# Patient Record
Sex: Female | Born: 1935 | Race: White | Hispanic: No | Smoking: Never smoker
Health system: Southern US, Community
[De-identification: ages and names within clinical notes are randomized; demographics above are authoritative.]

## PROBLEM LIST (undated history)

## (undated) DIAGNOSIS — D126 Benign neoplasm of colon, unspecified: Secondary | ICD-10-CM

## (undated) DIAGNOSIS — E785 Hyperlipidemia, unspecified: Secondary | ICD-10-CM

## (undated) DIAGNOSIS — R55 Syncope and collapse: Secondary | ICD-10-CM

## (undated) DIAGNOSIS — I10 Essential (primary) hypertension: Secondary | ICD-10-CM

## (undated) DIAGNOSIS — I447 Left bundle-branch block, unspecified: Secondary | ICD-10-CM

## (undated) DIAGNOSIS — I779 Disorder of arteries and arterioles, unspecified: Secondary | ICD-10-CM

## (undated) DIAGNOSIS — I2510 Atherosclerotic heart disease of native coronary artery without angina pectoris: Secondary | ICD-10-CM

## (undated) DIAGNOSIS — M810 Age-related osteoporosis without current pathological fracture: Secondary | ICD-10-CM

## (undated) DIAGNOSIS — G459 Transient cerebral ischemic attack, unspecified: Secondary | ICD-10-CM

## (undated) DIAGNOSIS — K5730 Diverticulosis of large intestine without perforation or abscess without bleeding: Secondary | ICD-10-CM

## (undated) DIAGNOSIS — E119 Type 2 diabetes mellitus without complications: Secondary | ICD-10-CM

## (undated) DIAGNOSIS — K219 Gastro-esophageal reflux disease without esophagitis: Secondary | ICD-10-CM

## (undated) DIAGNOSIS — D649 Anemia, unspecified: Secondary | ICD-10-CM

## (undated) DIAGNOSIS — K649 Unspecified hemorrhoids: Secondary | ICD-10-CM

## (undated) DIAGNOSIS — I739 Peripheral vascular disease, unspecified: Secondary | ICD-10-CM

## (undated) DIAGNOSIS — Z9289 Personal history of other medical treatment: Secondary | ICD-10-CM

## (undated) DIAGNOSIS — W19XXXA Unspecified fall, initial encounter: Secondary | ICD-10-CM

## (undated) DIAGNOSIS — S022XXB Fracture of nasal bones, initial encounter for open fracture: Secondary | ICD-10-CM

## (undated) DIAGNOSIS — I480 Paroxysmal atrial fibrillation: Secondary | ICD-10-CM

## (undated) DIAGNOSIS — I639 Cerebral infarction, unspecified: Secondary | ICD-10-CM

## (undated) DIAGNOSIS — M1990 Unspecified osteoarthritis, unspecified site: Secondary | ICD-10-CM

## (undated) HISTORY — PX: CLOSED REDUCTION METATARSAL FRACTURE: SUR228

## (undated) HISTORY — DX: Transient cerebral ischemic attack, unspecified: G45.9

## (undated) HISTORY — DX: Hyperlipidemia, unspecified: E78.5

## (undated) HISTORY — DX: Left bundle-branch block, unspecified: I44.7

## (undated) HISTORY — DX: Paroxysmal atrial fibrillation: I48.0

## (undated) HISTORY — DX: Syncope and collapse: R55

## (undated) HISTORY — DX: Benign neoplasm of colon, unspecified: D12.6

## (undated) HISTORY — DX: Type 2 diabetes mellitus without complications: E11.9

## (undated) HISTORY — DX: Peripheral vascular disease, unspecified: I73.9

## (undated) HISTORY — DX: Cerebral infarction, unspecified: I63.9

## (undated) HISTORY — DX: Atherosclerotic heart disease of native coronary artery without angina pectoris: I25.10

## (undated) HISTORY — DX: Unspecified osteoarthritis, unspecified site: M19.90

## (undated) HISTORY — DX: Gastro-esophageal reflux disease without esophagitis: K21.9

## (undated) HISTORY — DX: Diverticulosis of large intestine without perforation or abscess without bleeding: K57.30

## (undated) HISTORY — DX: Disorder of arteries and arterioles, unspecified: I77.9

## (undated) HISTORY — DX: Age-related osteoporosis without current pathological fracture: M81.0

## (undated) HISTORY — DX: Essential (primary) hypertension: I10

## (undated) HISTORY — PX: EYE SURGERY: SHX253

## (undated) HISTORY — PX: TONSILLECTOMY: SHX5217

## (undated) HISTORY — DX: Anemia, unspecified: D64.9

## (undated) HISTORY — DX: Personal history of other medical treatment: Z92.89

## (undated) HISTORY — DX: Unspecified hemorrhoids: K64.9

---

## 1997-06-15 ENCOUNTER — Other Ambulatory Visit: Admission: RE | Admit: 1997-06-15 | Discharge: 1997-06-15 | Payer: Self-pay | Admitting: Family Medicine

## 1997-08-17 ENCOUNTER — Other Ambulatory Visit: Admission: RE | Admit: 1997-08-17 | Discharge: 1997-08-17 | Payer: Self-pay | Admitting: Family Medicine

## 1998-08-14 ENCOUNTER — Other Ambulatory Visit: Admission: RE | Admit: 1998-08-14 | Discharge: 1998-08-14 | Payer: Self-pay | Admitting: Family Medicine

## 1998-09-21 ENCOUNTER — Encounter: Admission: RE | Admit: 1998-09-21 | Discharge: 1998-12-20 | Payer: Self-pay | Admitting: Family Medicine

## 1998-12-21 ENCOUNTER — Encounter: Admission: RE | Admit: 1998-12-21 | Discharge: 1999-03-21 | Payer: Self-pay | Admitting: Family Medicine

## 1999-02-12 HISTORY — PX: COLONOSCOPY W/ POLYPECTOMY: SHX1380

## 1999-03-26 ENCOUNTER — Encounter: Admission: RE | Admit: 1999-03-26 | Discharge: 1999-06-24 | Payer: Self-pay | Admitting: Family Medicine

## 2000-03-19 ENCOUNTER — Other Ambulatory Visit: Admission: RE | Admit: 2000-03-19 | Discharge: 2000-03-19 | Payer: Self-pay | Admitting: Internal Medicine

## 2000-09-29 ENCOUNTER — Encounter: Payer: Self-pay | Admitting: Ophthalmology

## 2000-09-30 ENCOUNTER — Ambulatory Visit (HOSPITAL_COMMUNITY): Admission: RE | Admit: 2000-09-30 | Discharge: 2000-09-30 | Payer: Self-pay | Admitting: Ophthalmology

## 2001-07-16 ENCOUNTER — Encounter (INDEPENDENT_AMBULATORY_CARE_PROVIDER_SITE_OTHER): Payer: Self-pay | Admitting: *Deleted

## 2001-07-21 ENCOUNTER — Encounter: Payer: Self-pay | Admitting: Internal Medicine

## 2001-07-21 ENCOUNTER — Encounter: Admission: RE | Admit: 2001-07-21 | Discharge: 2001-07-21 | Payer: Self-pay | Admitting: Internal Medicine

## 2003-12-19 ENCOUNTER — Ambulatory Visit: Payer: Self-pay | Admitting: Internal Medicine

## 2004-01-18 ENCOUNTER — Ambulatory Visit: Payer: Self-pay | Admitting: Internal Medicine

## 2004-05-07 ENCOUNTER — Ambulatory Visit: Payer: Self-pay | Admitting: Internal Medicine

## 2004-05-07 ENCOUNTER — Ambulatory Visit: Payer: Self-pay | Admitting: Cardiology

## 2004-05-08 ENCOUNTER — Ambulatory Visit: Payer: Self-pay

## 2004-05-24 ENCOUNTER — Ambulatory Visit: Payer: Self-pay | Admitting: Internal Medicine

## 2004-07-20 ENCOUNTER — Ambulatory Visit (HOSPITAL_COMMUNITY): Admission: RE | Admit: 2004-07-20 | Discharge: 2004-07-20 | Payer: Self-pay | Admitting: Ophthalmology

## 2004-07-24 ENCOUNTER — Ambulatory Visit: Payer: Self-pay | Admitting: Internal Medicine

## 2004-08-07 ENCOUNTER — Encounter (INDEPENDENT_AMBULATORY_CARE_PROVIDER_SITE_OTHER): Payer: Self-pay | Admitting: *Deleted

## 2004-08-07 ENCOUNTER — Encounter: Payer: Self-pay | Admitting: Internal Medicine

## 2004-08-07 ENCOUNTER — Ambulatory Visit: Payer: Self-pay | Admitting: Internal Medicine

## 2004-08-07 ENCOUNTER — Ambulatory Visit (HOSPITAL_COMMUNITY): Admission: RE | Admit: 2004-08-07 | Discharge: 2004-08-07 | Payer: Self-pay | Admitting: Internal Medicine

## 2004-12-26 ENCOUNTER — Ambulatory Visit: Payer: Self-pay | Admitting: Internal Medicine

## 2005-03-06 ENCOUNTER — Ambulatory Visit: Payer: Self-pay | Admitting: Internal Medicine

## 2005-03-13 ENCOUNTER — Encounter: Admission: RE | Admit: 2005-03-13 | Discharge: 2005-06-11 | Payer: Self-pay | Admitting: Internal Medicine

## 2005-06-13 ENCOUNTER — Ambulatory Visit: Payer: Self-pay | Admitting: Internal Medicine

## 2005-08-02 ENCOUNTER — Ambulatory Visit: Payer: Self-pay | Admitting: Internal Medicine

## 2005-09-02 ENCOUNTER — Ambulatory Visit: Payer: Self-pay | Admitting: Internal Medicine

## 2005-11-26 ENCOUNTER — Ambulatory Visit: Payer: Self-pay | Admitting: Internal Medicine

## 2005-12-20 ENCOUNTER — Ambulatory Visit: Payer: Self-pay | Admitting: Internal Medicine

## 2006-01-13 ENCOUNTER — Ambulatory Visit: Payer: Self-pay | Admitting: Internal Medicine

## 2006-01-13 LAB — CONVERTED CEMR LAB
ALT: 21 units/L (ref 0–40)
AST: 27 units/L (ref 0–37)
Albumin: 3.8 g/dL (ref 3.5–5.2)
Alkaline Phosphatase: 32 units/L — ABNORMAL LOW (ref 39–117)
BUN: 18 mg/dL (ref 6–23)
Bilirubin, Direct: 0.1 mg/dL (ref 0.0–0.3)
CO2: 28 meq/L (ref 19–32)
Calcium: 9.2 mg/dL (ref 8.4–10.5)
Chloride: 108 meq/L (ref 96–112)
Chol/HDL Ratio, serum: 2.8
Cholesterol: 139 mg/dL (ref 0–200)
Creatinine, Ser: 1.1 mg/dL (ref 0.4–1.2)
Creatinine,U: 97.5 mg/dL
GFR calc non Af Amer: 50 mL/min
Glomerular Filtration Rate, Af Am: 61 mL/min/{1.73_m2}
Glucose, Bld: 118 mg/dL — ABNORMAL HIGH (ref 70–99)
HDL: 50.1 mg/dL (ref >39.0)
Hgb A1c MFr Bld: 6.5 % — ABNORMAL HIGH (ref 4.6–6.0)
LDL Cholesterol: 73 mg/dL (ref 0–99)
Microalb Creat Ratio: 17.4 mg/g (ref 0.0–30.0)
Microalb, Ur: 1.7 mg/dL (ref 0.0–1.9)
Potassium: 4.1 meq/L (ref 3.5–5.1)
Sodium: 143 meq/L (ref 135–145)
Total Bilirubin: 1 mg/dL (ref 0.3–1.2)
Total Protein: 6.8 g/dL (ref 6.0–8.3)
Triglyceride fasting, serum: 80 mg/dL (ref 0–149)
VLDL: 16 mg/dL (ref 0–40)

## 2006-03-07 ENCOUNTER — Ambulatory Visit: Payer: Self-pay | Admitting: Internal Medicine

## 2006-03-07 LAB — CONVERTED CEMR LAB
Basophils Absolute: 0 10*3/uL (ref 0.0–0.1)
Basophils Relative: 0.3 % (ref 0.0–1.0)
Eosinophils Absolute: 0.2 10*3/uL (ref 0.0–0.6)
Eosinophils Relative: 3 % (ref 0.0–5.0)
HCT: 35.7 % — ABNORMAL LOW (ref 36.0–46.0)
Hemoglobin: 12.3 g/dL (ref 12.0–15.0)
Lymphocytes Relative: 33.1 % (ref 12.0–46.0)
MCHC: 34.4 g/dL (ref 30.0–36.0)
MCV: 97.9 fL (ref 78.0–100.0)
Monocytes Absolute: 0.6 10*3/uL (ref 0.2–0.7)
Monocytes Relative: 9.4 % (ref 3.0–11.0)
Neutro Abs: 3.1 10*3/uL (ref 1.4–7.7)
Neutrophils Relative %: 54.2 % (ref 43.0–77.0)
Platelets: 209 10*3/uL (ref 150–400)
RBC: 3.65 M/uL — ABNORMAL LOW (ref 3.87–5.11)
RDW: 13 % (ref 11.5–14.6)
TSH: 3.68 microintl units/mL (ref 0.35–5.50)
WBC: 5.9 10*3/uL (ref 4.5–10.5)

## 2006-05-26 ENCOUNTER — Ambulatory Visit: Payer: Self-pay | Admitting: Internal Medicine

## 2006-05-26 LAB — CONVERTED CEMR LAB
ALT: 22 units/L (ref 0–40)
AST: 26 units/L (ref 0–37)
Albumin: 4 g/dL (ref 3.5–5.2)
Alkaline Phosphatase: 33 units/L — ABNORMAL LOW (ref 39–117)
BUN: 22 mg/dL (ref 6–23)
Bilirubin, Direct: 0.1 mg/dL (ref 0.0–0.3)
CO2: 29 meq/L (ref 19–32)
Calcium: 9.8 mg/dL (ref 8.4–10.5)
Chloride: 111 meq/L (ref 96–112)
Cholesterol: 167 mg/dL (ref 0–200)
Creatinine, Ser: 1 mg/dL (ref 0.4–1.2)
GFR calc Af Amer: 68 mL/min
GFR calc non Af Amer: 56 mL/min
Glucose, Bld: 147 mg/dL — ABNORMAL HIGH (ref 70–99)
HDL: 53.3 mg/dL (ref >39.0)
Hgb A1c MFr Bld: 6.7 % — ABNORMAL HIGH (ref 4.6–6.0)
LDL Cholesterol: 91 mg/dL (ref 0–99)
Potassium: 4.8 meq/L (ref 3.5–5.1)
Sodium: 147 meq/L — ABNORMAL HIGH (ref 135–145)
Total Bilirubin: 0.9 mg/dL (ref 0.3–1.2)
Total CHOL/HDL Ratio: 3.1
Total Protein: 7.3 g/dL (ref 6.0–8.3)
Triglycerides: 116 mg/dL (ref 0–149)
VLDL: 23 mg/dL (ref 0–40)

## 2006-06-10 ENCOUNTER — Ambulatory Visit: Payer: Self-pay | Admitting: Internal Medicine

## 2006-08-05 ENCOUNTER — Ambulatory Visit: Payer: Self-pay | Admitting: Internal Medicine

## 2006-08-05 ENCOUNTER — Inpatient Hospital Stay (HOSPITAL_COMMUNITY): Admission: AD | Admit: 2006-08-05 | Discharge: 2006-08-07 | Payer: Self-pay | Admitting: Internal Medicine

## 2006-08-06 ENCOUNTER — Ambulatory Visit: Payer: Self-pay | Admitting: Internal Medicine

## 2006-08-06 ENCOUNTER — Encounter: Payer: Self-pay | Admitting: Internal Medicine

## 2006-08-11 DIAGNOSIS — M810 Age-related osteoporosis without current pathological fracture: Secondary | ICD-10-CM

## 2006-08-11 DIAGNOSIS — E119 Type 2 diabetes mellitus without complications: Secondary | ICD-10-CM

## 2006-08-11 DIAGNOSIS — K5730 Diverticulosis of large intestine without perforation or abscess without bleeding: Secondary | ICD-10-CM | POA: Insufficient documentation

## 2006-08-11 DIAGNOSIS — M1990 Unspecified osteoarthritis, unspecified site: Secondary | ICD-10-CM

## 2006-08-11 DIAGNOSIS — I447 Left bundle-branch block, unspecified: Secondary | ICD-10-CM

## 2006-08-11 HISTORY — DX: Age-related osteoporosis without current pathological fracture: M81.0

## 2006-08-11 HISTORY — DX: Diverticulosis of large intestine without perforation or abscess without bleeding: K57.30

## 2006-08-11 HISTORY — DX: Type 2 diabetes mellitus without complications: E11.9

## 2006-08-11 HISTORY — DX: Left bundle-branch block, unspecified: I44.7

## 2006-08-11 HISTORY — DX: Unspecified osteoarthritis, unspecified site: M19.90

## 2006-08-13 ENCOUNTER — Inpatient Hospital Stay (HOSPITAL_COMMUNITY): Admission: EM | Admit: 2006-08-13 | Discharge: 2006-08-14 | Payer: Self-pay | Admitting: Emergency Medicine

## 2006-08-13 ENCOUNTER — Encounter: Payer: Self-pay | Admitting: Internal Medicine

## 2006-08-13 ENCOUNTER — Encounter (INDEPENDENT_AMBULATORY_CARE_PROVIDER_SITE_OTHER): Payer: Self-pay | Admitting: *Deleted

## 2006-08-13 ENCOUNTER — Ambulatory Visit: Payer: Self-pay | Admitting: Vascular Surgery

## 2006-08-18 ENCOUNTER — Ambulatory Visit: Payer: Self-pay | Admitting: Internal Medicine

## 2006-08-19 ENCOUNTER — Telehealth: Payer: Self-pay | Admitting: Internal Medicine

## 2006-08-21 ENCOUNTER — Encounter: Admission: RE | Admit: 2006-08-21 | Discharge: 2006-10-29 | Payer: Self-pay | Admitting: Internal Medicine

## 2006-08-27 ENCOUNTER — Encounter: Admission: RE | Admit: 2006-08-27 | Discharge: 2006-08-28 | Payer: Self-pay | Admitting: Internal Medicine

## 2006-09-02 ENCOUNTER — Encounter: Payer: Self-pay | Admitting: Internal Medicine

## 2006-09-02 ENCOUNTER — Telehealth: Payer: Self-pay | Admitting: Internal Medicine

## 2006-09-02 DIAGNOSIS — G459 Transient cerebral ischemic attack, unspecified: Secondary | ICD-10-CM

## 2006-09-02 HISTORY — DX: Transient cerebral ischemic attack, unspecified: G45.9

## 2006-09-04 ENCOUNTER — Telehealth (INDEPENDENT_AMBULATORY_CARE_PROVIDER_SITE_OTHER): Payer: Self-pay | Admitting: *Deleted

## 2006-09-04 ENCOUNTER — Encounter: Payer: Self-pay | Admitting: Internal Medicine

## 2006-09-09 ENCOUNTER — Ambulatory Visit: Payer: Self-pay | Admitting: Internal Medicine

## 2006-09-09 DIAGNOSIS — E785 Hyperlipidemia, unspecified: Secondary | ICD-10-CM

## 2006-09-09 DIAGNOSIS — I10 Essential (primary) hypertension: Secondary | ICD-10-CM

## 2006-09-09 HISTORY — DX: Hyperlipidemia, unspecified: E78.5

## 2006-09-09 HISTORY — DX: Essential (primary) hypertension: I10

## 2006-09-10 LAB — CONVERTED CEMR LAB
ALT: 25 units/L (ref 0–35)
AST: 26 units/L (ref 0–37)
Albumin: 3.9 g/dL (ref 3.5–5.2)
Alkaline Phosphatase: 31 units/L — ABNORMAL LOW (ref 39–117)
BUN: 16 mg/dL (ref 6–23)
Bilirubin, Direct: 0.1 mg/dL (ref 0.0–0.3)
CO2: 26 meq/L (ref 19–32)
Calcium: 9.4 mg/dL (ref 8.4–10.5)
Chloride: 102 meq/L (ref 96–112)
Creatinine, Ser: 1 mg/dL (ref 0.4–1.2)
GFR calc Af Amer: 68 mL/min
GFR calc non Af Amer: 56 mL/min
Glucose, Bld: 146 mg/dL — ABNORMAL HIGH (ref 70–99)
Hgb A1c MFr Bld: 6.8 % — ABNORMAL HIGH (ref 4.6–6.0)
Potassium: 4.5 meq/L (ref 3.5–5.1)
Sodium: 138 meq/L (ref 135–145)
Total Bilirubin: 1.1 mg/dL (ref 0.3–1.2)
Total Protein: 6.7 g/dL (ref 6.0–8.3)

## 2006-09-22 ENCOUNTER — Telehealth: Payer: Self-pay | Admitting: Internal Medicine

## 2006-09-25 ENCOUNTER — Ambulatory Visit: Payer: Self-pay | Admitting: Internal Medicine

## 2006-09-25 LAB — CONVERTED CEMR LAB
Cholesterol, target level: 200 mg/dL
HDL goal, serum: 40 mg/dL
LDL Goal: 70 mg/dL

## 2006-10-02 ENCOUNTER — Encounter: Payer: Self-pay | Admitting: Internal Medicine

## 2006-10-15 ENCOUNTER — Ambulatory Visit: Payer: Self-pay

## 2006-10-21 ENCOUNTER — Ambulatory Visit: Payer: Self-pay | Admitting: Cardiology

## 2006-10-24 ENCOUNTER — Ambulatory Visit: Payer: Self-pay | Admitting: Internal Medicine

## 2006-11-06 ENCOUNTER — Ambulatory Visit: Payer: Self-pay | Admitting: Cardiology

## 2006-11-20 ENCOUNTER — Telehealth: Payer: Self-pay | Admitting: Internal Medicine

## 2006-11-21 ENCOUNTER — Ambulatory Visit: Payer: Self-pay | Admitting: Internal Medicine

## 2006-12-08 ENCOUNTER — Telehealth: Payer: Self-pay | Admitting: Internal Medicine

## 2006-12-12 ENCOUNTER — Ambulatory Visit: Payer: Self-pay | Admitting: Cardiology

## 2006-12-24 ENCOUNTER — Telehealth (INDEPENDENT_AMBULATORY_CARE_PROVIDER_SITE_OTHER): Payer: Self-pay | Admitting: *Deleted

## 2007-01-15 ENCOUNTER — Ambulatory Visit: Payer: Self-pay | Admitting: Internal Medicine

## 2007-01-15 LAB — CONVERTED CEMR LAB
ALT: 26 units/L (ref 0–35)
AST: 28 units/L (ref 0–37)
Albumin: 3.8 g/dL (ref 3.5–5.2)
Alkaline Phosphatase: 36 units/L — ABNORMAL LOW (ref 39–117)
Basophils Absolute: 0 10*3/uL (ref 0.0–0.1)
Basophils Relative: 0.2 % (ref 0.0–1.0)
Bilirubin, Direct: 0.2 mg/dL (ref 0.0–0.3)
Cholesterol: 140 mg/dL (ref 0–200)
Eosinophils Absolute: 0.2 10*3/uL (ref 0.0–0.6)
Eosinophils Relative: 3.6 % (ref 0.0–5.0)
HCT: 35.8 % — ABNORMAL LOW (ref 36.0–46.0)
HDL: 39.1 mg/dL (ref >39.0)
Hemoglobin: 12.2 g/dL (ref 12.0–15.0)
Hgb A1c MFr Bld: 6.9 % — ABNORMAL HIGH (ref 4.6–6.0)
LDL Cholesterol: 83 mg/dL (ref 0–99)
Lymphocytes Relative: 33.9 % (ref 12.0–46.0)
MCHC: 34.2 g/dL (ref 30.0–36.0)
MCV: 99.1 fL (ref 78.0–100.0)
Monocytes Absolute: 0.7 10*3/uL (ref 0.2–0.7)
Monocytes Relative: 10.3 % (ref 3.0–11.0)
Neutro Abs: 3.7 10*3/uL (ref 1.4–7.7)
Neutrophils Relative %: 52 % (ref 43.0–77.0)
Platelets: 246 10*3/uL (ref 150–400)
RBC: 3.61 M/uL — ABNORMAL LOW (ref 3.87–5.11)
RDW: 12.8 % (ref 11.5–14.6)
Total Bilirubin: 0.8 mg/dL (ref 0.3–1.2)
Total CHOL/HDL Ratio: 3.6
Total Protein: 6.7 g/dL (ref 6.0–8.3)
Triglycerides: 88 mg/dL (ref 0–149)
VLDL: 18 mg/dL (ref 0–40)
WBC: 6.9 10*3/uL (ref 4.5–10.5)

## 2007-02-16 ENCOUNTER — Ambulatory Visit: Payer: Self-pay | Admitting: Internal Medicine

## 2007-02-16 DIAGNOSIS — K219 Gastro-esophageal reflux disease without esophagitis: Secondary | ICD-10-CM

## 2007-02-16 HISTORY — DX: Gastro-esophageal reflux disease without esophagitis: K21.9

## 2007-02-27 ENCOUNTER — Telehealth: Payer: Self-pay | Admitting: Internal Medicine

## 2007-03-03 ENCOUNTER — Ambulatory Visit: Payer: Self-pay | Admitting: Cardiology

## 2007-05-12 ENCOUNTER — Ambulatory Visit: Payer: Self-pay | Admitting: Internal Medicine

## 2007-05-12 LAB — CONVERTED CEMR LAB
CO2: 29 meq/L (ref 19–32)
Chloride: 107 meq/L (ref 96–112)
GFR calc non Af Amer: 50 mL/min
Hgb A1c MFr Bld: 6.9 % — ABNORMAL HIGH (ref 4.6–6.0)
LDL Cholesterol: 73 mg/dL (ref 0–99)
Potassium: 4.7 meq/L (ref 3.5–5.1)
Sodium: 142 meq/L (ref 135–145)
TSH: 4.09 microintl units/mL (ref 0.35–5.50)
Total CHOL/HDL Ratio: 3.6
Vitamin B-12: 413 pg/mL (ref 211–911)

## 2007-05-14 ENCOUNTER — Ambulatory Visit: Payer: Self-pay | Admitting: Internal Medicine

## 2007-06-08 ENCOUNTER — Telehealth: Payer: Self-pay | Admitting: Internal Medicine

## 2007-06-12 ENCOUNTER — Ambulatory Visit: Payer: Self-pay | Admitting: Internal Medicine

## 2007-06-15 ENCOUNTER — Ambulatory Visit: Payer: Self-pay | Admitting: Internal Medicine

## 2007-06-22 ENCOUNTER — Ambulatory Visit: Payer: Self-pay | Admitting: Internal Medicine

## 2007-06-23 ENCOUNTER — Telehealth: Payer: Self-pay | Admitting: Internal Medicine

## 2007-06-23 LAB — CONVERTED CEMR LAB
AST: 36 units/L (ref 0–37)
Albumin: 3.6 g/dL (ref 3.5–5.2)
Alkaline Phosphatase: 34 units/L — ABNORMAL LOW (ref 39–117)
BUN: 20 mg/dL (ref 6–23)
CO2: 29 meq/L (ref 19–32)
Chloride: 107 meq/L (ref 96–112)
Eosinophils Absolute: 0.1 10*3/uL (ref 0.0–0.7)
Eosinophils Relative: 0.6 % (ref 0.0–5.0)
GFR calc non Af Amer: 56 mL/min
Lymphocytes Relative: 15.2 % (ref 12.0–46.0)
MCV: 98.5 fL (ref 78.0–100.0)
Monocytes Relative: 9.6 % (ref 3.0–12.0)
Neutrophils Relative %: 74.5 % (ref 43.0–77.0)
Platelets: 239 10*3/uL (ref 150–400)
Potassium: 4.9 meq/L (ref 3.5–5.1)
WBC: 11.1 10*3/uL — ABNORMAL HIGH (ref 4.5–10.5)

## 2007-07-23 ENCOUNTER — Encounter: Payer: Self-pay | Admitting: Internal Medicine

## 2007-08-18 ENCOUNTER — Ambulatory Visit: Payer: Self-pay

## 2007-08-25 ENCOUNTER — Ambulatory Visit: Payer: Self-pay | Admitting: Cardiology

## 2007-09-07 ENCOUNTER — Ambulatory Visit: Payer: Self-pay | Admitting: Internal Medicine

## 2007-09-07 LAB — CONVERTED CEMR LAB
Bilirubin, Direct: 0.1 mg/dL (ref 0.0–0.3)
CO2: 27 meq/L (ref 19–32)
Calcium: 9.5 mg/dL (ref 8.4–10.5)
GFR calc Af Amer: 68 mL/min
HDL: 42.8 mg/dL (ref >39.0)
Sodium: 143 meq/L (ref 135–145)
Total Bilirubin: 1 mg/dL (ref 0.3–1.2)
Total CHOL/HDL Ratio: 2.9
VLDL: 23 mg/dL (ref 0–40)

## 2007-09-14 ENCOUNTER — Ambulatory Visit: Payer: Self-pay | Admitting: Internal Medicine

## 2007-09-29 ENCOUNTER — Encounter: Payer: Self-pay | Admitting: Internal Medicine

## 2007-10-14 ENCOUNTER — Ambulatory Visit: Payer: Self-pay | Admitting: Family Medicine

## 2007-10-14 ENCOUNTER — Encounter: Payer: Self-pay | Admitting: Internal Medicine

## 2007-10-27 ENCOUNTER — Ambulatory Visit: Payer: Self-pay | Admitting: Cardiology

## 2007-11-17 ENCOUNTER — Ambulatory Visit: Payer: Self-pay | Admitting: Internal Medicine

## 2007-12-11 ENCOUNTER — Ambulatory Visit: Payer: Self-pay | Admitting: Internal Medicine

## 2007-12-11 LAB — CONVERTED CEMR LAB
AST: 27 units/L (ref 0–37)
Albumin: 3.7 g/dL (ref 3.5–5.2)
BUN: 17 mg/dL (ref 6–23)
Cholesterol: 138 mg/dL (ref 0–200)
Creatinine, Ser: 1 mg/dL (ref 0.4–1.2)
GFR calc Af Amer: 68 mL/min
GFR calc non Af Amer: 56 mL/min
Hgb A1c MFr Bld: 6.8 % — ABNORMAL HIGH (ref 4.6–6.0)
LDL Cholesterol: 77 mg/dL (ref 0–99)
Triglycerides: 90 mg/dL (ref 0–149)
VLDL: 18 mg/dL (ref 0–40)

## 2007-12-17 ENCOUNTER — Ambulatory Visit: Payer: Self-pay | Admitting: Internal Medicine

## 2008-01-28 ENCOUNTER — Ambulatory Visit: Payer: Self-pay | Admitting: Cardiology

## 2008-04-07 ENCOUNTER — Encounter: Payer: Self-pay | Admitting: Internal Medicine

## 2008-04-07 ENCOUNTER — Ambulatory Visit: Payer: Self-pay | Admitting: Internal Medicine

## 2008-04-07 LAB — CONVERTED CEMR LAB
ALT: 25 units/L (ref 0–35)
AST: 28 units/L (ref 0–37)
Bilirubin, Direct: 0.1 mg/dL (ref 0.0–0.3)
CO2: 29 meq/L (ref 19–32)
Calcium: 9.2 mg/dL (ref 8.4–10.5)
Chloride: 107 meq/L (ref 96–112)
Glucose, Bld: 151 mg/dL — ABNORMAL HIGH (ref 70–99)
HDL: 43.5 mg/dL (ref >39.0)
Sodium: 143 meq/L (ref 135–145)
Total Protein: 6.7 g/dL (ref 6.0–8.3)
Triglycerides: 101 mg/dL (ref 0–149)

## 2008-04-13 ENCOUNTER — Encounter: Payer: Self-pay | Admitting: Internal Medicine

## 2008-04-13 ENCOUNTER — Ambulatory Visit: Payer: Self-pay

## 2008-04-18 ENCOUNTER — Ambulatory Visit: Payer: Self-pay | Admitting: Internal Medicine

## 2008-04-27 DIAGNOSIS — I2510 Atherosclerotic heart disease of native coronary artery without angina pectoris: Secondary | ICD-10-CM | POA: Insufficient documentation

## 2008-04-27 HISTORY — DX: Atherosclerotic heart disease of native coronary artery without angina pectoris: I25.10

## 2008-05-04 ENCOUNTER — Encounter: Payer: Self-pay | Admitting: Cardiology

## 2008-05-04 ENCOUNTER — Ambulatory Visit: Payer: Self-pay | Admitting: Cardiology

## 2008-06-15 ENCOUNTER — Telehealth: Payer: Self-pay | Admitting: Internal Medicine

## 2008-07-25 ENCOUNTER — Encounter: Payer: Self-pay | Admitting: Internal Medicine

## 2008-08-17 ENCOUNTER — Ambulatory Visit: Payer: Self-pay | Admitting: Internal Medicine

## 2008-08-17 LAB — CONVERTED CEMR LAB
Alkaline Phosphatase: 34 units/L — ABNORMAL LOW (ref 39–117)
BUN: 21 mg/dL (ref 6–23)
Bilirubin, Direct: 0.1 mg/dL (ref 0.0–0.3)
CO2: 28 meq/L (ref 19–32)
Chloride: 107 meq/L (ref 96–112)
Cholesterol: 125 mg/dL (ref 0–200)
Creatinine, Ser: 1 mg/dL (ref 0.4–1.2)
Glucose, Bld: 132 mg/dL — ABNORMAL HIGH (ref 70–99)
LDL Cholesterol: 59 mg/dL (ref 0–99)
Potassium: 5.1 meq/L (ref 3.5–5.1)
Total Bilirubin: 1 mg/dL (ref 0.3–1.2)
Total CHOL/HDL Ratio: 3
Total Protein: 6.7 g/dL (ref 6.0–8.3)

## 2008-08-22 ENCOUNTER — Ambulatory Visit: Payer: Self-pay | Admitting: Internal Medicine

## 2008-08-23 ENCOUNTER — Ambulatory Visit: Payer: Self-pay | Admitting: Internal Medicine

## 2008-08-23 LAB — CONVERTED CEMR LAB
BUN: 20 mg/dL (ref 6–23)
CO2: 28 meq/L (ref 19–32)
Chloride: 103 meq/L (ref 96–112)
Creatinine, Ser: 1 mg/dL (ref 0.4–1.2)
Potassium: 4.9 meq/L (ref 3.5–5.1)

## 2008-08-25 ENCOUNTER — Ambulatory Visit: Payer: Self-pay | Admitting: Internal Medicine

## 2008-08-26 ENCOUNTER — Encounter: Payer: Self-pay | Admitting: Internal Medicine

## 2008-08-29 ENCOUNTER — Ambulatory Visit: Payer: Self-pay | Admitting: Cardiovascular Disease

## 2008-08-31 ENCOUNTER — Ambulatory Visit: Payer: Self-pay | Admitting: Cardiology

## 2008-09-05 ENCOUNTER — Ambulatory Visit: Payer: Self-pay | Admitting: Internal Medicine

## 2008-09-14 ENCOUNTER — Encounter: Payer: Self-pay | Admitting: Cardiology

## 2008-09-28 ENCOUNTER — Encounter: Payer: Self-pay | Admitting: Internal Medicine

## 2008-10-18 ENCOUNTER — Ambulatory Visit: Payer: Self-pay | Admitting: Internal Medicine

## 2008-10-19 ENCOUNTER — Ambulatory Visit: Payer: Self-pay | Admitting: Cardiology

## 2008-10-19 ENCOUNTER — Telehealth: Payer: Self-pay | Admitting: Internal Medicine

## 2008-11-09 ENCOUNTER — Ambulatory Visit: Payer: Self-pay | Admitting: Internal Medicine

## 2008-11-29 ENCOUNTER — Telehealth: Payer: Self-pay | Admitting: Internal Medicine

## 2008-12-20 ENCOUNTER — Ambulatory Visit: Payer: Self-pay | Admitting: Internal Medicine

## 2008-12-20 LAB — CONVERTED CEMR LAB
AST: 24 units/L (ref 0–37)
Albumin: 3.9 g/dL (ref 3.5–5.2)
Alkaline Phosphatase: 33 units/L — ABNORMAL LOW (ref 39–117)
BUN: 19 mg/dL (ref 6–23)
Bilirubin, Direct: 0 mg/dL (ref 0.0–0.3)
CO2: 27 meq/L (ref 19–32)
Calcium: 9.4 mg/dL (ref 8.4–10.5)
Chloride: 109 meq/L (ref 96–112)
Cholesterol: 122 mg/dL (ref 0–200)
Creatinine, Ser: 1 mg/dL (ref 0.4–1.2)
LDL Cholesterol: 59 mg/dL (ref 0–99)
Total CHOL/HDL Ratio: 3
Triglycerides: 83 mg/dL (ref 0.0–149.0)

## 2008-12-28 ENCOUNTER — Ambulatory Visit: Payer: Self-pay | Admitting: Internal Medicine

## 2009-01-16 ENCOUNTER — Telehealth: Payer: Self-pay | Admitting: Cardiology

## 2009-01-19 ENCOUNTER — Telehealth: Payer: Self-pay | Admitting: Internal Medicine

## 2009-01-24 ENCOUNTER — Encounter: Payer: Self-pay | Admitting: Internal Medicine

## 2009-01-24 ENCOUNTER — Telehealth: Payer: Self-pay | Admitting: Internal Medicine

## 2009-02-07 ENCOUNTER — Telehealth: Payer: Self-pay | Admitting: Internal Medicine

## 2009-02-24 ENCOUNTER — Telehealth: Payer: Self-pay | Admitting: Internal Medicine

## 2009-03-01 DIAGNOSIS — Z860100 Personal history of colon polyps, unspecified: Secondary | ICD-10-CM | POA: Insufficient documentation

## 2009-03-06 ENCOUNTER — Encounter (INDEPENDENT_AMBULATORY_CARE_PROVIDER_SITE_OTHER): Payer: Self-pay | Admitting: *Deleted

## 2009-03-06 ENCOUNTER — Ambulatory Visit: Payer: Self-pay | Admitting: Internal Medicine

## 2009-04-12 ENCOUNTER — Ambulatory Visit: Payer: Self-pay | Admitting: Cardiology

## 2009-04-12 DIAGNOSIS — I480 Paroxysmal atrial fibrillation: Secondary | ICD-10-CM

## 2009-04-12 HISTORY — DX: Paroxysmal atrial fibrillation: I48.0

## 2009-04-24 ENCOUNTER — Telehealth: Payer: Self-pay | Admitting: Internal Medicine

## 2009-04-27 ENCOUNTER — Ambulatory Visit: Payer: Self-pay | Admitting: Family Medicine

## 2009-05-05 ENCOUNTER — Ambulatory Visit: Payer: Self-pay | Admitting: Internal Medicine

## 2009-05-08 ENCOUNTER — Encounter: Payer: Self-pay | Admitting: Internal Medicine

## 2009-05-10 ENCOUNTER — Encounter: Payer: Self-pay | Admitting: Internal Medicine

## 2009-05-10 ENCOUNTER — Telehealth: Payer: Self-pay | Admitting: Internal Medicine

## 2009-05-15 ENCOUNTER — Ambulatory Visit: Payer: Self-pay | Admitting: Internal Medicine

## 2009-05-15 LAB — CONVERTED CEMR LAB
Alkaline Phosphatase: 36 units/L — ABNORMAL LOW (ref 39–117)
BUN: 18 mg/dL (ref 6–23)
Bilirubin, Direct: 0.1 mg/dL (ref 0.0–0.3)
CO2: 30 meq/L (ref 19–32)
Chloride: 104 meq/L (ref 96–112)
Cholesterol: 115 mg/dL (ref 0–200)
Creatinine, Ser: 1 mg/dL (ref 0.4–1.2)
Glucose, Bld: 115 mg/dL — ABNORMAL HIGH (ref 70–99)
Hgb A1c MFr Bld: 6.6 % — ABNORMAL HIGH (ref 4.6–6.5)
LDL Cholesterol: 49 mg/dL (ref 0–99)
Potassium: 4.5 meq/L (ref 3.5–5.1)
Total Bilirubin: 0.7 mg/dL (ref 0.3–1.2)
Total CHOL/HDL Ratio: 2
VLDL: 16.2 mg/dL (ref 0.0–40.0)

## 2009-05-22 ENCOUNTER — Ambulatory Visit: Payer: Self-pay | Admitting: Internal Medicine

## 2009-08-03 ENCOUNTER — Telehealth: Payer: Self-pay | Admitting: Internal Medicine

## 2009-08-30 LAB — HM COLONOSCOPY

## 2009-08-31 ENCOUNTER — Ambulatory Visit: Payer: Self-pay | Admitting: Internal Medicine

## 2009-09-18 ENCOUNTER — Ambulatory Visit: Payer: Self-pay | Admitting: Internal Medicine

## 2009-09-18 LAB — CONVERTED CEMR LAB
ALT: 21 units/L (ref 0–35)
Bilirubin, Direct: 0.1 mg/dL (ref 0.0–0.3)
Chloride: 105 meq/L (ref 96–112)
GFR calc non Af Amer: 60.57 mL/min (ref >60)
Glucose, Bld: 126 mg/dL — ABNORMAL HIGH (ref 70–99)
HDL: 42.8 mg/dL (ref >39.00)
Hgb A1c MFr Bld: 6.6 % — ABNORMAL HIGH (ref 4.6–6.5)
LDL Cholesterol: 71 mg/dL (ref 0–99)
Potassium: 4.9 meq/L (ref 3.5–5.1)
Sodium: 142 meq/L (ref 135–145)
Total Bilirubin: 0.9 mg/dL (ref 0.3–1.2)
Total CHOL/HDL Ratio: 3
VLDL: 24.4 mg/dL (ref 0.0–40.0)

## 2009-09-25 ENCOUNTER — Ambulatory Visit: Payer: Self-pay | Admitting: Internal Medicine

## 2009-09-26 ENCOUNTER — Telehealth: Payer: Self-pay | Admitting: Cardiology

## 2009-09-28 ENCOUNTER — Encounter: Payer: Self-pay | Admitting: Internal Medicine

## 2009-11-02 ENCOUNTER — Encounter: Payer: Self-pay | Admitting: Internal Medicine

## 2009-11-17 ENCOUNTER — Ambulatory Visit: Payer: Self-pay | Admitting: Internal Medicine

## 2009-11-27 ENCOUNTER — Ambulatory Visit: Payer: Self-pay | Admitting: Cardiology

## 2009-11-27 ENCOUNTER — Encounter: Payer: Self-pay | Admitting: Cardiology

## 2009-11-27 DIAGNOSIS — R5383 Other fatigue: Secondary | ICD-10-CM

## 2009-11-27 DIAGNOSIS — R5381 Other malaise: Secondary | ICD-10-CM

## 2009-11-28 ENCOUNTER — Telehealth: Payer: Self-pay | Admitting: Cardiology

## 2009-11-29 LAB — CONVERTED CEMR LAB
BUN: 20 mg/dL (ref 6–23)
Basophils Relative: 0.1 % (ref 0.0–3.0)
CO2: 26 meq/L (ref 19–32)
Chloride: 101 meq/L (ref 96–112)
Eosinophils Relative: 1.2 % (ref 0.0–5.0)
HCT: 29.5 % — ABNORMAL LOW (ref 36.0–46.0)
Lymphs Abs: 1.9 10*3/uL (ref 0.7–4.0)
MCHC: 34.6 g/dL (ref 30.0–36.0)
MCV: 99.7 fL (ref 78.0–100.0)
Monocytes Absolute: 1.2 10*3/uL — ABNORMAL HIGH (ref 0.1–1.0)
Potassium: 4.6 meq/L (ref 3.5–5.1)
RBC: 2.96 M/uL — ABNORMAL LOW (ref 3.87–5.11)
WBC: 10.9 10*3/uL — ABNORMAL HIGH (ref 4.5–10.5)

## 2009-11-30 ENCOUNTER — Ambulatory Visit: Payer: Self-pay | Admitting: Family Medicine

## 2009-11-30 ENCOUNTER — Telehealth: Payer: Self-pay | Admitting: Cardiology

## 2009-11-30 DIAGNOSIS — D649 Anemia, unspecified: Secondary | ICD-10-CM | POA: Insufficient documentation

## 2009-11-30 HISTORY — DX: Anemia, unspecified: D64.9

## 2009-12-01 ENCOUNTER — Ambulatory Visit: Payer: Self-pay | Admitting: Internal Medicine

## 2009-12-04 LAB — CONVERTED CEMR LAB
Basophils Absolute: 0 10*3/uL (ref 0.0–0.1)
Eosinophils Absolute: 0.2 10*3/uL (ref 0.0–0.7)
HCT: 29.4 % — ABNORMAL LOW (ref 36.0–46.0)
Lymphs Abs: 1.8 10*3/uL (ref 0.7–4.0)
MCHC: 34.3 g/dL (ref 30.0–36.0)
MCV: 99.1 fL (ref 78.0–100.0)
Monocytes Absolute: 0.7 10*3/uL (ref 0.1–1.0)
Platelets: 260 10*3/uL (ref 150.0–400.0)
RDW: 13 % (ref 11.5–14.6)

## 2009-12-08 ENCOUNTER — Ambulatory Visit: Payer: Self-pay | Admitting: Internal Medicine

## 2009-12-13 ENCOUNTER — Ambulatory Visit: Payer: Self-pay | Admitting: Internal Medicine

## 2009-12-20 ENCOUNTER — Encounter: Payer: Self-pay | Admitting: Family Medicine

## 2009-12-20 ENCOUNTER — Ambulatory Visit: Payer: Self-pay | Admitting: Family Medicine

## 2009-12-20 ENCOUNTER — Telehealth: Payer: Self-pay | Admitting: Family Medicine

## 2009-12-21 LAB — CONVERTED CEMR LAB
Calcium: 9.5 mg/dL (ref 8.4–10.5)
Folate: >20 ng/mL
GFR calc non Af Amer: 60.53 mL/min (ref >60)
Glucose, Bld: 148 mg/dL — ABNORMAL HIGH (ref 70–99)
Saturation Ratios: 26.9 % (ref 20.0–50.0)
Sodium: 138 meq/L (ref 135–145)
Vitamin B-12: 1037 pg/mL — ABNORMAL HIGH (ref 211–911)

## 2009-12-25 ENCOUNTER — Telehealth: Payer: Self-pay | Admitting: Internal Medicine

## 2009-12-26 ENCOUNTER — Ambulatory Visit: Payer: Self-pay | Admitting: Gastroenterology

## 2009-12-26 DIAGNOSIS — D126 Benign neoplasm of colon, unspecified: Secondary | ICD-10-CM

## 2009-12-26 DIAGNOSIS — K5900 Constipation, unspecified: Secondary | ICD-10-CM | POA: Insufficient documentation

## 2009-12-26 DIAGNOSIS — K648 Other hemorrhoids: Secondary | ICD-10-CM

## 2009-12-26 DIAGNOSIS — K644 Residual hemorrhoidal skin tags: Secondary | ICD-10-CM | POA: Insufficient documentation

## 2009-12-26 HISTORY — DX: Benign neoplasm of colon, unspecified: D12.6

## 2009-12-27 ENCOUNTER — Telehealth: Payer: Self-pay | Admitting: Nurse Practitioner

## 2010-01-08 ENCOUNTER — Telehealth: Payer: Self-pay | Admitting: Internal Medicine

## 2010-01-09 ENCOUNTER — Encounter (INDEPENDENT_AMBULATORY_CARE_PROVIDER_SITE_OTHER): Payer: Self-pay | Admitting: *Deleted

## 2010-01-22 ENCOUNTER — Ambulatory Visit: Payer: Self-pay | Admitting: Family Medicine

## 2010-01-24 ENCOUNTER — Telehealth: Payer: Self-pay | Admitting: Family Medicine

## 2010-01-24 DIAGNOSIS — M25569 Pain in unspecified knee: Secondary | ICD-10-CM

## 2010-01-25 LAB — CONVERTED CEMR LAB
Basophils Absolute: 0 10*3/uL (ref 0.0–0.1)
Basophils Relative: 0.3 % (ref 0.0–3.0)
Eosinophils Absolute: 0.1 10*3/uL (ref 0.0–0.7)
HCT: 33.4 % — ABNORMAL LOW (ref 36.0–46.0)
Hemoglobin: 11.4 g/dL — ABNORMAL LOW (ref 12.0–15.0)
Lymphs Abs: 2.1 10*3/uL (ref 0.7–4.0)
MCHC: 34.1 g/dL (ref 30.0–36.0)
MCV: 99.2 fL (ref 78.0–100.0)
Monocytes Absolute: 0.5 10*3/uL (ref 0.1–1.0)
Neutro Abs: 2.8 10*3/uL (ref 1.4–7.7)
RBC: 3.37 M/uL — ABNORMAL LOW (ref 3.87–5.11)
RDW: 14.3 % (ref 11.5–14.6)

## 2010-02-08 ENCOUNTER — Telehealth: Payer: Self-pay | Admitting: Family Medicine

## 2010-02-09 ENCOUNTER — Encounter: Payer: Self-pay | Admitting: Family Medicine

## 2010-02-09 ENCOUNTER — Ambulatory Visit
Admission: RE | Admit: 2010-02-09 | Discharge: 2010-02-09 | Payer: Self-pay | Source: Home / Self Care | Attending: Internal Medicine | Admitting: Internal Medicine

## 2010-02-14 ENCOUNTER — Encounter
Admission: RE | Admit: 2010-02-14 | Discharge: 2010-03-13 | Payer: Self-pay | Source: Home / Self Care | Attending: Family Medicine | Admitting: Family Medicine

## 2010-02-19 ENCOUNTER — Encounter: Payer: Self-pay | Admitting: Family Medicine

## 2010-03-05 ENCOUNTER — Telehealth: Payer: Self-pay | Admitting: Cardiology

## 2010-03-07 ENCOUNTER — Ambulatory Visit
Admission: RE | Admit: 2010-03-07 | Discharge: 2010-03-07 | Payer: Self-pay | Source: Home / Self Care | Attending: Family Medicine | Admitting: Family Medicine

## 2010-03-07 ENCOUNTER — Encounter: Payer: Self-pay | Admitting: Family Medicine

## 2010-03-07 ENCOUNTER — Other Ambulatory Visit: Payer: Self-pay | Admitting: Family Medicine

## 2010-03-07 LAB — CBC WITH DIFFERENTIAL/PLATELET
Eosinophils Relative: 1.6 % (ref 0.0–5.0)
HCT: 34.5 % — ABNORMAL LOW (ref 36.0–46.0)
Lymphocytes Relative: 30.2 % (ref 12.0–46.0)
Monocytes Relative: 8.6 % (ref 3.0–12.0)
Neutrophils Relative %: 59.3 % (ref 43.0–77.0)
Platelets: 203 10*3/uL (ref 150.0–400.0)
WBC: 7.2 10*3/uL (ref 4.5–10.5)

## 2010-03-11 LAB — CONVERTED CEMR LAB
Basophils Relative: 0.2 % (ref 0.0–3.0)
Eosinophils Absolute: 0.1 10*3/uL (ref 0.0–0.7)
HCT: 34.9 % — ABNORMAL LOW (ref 36.0–46.0)
Lymphs Abs: 1.9 10*3/uL (ref 0.7–4.0)
MCHC: 33.8 g/dL (ref 30.0–36.0)
MCV: 98.2 fL (ref 78.0–100.0)
Monocytes Absolute: 0.6 10*3/uL (ref 0.1–1.0)
Neutrophils Relative %: 59.6 % (ref 43.0–77.0)
Platelets: 206 10*3/uL (ref 150.0–400.0)

## 2010-03-15 NOTE — Assessment & Plan Note (Signed)
Summary: f51m   Visit Type:  6 mo f/u Primary Provider:  Birdie Sons MD  CC:  fatigue...denies any edema or cp....  History of Present Illness: Patient is staying busy.  Sometimes she does things, and sometimes she does not.  Overall, she feels well.  She recently raked leaves, dug dirt;   Has tremendous gas and diahreaa, and is scheduled for a colon exam on March 25.  Pt on plavix because of stroke.    Current Medications (verified): 1)  Zolpidem Tartrate 10 Mg Tabs (Zolpidem Tartrate) .... Take 1 Tablet By Mouth At Bedtime 2)  Simvastatin 40 Mg Tabs (Simvastatin) .... Take One Tablet At Bedtime 3)  Metoprolol Tartrate 25 Mg Tabs (Metoprolol Tartrate) .... 1/2  Tablet By Mouth Twice A Day 4)  Plavix 75 Mg Tabs (Clopidogrel Bisulfate) .... Take 1 Tablet By Mouth Once A Day 5)  Pantoprazole Sodium 40 Mg  Tbec (Pantoprazole Sodium) .... Take 1/2 Tablet Daily 6)  Fish Oil 1000 Mg Caps (Omega-3 Fatty Acids) .... Once Daily 7)  Aspirin 81 Mg  Tbec (Aspirin) .... Once Daily 8)  Multivitamins   Tabs (Multiple Vitamin) .... Once Daily 9)  Vitamin C Cr 500 Mg  Cr-Caps (Ascorbic Acid) .... Once Daily 10)  Cinnamon 500 Mg  Caps (Cinnamon) .... Once Daily 11)  Vitamin B-6 100 Mg  Tabs (Pyridoxine Hcl) .... Once Daily 12)  Caltrate 600+d 600-400 Mg-Unit Tabs (Calcium Carbonate-Vitamin D) .... Take 1 Tablet By Mouth Two Times A Day 13)  Vitamin D 400 Unit Tabs (Cholecalciferol) .... Take 1 Tablet By Mouth Once A Day 14)  Vitamin E-400 400 Unit Caps (Vitamin E) .... Take 1 Capsule By Mouth Once A Day 15)  Ocuvite  Tabs (Multiple Vitamins-Minerals) .... Take 1 Tablet By Mouth Once A Day 16)  Glucosamine-Msm 500-400 Mg Caps (Glucosamine Sulfate-Msm) .... Take 1 Capsule By Mouth Two Times A Day 17)  Vitamin B-12 500 Mcg  Tabs (Cyanocobalamin) .... Take 1 Tablet By Mouth Once A Day 18)  Ginkoba 40 Mg Tabs (Ginkgo Biloba) .... Once Daily 19)  Benefiber  Powd (Wheat Dextrin) .... Use 1 Heaping Teaspoon  Daily 20)  Bentyl 10 Mg Caps (Dicyclomine Hcl) .... Take 1 Capsule By Mouth Every Morning and As Needed in The Afternoon.  Allergies: 1)  ! Doxycycline Hyclate (Doxycycline Hyclate) 2)  ! Sulfa 3)  Macrodantin (Nitrofurantoin Macrocrystal) 4)  Septra (Sulfamethoxazole-Trimethoprim)  Vital Signs:  Patient profile:   77 year old female Height:      66 inches Weight:      133 pounds Pulse rate:   55 / minute Pulse rhythm:   irregular BP sitting:   146 / 80  (left arm) Cuff size:   large  Vitals Entered By: Danielle Rankin, CMA (April 12, 2009 11:46 AM)  Physical Exam  General:  Well developed, well nourished, in no acute distress. Lungs:  Clear bilaterally to auscultation and percussion. Heart:  Split S2.  Widely split S2.  No murmurs.   Extremities:  No clubbing or cyanosis. Neurologic:  Alert and oriented x 3.   EKG  Procedure date:  04/12/2009  Findings:      NSR.  LBBB.  Impression & Recommendations:  Problem # 1:  CAD, NATIVE VESSEL (ICD-414.01)  Continues to remains stable. Her updated medication list for this problem includes:    Metoprolol Tartrate 25 Mg Tabs (Metoprolol tartrate) .Marland Kitchen... 1/2  tablet by mouth twice a day    Plavix 75 Mg Tabs (  Clopidogrel bisulfate) .Marland Kitchen... Take 1 tablet by mouth once a day    Aspirin 81 Mg Tbec (Aspirin) ..... Once daily  Orders: EKG w/ Interpretation (93000)  Problem # 2:  HYPERTENSION (ICD-401.9)  controlled Her updated medication list for this problem includes:    Metoprolol Tartrate 25 Mg Tabs (Metoprolol tartrate) .Marland Kitchen... 1/2  tablet by mouth twice a day    Aspirin 81 Mg Tbec (Aspirin) ..... Once daily  Orders: EKG w/ Interpretation (93000)  Problem # 3:  HYPERLIPIDEMIA (ICD-272.4)  followed by Dr. Cato Mulligan. Her updated medication list for this problem includes:    Simvastatin 40 Mg Tabs (Simvastatin) .Marland Kitchen... Take one tablet at bedtime  Orders: EKG w/ Interpretation (93000)  Problem # 4:  ATRIAL FLUTTER  (ICD-427.32)  Had on monitor only.  Some increased risk, but hemmorhoids, so medical treatment as prescribed.  Now more than three years out. Her updated medication list for this problem includes:    Metoprolol Tartrate 25 Mg Tabs (Metoprolol tartrate) .Marland Kitchen... 1/2  tablet by mouth twice a day    Plavix 75 Mg Tabs (Clopidogrel bisulfate) .Marland Kitchen... Take 1 tablet by mouth once a day    Aspirin 81 Mg Tbec (Aspirin) ..... Once daily  Orders: EKG w/ Interpretation (93000)  Patient Instructions: 1)  Your physician recommends that you continue on your current medications as directed. Please refer to the Current Medication list given to you today. 2)  Your physician wants you to follow-up in:   1 YEAR. You will receive a reminder letter in the mail two months in advance. If you don't receive a letter, please call our office to schedule the follow-up appointment.

## 2010-03-15 NOTE — Assessment & Plan Note (Signed)
Summary: congestion//ccm   Vital Signs:  Patient profile:   77 year old female Temp:     98.6 degrees F oral BP sitting:   120 / 58  (left arm) Cuff size:   regular  Vitals Entered By: Sid Falcon LPN (April 27, 2009 9:28 AM) CC: Congestion   History of Present Illness: Acute visit. Patient developed upper respiratory type symptoms last week now has increased cough with yellow sputum. No dyspnea, fever, chills or pleuritic pain. Continues to have some postnasal drip symptoms. Nonsmoker. No history of asthma. Taking over-the-counter Mucinex with minimal relief. Felt feverish couple times but has not taken temperature.  Allergies: 1)  ! Doxycycline Hyclate (Doxycycline Hyclate) 2)  ! Sulfa 3)  Macrodantin (Nitrofurantoin Macrocrystal) 4)  Septra (Sulfamethoxazole-Trimethoprim)  Past History:  Past Medical History: Last updated: 04/07/2009 Current Problems:  CAD, NATIVE VESSEL (ICD-414.01) HYPERTENSION (ICD-401.9) HYPERLIPIDEMIA (ICD-272.4) TIA (ICD-435.9) GERD (ICD-530.81) DIABETES MELLITUS, TYPE II (ICD-250.00) INTERNAL HEMORRHOIDS (ICD-455.0) COLONIC POLYPS, HYPERPLASTIC, HX OF (ICD-V12.72) Family Hx of COLON CANCER (ICD-153.9) FOOT PAIN (ICD-729.5) LEFT BUNDLE BRANCH BLOCK (ICD-426.3) OSTEOPOROSIS (ICD-733.00) OSTEOARTHRITIS (ICD-715.90) DIVERTICULOSIS, COLON (ICD-562.10)  Social History: Last updated: 04/07/2009 lives alone 3 kids-all healthy Never Smoked Regular exercise-no Alcohol Use - yes-occasional wine PMH reviewed for relevance, PSH reviewed for relevance  Review of Systems      See HPI  Physical Exam  General:  Well-developed,well-nourished,in no acute distress; alert,appropriate and cooperative throughout examination Ears:  External ear exam shows no significant lesions or deformities.  Otoscopic examination reveals clear canals, tympanic membranes are intact bilaterally without bulging, retraction, inflammation or discharge. Hearing is grossly  normal bilaterally. Mouth:  Oral mucosa and oropharynx without lesions or exudates.  Teeth in good repair. Neck:  No deformities, masses, or tenderness noted. Lungs:  Normal respiratory effort, chest expands symmetrically. Lungs are clear to auscultation, no crackles or wheezes. Heart:  Normal rate and regular rhythm. S1 and S2 normal without gallop, murmur, click, rub or other extra sounds.   Impression & Recommendations:  Problem # 1:  ACUTE BRONCHITIS (ICD-466.0) Assessment New OTC mucinex and start Z-max given her age and productive cough. Her updated medication list for this problem includes:    Azithromycin 250 Mg Tabs (Azithromycin) .Marland Kitchen... 2 by mouth today then one by mouth once daily for 4 days  Complete Medication List: 1)  Zolpidem Tartrate 10 Mg Tabs (Zolpidem tartrate) .... Take 1 tablet by mouth at bedtime 2)  Simvastatin 40 Mg Tabs (Simvastatin) .... Take one tablet at bedtime 3)  Metoprolol Tartrate 25 Mg Tabs (Metoprolol tartrate) .... 1/2  tablet by mouth twice a day 4)  Plavix 75 Mg Tabs (Clopidogrel bisulfate) .... Take 1 tablet by mouth once a day 5)  Pantoprazole Sodium 40 Mg Tbec (Pantoprazole sodium) .... Take 1/2 tablet daily 6)  Fish Oil 1000 Mg Caps (Omega-3 fatty acids) .... Once daily 7)  Aspirin 81 Mg Tbec (Aspirin) .... Once daily 8)  Multivitamins Tabs (Multiple vitamin) .... Once daily 9)  Vitamin C Cr 500 Mg Cr-caps (Ascorbic acid) .... Once daily 10)  Cinnamon 500 Mg Caps (Cinnamon) .... Once daily 11)  Vitamin B-6 100 Mg Tabs (Pyridoxine hcl) .... Once daily 12)  Caltrate 600+d 600-400 Mg-unit Tabs (Calcium carbonate-vitamin d) .... Take 1 tablet by mouth two times a day 13)  Vitamin D 400 Unit Tabs (Cholecalciferol) .... Take 1 tablet by mouth once a day 14)  Vitamin E-400 400 Unit Caps (Vitamin e) .... Take 1 capsule by mouth once a  day 15)  Ocuvite Tabs (Multiple vitamins-minerals) .... Take 1 tablet by mouth once a day 16)  Glucosamine-msm 500-400  Mg Caps (Glucosamine sulfate-msm) .... Take 1 capsule by mouth two times a day 17)  Vitamin B-12 500 Mcg Tabs (Cyanocobalamin) .... Take 1 tablet by mouth once a day 18)  Ginkoba 40 Mg Tabs (Ginkgo biloba) .... Once daily 19)  Benefiber Powd (Wheat dextrin) .... Use 1 heaping teaspoon daily 20)  Bentyl 10 Mg Caps (Dicyclomine hcl) .... Take 1 capsule by mouth every morning and as needed in the afternoon. 21)  Azithromycin 250 Mg Tabs (Azithromycin) .... 2 by mouth today then one by mouth once daily for 4 days  Patient Instructions: 1)  Followup promptly if you develop any fever or worsening cough. 2)  Acute Bronchitis symptoms for less then 10 days are not  helped by antibiotics. Take over the counter cough medications. Call if no improvement in 5-7 days, sooner if increasing cough, fever, or new symptoms ( shortness of breath, chest pain) .  Prescriptions: AZITHROMYCIN 250 MG TABS (AZITHROMYCIN) 2 by mouth today then one by mouth once daily for 4 days  #6 x 0   Entered and Authorized by:   [name] MD   Signed by:   [name] MD on 04/27/2009   Method used:   Electronically to        [facility]  [phone]* (retail)       [address]       [place], [place]  [address]       Ph: [phone] or [phone]       Fax: [phone]   RxID:   [id number]

## 2010-03-15 NOTE — Letter (Signed)
Summary: Eye Exam/[facility]  Eye Exam/[facility]   Imported By: Maryln Gottron 12/27/2009 12:16:46  _____________________________________________________________________  External Attachment:    Type:   Image     Comment:   External Document

## 2010-03-15 NOTE — Assessment & Plan Note (Signed)
Summary: COUGH, CONGESTION // RS   Vital Signs:  Patient profile:   77 year old female Weight:      132 pounds Temp:     97.6 degrees F oral Pulse rate:   78 / minute Pulse rhythm:   regular Resp:     16 per minute BP sitting:   166 / 60  Vitals Entered By: Lynann Beaver CMA AAMA (December 01, 2009 9:34 AM) CC: Pt does ot want to be interviewed Is Patient Diabetic? Yes Pain Assessment Patient in pain? no        Primary Care Jaykub Mackins:  Birdie Sons MD  CC:  Pt does ot want to be interviewed.  History of Present Illness: Reviewed dr Leanne Chang note She continues to complain of cough. Denies SOB but states that when she takes a deep breath that it triggers a cough she started avelox yesterday she tells me that she was seen at [facility] Saturday---given Zpack  feels some fatigue    Current Medications (verified): 1)  Zolpidem Tartrate 10 Mg Tabs (Zolpidem Tartrate) .... Take 1 Tablet By Mouth At Bedtime 2)  Simvastatin 40 Mg Tabs (Simvastatin) .... Take One Tablet At Bedtime 3)  Metoprolol Tartrate 25 Mg Tabs (Metoprolol Tartrate) .... 1/2  Tablet By Mouth Twice A Day 4)  Plavix 75 Mg Tabs (Clopidogrel Bisulfate) .... Take 1 Tablet By Mouth Once A Day 5)  Pantoprazole Sodium 40 Mg  Tbec (Pantoprazole Sodium) .... Take 1/2 Tablet Daily 6)  Aspirin 81 Mg  Tbec (Aspirin) .... Once Daily 7)  Ocuvite  Tabs (Multiple Vitamins-Minerals) .... Take 1 Tablet By Mouth Once A Day 8)  Lortab 7.5-500 Mg Tabs (Hydrocodone-Acetaminophen) .... As Needed 9)  Delsym 30 Mg/70ml Lqcr (Dextromethorphan Polistirex) .... As Needed  Allergies (verified): 1)  ! Doxycycline Hyclate (Doxycycline Hyclate) 2)  ! Sulfa 3)  Macrodantin (Nitrofurantoin Macrocrystal) 4)  Septra  Physical Exam  General:  Well-developed,well-nourished,in no acute distress; alert,appropriate and cooperative throughout examination  She is coughing some during exam. Neck:  No deformities, masses, or tenderness noted. Lungs:   normal respiratory effort, no intercostal retractions, no accessory muscle use, and normal breath sounds except few rhonchi bilaterally.     Impression & Recommendations:  Problem # 1:  ACUTE POSTHEMORRHAGIC ANEMIA (ICD-285.1) note dectreased hgb after epistaxis sill need to follow  Problem # 2:  ACUTE BRONCHITIS (ICD-466.0) continue avelox The following medications were removed from the medication list:    Zithromax 1 Gm Pack (Azithromycin) .Marland Kitchen... As directed Her updated medication list for this problem includes:    Delsym 30 Mg/53ml Lqcr (Dextromethorphan polistirex) .Marland Kitchen... As needed    Avelox 400 Mg Tabs (Moxifloxacin hcl) .Marland Kitchen... 1 tablet by mouth daily  Complete Medication List: 1)  Zolpidem Tartrate 10 Mg Tabs (Zolpidem tartrate) .... Take 1 tablet by mouth at bedtime 2)  Simvastatin 40 Mg Tabs (Simvastatin) .... Take one tablet at bedtime 3)  Metoprolol Tartrate 25 Mg Tabs (Metoprolol tartrate) .... 1/2  tablet by mouth twice a day 4)  Plavix 75 Mg Tabs (Clopidogrel bisulfate) .... Take 1 tablet by mouth once a day 5)  Pantoprazole Sodium 40 Mg Tbec (Pantoprazole sodium) .... Take 1/2 tablet daily 6)  Aspirin 81 Mg Tbec (Aspirin) .... Once daily 7)  Ocuvite Tabs (Multiple vitamins-minerals) .... Take 1 tablet by mouth once a day 8)  Lortab 7.5-500 Mg Tabs (Hydrocodone-acetaminophen) .... As needed 9)  Delsym 30 Mg/8ml Lqcr (Dextromethorphan polistirex) .... As needed 10)  Avelox 400 Mg Tabs (Moxifloxacin  hcl) .... 1 tablet by mouth daily  Patient Instructions: 1)  see me next week to f/u bronchitis Prescriptions: AVELOX 400 MG  TABS (MOXIFLOXACIN HCL) 1 tablet by mouth daily  #5 x 0   Entered and Authorized by:   Birdie Sons MD   Signed by:   Birdie Sons MD on 12/01/2009   Method used:   Historical   RxID:   [id number]    Orders Added: 1)  Est. Patient Level III [56213]

## 2010-03-15 NOTE — Assessment & Plan Note (Signed)
Summary: PALPITATIONS ON SUNDAY AM/CJR   Vital Signs:  Patient profile:   77 year old female Weight:      129 pounds Temp:     97.5 degrees F oral Pulse rate:   72 / minute Pulse rhythm:   regular Resp:     12  per minute BP sitting:   122 / 58  (left arm) Cuff size:   regular  Vitals Entered By: Sid Falcon LPN (March 07, 2010 9:33 AM)  History of Present Illness: Patient seen with one episode Sunday morning around 3 AM of palpitations  Patient was having difficulty sleeping. She took an Ambien. Never went to sleep. Around 3 AM at about 10 minute episode of rapid heart rate but for the most part regular. She denies any chest pain, dizziness, or dyspnea. Prior history of transient atrial flutter. No recurrent episodes since Sunday morning.  Denied any caffeine or decongestant use. No alcohol that night.  Pt complaining of fatigue.  Normocytic anemia and pt requesting repeat CBC. Iron studies normal.  TSH normal by recent labs.  Chronic insomnia, requesting refills Ambien.  Sleep hygiene discussed.  Allergies: 1)  ! Doxycycline Hyclate (Doxycycline Hyclate) 2)  ! Sulfa 3)  Macrodantin (Nitrofurantoin Macrocrystal) 4)  Septra  Past History:  Past Medical History: Last updated: 12/26/2009 CAD, NATIVE VESSEL (ICD-414.01) HYPERTENSION (ICD-401.9) HYPERLIPIDEMIA (ICD-272.4) TIA (ICD-435.9) GERD (ICD-530.81) DIABETES MELLITUS, TYPE II (ICD-250.00) INTERNAL HEMORRHOIDS (ICD-455.0) COLONIC POLYPS, ADENOMATOUS Family Hx of COLON CANCER (ICD-153.9) FOOT PAIN (ICD-729.5) LEFT BUNDLE BRANCH BLOCK (ICD-426.3) OSTEOPOROSIS (ICD-733.00) OSTEOARTHRITIS (ICD-715.90) DIVERTICULOSIS, COLON (ICD-562.10)  Past Surgical History: Last updated: 03/01/2009 Tonsillectomy colon   2001 Cataract Extraction Right 5th metatarsal repair  Family History: Last updated: 04/07/2009 mother MI 80 father 93-CHF sister with breast CA Family History of Colon Cancer: Brother Family History  of Heart Disease: Brother, Mother Family History of Uterine Cancer: Sister  Social History: Last updated: 12/26/2009 Retired  lives alone 3 kids-all healthy Never Smoked Regular exercise-no Alcohol Use - yes-occasional wine  Risk Factors: Exercise: no (09/09/2006)  Risk Factors: Smoking Status: never (09/25/2009)  Review of Systems  The patient denies anorexia, fever, weight loss, chest pain, syncope, dyspnea on exertion, peripheral edema, prolonged cough, headaches, hemoptysis, abdominal pain, muscle weakness, and depression.    Physical Exam  General:  Well-developed,well-nourished,in no acute distress; alert,appropriate and cooperative throughout examination Neck:  No deformities, masses, or tenderness noted. Lungs:  Normal respiratory effort, chest expands symmetrically. Lungs are clear to auscultation, no crackles or wheezes. Heart:  regular rhythm and rate with no S3 Extremities:  No clubbing, cyanosis, edema, or deformity noted with normal full range of motion of all joints.   Neurologic:  alert & oriented X3 and cranial nerves II-XII intact.   Skin:  no rashes, no suspicious lesions, and no ecchymoses.   Psych:  Oriented X3, normally interactive, good eye contact, not anxious appearing, and not depressed appearing.     Impression & Recommendations:  Problem # 1:  PALPITATIONS (ICD-785.1) Assessment New EKG LBBB which is old.  NO acute changes.  ?transient a fib.  Holter monitor if recurs. Her updated medication list for this problem includes:    Metoprolol Tartrate 25 Mg Tabs (Metoprolol tartrate) .Marland Kitchen... 1/2  tablet by mouth twice a day  Orders: EKG w/ Interpretation (93000)  Problem # 2:  ANEMIA, NORMOCYTIC (ICD-285.9)  Orders: Venipuncture (21308) Specimen Handling (65784) TLB-CBC Platelet - w/Differential (85025-CBCD)  Problem # 3:  FATIGUE / MALAISE (ICD-780.79)  Problem # 4:  INSOMNIA, CHRONIC (ICD-307.42)  Complete Medication List: 1)  Zolpidem  Tartrate 10 Mg Tabs (Zolpidem tartrate) .... Take 1 tablet by mouth at bedtime 2)  Simvastatin 40 Mg Tabs (Simvastatin) .... Take one tablet at bedtime 3)  Metoprolol Tartrate 25 Mg Tabs (Metoprolol tartrate) .... 1/2  tablet by mouth twice a day 4)  Plavix 75 Mg Tabs (Clopidogrel bisulfate) .... Take 1 tablet by mouth once a day 5)  Pantoprazole Sodium 40 Mg Tbec (Pantoprazole sodium) .... Take 1/2 tablet daily 6)  Aspirin 81 Mg Tbec (Aspirin) .... Once daily 7)  Ocuvite Tabs (Multiple vitamins-minerals) .... Take 1 tablet by mouth once a day 8)  Preparation H Hydrocortisone 1 % Crea (Hydrocortisone) .... As needed 9)  Hydrocortisone Acetate 25 Mg Supp (Hydrocortisone acetate) .... Use 1 suppository at bedtime for 7 days. 10)  Analpram-hc 1-2.5 % Crea (Hydrocortisone ace-pramoxine) .... Apply two times a day to external hemorrhoid  Patient Instructions: 1)  Please schedule a follow-up appointment in 3 months .  2)  Follow up sooner for any recurrent palpitations Prescriptions: ZOLPIDEM TARTRATE 10 MG TABS (ZOLPIDEM TARTRATE) Take 1 tablet by mouth at bedtime  #90 x 1   Entered and Authorized by:   [name] MD   Signed by:   [name] MD on 03/07/2010   Method used:   Print then Give to Patient   RxID:   [id number]    Orders Added: 1)  EKG w/ Interpretation [93000] 2)  Venipuncture [14782] 3)  Specimen Handling [99000] 4)  TLB-CBC Platelet - w/Differential [85025-CBCD] 5)  Est. Patient Level IV [95621]

## 2010-03-15 NOTE — Progress Notes (Signed)
Summary: Gait Training PT order request  Phone Note From Other Clinic Call back at Home Phone [phone]   Caller: Receptionist Call For: burchette Summary of Call: Pt called [facility] PT and requested Gait Training again,  requesting order Initial call taken by: Sid Falcon LPN,  January 24, 2010 10:13 AM  Follow-up for Phone Call        OK to set up Follow-up by: [name] MD,  January 24, 2010 5:53 PM  Additional Follow-up for Phone Call Additional follow up Details #1::        Order created Additional Follow-up by: Sid Falcon LPN,  January 25, 2010 8:57 AM  New Problems: KNEE PAIN (ICD-719.46)   New Problems: KNEE PAIN (ICD-719.46)

## 2010-03-15 NOTE — Miscellaneous (Signed)
Summary: BONE DENSITY  Clinical Lists Changes  Orders: Added new Test order of T-Bone Densitometry (77080) - Signed Added new Test order of T-Lumbar Vertebral Assessment (77082) - Signed 

## 2010-03-15 NOTE — Progress Notes (Signed)
Summary: REQ FOR LABWORK & F/U APPT?  Phone Note Call from Patient   Caller: Patient [phone] Reason for Call: Talk to Doctor Summary of Call: Pt called to req labwork / f/u appt with Dr Cato Mulligan prior to her colonoscopy appt on Friday, 05/05/2009.....  Can you advise order for labwork?  Can you work pt into your schedule for a review of labs sometime next week (05/01/2009 - 05/04/2009).... Pt adv that she can come in whenever you are able to work her in except for Thursday morning (she has to [place]htr [place] the airport because she is staying with her post-colonoscopy).  Pt can be reached at [phone] with any questions or concerns.  Initial call taken by: Debbra Riding,  April 24, 2009 9:43 AM  Follow-up for Phone Call        Pt states she gets lab work every three months and would like to know everything is ok before colonoscopy.  She understands MD not available this week and schedule full for next week.  She would just like labs done this week or next week.  What would you like done? Follow-up by: Gladis Riffle, RN,  April 24, 2009 10:18 AM  Additional Follow-up for Phone Call Additional follow up Details #1::        see me q 4 months labs one week prior to visit lipids---272.4 lfts-995.2 bmet-995.2 A1C-250.02    Additional Follow-up by: Birdie Sons MD,  April 25, 2009 1:27 AM    Additional Follow-up for Phone Call Additional follow up Details #2::    Phone Call Completed - Contacted pt and adv her of Dr Cato Mulligan instructions.... Pt adv that she would c/b post colonoscopy to make appt for labs / OV w/ Dr Cato Mulligan.  Follow-up by: Debbra Riding,  April 25, 2009 9:24 AM

## 2010-03-15 NOTE — Miscellaneous (Signed)
  Clinical Lists Changes  Medications: Added new medication of ANALPRAM-HC 1-2.5 % CREA (HYDROCORTISONE ACE-PRAMOXINE) Apply two times a day to external hemorrhoid - Signed Rx of ANALPRAM-HC 1-2.5 % CREA (HYDROCORTISONE ACE-PRAMOXINE) Apply two times a day to external hemorrhoid;  #15 Grams x 0;  Signed;  Entered by: Jesse Fall RN;  Authorized by: Hart Carwin MD;  Method used: Electronically to [facility]  [phone]*, [address], [place], [place], [place]  [address], Ph: [phone] or [phone], Fax: [phone]    Prescriptions: ANALPRAM-HC 1-2.5 % CREA (HYDROCORTISONE ACE-PRAMOXINE) Apply two times a day to external hemorrhoid  #15 Grams x 0   Entered by:   Jesse Fall RN   Authorized by:   Hart Carwin MD   Signed by:   Jesse Fall RN on 01/09/2010   Method used:   Electronically to        [facility]  [phone]* (retail)       [address]       [place], [place]  [address]       Ph: [phone] or [phone]       Fax: [phone]   RxID:   [id number]

## 2010-03-15 NOTE — Assessment & Plan Note (Signed)
Summary: ONE MTH ROV // RS   Vital Signs:  Patient profile:   77 year old female Weight:      132 pounds Temp:     97.5 degrees F oral BP sitting:   120 / 60  (left arm) Cuff size:   regular  Vitals Entered By: Sid Falcon LPN (January 22, 2010 10:01 AM)  History of Present Illness: Patient here for medical followup. She's had some recent problems with internal hemorrhoids which have finally quit bleeding with hydrocortisone suppositories. No recent constipation issues. She has not noted any hematochezia the past couple of weeks.  Recent pneumonia and is improved clinically. No cough or fever at this time. She does have some generalized weakness which she attributes to this. She had been tentatively scheduled for physical therapy but elected not to pursue at this time. No recent falls.  Normocytic anemia. Takes multivitamin supplement. No recent bleeding complications other than above with hemorrhoids.  History of type 2 diabetes which has been managed with diet. A1c low 7 range. No symptoms of hyperglycemia.  Allergies: 1)  ! Doxycycline Hyclate (Doxycycline Hyclate) 2)  ! Sulfa 3)  Macrodantin (Nitrofurantoin Macrocrystal) 4)  Septra  Past History:  Past Medical History: Last updated: 12/26/2009 CAD, NATIVE VESSEL (ICD-414.01) HYPERTENSION (ICD-401.9) HYPERLIPIDEMIA (ICD-272.4) TIA (ICD-435.9) GERD (ICD-530.81) DIABETES MELLITUS, TYPE II (ICD-250.00) INTERNAL HEMORRHOIDS (ICD-455.0) COLONIC POLYPS, ADENOMATOUS Family Hx of COLON CANCER (ICD-153.9) FOOT PAIN (ICD-729.5) LEFT BUNDLE BRANCH BLOCK (ICD-426.3) OSTEOPOROSIS (ICD-733.00) OSTEOARTHRITIS (ICD-715.90) DIVERTICULOSIS, COLON (ICD-562.10)  Past Surgical History: Last updated: 03/01/2009 Tonsillectomy colon   2001 Cataract Extraction Right 5th metatarsal repair  Family History: Last updated: 04/07/2009 mother MI 13 father 93-CHF sister with breast CA Family History of Colon Cancer: Brother Family  History of Heart Disease: Brother, Mother Family History of Uterine Cancer: Sister  Social History: Last updated: 12/26/2009 Retired  lives alone 3 kids-all healthy Never Smoked Regular exercise-no Alcohol Use - yes-occasional wine  Risk Factors: Exercise: no (09/09/2006)  Risk Factors: Smoking Status: never (09/25/2009) PMH-FH-SH reviewed for relevance  Review of Systems  The patient denies anorexia, fever, weight loss, chest pain, syncope, dyspnea on exertion, peripheral edema, prolonged cough, hemoptysis, abdominal pain, melena, hematochezia, and severe indigestion/heartburn.    Physical Exam  General:  Well-developed,well-nourished,in no acute distress; alert,appropriate and cooperative throughout examination Mouth:  Oral mucosa and oropharynx without lesions or exudates.  Teeth in good repair. Neck:  No deformities, masses, or tenderness noted. Lungs:  Normal respiratory effort, chest expands symmetrically. Lungs are clear to auscultation, no crackles or wheezes. Heart:  normal rate and regular rhythm.   Extremities:  No clubbing, cyanosis, edema, or deformity noted with normal full range of motion of all joints.   Neurologic:  alert & oriented X3 and cranial nerves II-XII intact.   Skin:  no rashes and no suspicious lesions.   Psych:  normally interactive, good eye contact, not anxious appearing, and not depressed appearing.     Impression & Recommendations:  Problem # 1:  ANEMIA, NORMOCYTIC (ICD-285.9) repeat CBC.   Orders: Venipuncture (04540) Specimen Handling (98119) TLB-CBC Platelet - w/Differential (85025-CBCD)  Problem # 2:  HEMORRHOIDS-INTERNAL (ICD-455.0) Assessment: Improved per pt improved symptomatically at this time.  no constipation issues.  Problem # 3:  HYPERTENSION (ICD-401.9)  Her updated medication list for this problem includes:    Metoprolol Tartrate 25 Mg Tabs (Metoprolol tartrate) .Marland Kitchen... 1/2  tablet by mouth twice a day  Problem # 4:   DIABETES MELLITUS, TYPE II (ICD-250.00) has  been diet controlled.  Repeat A1C. Her updated medication list for this problem includes:    Aspirin 81 Mg Tbec (Aspirin) ..... Once daily  Orders: Venipuncture (10272) Specimen Handling (53664) TLB-A1C / Hgb A1C (Glycohemoglobin) (83036-A1C)  Complete Medication List: 1)  Zolpidem Tartrate 10 Mg Tabs (Zolpidem tartrate) .... Take 1 tablet by mouth at bedtime 2)  Simvastatin 40 Mg Tabs (Simvastatin) .... Take one tablet at bedtime 3)  Metoprolol Tartrate 25 Mg Tabs (Metoprolol tartrate) .... 1/2  tablet by mouth twice a day 4)  Plavix 75 Mg Tabs (Clopidogrel bisulfate) .... Take 1 tablet by mouth once a day 5)  Pantoprazole Sodium 40 Mg Tbec (Pantoprazole sodium) .... Take 1/2 tablet daily 6)  Aspirin 81 Mg Tbec (Aspirin) .... Once daily 7)  Ocuvite Tabs (Multiple vitamins-minerals) .... Take 1 tablet by mouth once a day 8)  Preparation H Hydrocortisone 1 % Crea (Hydrocortisone) .... As needed 9)  Hydrocortisone Acetate 25 Mg Supp (Hydrocortisone acetate) .... Use 1 suppository at bedtime for 7 days. 10)  Analpram-hc 1-2.5 % Crea (Hydrocortisone ace-pramoxine) .... Apply two times a day to external hemorrhoid  Patient Instructions: 1)  Please schedule a follow-up appointment in 4 months .    Orders Added: 1)  Venipuncture [40347] 2)  Specimen Handling [99000] 3)  TLB-CBC Platelet - w/Differential [85025-CBCD] 4)  TLB-A1C / Hgb A1C (Glycohemoglobin) [83036-A1C] 5)  Est. Patient Level IV [42595]

## 2010-03-15 NOTE — Assessment & Plan Note (Signed)
Summary: knee pain/dm   Vital Signs:  Patient profile:   77 year old female Weight:      134 pounds Temp:     98.2 degrees F oral BP sitting:   140 / 60  (left arm) Cuff size:   regular  Vitals Entered By: Sid Falcon LPN (November 17, 2009 10:08 AM)  Primary Care Provider:  Birdie Sons MD   History of Present Illness: L knee discomfort main compaint is that she feels it is not trustworthy minimal , if any, pain. feels like knee is slipping  no knee pain, trauma no other concerns  Allergies: 1)  ! Doxycycline Hyclate (Doxycycline Hyclate) 2)  ! Sulfa 3)  Macrodantin (Nitrofurantoin Macrocrystal) 4)  Septra  Past History:  Past Medical History: Last updated: 04/07/2009 Current Problems:  CAD, NATIVE VESSEL (ICD-414.01) HYPERTENSION (ICD-401.9) HYPERLIPIDEMIA (ICD-272.4) TIA (ICD-435.9) GERD (ICD-530.81) DIABETES MELLITUS, TYPE II (ICD-250.00) INTERNAL HEMORRHOIDS (ICD-455.0) COLONIC POLYPS, HYPERPLASTIC, HX OF (ICD-V12.72) Family Hx of COLON CANCER (ICD-153.9) FOOT PAIN (ICD-729.5) LEFT BUNDLE BRANCH BLOCK (ICD-426.3) OSTEOPOROSIS (ICD-733.00) OSTEOARTHRITIS (ICD-715.90) DIVERTICULOSIS, COLON (ICD-562.10)  Past Surgical History: Last updated: 03/01/2009 Tonsillectomy colon   2001 Cataract Extraction Right 5th metatarsal repair  Family History: Last updated: 04/07/2009 mother MI 34 father 93-CHF sister with breast CA Family History of Colon Cancer: Brother Family History of Heart Disease: Brother, Mother Family History of Uterine Cancer: Sister  Social History: Last updated: 04/07/2009 lives alone 3 kids-all healthy Never Smoked Regular exercise-no Alcohol Use - yes-occasional wine  Risk Factors: Exercise: no (09/09/2006)  Risk Factors: Smoking Status: never (09/25/2009)  Physical Exam  General:  alert and well-developed.   Msk:  FROM left knee no effusion knee is stable   Impression & Recommendations:  Problem # 1:  KNEE PAIN  (ICD-719.46) needs further evaluation check xray and then will determine eval.  Her updated medication list for this problem includes:    Aspirin 81 Mg Tbec (Aspirin) ..... Once daily  Orders: T-Knee Comp Left 4 Views [phone])  Complete Medication List: 1)  Zolpidem Tartrate 10 Mg Tabs (Zolpidem tartrate) .... Take 1 tablet by mouth at bedtime 2)  Simvastatin 40 Mg Tabs (Simvastatin) .... Take one tablet at bedtime 3)  Metoprolol Tartrate 25 Mg Tabs (Metoprolol tartrate) .... 1/2  tablet by mouth twice a day 4)  Plavix 75 Mg Tabs (Clopidogrel bisulfate) .... Take 1 tablet by mouth once a day 5)  Pantoprazole Sodium 40 Mg Tbec (Pantoprazole sodium) .... Take 1/2 tablet daily 6)  Aspirin 81 Mg Tbec (Aspirin) .... Once daily 7)  Ocuvite Tabs (Multiple vitamins-minerals) .... Take 1 tablet by mouth once a day  Prevention & Chronic Care Immunizations   Influenza vaccine: given  (11/10/2009)   Influenza vaccine due: 11/16/2008    Tetanus booster: 09/25/2009: Td   Tetanus booster due: 02/11/2006    Pneumococcal vaccine: Pneumovax  (02/12/1996)   Pneumococcal vaccine due: None    H. zoster vaccine: 06/10/2006: Zostavax  Colorectal Screening   Hemoccult: Not documented    Colonoscopy: DONE  (05/05/2009)   Colonoscopy due: 08/2009  Other Screening   Pap smear: Not documented    Mammogram: Not documented    DXA bone density scan: Not documented   Smoking status: never  (09/25/2009)  Diabetes Mellitus   HgbA1C: 6.6  (09/18/2009)   Hemoglobin A1C due: 07/05/2008    Eye exam: normal  (02/11/2009)   Eye exam due: 02/2010    Foot exam: Not documented   High risk foot: Not  documented   Foot care education: Not documented    Urine microalbumin/creatinine ratio: 17.4  (01/13/2006)   Urine microalbumin/cr due: 01/14/2007  Lipids   Total Cholesterol: 138  (09/18/2009)   LDL: 71  (09/18/2009)   LDL Direct: Not documented   HDL: 42.80  (09/18/2009)   Triglycerides: 122.0   (09/18/2009)    SGOT (AST): 25  (09/18/2009)   SGPT (ALT): 21  (09/18/2009)   Alkaline phosphatase: 35  (09/18/2009)   Total bilirubin: 0.9  (09/18/2009)  Hypertension   Last Blood Pressure: 140 / 60  (11/17/2009)   Serum creatinine: 0.9  (09/18/2009)   Serum potassium 4.9  (09/18/2009)  Self-Management Support :    Diabetes self-management support: Not documented    Hypertension self-management support: Not documented    Lipid self-management support: Not documented    Preventive Care Screening  Last Flu Shot:    Date:  11/10/2009    Results:  given

## 2010-03-15 NOTE — Progress Notes (Signed)
Summary: Still Symptomatic  Phone Note Call from Patient Call back at Home Phone [phone]   Caller: Patient Call For: Dr [name] Reason for Call: Talk to Nurse Details for Reason: Still Symptomatic Summary of Call: Pt is still having rectal bleeding, Is not sure if she needs to be seen again. Initial call taken by: Dwan Bolt,  January 08, 2010 12:36 PM  Follow-up for Phone Call        Patient is calling to report that after using the Hydrocortisone supp.  for her hemorroid x 7 days as per Willette Cluster, RNP, she still has a small amount of bright red blood on the tissue paper when she wipes. Reports her bowel movements are soft and she is using baby wipes. Please, advise. Follow-up by: Jesse Fall RN,  January 08, 2010 2:15 PM  Additional Follow-up for Phone Call Additional follow up Details #1::        please refer to a surgeon to see if hems could be banded or treated surgically ( external hemorrhoid).  Analpram 2.5%, 15 gm, apply bit to external hemorrhoid.     Appended Document: Still Symptomatic Patient given Dr. Regino Schultze recommendations. Rx sent to her pharmacy. Patient scheduled with [facility]) to see Dr. Donell Beers on 01/19/10 at 10:00/10:20. Patient aware.

## 2010-03-15 NOTE — Progress Notes (Signed)
Summary: body aches  Phone Note Call from Patient Call back at Home Phone [phone]   Caller: Patient Reason for Call: Talk to Nurse Summary of Call: pt is not feeling well body aches. no chest pain or sob. pt will like to talk to Lauren. pt states Dr. Riley Kill told her to call if she was not feeling well today. Initial call taken by: Roe Coombs,  November 30, 2009 10:33 AM  Follow-up for Phone Call        Per Dr Riley Kill the only thing to do is try to arrange earlier follow-up with PCP.  The pt is scheduled to see Dr Cato Mulligan on 12/01/09.  I spoke with the pt and she would like to be seen today by someone at the [facility] office.  I arranged an appt for today at 4:00 with Dr Caryl Never.  Pt aware of appt.  Follow-up by: Julieta Gutting, RN, BSN,  November 30, 2009 12:59 PM

## 2010-03-15 NOTE — Assessment & Plan Note (Signed)
Summary: FU ON LABWORK/[facility]   Vital Signs:  Patient profile:   77 year old female Weight:      130 pounds Temp:     97.9 degrees F oral Pulse rate:   64 / minute Pulse rhythm:   regular Resp:     14 per minute BP sitting:   126 / 64  (left arm) Cuff size:   regular  Vitals Entered By: Gladis Riffle, RN (May 22, 2009 10:45 AM) CC: FU lab, labs done--needs refills simvastatin, metoprolol, plavix, and pantoprazole Is Patient Diabetic? No   Primary Care Provider:  Birdie Sons MD  CC:  FU lab, labs done--needs refills simvastatin, metoprolol, plavix, and and pantoprazole.  History of Present Illness: she complains of flatulence: "explosion". She knows of know dietary causes. She has tried gas-x---no relief. She takes a lot of over the counter supplements. reviewed dr brodies procedure and meds  DM---no sxs, tolerating meds  HTN--tolerating meds without difficulty  TIA---no recurrence  All other systems reviewed and were negative   Preventive Screening-Counseling & Management  Alcohol-Tobacco     Smoking Status: never  Current Problems (verified): 1)  Atrial Flutter  (ICD-427.32) 2)  Cad, Native Vessel  (ICD-414.01) 3)  Hypertension  (ICD-401.9) 4)  Hyperlipidemia  (ICD-272.4) 5)  Tia  (ICD-435.9) 6)  Gerd  (ICD-530.81) 7)  Diabetes Mellitus, Type II  (ICD-250.00) 8)  Internal Hemorrhoids  (ICD-455.0) 9)  Colonic Polyps, Hyperplastic, Hx of  (ICD-V12.72) 10)  Fh of Colon Cancer  (ICD-153.9) 11)  Foot Pain  (ICD-729.5) 12)  Left Bundle Branch Block  (ICD-426.3) 13)  Osteoporosis  (ICD-733.00) 14)  Osteoarthritis  (ICD-715.90) 15)  Diverticulosis, Colon  (ICD-562.10)  Current Medications (verified): 1)  Zolpidem Tartrate 10 Mg Tabs (Zolpidem Tartrate) .... Take 1 Tablet By Mouth At Bedtime 2)  Simvastatin 40 Mg Tabs (Simvastatin) .... Take One Tablet At Bedtime 3)  Metoprolol Tartrate 25 Mg Tabs (Metoprolol Tartrate) .... 1/2  Tablet By Mouth Twice A Day 4)  Plavix  75 Mg Tabs (Clopidogrel Bisulfate) .... Take 1 Tablet By Mouth Once A Day 5)  Pantoprazole Sodium 40 Mg  Tbec (Pantoprazole Sodium) .... Take 1/2 Tablet Daily 6)  Fish Oil 1000 Mg Caps (Omega-3 Fatty Acids) .... Once Daily 7)  Aspirin 81 Mg  Tbec (Aspirin) .... Once Daily 8)  Multivitamins   Tabs (Multiple Vitamin) .... Once Daily 9)  Vitamin C Cr 500 Mg  Cr-Caps (Ascorbic Acid) .... Once Daily 10)  Cinnamon 500 Mg  Caps (Cinnamon) .... Once Daily 11)  Vitamin B-6 100 Mg  Tabs (Pyridoxine Hcl) .... Once Daily 12)  Caltrate 600+d 600-400 Mg-Unit Tabs (Calcium Carbonate-Vitamin D) .... Take 1 Tablet By Mouth Two Times A Day 13)  Vitamin D 400 Unit Tabs (Cholecalciferol) .... Take 1 Tablet By Mouth Once A Day 14)  Vitamin E-400 400 Unit Caps (Vitamin E) .... Take 1 Capsule By Mouth Once A Day 15)  Ocuvite  Tabs (Multiple Vitamins-Minerals) .... Take 1 Tablet By Mouth Once A Day 16)  Glucosamine-Msm 500-400 Mg Caps (Glucosamine Sulfate-Msm) .... Take 1 Capsule By Mouth Two Times A Day 17)  Vitamin B-12 500 Mcg  Tabs (Cyanocobalamin) .... Take 1 Tablet By Mouth Once A Day 18)  Ginkoba 40 Mg Tabs (Ginkgo Biloba) .... Once Daily 19)  Benefiber  Powd (Wheat Dextrin) .... Use 1 Heaping Teaspoon Daily 20)  Bentyl 10 Mg Caps (Dicyclomine Hcl) .... Take 1 Capsule By Mouth Every Morning and As Needed in The Afternoon.  21)  Azithromycin 250 Mg Tabs (Azithromycin) .... 2 By Mouth Today Then One By Mouth Once Daily For 4 Days  Allergies: 1)  ! Doxycycline Hyclate (Doxycycline Hyclate) 2)  ! Sulfa 3)  Macrodantin (Nitrofurantoin Macrocrystal) 4)  Septra (Sulfamethoxazole-Trimethoprim)  Past History:  Past Medical History: Last updated: 04/07/2009 Current Problems:  CAD, NATIVE VESSEL (ICD-414.01) HYPERTENSION (ICD-401.9) HYPERLIPIDEMIA (ICD-272.4) TIA (ICD-435.9) GERD (ICD-530.81) DIABETES MELLITUS, TYPE II (ICD-250.00) INTERNAL HEMORRHOIDS (ICD-455.0) COLONIC POLYPS, HYPERPLASTIC, HX OF  (ICD-V12.72) Family Hx of COLON CANCER (ICD-153.9) FOOT PAIN (ICD-729.5) LEFT BUNDLE BRANCH BLOCK (ICD-426.3) OSTEOPOROSIS (ICD-733.00) OSTEOARTHRITIS (ICD-715.90) DIVERTICULOSIS, COLON (ICD-562.10)  Past Surgical History: Last updated: 03/01/2009 Tonsillectomy colon   2001 Cataract Extraction Right 5th metatarsal repair  Family History: Last updated: 04/07/2009 mother MI 18 father 93-CHF sister with breast CA Family History of Colon Cancer: Brother Family History of Heart Disease: Brother, Mother Family History of Uterine Cancer: Sister  Social History: Last updated: 04/07/2009 lives alone 3 kids-all healthy Never Smoked Regular exercise-no Alcohol Use - yes-occasional wine  Risk Factors: Exercise: no (09/09/2006)  Risk Factors: Smoking Status: never (05/22/2009)  Physical Exam  General:  Well-developed,well-nourished,in no acute distress; alert,appropriate and cooperative throughout examination Head:  normocephalic and atraumatic.   Eyes:  pupils equal and pupils round.   Ears:  R ear normal and L ear normal.   Nose:  no external deformity and no external erythema.   Neck:  No deformities, masses, or tenderness noted. Chest Wall:  no deformities and no tenderness.   Lungs:  normal respiratory effort and no accessory muscle use.   Heart:  normal rate and regular rhythm.   Abdomen:  soft and non-tender.   Msk:  No deformity or scoliosis noted of thoracic or lumbar spine.   Neurologic:  cranial nerves II-XII intact and gait normal.     Impression & Recommendations:  Problem # 1:  HYPERTENSION (ICD-401.9) All other systems reviewed and were negative  Her updated medication list for this problem includes:    Metoprolol Tartrate 25 Mg Tabs (Metoprolol tartrate) .Marland Kitchen... 1/2  tablet by mouth twice a day  BP today: 126/64 Prior BP: 120/58 (04/27/2009)  Prior 10 Yr Risk Heart Disease: N/A (09/25/2006)  Labs Reviewed: K+: 4.5 (05/15/2009) Creat: : 1.0  (05/15/2009)   Chol: 115 (05/15/2009)   HDL: 49.60 (05/15/2009)   LDL: 49 (05/15/2009)   TG: 81.0 (05/15/2009)  Problem # 2:  HYPERLIPIDEMIA (ICD-272.4) well controlled continue current medications  Her updated medication list for this problem includes:    Simvastatin 40 Mg Tabs (Simvastatin) .Marland Kitchen... Take one tablet at bedtime  Labs Reviewed: SGOT: 28 (05/15/2009)   SGPT: 24 (05/15/2009)  Lipid Goals: Chol Goal: 200 (09/25/2006)   HDL Goal: 40 (09/25/2006)   LDL Goal: 70 (09/25/2006)   TG Goal: 150 (09/25/2006)  Prior 10 Yr Risk Heart Disease: N/A (09/25/2006)   HDL:49.60 (05/15/2009), 46.50 (12/20/2008)  LDL:49 (05/15/2009), 59 (12/20/2008)  Chol:115 (05/15/2009), 122 (12/20/2008)  Trig:81.0 (05/15/2009), 83.0 (12/20/2008)  Problem # 3:  TIA (ICD-435.9) no recurrence Her updated medication list for this problem includes:    Plavix 75 Mg Tabs (Clopidogrel bisulfate) .Marland Kitchen... Take 1 tablet by mouth once a day    Aspirin 81 Mg Tbec (Aspirin) ..... Once daily  Problem # 4:  DIABETES MELLITUS, TYPE II (ICD-250.00) controlled no meds---she follows a low CHO diet Her updated medication list for this problem includes:    Aspirin 81 Mg Tbec (Aspirin) ..... Once daily  Labs Reviewed: Creat: 1.0 (05/15/2009)  Last Eye Exam: normal (02/12/2007) Reviewed HgBA1c results: 6.6 (05/15/2009)  6.4 (12/20/2008)  Problem # 5:  ATRIAL FLUTTER (ICD-427.32) no recurrent sxs  Her updated medication list for this problem includes:    Metoprolol Tartrate 25 Mg Tabs (Metoprolol tartrate) .Marland Kitchen... 1/2  tablet by mouth twice a day    Plavix 75 Mg Tabs (Clopidogrel bisulfate) .Marland Kitchen... Take 1 tablet by mouth once a day    Aspirin 81 Mg Tbec (Aspirin) ..... Once daily  Complete Medication List: 1)  Zolpidem Tartrate 10 Mg Tabs (Zolpidem tartrate) .... Take 1 tablet by mouth at bedtime 2)  Simvastatin 40 Mg Tabs (Simvastatin) .... Take one tablet at bedtime 3)  Metoprolol Tartrate 25 Mg Tabs (Metoprolol  tartrate) .... 1/2  tablet by mouth twice a day 4)  Plavix 75 Mg Tabs (Clopidogrel bisulfate) .... Take 1 tablet by mouth once a day 5)  Pantoprazole Sodium 40 Mg Tbec (Pantoprazole sodium) .... Take 1/2 tablet daily 6)  Aspirin 81 Mg Tbec (Aspirin) .... Once daily 7)  Ocuvite Tabs (Multiple vitamins-minerals) .... Take 1 tablet by mouth once a day  Patient Instructions: 1)  stop all supplements---could be contributing to stomach problems. If symptoms persist it might be worth seeing dr. [name] again 2)  Please schedule a follow-up appointment in 4 months. 3)  labs one week prior to visit 4)  lipids---272.4 5)  lfts-995.2 6)  bmet-995.2 7)  A1C-250.02 8)     Prescriptions: PANTOPRAZOLE SODIUM 40 MG  TBEC (PANTOPRAZOLE SODIUM) take 1/2 tablet daily  #90 x 3   Entered and Authorized by:   Birdie Sons MD   Signed by:   Birdie Sons MD on 05/22/2009   Method used:   Electronically to        [facility]* (mail-order)             ,          Ph: [phone]       Fax: [phone]   RxID:   [id number] PLAVIX 75 MG TABS (CLOPIDOGREL BISULFATE) Take 1 tablet by mouth once a day  #90 x 3   Entered and Authorized by:   Birdie Sons MD   Signed by:   Birdie Sons MD on 05/22/2009   Method used:   Electronically to        [facility]* (mail-order)             ,          Ph: [phone]       Fax: [phone]   RxID:   [id number] METOPROLOL TARTRATE 25 MG TABS (METOPROLOL TARTRATE) 1/2  tablet by mouth twice a day  #90 x 3   Entered and Authorized by:   Birdie Sons MD   Signed by:   Birdie Sons MD on 05/22/2009   Method used:   Electronically to        [facility]* (mail-order)             ,          Ph: [phone]       Fax: [phone]   RxID:   [id number] SIMVASTATIN 40 MG TABS (SIMVASTATIN) Take one tablet at bedtime  #90 x 3   Entered and Authorized by:   Birdie Sons MD   Signed by:   Birdie Sons MD on 05/22/2009   Method used:   Electronically  to        [facility]* (mail-order)             ,  Ph: [phone]       Fax: [phone]   RxID:   [id number]

## 2010-03-15 NOTE — Progress Notes (Signed)
Summary: Does pt need another Dexa?  Phone Note Call from Patient Call back at Home Phone [phone]   Caller: Patient Call For: [name] MD Summary of Call: VM from pt questioning if she needs another bone density.  She received a letter from where she had her bone density 2 years ago stating Medicare would pay for another, so it's time.  Does she need to have another one? Initial call taken by: Sid Falcon LPN,  February 08, 2010 11:17 AM  Follow-up for Phone Call        yes, if over 2 years ago. Follow-up by: [name] MD,  February 08, 2010 1:16 PM  Additional Follow-up for Phone Call Additional follow up Details #1::        Pt informed Additional Follow-up by: Sid Falcon LPN,  February 08, 2010 1:30 PM

## 2010-03-15 NOTE — Medication Information (Signed)
Summary: Coverage Approval for Pantoprazole Sodium  Coverage Approval for Pantoprazole Sodium   Imported By: Maryln Gottron 05/15/2009 10:13:29  _____________________________________________________________________  External Attachment:    Type:   Image     Comment:   External Document

## 2010-03-15 NOTE — Letter (Signed)
Summary: [facility] Instructions  [facility]  [address] [place], [place] [address]   Phone: [phone]  Fax: [phone]       [name]    [birth year]    MRN: [id number]       Procedure Day /Date: Friday 05/05/2009     Arrival Time: 7:30am     Procedure Time: 8:00am     Location of Procedure:                    [facility] (4th Floor)  PREPARATION FOR COLONOSCOPY WITH MIRALAX  Starting 5 days prior to your procedure 04/30/2009 do not eat nuts, seeds, popcorn, corn, beans, peas,  salads, or any raw vegetables.  Do not take any fiber supplements (e.g. Metamucil, Citrucel, and Benefiber). ____________________________________________________________________________________________________   THE DAY BEFORE YOUR PROCEDURE         DATE: 05/04/2009 DAY: Thursday  1   Drink clear liquids the entire day-NO SOLID FOOD  2   Do not drink anything colored red or purple.  Avoid juices with pulp.  No orange juice.  3   Drink at least 64 oz. (8 glasses) of fluid/clear liquids during the day to prevent dehydration and help the prep work efficiently.  CLEAR LIQUIDS INCLUDE: Water Jello Ice Popsicles Tea (sugar ok, no milk/cream) Powdered fruit flavored drinks Coffee (sugar ok, no milk/cream) Gatorade Juice: apple, white grape, white cranberry  Lemonade Clear bullion, consomm, broth Carbonated beverages (any kind) Strained chicken noodle soup Hard Candy  4   Mix the entire bottle of Miralax with 64 oz. of Gatorade/Powerade in the morning and put in the refrigerator to chill.  5   At 3:00 pm take 2 Dulcolax/Bisacodyl tablets.  6   At 4:30 pm take one Reglan/Metoclopramide tablet.  7  Starting at 5:00 pm drink one 8 oz glass of the Miralax mixture every 15-20 minutes until you have finished drinking the entire 64 oz.  You should finish drinking prep around 7:30 or 8:00 pm.  8   If you are nauseated, you may take the 2nd Reglan/Metoclopramide tablet at  6:30 pm.        9    At 8:00 pm take 2 more DULCOLAX/Bisacodyl tablets.        THE DAY OF YOUR PROCEDURE      DATE:  05/05/2009 DAY: Friday  You may drink clear liquids until 6:00am  (2 HOURS BEFORE PROCEDURE).   MEDICATION INSTRUCTIONS  Unless otherwise instructed, you should take regular prescription medications with a small sip of water as early as possible the morning of your procedure.  Diabetic patients - see separate instructions.  Continue taking Plavix per Dr. [name]            OTHER INSTRUCTIONS  You will need a responsible adult at least 75 years of age to accompany you and drive you home.   This person must remain in the waiting room during your procedure.  Wear loose fitting clothing that is easily removed.  Leave jewelry and other valuables at home.  However, you may wish to bring a book to read or an iPod/MP3 player to listen to music as you wait for your procedure to start.  Remove all body piercing jewelry and leave at home.  Total time from sign-in until discharge is approximately 2-3 hours.  You should go home directly after your procedure and rest.  You can resume normal activities the day after your procedure.  The day of your procedure you should not:   Drive   Make legal decisions   Operate machinery   Drink alcohol   Return to work  You will receive specific instructions about eating, activities and medications before you leave.   The above instructions have been reviewed and explained to me by   Harlow Mares CMA Duncan Dull)  March 06, 2009 4:06 PM     I fully understand and can verbalize these instructions _____________________________ Date 03/06/2009

## 2010-03-15 NOTE — Assessment & Plan Note (Signed)
Summary: FU BRONCHITIS/NJR   Vital Signs:  Patient profile:   77 year old female Weight:      130 pounds Temp:     98.2 degrees F oral Pulse rhythm:   regular BP sitting:   146 / 76  Vitals Entered By: Lynann Beaver CMA AAMA (December 08, 2009 11:39 AM)   Primary Care Amel Gianino:  Birdie Sons MD   History of Present Illness: States she is no better complains of being diffusely weak cough is significantly better (essentially resolved) She denies any pains,  no fever, chills Appetite--good appetite fluid intake---good sleeping -- well but relies on ambien chronically recent epistaxis---se previous notes, no recurrence  it is worth noting that she has been able to run her errands and has had her hair done this week. After discussion she does state that she feels better on some days but not generally better.   All other systems reviewed and were negative\par Current Problems (verified): 1)  Anemia, Normocytic  (ICD-285.9) 2)  Acute Bronchitis  (ICD-466.0) 3)  Fatigue / Malaise  (ICD-780.79) 4)  Atrial Flutter  (ICD-427.32) 5)  Cad, Native Vessel  (ICD-414.01) 6)  Hypertension  (ICD-401.9) 7)  Hyperlipidemia  (ICD-272.4) 8)  Tia  (ICD-435.9) 9)  Gerd  (ICD-530.81) 10)  Diabetes Mellitus, Type II  (ICD-250.00) 11)  Colonic Polyps, Hyperplastic, Hx of  (ICD-V12.72) 12)  Left Bundle Branch Block  (ICD-426.3) 13)  Osteoporosis  (ICD-733.00) 14)  Osteoarthritis  (ICD-715.90) 15)  Diverticulosis, Colon  (ICD-562.10)  Current Medications (verified): 1)  Zolpidem Tartrate 10 Mg Tabs (Zolpidem Tartrate) .... Take 1 Tablet By Mouth At Bedtime 2)  Simvastatin 40 Mg Tabs (Simvastatin) .... Take One Tablet At Bedtime 3)  Metoprolol Tartrate 25 Mg Tabs (Metoprolol Tartrate) .... 1/2  Tablet By Mouth Twice A Day 4)  Plavix 75 Mg Tabs (Clopidogrel Bisulfate) .... Take 1 Tablet By Mouth Once A Day 5)  Pantoprazole Sodium 40 Mg  Tbec (Pantoprazole Sodium) .... Take 1/2 Tablet Daily 6)   Aspirin 81 Mg  Tbec (Aspirin) .... Once Daily 7)  Ocuvite  Tabs (Multiple Vitamins-Minerals) .... Take 1 Tablet By Mouth Once A Day 8)  Delsym 30 Mg/46ml Lqcr (Dextromethorphan Polistirex) .... As Needed 9)  Avelox 400 Mg  Tabs (Moxifloxacin Hcl) .Marland Kitchen.. 1 Tablet By Mouth Daily  Allergies (verified): 1)  ! Doxycycline Hyclate (Doxycycline Hyclate) 2)  ! Sulfa 3)  Macrodantin (Nitrofurantoin Macrocrystal) 4)  Septra  Past History:  Past Medical History: Last updated: 04/07/2009 Current Problems:  CAD, NATIVE VESSEL (ICD-414.01) HYPERTENSION (ICD-401.9) HYPERLIPIDEMIA (ICD-272.4) TIA (ICD-435.9) GERD (ICD-530.81) DIABETES MELLITUS, TYPE II (ICD-250.00) INTERNAL HEMORRHOIDS (ICD-455.0) COLONIC POLYPS, HYPERPLASTIC, HX OF (ICD-V12.72) Family Hx of COLON CANCER (ICD-153.9) FOOT PAIN (ICD-729.5) LEFT BUNDLE BRANCH BLOCK (ICD-426.3) OSTEOPOROSIS (ICD-733.00) OSTEOARTHRITIS (ICD-715.90) DIVERTICULOSIS, COLON (ICD-562.10)  Past Surgical History: Last updated: 03/01/2009 Tonsillectomy colon   2001 Cataract Extraction Right 5th metatarsal repair  Family History: Last updated: 04/07/2009 mother MI 29 father 93-CHF sister with breast CA Family History of Colon Cancer: Brother Family History of Heart Disease: Brother, Mother Family History of Uterine Cancer: Sister  Social History: Last updated: 04/07/2009 lives alone 3 kids-all healthy Never Smoked Regular exercise-no Alcohol Use - yes-occasional wine  Risk Factors: Exercise: no (09/09/2006)  Risk Factors: Smoking Status: never (09/25/2009)  Physical Exam  General:  alert and well-developed.   Head:  normocephalic and atraumatic.   Eyes:  pupils equal and pupils round.   Ears:  R ear normal and L ear normal.  Neck:  No deformities, masses, or tenderness noted. Chest Wall:  No deformities, masses, or tenderness noted. Lungs:  normal respiratory effort and no intercostal retractions.  CTA Heart:  normal rate and  regular rhythm.   Abdomen:  soft and non-tender.   Skin:  turgor normal and color normal.   Psych:  normally interactive and good eye contact.     Impression & Recommendations:  Problem # 1:  ACUTE BRONCHITIS (ICD-466.0)  ? recovering from pneumonia she has completed Avelox acute sxs have resolved I'm concerned that she is not thriving at home. I think she needs someone to look in on her several times daily. she has family in the [place]. I agree with them that she should probably move there to be with them The following medications were removed from the medication list:    Delsym 30 Mg/35ml Lqcr (Dextromethorphan polistirex) .Marland Kitchen... As needed    Avelox 400 Mg Tabs (Moxifloxacin hcl) .Marland Kitchen... 1 tablet by mouth daily  Orders: T-2 View CXR (71020TC)  Problem # 2:  ANEMIA, NORMOCYTIC (ICD-285.9) likely related to acute blood loss needs to f/u Hgb: 10.1 (11/30/2009)   Hct: 29.4 (11/30/2009)   Platelets: 260.0 (11/30/2009) RBC: 2.97 (11/30/2009)   RDW: 13.0 (11/30/2009)   WBC: 9.8 (11/30/2009) MCV: 99.1 (11/30/2009)   MCHC: 34.3 (11/30/2009) B12: 413 (05/12/2007)   TSH: 2.24 (11/27/2009)  Problem # 3:  HYPERTENSION (ICD-401.9)  Her updated medication list for this problem includes:    Metoprolol Tartrate 25 Mg Tabs (Metoprolol tartrate) .Marland Kitchen... 1/2  tablet by mouth twice a day  BP today: 146/76 Prior BP: 166/60 (12/01/2009)  Prior 10 Yr Risk Heart Disease: N/A (09/25/2006)  Labs Reviewed: K+: 4.6 (11/27/2009) Creat: : 1.0 (11/27/2009)   Chol: 138 (09/18/2009)   HDL: 42.80 (09/18/2009)   LDL: 71 (09/18/2009)   TG: 122.0 (09/18/2009)  Complete Medication List: 1)  Zolpidem Tartrate 10 Mg Tabs (Zolpidem tartrate) .... Take 1 tablet by mouth at bedtime 2)  Simvastatin 40 Mg Tabs (Simvastatin) .... Take one tablet at bedtime 3)  Metoprolol Tartrate 25 Mg Tabs (Metoprolol tartrate) .... 1/2  tablet by mouth twice a day 4)  Plavix 75 Mg Tabs (Clopidogrel bisulfate) .... Take 1 tablet by  mouth once a day 5)  Pantoprazole Sodium 40 Mg Tbec (Pantoprazole sodium) .... Take 1/2 tablet daily 6)  Aspirin 81 Mg Tbec (Aspirin) .... Once daily 7)  Ocuvite Tabs (Multiple vitamins-minerals) .... Take 1 tablet by mouth once a day  Patient Instructions: 1)  Please schedule a follow-up appointment in 2 weeks.   Orders Added: 1)  Est. Patient Level IV [02725] 2)  T-2 View CXR [71020TC]

## 2010-03-15 NOTE — Progress Notes (Signed)
Summary: mail order rx  Phone Note Call from Patient Call back at Home Phone [phone]   Caller: Patient Call For: Birdie Sons MD Summary of Call: PT IS REQUESTING A WRITTEN RX FOR PANTOPRAZOLE 40 MG #90 WITH 3 REFILLS, SHE WILL MAIL RX AT HER CONVENIENCE TO [facility]. Pt will be her for labs on 05-15-2009 she would like to pick up rx than. Initial call taken by: Heron Sabins,  May 10, 2009 4:14 PM    Prescriptions: PANTOPRAZOLE SODIUM 40 MG  TBEC (PANTOPRAZOLE SODIUM) take 1/2 tablet daily  #90 x 3   Entered by:   Willy Eddy, LPN   Authorized by:   Birdie Sons MD   Signed by:   Willy Eddy, LPN on 25/36/6440   Method used:   Print then Give to Patient   RxID:   [id number]

## 2010-03-15 NOTE — Assessment & Plan Note (Signed)
Summary: ROV   Visit Type:  Follow-up Referring Provider:  n/a Primary Provider:  Birdie Sons MD  CC:  fatigue / SOB .  History of Present Illness: [name] to urgent care on Saturday, and had a cold.  She was placed on a Z pack by the urgent care.  She had bronchitis a year ago.  She had an Xray and did not apparently have evidence of pneumonia.  SHe does not feel that well.  She had a fever just one day.  No chest pain, but it does hurt when she coughs. She coughed up alot of thick, yellow sputum which is now better.    Current Medications (verified): 1)  Zolpidem Tartrate 10 Mg Tabs (Zolpidem Tartrate) .... Take 1 Tablet By Mouth At Bedtime 2)  Simvastatin 40 Mg Tabs (Simvastatin) .... Take One Tablet At Bedtime 3)  Metoprolol Tartrate 25 Mg Tabs (Metoprolol Tartrate) .... 1/2  Tablet By Mouth Twice A Day 4)  Plavix 75 Mg Tabs (Clopidogrel Bisulfate) .... Take 1 Tablet By Mouth Once A Day 5)  Pantoprazole Sodium 40 Mg  Tbec (Pantoprazole Sodium) .... Take 1/2 Tablet Daily 6)  Aspirin 81 Mg  Tbec (Aspirin) .... Once Daily 7)  Ocuvite  Tabs (Multiple Vitamins-Minerals) .... Take 1 Tablet By Mouth Once A Day 8)  Zithromax 1 Gm Pack (Azithromycin) .... As Directed 9)  Lortab 7.5-500 Mg Tabs (Hydrocodone-Acetaminophen) .... As Needed 10)  Delsym 30 Mg/56ml Lqcr (Dextromethorphan Polistirex) .... As Needed  Allergies (verified): 1)  ! Doxycycline Hyclate (Doxycycline Hyclate) 2)  ! Sulfa 3)  Macrodantin (Nitrofurantoin Macrocrystal) 4)  Septra  Past History:  Past Medical History: Last updated: 04/07/2009 Current Problems:  CAD, NATIVE VESSEL (ICD-414.01) HYPERTENSION (ICD-401.9) HYPERLIPIDEMIA (ICD-272.4) TIA (ICD-435.9) GERD (ICD-530.81) DIABETES MELLITUS, TYPE II (ICD-250.00) INTERNAL HEMORRHOIDS (ICD-455.0) COLONIC POLYPS, HYPERPLASTIC, HX OF (ICD-V12.72) Family Hx of COLON CANCER (ICD-153.9) FOOT PAIN (ICD-729.5) LEFT BUNDLE BRANCH BLOCK (ICD-426.3) OSTEOPOROSIS  (ICD-733.00) OSTEOARTHRITIS (ICD-715.90) DIVERTICULOSIS, COLON (ICD-562.10)  Past Surgical History: Last updated: 03/01/2009 Tonsillectomy colon   2001 Cataract Extraction Right 5th metatarsal repair  Family History: Last updated: 04/07/2009 mother MI 87 father 93-CHF sister with breast CA Family History of Colon Cancer: Brother Family History of Heart Disease: Brother, Mother Family History of Uterine Cancer: Sister  Social History: Last updated: 04/07/2009 lives alone 3 kids-all healthy Never Smoked Regular exercise-no Alcohol Use - yes-occasional wine  Vital Signs:  Patient profile:   77 year old female Height:      66 inches Weight:      136 pounds BMI:     22.03 Pulse rate:   77 / minute BP sitting:   132 / 50  (left arm) Cuff size:   large  Vitals Entered By: [name], RMA (November 27, 2009 1:59 PM)  Physical Exam  General:  Well developed, well nourished, in no acute distress. Head:  normocephalic and atraumatic Eyes:  PERRLA/EOM intact; conjunctiva and lids normal. Lungs:  Clear bilaterally to auscultation and percussion. Heart:  PMI non displaced.  No SEM.  No DM.   Msk:  Back normal, normal gait. Muscle strength and tone normal. Extremities:  No clubbing or cyanosis. Neurologic:  Alert and oriented x 3.   EKG  Procedure date:  11/27/2009  Findings:      NSR. LBBB.  Impression & Recommendations:  Problem # 1:  ACUTE BRONCHITIS (ICD-466.0) Her fever has resolved.  Had typical findings. CXR reviewed, and no def infiltrate.  Will check CBC.  Currently on zithromax  Orders: TLB-BMP (Basic Metabolic Panel-BMET) (80048-METABOL) TLB-CBC Platelet - w/Differential (85025-CBCD) TLB-TSH (Thyroid Stimulating Hormone) (84443-TSH)  Her updated medication list for this problem includes:    Zithromax 1 Gm Pack (Azithromycin) .Marland Kitchen... As directed  Problem # 2:  FATIGUE / MALAISE (ICD-780.79) Preceeded acute bronchitis--check CBC, and TSH.  Problem #  3:  CAD, NATIVE VESSEL (ICD-414.01) No increase in symptoms Her updated medication list for this problem includes:    Metoprolol Tartrate 25 Mg Tabs (Metoprolol tartrate) .Marland Kitchen... 1/2  tablet by mouth twice a day    Plavix 75 Mg Tabs (Clopidogrel bisulfate) .Marland Kitchen... Take 1 tablet by mouth once a day    Aspirin 81 Mg Tbec (Aspirin) ..... Once daily  Orders: TLB-BMP (Basic Metabolic Panel-BMET) (80048-METABOL) TLB-CBC Platelet - w/Differential (85025-CBCD) TLB-TSH (Thyroid Stimulating Hormone) (84443-TSH)  Problem # 4:  ATRIAL FLUTTER (ICD-427.32) remains on DAPT--not a good coumadin candidate.  No clinical recurrence. Her updated medication list for this problem includes:    Metoprolol Tartrate 25 Mg Tabs (Metoprolol tartrate) .Marland Kitchen... 1/2  tablet by mouth twice a day    Plavix 75 Mg Tabs (Clopidogrel bisulfate) .Marland Kitchen... Take 1 tablet by mouth once a day    Aspirin 81 Mg Tbec (Aspirin) ..... Once daily  Orders: EKG w/ Interpretation (93000) TLB-BMP (Basic Metabolic Panel-BMET) (80048-METABOL) TLB-CBC Platelet - w/Differential (85025-CBCD) TLB-TSH (Thyroid Stimulating Hormone) (84443-TSH)  Problem # 5:  TIA (ICD-435.9) No recurrent symptoms.    Patient Instructions: 1)  Your physician recommends that you have lab work today: CBC, BMP, TSH 2)  Your physician recommends that you continue on your current medications as directed. Please refer to the Current Medication list given to you today. 3)  Your physician wants you to follow-up in:  6 MONTHS.  You will receive a reminder letter in the mail two months in advance. If you don't receive a letter, please call our office to schedule the follow-up appointment.

## 2010-03-15 NOTE — Assessment & Plan Note (Signed)
Summary: rectal bleeding/[name]   History of Present Illness Visit Type: Follow-up Visit Primary GI MD: Desiree Sar MD Primary Lary Eckardt: Desiree Peat, MD  Requesting [name]: na Chief Complaint: Rectal bleeding, hemorrhoids, and patient states last week she started  having trouble with constipation History of Present Illness:   Patient followed by Dr. [name] for history of diarrhea predominant IBS, diverticulosis, adenomatous colon polyps and [facility]. Since her last colonoscopy in March 2011, diarrhea hasn't been a problems. In fact, patient having problems with constipation.   On Monday night (two days ago) patient had a painless, bloody BM . Stool soft at the time but she had been constipated the week prior. No rectal bleeding since. At time of her last visit patient started on Benefiber for history of diverticulosis. She couldn't tolerate Benefiber and isn't  taking anything else. She eats cereals , oatmeal, fruit.  She will be traveling for the holidays and worried about constipation, recurrent bleeding.   GI Review of Systems      Denies abdominal pain, acid reflux, belching, bloating, chest pain, dysphagia with liquids, dysphagia with solids, heartburn, loss of appetite, nausea, vomiting, vomiting blood, weight loss, and  weight gain.      Reports constipation, hemorrhoids, and  rectal bleeding.     Denies anal fissure, black tarry stools, change in bowel habit, diarrhea, diverticulosis, fecal incontinence, heme positive stool, irritable bowel syndrome, jaundice, light color stool, liver problems, and  rectal pain.    Current Medications (verified): 1)  Zolpidem Tartrate 10 Mg Tabs (Zolpidem Tartrate) .... Take 1 Tablet By Mouth At Bedtime 2)  Simvastatin 40 Mg Tabs (Simvastatin) .... Take One Tablet At Bedtime 3)  Metoprolol Tartrate 25 Mg Tabs (Metoprolol Tartrate) .... 1/2  Tablet By Mouth Twice A Day 4)  Plavix 75 Mg Tabs (Clopidogrel Bisulfate) .... Take 1 Tablet By Mouth  Once A Day 5)  Pantoprazole Sodium 40 Mg  Tbec (Pantoprazole Sodium) .... Take 1/2 Tablet Daily 6)  Aspirin 81 Mg  Tbec (Aspirin) .... Once Daily 7)  Ocuvite  Tabs (Multiple Vitamins-Minerals) .... Take 1 Tablet By Mouth Once A Day 8)  Preparation H Hydrocortisone 1 % Crea (Hydrocortisone) .... As Needed  Allergies (verified): 1)  ! Doxycycline Hyclate (Doxycycline Hyclate) 2)  ! Sulfa 3)  Macrodantin (Nitrofurantoin Macrocrystal) 4)  Septra  Past History:  Past Medical History: CAD, NATIVE VESSEL (ICD-414.01) HYPERTENSION (ICD-401.9) HYPERLIPIDEMIA (ICD-272.4) TIA (ICD-435.9) GERD (ICD-530.81) DIABETES MELLITUS, TYPE II (ICD-250.00) INTERNAL HEMORRHOIDS (ICD-455.0) COLONIC POLYPS, ADENOMATOUS Family Hx of COLON CANCER (ICD-153.9) FOOT PAIN (ICD-729.5) LEFT BUNDLE BRANCH BLOCK (ICD-426.3) OSTEOPOROSIS (ICD-733.00) OSTEOARTHRITIS (ICD-715.90) DIVERTICULOSIS, COLON (ICD-562.10)  Past Surgical History: Reviewed history from 03/01/2009 and no changes required. Tonsillectomy colon   2001 Cataract Extraction Right 5th metatarsal repair  Family History: Reviewed history from 04/07/2009 and no changes required. mother MI 15 father 93-CHF sister with breast CA Family History of Colon Cancer: Brother Family History of Heart Disease: Brother, Mother Family History of Uterine Cancer: Sister  Social History: Retired  lives alone 3 kids-all healthy Never Smoked Regular exercise-no Alcohol Use - yes-occasional wine  Review of Systems       The patient complains of arthritis/joint pain and cough.  The patient denies allergy/sinus, anemia, anxiety-new, back pain, blood in urine, breast changes/lumps, change in vision, confusion, coughing up blood, depression-new, fainting, fatigue, fever, headaches-new, hearing problems, heart murmur, heart rhythm changes, itching, menstrual pain, muscle pains/cramps, night sweats, nosebleeds, pregnancy symptoms, shortness of breath, skin rash,  sleeping problems,  sore throat, swelling of feet/legs, swollen lymph glands, thirst - excessive , urination - excessive , urination changes/pain, urine leakage, vision changes, and voice change.    Vital Signs:  Patient profile:   77 year old female Height:      66 inches Weight:      130 pounds BMI:     21.06 BSA:     1.67 Pulse rate:   76 / minute Pulse rhythm:   regular BP sitting:   124 / 58  (left arm) Cuff size:   regular  Vitals Entered By: Ok Anis CMA (December 26, 2009 10:29 AM)  Physical Exam  General:  Well developed, well nourished, no acute distress. Head:  Normocephalic and atraumatic. Eyes:  Conjunctiva pink, no icterus.  Neck:  no obvious masses  Lungs:  Clear throughout to auscultation. Heart:  RRR Abdomen:  Abdomen soft, nontender, nondistended. No obvious masses or hepatomegaly.Normal bowel sounds.  Rectal:  Large protruding hemorrhoid with some dried blood covering it. Hemorrhoid firm but not rock hard. No internal masses felt. Msk:  Symmetrical with no gross deformities. Normal posture. Extremities:  No palmar erythema, no edema.  Neurologic:  Alert and  oriented x4;  grossly normal neurologically. Skin:  Intact without significant lesions or rashes. Psych:  Alert and cooperative. Normal mood and affect.   Impression & Recommendations:  Problem # 1:  HEMORRHOIDS-INTERNAL (ICD-455.0) Painless rectal bleeding after several days of constipation. Large, protruding hemorrhoid on exam today. Start steroid suppositories, avoid constipation. She couldn't tolerate Benefiber. Recommended Miralax but use cautiously given history of diarrhea predominant IBS. When traveling over holidays, Dulcolax suppositories may give her a more predictable response.    Problem # 2:  COLONIC POLYPS, ADENOMATOUS (ICD-211.3) Assessment: Comment Only Last surveillance colonoscopy March 2011  Patient Instructions: 1)  We sent a prescription for suppositories to your pharmacy,  [facility]. 2)  Use Miralax 1 capful daily and you may use twice daily to help avoid constipation. 3)  If thinks have not improved in a few weeks call us.  4)  Avoid constipation.  5)  Copy sent to : Desiree Peat, MD 6)  The medication list was reviewed and reconciled.  All changed / newly prescribed medications were explained.  A complete medication list was provided to the patient / caregiver. Prescriptions: HYDROCORTISONE ACETATE 25 MG SUPP (HYDROCORTISONE ACETATE) Use 1 suppository at bedtime for 7 days.  #7 x 2   Entered by:   Lowry Ram NCMA   Authorized by:   Willette Cluster NP   Signed by:   Lowry Ram NCMA on 12/26/2009   Method used:   Electronically to        [facility]  [phone]* (retail)       [address]       [place], [place]  [address]       Ph: [phone] or [phone]       Fax: [phone]   RxID:   [id number]

## 2010-03-15 NOTE — Progress Notes (Signed)
Summary: left foot pain  Phone Note Call from Patient Call back at [facility] Phone [phone]   Caller: Patient Complaint: Headache Summary of Call: Pt request call Initial call taken by: Judie Grieve,  September 26, 2009 4:12 PM  Follow-up for Phone Call        woke this at 3am with shooting pain in  left foot. Pain is not constant, acts up with rest but doesnt notice with activity.  Pain in ankle, denies injury, bug bites or any trauma, some swelling, no redness.  when pain occurs she cant even touch it.  wants to see Dr Riley Kill but she is awre he is not in the office until Friday.  Suggested the pt call/see Dr Cato Mulligan but she refuses at this time.  Encoaraged pt to use cool compresses and Tylenol as needed.  Pt states understanding.  Will see Dr Riley Kill Friday unless pain resolves. Follow-up by: Charolotte Capuchin, RN,  September 26, 2009 5:11 PM  Additional Follow-up for Phone Call Additional follow up Details #1::        I called patient to check on status.  She was much improved almost immediately, and cancelled appointment, with the idea that she did not need to have further evaluation.  Doing well currently, and wants to see me later in the fall.  TS Additional Follow-up by: Ronaldo Miyamoto, MD, [facility],  October 08, 2009 4:52 PM

## 2010-03-15 NOTE — Assessment & Plan Note (Signed)
Summary: 4 MONTH ROV/NJR   Vital Signs:  Patient profile:   77 year old female Weight:      130 pounds BMI:     21.06 Temp:     97.8 degrees F oral Pulse rate:   72 / minute Pulse rhythm:   regular Resp:     12 per minute BP sitting:   100 / 50  (left arm) Cuff size:   regular  Vitals Entered By: Gladis Riffle, RN (September 25, 2009 9:02 AM) CC: 4 month rov, labs done Is Patient Diabetic? Yes Did you bring your meter with you today? No Comments does not check CBGs at home   Primary Care Provider:  Birdie Sons MD  CC:  4 month rov and labs done.  History of Present Illness:  Follow-Up Visit      This is an 77 year old woman who presents for Follow-up visit.  The patient denies chest pain and palpitations.  Since the last visit the patient notes no new problems or concerns.  The patient reports taking meds as prescribed.  When questioned about possible medication side effects, the patient notes none.   she has chronic trouble with sleep: takes [place] every night All other systems reviewed and were negative   Preventive Screening-Counseling & Management  Alcohol-Tobacco     Smoking Status: never  Current Medications (verified): 1)  Zolpidem Tartrate 10 Mg Tabs (Zolpidem Tartrate) .... Take 1 Tablet By Mouth At Bedtime 2)  Simvastatin 40 Mg Tabs (Simvastatin) .... Take One Tablet At Bedtime 3)  Metoprolol Tartrate 25 Mg Tabs (Metoprolol Tartrate) .... 1/2  Tablet By Mouth Twice A Day 4)  Plavix 75 Mg Tabs (Clopidogrel Bisulfate) .... Take 1 Tablet By Mouth Once A Day 5)  Pantoprazole Sodium 40 Mg  Tbec (Pantoprazole Sodium) .... Take 1/2 Tablet Daily 6)  Aspirin 81 Mg  Tbec (Aspirin) .... Once Daily 7)  Ocuvite  Tabs (Multiple Vitamins-Minerals) .... Take 1 Tablet By Mouth Once A Day  Allergies: 1)  ! Doxycycline Hyclate (Doxycycline Hyclate) 2)  ! Sulfa 3)  Macrodantin (Nitrofurantoin Macrocrystal) 4)  Septra (Sulfamethoxazole-Trimethoprim)  Past History:  Past Medical  History: Last updated: 04/07/2009 Current Problems:  CAD, NATIVE VESSEL (ICD-414.01) HYPERTENSION (ICD-401.9) HYPERLIPIDEMIA (ICD-272.4) TIA (ICD-435.9) GERD (ICD-530.81) DIABETES MELLITUS, TYPE II (ICD-250.00) INTERNAL HEMORRHOIDS (ICD-455.0) COLONIC POLYPS, HYPERPLASTIC, HX OF (ICD-V12.72) Family Hx of COLON CANCER (ICD-153.9) FOOT PAIN (ICD-729.5) LEFT BUNDLE BRANCH BLOCK (ICD-426.3) OSTEOPOROSIS (ICD-733.00) OSTEOARTHRITIS (ICD-715.90) DIVERTICULOSIS, COLON (ICD-562.10)  Past Surgical History: Last updated: 03/01/2009 Tonsillectomy colon   2001 Cataract Extraction Right 5th metatarsal repair  Family History: Last updated: 04/07/2009 mother MI 47 father 93-CHF sister with breast CA Family History of Colon Cancer: Brother Family History of Heart Disease: Brother, Mother Family History of Uterine Cancer: Sister  Social History: Last updated: 04/07/2009 lives alone 3 kids-all healthy Never Smoked Regular exercise-no Alcohol Use - yes-occasional wine  Risk Factors: Exercise: no (09/09/2006)  Risk Factors: Smoking Status: never (09/25/2009)  Physical Exam  General:  alert and well-developed.   Head:  normocephalic and atraumatic.   Eyes:  pupils equal and pupils round.   Ears:  R ear normal and L ear normal.   Neck:  No deformities, masses, or tenderness noted. Chest Wall:  no deformities and no tenderness.   Lungs:  normal respiratory effort and no intercostal retractions.   Heart:  normal rate and regular rhythm.   Abdomen:  soft and non-tender.   Msk:  No deformity or scoliosis noted  of thoracic or lumbar spine.   Neurologic:  cranial nerves II-XII intact and gait normal.   Skin:  turgor normal and color normal.   Psych:  good eye contact and not anxious appearing.    Diabetes Management Exam:       Nails:          Left foot: thickened          Right foot: thickened    Eye Exam:       Eye Exam done elsewhere          Date: 02/11/2009           Results: normal          Done by: ophthal   Impression & Recommendations:  Problem # 1:  HYPERTENSION (ICD-401.9) controlled continue current medications  Her updated medication list for this problem includes:    Metoprolol Tartrate 25 Mg Tabs (Metoprolol tartrate) .Marland Kitchen... 1/2  tablet by mouth twice a day  BP today: 100/50 Prior BP: 124/54 (08/31/2009)  Prior 10 Yr Risk Heart Disease: N/A (09/25/2006)  Labs Reviewed: K+: 4.9 (09/18/2009) Creat: : 0.9 (09/18/2009)   Chol: 138 (09/18/2009)   HDL: 42.80 (09/18/2009)   LDL: 71 (09/18/2009)   TG: 122.0 (09/18/2009)  Problem # 2:  HYPERLIPIDEMIA (ICD-272.4) controlled continue current medications  Her updated medication list for this problem includes:    Simvastatin 40 Mg Tabs (Simvastatin) .Marland Kitchen... Take one tablet at bedtime  Labs Reviewed: SGOT: 25 (09/18/2009)   SGPT: 21 (09/18/2009)  Lipid Goals: Chol Goal: 200 (09/25/2006)   HDL Goal: 40 (09/25/2006)   LDL Goal: 70 (09/25/2006)   TG Goal: 150 (09/25/2006)  Prior 10 Yr Risk Heart Disease: N/A (09/25/2006)   HDL:42.80 (09/18/2009), 49.60 (05/15/2009)  LDL:71 (09/18/2009), 49 (05/15/2009)  Chol:138 (09/18/2009), 115 (05/15/2009)  Trig:122.0 (09/18/2009), 81.0 (05/15/2009)  Problem # 3:  DIABETES MELLITUS, TYPE II (ICD-250.00) well controlled by diet Her updated medication list for this problem includes:    Aspirin 81 Mg Tbec (Aspirin) ..... Once daily  Labs Reviewed: Creat: 0.9 (09/18/2009)     Last Eye Exam: normal (02/11/2009) Reviewed HgBA1c results: 6.6 (09/18/2009)  6.6 (05/15/2009)  Problem # 4:  TIA (ICD-435.9) no recurrence Her updated medication list for this problem includes:    Plavix 75 Mg Tabs (Clopidogrel bisulfate) .Marland Kitchen... Take 1 tablet by mouth once a day    Aspirin 81 Mg Tbec (Aspirin) ..... Once daily  Complete Medication List: 1)  Zolpidem Tartrate 10 Mg Tabs (Zolpidem tartrate) .... Take 1 tablet by mouth at bedtime 2)  Simvastatin 40 Mg Tabs  (Simvastatin) .... Take one tablet at bedtime 3)  Metoprolol Tartrate 25 Mg Tabs (Metoprolol tartrate) .... 1/2  tablet by mouth twice a day 4)  Plavix 75 Mg Tabs (Clopidogrel bisulfate) .... Take 1 tablet by mouth once a day 5)  Pantoprazole Sodium 40 Mg Tbec (Pantoprazole sodium) .... Take 1/2 tablet daily 6)  Aspirin 81 Mg Tbec (Aspirin) .... Once daily 7)  Ocuvite Tabs (Multiple vitamins-minerals) .... Take 1 tablet by mouth once a day  Other Orders: TD Toxoids IM 7 YR + (28413) Admin 1st Vaccine (24401)  Patient Instructions: 1)  Please schedule a follow-up appointment in 4 months. 2)  labs one week prior to visit 3)  lipids---272.4 4)  lfts-995.2 5)  bmet-995.2 6)  A1C-250.02 7)       Immunizations Administered:  Tetanus Vaccine:    Vaccine Type: Td    Site: left deltoid    Mfr:  Sanofi Pasteur    Dose: 0.5 ml    Route: IM    Given by: Gladis Riffle, RN    Exp. Date: 03/15/2011    Lot #: [id number]

## 2010-03-15 NOTE — Progress Notes (Signed)
Summary: Transfer of care to Dr Caryl Never request, approved  Phone Note Call from Patient   Caller: Patient Call For: Birdie Sons MD Complaint: Cough/Sore throat Summary of Call: Pt here for follow-up.  States she would like to request official transfer of care to Dr Caryl Never.  "I love Dr Cato Mulligan, however I need someone more available to me". Initial call taken by: Sid Falcon LPN,  December 20, 2009 1:23 PM  Follow-up for Phone Call        fine with me Follow-up by: Birdie Sons MD,  December 20, 2009 1:26 PM  Additional Follow-up for Phone Call Additional follow up Details #1::        OK with me. Additional Follow-up by: [name] MD,  December 20, 2009 6:06 PM    Additional Follow-up for Phone Call Additional follow up Details #2::    Pt informed Follow-up by: Sid Falcon LPN,  December 21, 2009 9:18 AM

## 2010-03-15 NOTE — Letter (Signed)
Summary: Patient Notice- Polyp Results   Gastroenterology  [address] [place], [place] [address]   Phone: [phone]  Fax: [phone]        May 08, 2009 MRN: [id number]    [facility] [address] [place], [place]  [address]    Dear Ms. Likes,  I am pleased to inform you that the colon polyp(s) removed during your recent colonoscopy was (were) found to be benign (no cancer detected) upon pathologic examination.Your polyps were adenomatous ( precancerous)    Should you develop new or worsening symptoms of abdominal pain, bowel habit changes or bleeding from the rectum or bowels, please schedule an evaluation with either your primary care physician or with me.  Additional information/recommendations:  _x_ No further action with gastroenterology is needed at this time. Please      follow-up with your primary care physician for your other healthcare      needs.  _x_ Please call [phone] to schedule a return visit to review your      situation.  __ Please keep your follow-up visit as already scheduled.  __ Continue treatment plan as outlined the day of your exam.  Please call us if you are having persistent problems or have questions about your condition that have not been fully answered at this time.  Sincerely,  Hart Carwin MD  This letter has been electronically signed by your physician.  Appended Document: Patient Notice- Polyp Results letter mailed 3.30.11

## 2010-03-15 NOTE — Progress Notes (Signed)
Summary: Pt req new script for Plavix 3 mos 90day suppy to [facility]  Phone Note Call from Patient Call back at [facility] Phone [phone]   Caller: Patient Summary of Call: Pt called is is req a new script for Plavix for 3 month 90 day supply.  Pt req for 1 year.  to be sent [employer]. Please call and notify pt when this has been done.  Initial call taken by: Lucy Antigua,  February 24, 2009 11:50 AM  Follow-up for Phone Call        see Rx.Patient notified.  Follow-up by: Gladis Riffle, RN,  February 24, 2009 3:32 PM    Prescriptions: PLAVIX 75 MG TABS (CLOPIDOGREL BISULFATE) Take 1 tablet by mouth once a day  #90 x 3   Entered by:   Gladis Riffle, RN   Authorized by:   Birdie Sons MD   Signed by:   Gladis Riffle, RN on 02/24/2009   Method used:   Electronically to        [facility]* (mail-order)             ,          Ph: [phone]       Fax: [phone]   RxID:   [id number]

## 2010-03-15 NOTE — Assessment & Plan Note (Signed)
Summary: Gastroenterology    MR#:   Page #1  NAME: [name], [name]    OFFICE NO: [id number]  DATE:  07/24/04  DOB: 08-03-2020  The patient is a very nice 77 year old white female who has family history of colon cancer and we have followed her for hyperplastic colon polyps with colonoscopies, last one in May 2005.  She comes today with intermittent rectal bleeding.  This, of course, with bowel movement and almost on a daily basis.  We had diagnosed her with internal hemorrhoids on last colonoscopy.  MEDICATIONS:  Include Fosamax, Prevacid, and Ambien.  PHYSICAL EXAMINATION:  Blood pressure 110/58, pulse 74, and weight 135 pounds.  Her abdominal exam was unremarkable.  Lungs were clear to auscultation.  Cor with normal S1, normal S2.  Anoscopic exam showed large 2nd-degree hemorrhoid with stigmata of bleeding, quite congested.  IMPRESSION: 1.   An 77 year old white female with rectal bleeding most likely related to a symptomatic 2nd-grade hemorrhoid. 2.   Family history of colon cancer. 3.   Personal history of hyperplastic polyps.  PLAN: 1.   Patient is due for repeat colonoscopy. 2.   Will schedule for banding of the hemorrhoid. 3.   Anusol-HC suppository to use q.h.s.                  Hedwig Morton. [name], M.D.  DMB/fr/gd cc:  Dr. Cato Mulligan D:  07/24/04; T:  07/24/04; Job [phone]

## 2010-03-15 NOTE — Assessment & Plan Note (Signed)
Summary: follow up on pneumonia/cjr   Vital Signs:  Patient profile:   77 year old female Weight:      132 pounds Temp:     97.7 degrees F oral BP sitting:   120 / 60  (left arm) Cuff size:   regular  Vitals Entered By: Sid Falcon LPN (December 20, 2009 1:15 PM)  History of Present Illness: Patient is seen today regarding complaints of some dyspnea mostly with activity since recent respiratory infection. No confirmed pneumonia but she had presented here with some bibasilar rales and was treated with Avelox (after worsening of symptoms on Zithromax per Urgent Care).   At this point she denies any significant cough, pleuritic pain, or hemoptysis. She has some dyspnea with activity but no chest pain. Nonsmoker. No orthopnea. No pedal edema.  Recent chest x-ray revealed no acute findings. She feels somewhat fatigued. She has normocytic anemia with hemoglobin 10 range. No history of asthma. No chronic lung disease.  No hx of smoking but second hand exposure for years.  Recent CXR comments of hyperinflation c/w COPD.  Allergies: 1)  ! Doxycycline Hyclate (Doxycycline Hyclate) 2)  ! Sulfa 3)  Macrodantin (Nitrofurantoin Macrocrystal) 4)  Septra  Past History:  Past Medical History: Last updated: 04/07/2009 Current Problems:  CAD, NATIVE VESSEL (ICD-414.01) HYPERTENSION (ICD-401.9) HYPERLIPIDEMIA (ICD-272.4) TIA (ICD-435.9) GERD (ICD-530.81) DIABETES MELLITUS, TYPE II (ICD-250.00) INTERNAL HEMORRHOIDS (ICD-455.0) COLONIC POLYPS, HYPERPLASTIC, HX OF (ICD-V12.72) Family Hx of COLON CANCER (ICD-153.9) FOOT PAIN (ICD-729.5) LEFT BUNDLE BRANCH BLOCK (ICD-426.3) OSTEOPOROSIS (ICD-733.00) OSTEOARTHRITIS (ICD-715.90) DIVERTICULOSIS, COLON (ICD-562.10)  Past Surgical History: Last updated: 03/01/2009 Tonsillectomy colon   2001 Cataract Extraction Right 5th metatarsal repair  Family History: Last updated: 04/07/2009 mother MI 12 father 93-CHF sister with breast CA Family  History of Colon Cancer: Brother Family History of Heart Disease: Brother, Mother Family History of Uterine Cancer: Sister  Social History: Last updated: 04/07/2009 lives alone 3 kids-all healthy Never Smoked Regular exercise-no Alcohol Use - yes-occasional wine  Risk Factors: Exercise: no (09/09/2006)  Risk Factors: Smoking Status: never (09/25/2009)  Review of Systems       The patient complains of dyspnea on exertion.  The patient denies anorexia, fever, weight loss, hoarseness, chest pain, syncope, peripheral edema, prolonged cough, hemoptysis, abdominal pain, melena, hematochezia, severe indigestion/heartburn, depression, and enlarged lymph nodes.    Physical Exam  General:  Well-developed,well-nourished,in no acute distress; alert,appropriate and cooperative throughout examination Mouth:  Oral mucosa and oropharynx without lesions or exudates.  Teeth in good repair. Neck:  No deformities, masses, or tenderness noted. Lungs:  Normal respiratory effort, chest expands symmetrically. Lungs are clear to auscultation, no crackles or wheezes. Heart:  normal rate, regular rhythm, and no gallop.   Extremities:  no edema Neurologic:  alert & oriented X3 and cranial nerves II-XII intact.   Skin:  no rashes.   Cervical Nodes:  No lymphadenopathy noted   Impression & Recommendations:  Problem # 1:  DYSPNEA (ICD-786.05) ?etiology.  No evid for CHF.  no evid for acute finding on recent CXR. No clinical susp of PE.  Comment of hyperinflation on recent CXR. No evid for ongoing infection.  Eval with spirometry.  Orders: Spirometry w/Graph (94010)  Problem # 2:  ANEMIA, NORMOCYTIC (ICD-285.9) check additonal labs below but likely has chronic normocytic anemia. Orders: Venipuncture (60454) Specimen Handling (09811) TLB-B12, Serum-Total ONLY (91478-G95) TLB-Folic Acid (Folate) (82746-FOL) TLB-Ferritin (82728-FER) TLB-IBC Pnl (Iron/FE;Transferrin) (83550-IBC)  Problem # 3:  CAD,  NATIVE VESSEL (ICD-414.01)  Her updated  medication list for this problem includes:    Metoprolol Tartrate 25 Mg Tabs (Metoprolol tartrate) .Marland Kitchen... 1/2  tablet by mouth twice a day    Plavix 75 Mg Tabs (Clopidogrel bisulfate) .Marland Kitchen... Take 1 tablet by mouth once a day    Aspirin 81 Mg Tbec (Aspirin) ..... Once daily  Problem # 4:  HYPERTENSION (ICD-401.9)  Her updated medication list for this problem includes:    Metoprolol Tartrate 25 Mg Tabs (Metoprolol tartrate) .Marland Kitchen... 1/2  tablet by mouth twice a day  Orders: TLB-BMP (Basic Metabolic Panel-BMET) (80048-METABOL)  Problem # 5:  DIABETES MELLITUS, TYPE II (ICD-250.00) repeat A1C. Her updated medication list for this problem includes:    Aspirin 81 Mg Tbec (Aspirin) ..... Once daily  Orders: Venipuncture (16109) Specimen Handling (60454) TLB-A1C / Hgb A1C (Glycohemoglobin) (83036-A1C)  Complete Medication List: 1)  Zolpidem Tartrate 10 Mg Tabs (Zolpidem tartrate) .... Take 1 tablet by mouth at bedtime 2)  Simvastatin 40 Mg Tabs (Simvastatin) .... Take one tablet at bedtime 3)  Metoprolol Tartrate 25 Mg Tabs (Metoprolol tartrate) .... 1/2  tablet by mouth twice a day 4)  Plavix 75 Mg Tabs (Clopidogrel bisulfate) .... Take 1 tablet by mouth once a day 5)  Pantoprazole Sodium 40 Mg Tbec (Pantoprazole sodium) .... Take 1/2 tablet daily 6)  Aspirin 81 Mg Tbec (Aspirin) .... Once daily 7)  Ocuvite Tabs (Multiple vitamins-minerals) .... Take 1 tablet by mouth once a day  Patient Instructions: 1)  Please schedule a follow-up appointment in 1 month.    Orders Added: 1)  Spirometry w/Graph [94010] 2)  Venipuncture [09811] 3)  Specimen Handling [99000] 4)  TLB-BMP (Basic Metabolic Panel-BMET) [80048-METABOL] 5)  TLB-A1C / Hgb A1C (Glycohemoglobin) [83036-A1C] 6)  TLB-B12, Serum-Total ONLY [82607-B12] 7)  TLB-Folic Acid (Folate) [82746-FOL] 8)  TLB-Ferritin [82728-FER] 9)  TLB-IBC Pnl (Iron/FE;Transferrin) [83550-IBC] 10)  Est. Patient  Level IV [91478]

## 2010-03-15 NOTE — Assessment & Plan Note (Signed)
Summary: EPISODES OF DIARRHEA, CONSTIPATION AND GAS      [name]   History of Present Illness Visit Type: Initial Consult Primary GI MD: [name] MD Primary Provider: Birdie Sons MD Requesting Provider: Birdie Sons, MD Chief Complaint: Diarrhea, constipation , gas x 3 months History of Present Illness:   This is an 77 year old white female who has irritable bowel syndrome with predominant diarrhea and occasional incontinence. She comes with the same complaint today. She denies any abdominal pain but has some problems with  hemorrhoids. Patient is on Plavix.  Her last colonoscopy in 2006 for rectal bleeding showed a hyperplastic polyp. A recall colonoscopy will be due this year. She  had 4 loose bowel movements this morning. Additional problems include diabetes, vitamin B 12 deficiency and acid reflux.   GI Review of Systems    Reports bloating and  weight loss.      Denies abdominal pain, acid reflux, belching, chest pain, dysphagia with liquids, dysphagia with solids, heartburn, loss of appetite, nausea, vomiting, vomiting blood, and  weight gain.      Reports change in bowel habits, constipation, diarrhea, hemorrhoids, and  rectal bleeding.     Denies anal fissure, black tarry stools, diverticulosis, fecal incontinence, heme positive stool, irritable bowel syndrome, jaundice, light color stool, liver problems, and  rectal pain.    Current Medications (verified): 1)  Zolpidem Tartrate 10 Mg Tabs (Zolpidem Tartrate) .... Take 1 Tablet By Mouth At Bedtime 2)  Simvastatin 40 Mg Tabs (Simvastatin) .... Take One Tablet At Bedtime 3)  Metoprolol Tartrate 25 Mg Tabs (Metoprolol Tartrate) .... 1/2  Tablet By Mouth Twice A Day 4)  Plavix 75 Mg Tabs (Clopidogrel Bisulfate) .... Take 1 Tablet By Mouth Once A Day 5)  Pantoprazole Sodium 40 Mg  Tbec (Pantoprazole Sodium) .... Take 1/2 Tablet Daily 6)  Fish Oil 1000 Mg Caps (Omega-3 Fatty Acids) .... Once Daily 7)  Aspirin 81 Mg  Tbec (Aspirin)  .... Once Daily 8)  Multivitamins   Tabs (Multiple Vitamin) .... Once Daily 9)  Vitamin C Cr 500 Mg  Cr-Caps (Ascorbic Acid) .... Once Daily 10)  Cinnamon 500 Mg  Caps (Cinnamon) .... Once Daily 11)  Vitamin B-6 100 Mg  Tabs (Pyridoxine Hcl) .... Once Daily 12)  Caltrate 600+d 600-400 Mg-Unit Tabs (Calcium Carbonate-Vitamin D) .... Take 1 Tablet By Mouth Two Times A Day 13)  Vitamin D 400 Unit Tabs (Cholecalciferol) .... Take 1 Tablet By Mouth Once A Day 14)  Vitamin E-400 400 Unit Caps (Vitamin E) .... Take 1 Capsule By Mouth Once A Day 15)  Ocuvite  Tabs (Multiple Vitamins-Minerals) .... Take 1 Tablet By Mouth Once A Day 16)  Glucosamine-Msm 500-400 Mg Caps (Glucosamine Sulfate-Msm) .... Take 1 Capsule By Mouth Two Times A Day 17)  Vitamin B-12 500 Mcg  Tabs (Cyanocobalamin) .... Take 1 Tablet By Mouth Once A Day 18)  Ginkoba 40 Mg Tabs (Ginkgo Biloba) .... Once Daily  Allergies (verified): 1)  ! Doxycycline Hyclate (Doxycycline Hyclate) 2)  ! Sulfa 3)  Macrodantin (Nitrofurantoin Macrocrystal) 4)  Septra (Sulfamethoxazole-Trimethoprim)  Past History:  Past Medical History: Reviewed history from 04/27/2008 and no changes required. Diabetes mellitus, type II Diverticulosis, colon Osteoarthritis Osteoporosis LBBB fam hx cancer of colon,uterus, breast Transient ischemic attack, hx of Hyperlipidemia Hypertension GERD CAD Palpitations Hemorrhoids  Past Surgical History: Reviewed history from 03/01/2009 and no changes required. Tonsillectomy colon   2001 Cataract Extraction Right 5th metatarsal repair  Social History: Reviewed history from  03/01/2009 and no changes required. lives alone 3 kids-all healthy Never Smoked Regular exercise-no Alcohol Use - yes-occasional wine  Review of Systems       The patient complains of hearing problems and sleeping problems.         Pertinent positive and negative review of systems were noted in the above HPI. All other ROS was  otherwise negative.   Vital Signs:  Patient profile:   77 year old female Height:      66 inches Weight:      312.50 pounds Pulse rate:   80 / minute Pulse rhythm:   regular BP sitting:   142 / 68  (left arm) Cuff size:   regular  Vitals Entered By: June McMurray CMA Duncan Dull) (March 06, 2009 2:57 PM)  Physical Exam  General:  Well developed, well nourished, no acute distress. Eyes:  PERRLA, no icterus. Neck:  Supple; no masses or thyromegaly. Lungs:  Clear throughout to auscultation. Heart:  Regular rate and rhythm; no murmurs, rubs,  or bruits. Abdomen:  soft with normoactive bowel sounds. No tenderness. No palpable mass. Liver edge at costal margin. Rectal:  normal rectal tone with a small amount of Hemoccult-negative stool in the ampulla. There was a small skin tag protruding through the anal canal. Extremities:  No clubbing, cyanosis, edema or deformities noted. Skin:  Intact without significant lesions or rashes. Psych:  Alert and cooperative. Normal mood and affect.   Impression & Recommendations:  Problem # 1:  DIVERTICULOSIS, COLON (ICD-562.10) Patient has irritable bowel syndrome with predominant diarrhea and a history of diverticulosis. Her symptoms suggest an exacerbation of IBS. We will start her on Benefiber one heaping teaspoon daily and add Bentyl 10 mg q.a.m. We have discussed a high-fiber, low-fat diet.  Problem # 2:  Family Hx of COLON CANCER (ICD-153.9)  Patient has a history of colon cancer in her brother. She is due for a colonoscopy. He will schedule that at this time.  Orders: Colonoscopy (Colon)  Problem # 3:  COLONIC POLYPS, HYPERPLASTIC, HX OF (ICD-V12.72)  Patient had a hyperplastic polyp in 2006. She is due for a repeat colonoscopy.  Orders: Colonoscopy (Colon)  Patient Instructions: 1)  Benefiber 1 teaspoon daily. 2)  Bentyl 10 mg p.o. q.a.m. and one q.p.m. p.r.n. 3)  High-fiber diet. 4)  Colonoscopy scheduled. 5)  Copy sent to : Dr  B.Swords Prescriptions: BENTYL 10 MG CAPS (DICYCLOMINE HCL) Take 1 capsule by mouth every morning and as needed in the afternoon.  #60 x 2   Entered by:   Hortense Ramal CMA (AAMA)   Authorized by:   Hart Carwin MD   Signed by:   Hortense Ramal CMA (AAMA) on 03/06/2009   Method used:   Electronically to        [facility]  [phone]* (retail)       [address]       [place], [place]  [address]       Ph: [phone] or [phone]       Fax: [phone]   RxID:   [id number] DULCOLAX 5 MG  TBEC (BISACODYL) Day before procedure take 2 at 3pm and 2 at 8pm.  #4 x 0   Entered by:   Hortense Ramal CMA (AAMA)   Authorized by:   Hart Carwin MD   Signed by:   Hortense Ramal CMA (AAMA) on 03/06/2009   Method used:   Electronically to  [facility]  [address]  [phone]* (retail)       [address]       [place], [place]  [address]       Ph: [phone] or [phone]       Fax: [phone]   RxID:   [id number] REGLAN 10 MG  TABS (METOCLOPRAMIDE HCL) As per prep instructions.  #2 x 0   Entered by:   Hortense Ramal CMA (AAMA)   Authorized by:   Hart Carwin MD   Signed by:   Hortense Ramal CMA (AAMA) on 03/06/2009   Method used:   Electronically to        [facility]  [phone]* (retail)       [address]       [place], [place]  [address]       Ph: [phone] or [phone]       Fax: [phone]   RxID:   [id number] MIRALAX   POWD (POLYETHYLENE GLYCOL 3350) As per prep  instructions.  #255 grams x 0   Entered by:   Hortense Ramal CMA (AAMA)   Authorized by:   Hart Carwin MD   Signed by:   Hortense Ramal CMA (AAMA) on 03/06/2009   Method used:   Electronically to        [facility]  [phone]* (retail)       [address]       [place], [place]  [address]       Ph: [phone] or [phone]       Fax: [phone]   RxID:    [id number]

## 2010-03-15 NOTE — Progress Notes (Signed)
Summary: Nose bleed  Phone Note Call from Patient Call back at Home Phone [phone]   Caller: Patient Reason for Call: Talk to Nurse Summary of Call: pt will like to speak with the nurse re her visit with Dr. Riley Kill yesterday. Initial call taken by: Roe Coombs,  November 28, 2009 10:40 AM  Follow-up for Phone Call        I spoke with the pt and she had a horrible nose bleed last night.  The pt has had a cold and has been wiping her nose frequently.  The pt said the bleed has stopped at this time and she did not take her Plavix and ASA this morning.  After reviewing the pt's hospital records her Plavix was started in July 2008 due to TIA.  The pt does not see a neurologist or ENT.  I instructed the pt to make Dr Cato Mulligan aware of her nosebleed and then they can give her further instruction on Plavix and ASA.  I also reviewed the pt's lab results and made her aware of abnormal WBC and H & H.  Pt agreed to contact PCP. Follow-up by: Julieta Gutting, RN, BSN,  November 28, 2009 11:04 AM     Appended Document: Nose bleed I called patient and discussed in detail.  Stopped it using Kleenex.  I told her if it recurred before seeing Dr. Cato Mulligan, then she should proceed to ER with someone driving her, or EMS.  She is on DAPT for history of CVA.  All of this may need to be reconsidered.  She has an interest in seeing ENT.  TS

## 2010-03-15 NOTE — Assessment & Plan Note (Signed)
Summary: procedure f/u...as.   History of Present Illness Visit Type: Follow-up Visit Primary GI MD: [name] MD Primary Provider: Birdie Sons MD Requesting Provider: Birdie Sons, MD Chief Complaint: Follow up from colonoscopy, No GI complaints History of Present Illness:   This is an 77 year old white female who is here to discuss results of her colonoscopy which was done in March 2011. She had to leave town to attend to her brother in [place]. She has a history of irritable bowel syndrome with predominant diarrhea. Her symptoms of incontinence and diarrhea have markedly improved since  the colonoscopy. She had 2 adenomatous polyps and diverticulosis of the left colon. An endo clip was applied to the post-polypectomy site. A prior colonoscopy in 2006 also showed diverticulosis. She has a family history of colon cancer in her brother. Due to her age of 77, there are no plans for followup colonoscopy.   GI Review of Systems      Denies abdominal pain, acid reflux, belching, bloating, chest pain, dysphagia with liquids, dysphagia with solids, heartburn, loss of appetite, nausea, vomiting, vomiting blood, weight loss, and  weight gain.        Denies anal fissure, black tarry stools, change in bowel habit, constipation, diarrhea, diverticulosis, fecal incontinence, heme positive stool, hemorrhoids, irritable bowel syndrome, jaundice, light color stool, liver problems, rectal bleeding, and  rectal pain.    Current Medications (verified): 1)  Zolpidem Tartrate 10 Mg Tabs (Zolpidem Tartrate) .... Take 1 Tablet By Mouth At Bedtime 2)  Simvastatin 40 Mg Tabs (Simvastatin) .... Take One Tablet At Bedtime 3)  Metoprolol Tartrate 25 Mg Tabs (Metoprolol Tartrate) .... 1/2  Tablet By Mouth Twice A Day 4)  Plavix 75 Mg Tabs (Clopidogrel Bisulfate) .... Take 1 Tablet By Mouth Once A Day 5)  Pantoprazole Sodium 40 Mg  Tbec (Pantoprazole Sodium) .... Take 1/2 Tablet Daily 6)  Aspirin 81 Mg  Tbec  (Aspirin) .... Once Daily 7)  Ocuvite  Tabs (Multiple Vitamins-Minerals) .... Take 1 Tablet By Mouth Once A Day  Allergies (verified): 1)  ! Doxycycline Hyclate (Doxycycline Hyclate) 2)  ! Sulfa 3)  Macrodantin (Nitrofurantoin Macrocrystal) 4)  Septra (Sulfamethoxazole-Trimethoprim)  Past History:  Past Medical History: Last updated: 04/07/2009 Current Problems:  CAD, NATIVE VESSEL (ICD-414.01) HYPERTENSION (ICD-401.9) HYPERLIPIDEMIA (ICD-272.4) TIA (ICD-435.9) GERD (ICD-530.81) DIABETES MELLITUS, TYPE II (ICD-250.00) INTERNAL HEMORRHOIDS (ICD-455.0) COLONIC POLYPS, HYPERPLASTIC, HX OF (ICD-V12.72) Family Hx of COLON CANCER (ICD-153.9) FOOT PAIN (ICD-729.5) LEFT BUNDLE BRANCH BLOCK (ICD-426.3) OSTEOPOROSIS (ICD-733.00) OSTEOARTHRITIS (ICD-715.90) DIVERTICULOSIS, COLON (ICD-562.10)  Past Surgical History: Last updated: 03/01/2009 Tonsillectomy colon   2001 Cataract Extraction Right 5th metatarsal repair  Family History: Last updated: 04/07/2009 mother MI 37 father 93-CHF sister with breast CA Family History of Colon Cancer: Brother Family History of Heart Disease: Brother, Mother Family History of Uterine Cancer: Sister  Social History: Last updated: 04/07/2009 lives alone 3 kids-all healthy Never Smoked Regular exercise-no Alcohol Use - yes-occasional wine  Review of Systems  The patient denies allergy/sinus, anemia, anxiety-new, arthritis/joint pain, back pain, blood in urine, breast changes/lumps, change in vision, confusion, cough, coughing up blood, depression-new, fainting, fatigue, fever, headaches-new, hearing problems, heart murmur, heart rhythm changes, itching, menstrual pain, muscle pains/cramps, night sweats, nosebleeds, pregnancy symptoms, shortness of breath, skin rash, sleeping problems, sore throat, swelling of feet/legs, swollen lymph glands, thirst - excessive , urination - excessive , urination changes/pain, urine leakage, vision changes, and  voice change.         Pertinent positive and negative review  of systems were noted in the above HPI. All other ROS was otherwise negative.   Vital Signs:  Patient profile:   77 year old female Height:      66 inches Weight:      130 pounds BMI:     21.06 BSA:     1.67 Pulse rate:   60 / minute Pulse rhythm:   regular BP sitting:   124 / 54  (left arm)  Vitals Entered By: Merri Ray CMA Duncan Dull) (August 31, 2009 11:24 AM)   Impression & Recommendations:  Problem # 1:  COLONIC POLYPS, HYPERPLASTIC, HX OF (ICD-V12.72) Patient had 2 adenomatous polyps on her last colonoscopy in March 2011. There are no plans for a recall due to her age.  Problem # 2:  Family Hx of COLON CANCER (ICD-153.9) Patient is up to date on her colonoscopy.  Problem # 3:  DIVERTICULOSIS, COLON (ICD-562.10) Patient has irritable bowel syndrome with predominant diarrhea, currently under good control. She  was unable to tolerate Benefiber due to excessive bloating.  I asked her to try it again and  slowly increase the dose from 1/2 teaspoon to 2 teaspoons.  Patient Instructions: 1)  high-fiber diet. 2)  Benefiber, gradually increase the dose from 1/2 teaspoon daily to 2 teaspoons day. 3)  No recall colonoscopy due to age. 4)  Copy sent to : Dr B.Swords 5)  The medication list was reviewed and reconciled.  All changed / newly prescribed medications were explained.  A complete medication list was provided to the patient / caregiver.

## 2010-03-15 NOTE — Progress Notes (Signed)
Summary: Heart pounding  Phone Note Call from Patient Call back at Home Phone [phone]   Caller: Patient Reason for Call: Talk to Nurse Summary of Call: pt states she was in afib yesterday around 3:00. pt wants to know if she needs to be seen today.  Initial call taken by: Edman Circle,  March 05, 2010 8:18 AM  Follow-up for Phone Call        I spoke with the pt and she had a restless night on Saturday and had difficulty sleeping. The pt got up to use the restroom around 3 AM Sunday and then got back in bed.  The pt c/o a pounding heart beat in her chest and temple area.  The pt layed in bed and symptoms resolved. The pt is unsure how long episode lasted and she did not check her vitals during event.  She did check her vitals later and they were 131/61, 60.  The pt denies any further episodes at this time.  She does c/o feeling tired. I will review this information with Dr Riley Kill.  Follow-up by: Julieta Gutting, RN, BSN,  March 05, 2010 11:01 AM  Additional Follow-up for Phone Call Additional follow up Details #1::        Dr Riley Kill aware of pt's symptoms and he said to offer her an appt with the PA if she would like to be seen.  Pt aware and at this time she would like to monitor symptoms and call back if she needs follow-up.  Additional Follow-up by: Julieta Gutting, RN, BSN,  March 05, 2010 7:25 PM

## 2010-03-15 NOTE — Progress Notes (Signed)
Summary: zolpidem  Phone Note Call from Patient Call back at Home Phone [phone]   Caller: Patient Call For: Birdie Sons MD Summary of Call: pt needs new rx fax to [facility] [phone] generic ambien 10 mg. Initial call taken by: Heron Sabins,  August 03, 2009 9:35 AM  Follow-up for Phone Call        Done.  Patient notified.  Follow-up by: Gladis Riffle, RN,  August 03, 2009 10:31 AM    Prescriptions: ZOLPIDEM TARTRATE 10 MG TABS (ZOLPIDEM TARTRATE) Take 1 tablet by mouth at bedtime  #90 x 1   Entered by:   Gladis Riffle, RN   Authorized by:   Birdie Sons MD   Signed by:   Gladis Riffle, RN on 08/03/2009   Method used:   Printed then faxed to ...       [facility] (mail-order)             , [place]         Ph: [phone]       Fax: [phone]   RxID:   [id number] ZOLPIDEM TARTRATE 10 MG TABS (ZOLPIDEM TARTRATE) Take 1 tablet by mouth at bedtime  #90 x 1   Entered by:   Gladis Riffle, RN   Authorized by:   Debbra Riding   Signed by:   Gladis Riffle, RN on 08/03/2009   Method used:   Printed then faxed to ...       [facility] (mail-order)             , [place]         Ph: [phone]       Fax: [phone]   RxID:   [id number]  Rx redone as wrong provider on first printing.

## 2010-03-15 NOTE — Progress Notes (Signed)
Summary: Medication  Phone Note Call from Patient Call back at Home Phone [phone]   Caller: Patient Call For: Willette Cluster Reason for Call: Refill Medication Details for Reason: Medication Summary of Call: Pt. saw Gunnar Fusi yesterday who sent in a prescription to [facility]. They had trouble with their phones and did not ever receive it. Pt. requesting that it be resent ASAP as she needs to pick it up today as she is going out of town. Thank you. Initial call taken by: Schuyler Amor,  December 27, 2009 2:06 PM  Follow-up for Phone Call        Gunnar Fusi called the pharmacy and called the prescription for the Hydrocortisone supp.  [facility] had a problem with their phones yesterday.  I called the pt to inform her and she thanked [place] for taking care of this. Follow-up by: Joselyn Glassman,  December 27, 2009 3:28 PM

## 2010-03-15 NOTE — Progress Notes (Signed)
Summary: Bleeding hemorroid  Phone Note Call from Patient Call back at Home Phone [phone]   Call For: Dr [name] Summary of Call: Has a bleeding hemorroid she thinks- after a normal bm. Can she be seen today? Has plans to go out of town on friday and wants to be well. Initial call taken by: Leanor Kail [facility],  December 25, 2009 9:33 AM  Follow-up for Phone Call        Patient reports bright, red bleeding from rectum after a BM last night. Bright, red blood in water also. States that she had a BM this AM with small amount of bright, red blood. Hx of hemorrhoids but has not had any problems recently. Denies abdominal pain. Scheduled to see Willette Cluster, RNP 12/26/09 at 10:30 AM. Follow-up by: Jesse Fall RN,  December 25, 2009 9:59 AM  Additional Follow-up for Phone Call Additional follow up Details #1::        OK Additional Follow-up by: Hart Carwin MD,  December 25, 2009 11:14 PM

## 2010-03-15 NOTE — Assessment & Plan Note (Signed)
Summary: ACUTE/PT OF DR SWORDS REQ TO BE SEEN TODAY FROM DR STUCKEY'S ...   Vital Signs:  Patient profile:   77 year old female Weight:      134 pounds Temp:     98.4 degrees F oral BP sitting:   130 / 68  (left arm) Cuff size:   regular  Vitals Entered By: Sid Falcon LPN (November 30, 2009 4:21 PM)  History of Present Illness: Patient seen as a work in with nonspecific complaints of not feeling well. She's had some bodyaches particularly the past few days involving upper and lower extremities as well as trunk.  No specific arthralgias.  Patient relates a history of increased cough past several days.  On Saturday went to urgent care and was told she had bronchitis and started on Zithromax. She is not aware of any definite fever. She has some persistent cough possibly worse past couple days. Saw cardiologist a few days ago labs significant for mildly elevated white blood cell count with normocytic anemia. TSH normal.  Patient denies any urinary symptoms. No significant dyspnea. Had nosebleed couple days ago but none since then.  Allergies: 1)  ! Doxycycline Hyclate (Doxycycline Hyclate) 2)  ! Sulfa 3)  Macrodantin (Nitrofurantoin Macrocrystal) 4)  Septra  Past History:  Past Medical History: Last updated: 04/07/2009 Current Problems:  CAD, NATIVE VESSEL (ICD-414.01) HYPERTENSION (ICD-401.9) HYPERLIPIDEMIA (ICD-272.4) TIA (ICD-435.9) GERD (ICD-530.81) DIABETES MELLITUS, TYPE II (ICD-250.00) INTERNAL HEMORRHOIDS (ICD-455.0) COLONIC POLYPS, HYPERPLASTIC, HX OF (ICD-V12.72) Family Hx of COLON CANCER (ICD-153.9) FOOT PAIN (ICD-729.5) LEFT BUNDLE BRANCH BLOCK (ICD-426.3) OSTEOPOROSIS (ICD-733.00) OSTEOARTHRITIS (ICD-715.90) DIVERTICULOSIS, COLON (ICD-562.10)  Past Surgical History: Last updated: 03/01/2009 Tonsillectomy colon   2001 Cataract Extraction Right 5th metatarsal repair  Family History: Last updated: 04/07/2009 mother MI 11 father 93-CHF sister with  breast CA Family History of Colon Cancer: Brother Family History of Heart Disease: Brother, Mother Family History of Uterine Cancer: Sister  Social History: Last updated: 04/07/2009 lives alone 3 kids-all healthy Never Smoked Regular exercise-no Alcohol Use - yes-occasional wine  Risk Factors: Exercise: no (09/09/2006)  Risk Factors: Smoking Status: never (09/25/2009) PMH-FH-SH reviewed for relevance  Review of Systems       The patient complains of anorexia and prolonged cough.  The patient denies fever, weight loss, hoarseness, chest pain, syncope, dyspnea on exertion, peripheral edema, headaches, hemoptysis, abdominal pain, incontinence, and suspicious skin lesions.    Physical Exam  General:  Well-developed,well-nourished,in no acute distress; alert,appropriate and cooperative throughout examination  She is coughing some during exam. Eyes:  pupils equal, pupils round, and pupils reactive to light.   Ears:  External ear exam shows no significant lesions or deformities.  Otoscopic examination reveals clear canals, tympanic membranes are intact bilaterally without bulging, retraction, inflammation or discharge. Hearing is grossly normal bilaterally. Mouth:  Oral mucosa and oropharynx without lesions or exudates.  Teeth in good repair. Neck:  No deformities, masses, or tenderness noted. Lungs:  she has some bibasilar crackles and faint wheezes.  No retractions. Heart:  normal rate and regular rhythm.   Extremities:  no edema.   Impression & Recommendations:  Problem # 1:  COUGH (ICD-786.2) abnormal lung exam with bibasilar crackles which may be new.  ?recent bilateral PNA.  Will repeat CBC and add Avelox 400 mg once daily for 7 days with samples given and she is to f/u with primary tomorrow.  consider repeat CXR if no improving. Orders: TLB-CBC Platelet - w/Differential (85025-CBCD) Venipuncture (16109) Specimen Handling (60454)  Problem #  2:  FATIGUE / MALAISE  (ICD-780.79) ? sec to above.  Problem # 3:  ANEMIA, NORMOCYTIC (ICD-285.9) Assessment: New to follow up with primary tomorrow.  Hgb 11.8 7/10.  Complete Medication List: 1)  Zolpidem Tartrate 10 Mg Tabs (Zolpidem tartrate) .... Take 1 tablet by mouth at bedtime 2)  Simvastatin 40 Mg Tabs (Simvastatin) .... Take one tablet at bedtime 3)  Metoprolol Tartrate 25 Mg Tabs (Metoprolol tartrate) .... 1/2  tablet by mouth twice a day 4)  Plavix 75 Mg Tabs (Clopidogrel bisulfate) .... Take 1 tablet by mouth once a day 5)  Pantoprazole Sodium 40 Mg Tbec (Pantoprazole sodium) .... Take 1/2 tablet daily 6)  Aspirin 81 Mg Tbec (Aspirin) .... Once daily 7)  Ocuvite Tabs (Multiple vitamins-minerals) .... Take 1 tablet by mouth once a day 8)  Zithromax 1 Gm Pack (Azithromycin) .... As directed 9)  Lortab 7.5-500 Mg Tabs (Hydrocodone-acetaminophen) .... As needed 10)  Delsym 30 Mg/51ml Lqcr (Dextromethorphan polistirex) .... As needed  Patient Instructions: 1)  Keep follow up with Dr Cato Mulligan tomorrow. 2)  Start Avelox 400 mg one daily.   Orders Added: 1)  TLB-CBC Platelet - w/Differential [85025-CBCD] 2)  Venipuncture [04540] 3)  Specimen Handling [99000] 4)  Est. Patient Level IV [98119]

## 2010-03-15 NOTE — Procedures (Signed)
Summary: Colonoscopy  Patient: [name] Note: All result statuses are Final unless otherwise noted.  Tests: (1) Colonoscopy (COL)   COL Colonoscopy           DONE     [facility]     [address]     [place], [place]  [address]           COLONOSCOPY PROCEDURE REPORT           PATIENT:  [name], [name]  MR#:  [id number]     BIRTHDATE:  1920-02-24, 88 yrs. old  GENDER:  female     ENDOSCOPIST:  Hedwig Morton. [name], MD     REF. BY:     PROCEDURE DATE:  05/05/2009     PROCEDURE:  Colonoscopy 82956     ASA CLASS:  Class II     INDICATIONS:  colon cancer in brother     hyperplastic polyp in,     prior colon 1996,2001 2006     MEDICATIONS:   Versed 5 mg, Fentanyl 50 mcg           DESCRIPTION OF PROCEDURE:   After the risks benefits and     alternatives of the procedure were thoroughly explained, informed     consent was obtained.  Digital rectal exam was performed and     revealed no rectal masses.   The LB [id number]-H180AL [id number] endoscope     was introduced through the anus and advanced to the cecum, which     was identified by both the appendix and ileocecal valve, without     limitations.  The quality of the prep was good, using MiraLax.     The instrument was then slowly withdrawn as the colon was fully     examined.     <<PROCEDUREIMAGES>>           FINDINGS:  Two polyps were found. 3mm polyp right colon, sessile     polyp cecum, removed with cold snare, bleeding stopped with a clip     Polyp was snared without cautery. Retrieval was successful. snare     polyp The polyp was removed using cold biopsy forceps (see image8,     image7, image5, image4, image3, and image2).  Mild diverticulosis     was found in the sigmoid colon (see image1 and image9).  This was     otherwise a normal examination of the colon (see image10, image4,     and image6).   Retroflexed views in the rectum revealed no     abnormalities.    The scope was then withdrawn from the patient     and the  procedure completed.           COMPLICATIONS:  None     ENDOSCOPIC IMPRESSION:     1) Two polyps     2) Mild diverticulosis in the sigmoid colon     3) Otherwise normal examination     4) Normal colonoscopy     s/p endo clip applicatio to a cecal postpolypectomy site     RECOMMENDATIONS:     1) Await pathology results     hold Plavix x 5 days, then restart     REPEAT EXAM:  In 0 year(s) for.           ______________________________     Hedwig Morton. [name], MD           CC:  n.     eSIGNED:   Hedwig Morton. Madasyn Heath at 05/05/2009 08:46 AM           Page 2 of 3   Avani, [place], [id number]  Note: An exclamation mark (!) indicates a result that was not dispersed into the flowsheet. Document Creation Date: 05/05/2009 8:47 AM _______________________________________________________________________  (1) Order result status: Final Collection or observation date-time: 05/05/2009 08:33 Requested date-time:  Receipt date-time:  Reported date-time:  Referring Physician:   Ordering Physician: [name] [phone]) Specimen Source:  Source: Launa Grill Order Number: [id number] Lab site:

## 2010-03-15 NOTE — Miscellaneous (Signed)
Summary: Initial Summary for PT Services/[facility]  Initial Summary for PT Services/[facility]   Imported By: Maryln Gottron 02/21/2010 15:29:25  _____________________________________________________________________  External Attachment:    Type:   Image     Comment:   External Document

## 2010-05-08 ENCOUNTER — Telehealth: Payer: Self-pay | Admitting: *Deleted

## 2010-05-08 NOTE — Telephone Encounter (Signed)
Please advise on labs, and I cannot find the bone density

## 2010-05-08 NOTE — Telephone Encounter (Signed)
I would recommend labs at that visit so we an make sure to get everything we need based on assessment. DEXA is in chart-look under Chart Review>Imaging.  Showed osteopenia, no osteoporosis.

## 2010-05-08 NOTE — Telephone Encounter (Signed)
Appt. In April and needs to know if she can have labs, and needs bone density results.

## 2010-05-08 NOTE — Telephone Encounter (Signed)
Pt informed

## 2010-06-01 ENCOUNTER — Encounter: Payer: Self-pay | Admitting: Family Medicine

## 2010-06-05 ENCOUNTER — Ambulatory Visit: Payer: Self-pay | Admitting: Family Medicine

## 2010-06-06 ENCOUNTER — Ambulatory Visit (INDEPENDENT_AMBULATORY_CARE_PROVIDER_SITE_OTHER): Payer: 59 | Admitting: Family Medicine

## 2010-06-06 ENCOUNTER — Other Ambulatory Visit: Payer: Self-pay | Admitting: Nurse Practitioner

## 2010-06-06 ENCOUNTER — Encounter: Payer: Self-pay | Admitting: Family Medicine

## 2010-06-06 DIAGNOSIS — I10 Essential (primary) hypertension: Secondary | ICD-10-CM

## 2010-06-06 DIAGNOSIS — D649 Anemia, unspecified: Secondary | ICD-10-CM

## 2010-06-06 DIAGNOSIS — E119 Type 2 diabetes mellitus without complications: Secondary | ICD-10-CM

## 2010-06-06 DIAGNOSIS — I2510 Atherosclerotic heart disease of native coronary artery without angina pectoris: Secondary | ICD-10-CM

## 2010-06-06 LAB — BASIC METABOLIC PANEL
CO2: 28 mEq/L (ref 19–32)
Chloride: 105 mEq/L (ref 96–112)
Creatinine, Ser: 1 mg/dL (ref 0.4–1.2)
Glucose, Bld: 124 mg/dL — ABNORMAL HIGH (ref 70–99)

## 2010-06-06 LAB — CBC WITH DIFFERENTIAL/PLATELET
Basophils Relative: 0.4 % (ref 0.0–3.0)
Eosinophils Relative: 4.4 % (ref 0.0–5.0)
HCT: 35.7 % — ABNORMAL LOW (ref 36.0–46.0)
Lymphs Abs: 2.2 10*3/uL (ref 0.7–4.0)
MCHC: 34 g/dL (ref 30.0–36.0)
MCV: 99.8 fl (ref 78.0–100.0)
Monocytes Absolute: 0.5 10*3/uL (ref 0.1–1.0)
Platelets: 217 10*3/uL (ref 150.0–400.0)
WBC: 5.6 10*3/uL (ref 4.5–10.5)

## 2010-06-06 LAB — LIPID PANEL
LDL Cholesterol: 66 mg/dL (ref 0–99)
Total CHOL/HDL Ratio: 3
Triglycerides: 122 mg/dL (ref 0.0–149.0)

## 2010-06-06 LAB — HEPATIC FUNCTION PANEL
Albumin: 4 g/dL (ref 3.5–5.2)
Alkaline Phosphatase: 31 U/L — ABNORMAL LOW (ref 39–117)
Bilirubin, Direct: 0.1 mg/dL (ref 0.0–0.3)
Total Bilirubin: 0.8 mg/dL (ref 0.3–1.2)

## 2010-06-06 MED ORDER — CLOPIDOGREL BISULFATE 75 MG PO TABS: 75.0000 mg | ORAL_TABLET | Freq: Every day | ORAL | Status: DC

## 2010-06-06 MED ORDER — SIMVASTATIN 40 MG PO TABS: 40.0000 mg | ORAL_TABLET | Freq: Every day | ORAL | Status: DC

## 2010-06-06 MED ORDER — HYDROCORTISONE ACE-PRAMOXINE 2.5-1 % RE CREA: TOPICAL_CREAM | Freq: Two times a day (BID) | RECTAL | Status: DC

## 2010-06-06 NOTE — Progress Notes (Signed)
  Subjective:    Patient ID: [name], female    DOB: [birth year], 77 y.o.   MRN: [id number]  HPI Patient seen for medical followup. Palpitations last visit have fully resolved. Medications reviewed. She needs refills of Plavix and simvastatin. No myalgias. She has history of CAD. No recent chest pain. History of normocytic anemia with hemoglobin stable 11.8 last visit. She denies any dizziness or orthostatic symptoms.  Compliant with all meds. Type 2 diabetes which has been diet controlled.  No symptoms of hyperglycemia.   Review of Systems  Constitutional: Negative for fever, appetite change, fatigue and unexpected weight change.  Respiratory: Negative for cough and shortness of breath.   Cardiovascular: Negative for chest pain, palpitations and leg swelling.  Gastrointestinal: Negative for abdominal pain and blood in stool.  Genitourinary: Negative for dysuria and frequency.  Neurological: Negative for dizziness, seizures and syncope.  Hematological: Negative for adenopathy. Does not bruise/bleed easily.  Psychiatric/Behavioral: Negative for dysphoric mood.       Objective:   Physical Exam  Constitutional: She is oriented to person, place, and time. She appears well-developed and well-nourished. No distress.  HENT:  Mouth/Throat: Oropharynx is clear and moist. No oropharyngeal exudate.  Cardiovascular: Normal rate, regular rhythm and normal heart sounds.  Exam reveals no gallop.   Pulmonary/Chest: Effort normal and breath sounds normal. No respiratory distress. She has no wheezes. She has no rales.  Musculoskeletal: She exhibits no edema.  Neurological: She is alert and oriented to person, place, and time.          Assessment & Plan:  #1 hypertension stable continue Lopressor #2 history of CAD. No recent chest pains. #3 hyperlipidemia. Recheck lipids and hepatic panel. Refill Zocor for one year #4 history of normocytic anemia recheck CBC

## 2010-06-06 NOTE — Telephone Encounter (Signed)
[facility]  [facility].  I redid the Anucort HC MG suppository.

## 2010-06-06 NOTE — Progress Notes (Signed)
Quick Note:  No answer, no machine ______

## 2010-06-07 ENCOUNTER — Telehealth: Payer: Self-pay | Admitting: Family Medicine

## 2010-06-07 DIAGNOSIS — I2510 Atherosclerotic heart disease of native coronary artery without angina pectoris: Secondary | ICD-10-CM

## 2010-06-07 MED ORDER — SIMVASTATIN 40 MG PO TABS: 40.0000 mg | ORAL_TABLET | Freq: Every day | ORAL | Status: DC

## 2010-06-07 NOTE — Telephone Encounter (Signed)
Pt is req a 7 day supply of Simvastatin 40 mg to [facility] on [address], until order arrives from [facility].

## 2010-06-07 NOTE — Telephone Encounter (Signed)
OK to fill for 7 days

## 2010-06-14 ENCOUNTER — Encounter: Payer: Self-pay | Admitting: Cardiology

## 2010-06-14 ENCOUNTER — Ambulatory Visit (INDEPENDENT_AMBULATORY_CARE_PROVIDER_SITE_OTHER): Payer: Medicare Other | Admitting: Cardiology

## 2010-06-14 VITALS — BP 150/62 | HR 56 | Resp 18 | Ht 66.0 in | Wt 129.0 lb

## 2010-06-14 DIAGNOSIS — R002 Palpitations: Secondary | ICD-10-CM

## 2010-06-14 DIAGNOSIS — K648 Other hemorrhoids: Secondary | ICD-10-CM

## 2010-06-14 DIAGNOSIS — I2510 Atherosclerotic heart disease of native coronary artery without angina pectoris: Secondary | ICD-10-CM

## 2010-06-14 NOTE — Progress Notes (Signed)
HPI:  [name] had a bad episode of pulsations in the head last night, similar to January.  Did not feel the same as before.  Now feels better.  This kept her up all night.  Denies chest pain.  Does not want to go to [facility] for granddaughters event.    Current Outpatient Prescriptions  Medication Sig Dispense Refill  . ANUCORT-HC 25 MG suppository INSERT ONE SUPPOSITORY RECTALLY AT BEDTIME FOR 7 DAYS  10 each  1  . aspirin 81 MG tablet Take 81 mg by mouth daily.        . clopidogrel (PLAVIX) 75 MG tablet Take 1 tablet (75 mg total) by mouth daily.  90 tablet  3  . hydrocortisone (PREPARATION H HYDROCORTISONE) 1 % cream Apply topically 2 (two) times daily. As needed       . hydrocortisone-pramoxine (ANALPRAM-HC) 2.5-1 % rectal cream Place rectally 2 (two) times daily. As needed  30 g  1  . metoprolol tartrate (LOPRESSOR) 25 MG tablet Take 25 mg by mouth. 1/2 tab by mouth twice daily       . Multiple Vitamins-Minerals (OCUVITE ADULT 50+ PO) Take by mouth daily.        . pantoprazole (PROTONIX) 40 MG tablet Take 40 mg by mouth. 1/2 tab daily       . simvastatin (ZOCOR) 40 MG tablet Take 1 tablet (40 mg total) by mouth at bedtime.  7 tablet  0  . zolpidem (AMBIEN) 10 MG tablet Take 10 mg by mouth at bedtime as needed.          Allergies  Allergen Reactions  . Doxycycline Hyclate     REACTION: upset stomach  . Nitrofurantoin     REACTION: unspecified  . Sulfamethoxazole W/Trimethoprim     REACTION: unspecified  . Sulfonamide Derivatives     REACTION: rash    Past Medical History  Diagnosis Date  . COLONIC POLYPS, ADENOMATOUS 12/26/2009  . DIABETES MELLITUS, TYPE II 08/11/2006  . HYPERLIPIDEMIA 09/09/2006  . ANEMIA, NORMOCYTIC 11/30/2009  . HYPERTENSION 09/09/2006  . CAD, NATIVE VESSEL 04/27/2008  . LEFT BUNDLE BRANCH BLOCK 08/11/2006  . Atrial flutter 04/12/2009  . TIA 09/02/2006  . HEMORRHOIDS-INTERNAL 12/26/2009  . GERD 02/16/2007  . DIVERTICULOSIS, COLON 08/11/2006  .  OSTEOARTHRITIS 08/11/2006  . OSTEOPOROSIS 08/11/2006    Past Surgical History  Procedure Date  . Tonsillectomy   . Colon surgery 2001  . Eye surgery     catarac  . Closed reduction metatarsal fracture     right 5th    Family History  Problem Relation Age of Onset  . Heart disease Mother 62    MI  . Heart disease Father 71    CHF  . Cancer Sister     breast, uterine  . Cancer Brother     colon  . Heart disease Brother     History   Social History  . Marital Status: Widowed    Spouse Name: N/A    Number of Children: N/A  . Years of Education: N/A   Occupational History  . Not on file.   Social History Main Topics  . Smoking status: Never Smoker   . Smokeless tobacco: Not on file  . Alcohol Use: Not on file  . Drug Use: Not on file  . Sexually Active: Not on file   Other Topics Concern  . Not on file   Social History Narrative  . No narrative on file    ROS: Please  see the HPI.  All other systems reviewed and negative.  PHYSICAL EXAM:  BP 150/62  Pulse 56  Resp 18  Ht 5\' 6"  (1.676 m)  Wt 129 lb (58.514 kg)  BMI 20.82 kg/m2  General: Well developed, well nourished, in no acute distress. Head:  Normocephalic and atraumatic. Neck: no JVD Lungs: Clear to auscultation and percussion. Heart: Normal S1 and S2.  No murmur, rubs or gallops.  Pulses: Pulses normal in all 4 extremities. Extremities: No clubbing or cyanosis. No edema. Neurologic: Alert and oriented x 3.  EKG:  NSR.  LBBB.    ASSESSMENT AND PLAN:

## 2010-06-14 NOTE — Assessment & Plan Note (Signed)
Has limited use of anticoagulation.

## 2010-06-14 NOTE — Assessment & Plan Note (Signed)
No recurrent chest pain. 

## 2010-06-14 NOTE — Assessment & Plan Note (Signed)
coulod be related to atrial arrythmias, but need strip.  Anticoagulation issues reviewed again with patient.

## 2010-06-14 NOTE — Patient Instructions (Signed)
Your physician recommends that you schedule a follow-up appointment in: 2 MONTHS  Your physician recommends that you continue on your current medications as directed. Please refer to the Current Medication list given to you today.   

## 2010-06-26 NOTE — Assessment & Plan Note (Signed)
[name]                            CARDIOLOGY OFFICE NOTE   NAME:[name], [name]                       MRN:          [id number]  DATE:03/03/2007                            DOB:          [birth year]    [name] is in for follow-up.  She has said she is feeling the best  that she has felt in quite some time.  She denies any chest pain or any  significant shortness of breath.  She has not had any symptoms to  suggest transient ischemic attack, and her cardiac rhythm has been  regular.  She overall feels well.   MEDICATIONS:  1. Ambien nightly.  2. Fosamax 70 mg weekly.  3. Lopressor 25 mg p.o. b.i.d.  4. Plavix 75 mg daily.  5. Multivitamin daily.  6. Glucosamine daily.  7. Vitamin C daily.  8. Vitamin E daily.  9. Vitamin D daily.  10.Eye caps daily.  11.Prilosec or omeprazole 20 mg one p.o. daily.  12.Fish oil 500 mg daily.  13.Aspirin 81 mg daily.  14.Simvastatin 40 mg p.o. nightly.   PHYSICAL EXAMINATION:  She is alert and oriented.  Weight is 136 pounds,  blood pressure 110/50 and the pulse is 66.  The precordium is quiet with  a soft 1/6 systolic ejection murmur noted at the left sternal edge.  There are not substantial carotid bruits despite her known  cerebrovascular disease.  The lung fields are clear to auscultation and  percussion.   Electrocardiogram demonstrates normal sinus rhythm with left bundle  branch block.   IMPRESSION:  1. Known coronary artery disease as described in catheterization August 06, 2006.  2. History of cerebrovascular disease.  3. History of probable transient ischemic attack.  4. Hypercholesterolemia on lipid lowering therapy.  5. History of hemorrhoids with hemorrhoidal bleeding.   RECOMMENDATIONS:  1. Continue current medical regimen.  2. With regard to the patient's hemorrhoids, she says that she has not      had much in the way of bleeding.  This came into consideration when  Coumadin was being considered.  3. In the absence of recurrent symptoms, she will continue on her      current medical regimen.  4. She has had her lab work done at Dr. Birdie [name] office and he      will be managing this.     [name]. Desiree Kill, MD, [facility]  Electronically Signed    TDS/MedQ  DD: 03/03/2007  DT: 03/03/2007  Job #: [id number]   cc:   [name]. Swords, MD

## 2010-06-26 NOTE — Assessment & Plan Note (Signed)
[facility]                            CARDIOLOGY OFFICE NOTE   NAME:[name], [name]                       MRN:          [id number]  DATE:11/06/2006                            DOB:          1920/09/09    [name] is in for followup.  She says she is tired but otherwise  has had no episodes of recurrent symptoms.  As mentioned on my previous  note, the patient did have a Holter monitor.  The monitor did  demonstrate some short, brief runs of what looked like possibly atrial  flutter, but nothing was sustained in more than 10 or 12 beats.  Her  echocardiogram prior to that was relatively  unremarkable.  The patient  has had an MRA, an MRI.  The MR study showed no significant stenosis in  the medium to large size intracranial vessels.  The MRI showed a mild  degree of age-appropriate generalized cerebral atrophy and small vessel  change.  She is scheduled to see Dr. Pearlean Brownie next week.  She has not had  any runaway heart or prolonged episode of what sounds like an  arrhythmia, although one cannot exclude some underlying atrial fib.  She  did see Dr. Cato Mulligan in the interim, and they discussed the possibility of  Coumadin anticoagulation, but she has had some problems in the past with  hemorrhoidal bleeding.   CURRENT MEDICATIONS:  1. Fosamax 70 mg weekly.  2. Ambien nightly.  3. Prevacid 30 mg daily.  4. Lopressor 25 mg b.i.d.  5. Plavix 75 mg daily.  6. Lipitor 10 mg daily.  7. Multivitamin.  8. Glucosamine.  9. Vitamin C.  10.Vitamin E.  11.Vitamin D.  12.ICaps.   PHYSICAL EXAMINATION:  She is alert and really looks great.  Blood pressure is 130/57.  Pulse is 54.  Weight is 130 pounds.  There are no carotid bruits noted.  Lung fields are clear to auscultation and percussion.  Cardiac rhythm is regular with a minimal systolic ejection murmur at  paradoxically split S2.  Extremities reveal no edema.   EKG reveals a normal sinus rhythm with  left bundle branch block.   IMPRESSION:  1. Underlying coronary artery disease.  2. Possible brief runs of atrial flutter or supraventricular      tachycardia.  3. Prior several transient ischemic attacks.   PLAN:  Patient is scheduled to see Dr. Pearlean Brownie next week.  She has spoken  with Dr. Cato Mulligan about the possibility of Coumadin anticoagulation.  We  will wait to see what Dr. Pearlean Brownie recommends under these circumstances.     Arturo Morton. Riley Kill, MD, [facility]  Electronically Signed    TDS/MedQ  DD: 11/06/2006  DT: 11/07/2006  Job #: [id number]

## 2010-06-26 NOTE — Discharge Summary (Signed)
[name], [name] NO.:  [id number]   MEDICAL RECORD NO.:  [id number]          PATIENT TYPE:  INP   LOCATION:  3705                         FACILITY:  [facility]   PHYSICIAN:  Valerie A. Felicity Coyer, MDDATE OF BIRTH:  02-13-1920   DATE OF ADMISSION:  08/05/2006  DATE OF DISCHARGE:  08/06/2006                               DISCHARGE SUMMARY   DISCHARGE DIAGNOSES:  1. Dyspnea on exertion, increasing discomfort concerning for unstable      angina, status post cardiac catheterization, see below.  2. Diffuse coronary disease by coronary catheterization June 25.      Continue medical management. Preserved left ventricular function      with normal ejection fraction.  3. Type 2 diabetes diet controlled.  4. Dyslipidemia.  Normal fasting lipid profile April 2008.  5. Chronic left bundle branch block.  6. Hypertension.  Continue low-dose beta blocker.  7. Osteoarthritis.  8. Gastroesophageal reflux disease, continue Proton pump inhibitor.   DISCHARGE MEDICATIONS:  1. Aspirin 325 mg daily (in place of previous 81 mg daily).  2. Lopressor 25 mg p.o. b.i.d. new prescription provided, dispense 60      with one refill.  3. IMDUR 30 mg one p.o. daily dispense 30 with one refill.  Other medications are as prior to admission include:  1. Prevacid 30 mg once daily.  2. Lipitor 10 mg p.o. q.h.s.  3. Fosamax 70 mg once weekly.   DISPOSITION:  The patient is discharged home in medically stable  improved condition.  She feels well and will contact a primary care  physician, Dr. Birdie Sons for follow-up per routine in next 2 to 3  weeks. Also follow up to be arranged prior to discharge with  cardiologist Dr. Antoine Poche or his team regarding post cath follow-up and  continued medical management.   HOSPITAL COURSE:  Problem 1.  Dyspnea on exertion rule out unstable  angina.  The patient is a pleasant 77 year old woman with multiple risk  factors for coronary disease in to see  primary care physician for  increasing dyspnea on exertion associated with chest heaviness. She has  not experienced these symptoms at rest or while sleeping. Because of  concerning history as well as multiple risk factors for coronary disease  and her age she was admitted for further cardiac evaluation to rule out  unstable angina.  Cardiac enzymes were negative.  The patient did have a  chronic left bundle branch block and was deemed appropriate for further  risk stratification with coronary catheterization. This was performed by  Dr. Riley Kill on June 25 which showed diffuse coronary disease with heavy  calcification of coronary arteries but no critical lesion and  recommended only medical management.  This will be done with aspirin  therapy, beta blocker, low-dose nitrates and statin therapy as well as  other risk modifications of her other chronic health issues as listed  above.  The patient has remained asymptomatic during this  hospitalization is anxious for discharge home pending clearance by  cardiology. Post catheterization she will be discharged home with follow-  up with cardiology and primary  care as described above.   Problem 2.  Other chronic medical issues.  The patient will continue her  other medications for GERD, dyslipidemia, and continue diet management  of her diabetes. A hemoglobin A1c was relatively normal at 6.7 in April  of this year and continued outpatient follow-up on her diabetes and  other risk factors will be per primary M.D.      Valerie A. Felicity Coyer, MD  Electronically Signed     VAL/MEDQ  D:  08/06/2006  T:  08/07/2006  Job:  [id number]

## 2010-06-26 NOTE — Letter (Signed)
December 12, 2006    Bruce H. Swords, MD  [address] [place] [address]   RE:  [place], Zarzycki [facility]  MRN:  [id number]  /  DOB:  1920-07-02   Dear Smitty Cords:   I had the pleasure of seeing this patient in the office today on a  follow-up visit.  To briefly summarize, in the last several months she  has had no further episodes of what previously sounded like TIA's.  As  you know, there were no funduscopic findings suggestive of  thromboembolic disease.  Moreover, her Holter monitor did show a couple  of brief runs of supraventricular tachycardia, but she has had  absolutely no documented atrial fibrillation in the past.  She has had  no recurrent symptoms since the last episode that we knew about in July.  She is now on Plavix.  She has also been seen by Pramod P. Pearlean Brownie, M.D.  and he has recommended continued Plavix.   CURRENT MEDICATIONS:  1. Lopressor 25 mg b.i.d.  2. Plavix 75 mg daily.  3. Lipitor 10 mg daily.  4. Multivitamin daily.  5. Glucosamine daily.  6. Prilosec 20 mg daily.  7. Fish Oil daily.  8. ICaps one daily.  9. Vitamin D 400 International Units daily.  10.Vitamin E 400 International Units daily.  11.Vitamin C 500 mg daily.   PHYSICAL EXAMINATION:  VITAL SIGNS:  Blood pressure 120/60, pulse 62,  carotid upstrokes are brisk.  LUNGS:  Fields are clear.  There is no definite significant murmur  noted.   To summarize, this nice lady has had what sounded like TIA's.  She has  been evaluated by neurology.  She does not have definite evidence of  atrial fibrillation, and no symptomatic history to suggest such.  She  also has the potential for vascular events, and I agree with the  recommendation for continued Plavix.  I have asked her to resume or  start taking a baby aspirin daily with her regimen.  I have also  suggested that we do a lipid profile and try to aim for an LDL goal of  less than 70.  Should she have any problems in the interim, please do  not  hesitate to let me know.  Finally, she has had problems with  hemorrhoidal bleeding in the past and although not severe, I suspect  this would be problematic with Coumadin.  Based on her current symptoms,  I am not sure adding Coumadin makes much sense.  If she has further  events, she is to contact us promptly.  Thanks for allowing [place] to share  in her care.    Sincerely,      Arturo Morton. Riley Kill, MD, [facility]  Electronically Signed    TDS/MedQ  DD: 12/12/2006  DT: 12/13/2006  Job #: [id number]

## 2010-06-26 NOTE — Assessment & Plan Note (Signed)
[facility]                            CARDIOLOGY OFFICE NOTE   NAME:Albert, Ahnna G                       MRN:          [id number]  DATE:10/21/2006                            DOB:          09-16-1920    Ms. Hampe is in for a followup visit. To briefly summarize, this  patient was admitted in June of this year. She has mild diabetes and  hyperlipidemia. She had some chest discomfort. She was seen by Dr.  Gala Romney and subsequently referred for a cardiac catheterization. At  the time of her catheterization she had an uncomplicated course. The  catheterization revealed overall preserved left ventricular function.  There was diffuse scatter 3 vessel disease, although it was not felt to  be critical and medical therapy was recommended. She was discharged from  the hospital. She subsequently represented, and had an episode of speech  disturbance. She had difficultly finding words and felt thickness in the  tongue, had trouble speaking and this lasted about 10 to 15 minutes. In  retrospect, she had had a previous episode in April where she had  confusion and disorientation while she was in the [employer]. She has  been otherwise independent of activities. She had an MRI of the brain  which showed no acute infarct and she was started on Plavix. She saw Dr.  Cato Mulligan who discontinued her Imdur but started her on metformin. She felt  lousy and stopped this. In late July, the patient had an episode of  peripheral visual disturbance in the right eye in which she was unable  to read sentences and words at the end. She could read the left portion  of the word but not the last several letters. This improved and she was  seen by an ophthalmologist who found a retinal detachment but not enough  to explain her symptoms. She then had an episode in August trying to do  a puzzle, she noticed that the left-sided letters were missing and the  vision was no completely  clear. Since that time she has had no recurrent  episodes. She has seen Dr. Pearlean Brownie and a Holter monitor was ordered. The  Holter monitor is reviewed. There are several bursts of supraventricular  tachycardia, none of which appear to be sustained. It is difficult to  tell on the blown up strips whether this is a standard SVT or could  represent short runs of atrial flutter. Most of the time she is in sinus  rhythm. As noted she has denied further symptoms. She denies chest pain  at the present time.   Her medications include:  1. Fosamax 75 mg daily.  2. Prevacid 30 mg daily.  3. Ambien at bedtime.  4. Lopressor 25 mg b.i.d.  5. Plavix 75 mg daily.  6. Lipitor 10 mg at bedtime.  7. Multivitamin daily.   One notable finding is that of a left carotid stenosis of 40% to 60%.   PHYSICAL EXAMINATION:  She is alert, oriented, and engaged from a  discussion standpoint. Her blood pressure is 138/64, the pulse is 65,  the weight  is 130 pounds.  I do not appreciate a definite bruits.  LUNG FIELDS: Clear to auscultation and percussion.  CARDIAC: Rhythm is regular without a definite irregularity.   Previous EKGs have revealed sinus rhythm with underlying  intraventricular conduction delay compatible with left bundle branch  block.   IMPRESSION:  1. Moderate coronary artery disease.  2. Advanced age.  3. Recurrent episodes of intermittent neurologic dysfunction of      uncertain etiology. The patient has known left carotid stenosis,      has had bilateral symptomatology, negative MRIs, and Holter monitor      which demonstrates brief runs of supraventricular arrhythmia.   I plan to discuss her case with Dr. Birdie Sons who she is planning to  see on Friday. There are important considerations here. The patient has  had several episodes of neurologic dysfunction, and it almost sounds by  history that these have been going back to April. Whether these could  represent transient episodes of  atrial arrhythmia with concomitant  stroke are unclear. Alternatively they could represent an atheroembolic  event. She has had problems with hemorrhoids in the past, even to the  point of where they have been considered to be tied off. She has had  some recurrent bleeding from this and she notes that this has been  clearly the case, whether or not she should be switched from Plavix to  warfarin is unclear to me. I will discuss the case with Dr. Birdie Sons  when he sees her on Friday so that a decision can be made. If so she  would need to be monitored closely for further atheroembolic events. I  will plan to see her back personally in 2 weeks and during that interval  she will remain on  Plavix full time.     Arturo Morton. Riley Kill, MD, [facility]  Electronically Signed    TDS/MedQ  DD: 10/21/2006  DT: 10/22/2006  Job #: [id number]

## 2010-06-26 NOTE — Letter (Signed)
August 25, 2007    Bruce H. Swords, M.D.  [address] [place], [place] [address]   RE:  Desiree, [name] [facility]  MRN:  [id number]  /  DOB:  04/22/1920   Dear [name],   I had the pleasure of seeing [name] in the office today in  followup to briefly summarize, as you know, she has gotten along  reasonably well.  For the past 2 months, she has had some very mild  chest tightness associated predominantly with exertion.  In addition,  back in January she had a brief episode where she got a thick tongue.  She did have carotid Dopplers today, which showed 0-39% bilaterally, and  a followup in 2 years was recommended.  Otherwise, her functional status  is continued to be good.  She currently has some palpitations.   MEDICATIONS:  1. Lopressor 25 mg b.i.d.  2. Fosamax 70 mg weekly.  3. Plavix 75 mg daily.  4. Multivitamin daily.  5. Glucosamine 2 daily.  6. Vitamin C daily.  7. Vitamin E daily.  8. Vitamin D daily.  9. Eye caps daily.  10.Prilosec 20 mg daily.  11.Fish oil 500 mg daily.  12.Aspirin 81 mg daily.  13.Simvastatin 40 mg q.h.s.   PHYSICAL EXAMINATION:  GENERAL:  She is alert and oriented, in no  distress.  VITAL SIGNS:  The blood pressure is 140/60, the pulse is 58, and the  lung fields are clear.  CARDIAC:  The cardiac rhythm is regular.   Her electrocardiogram today demonstrates left bundle branch block and  otherwise is unremarkable.   [name] is stable.  As you know, she has really opposed taking  anything other than aspirin and Plavix, in light of her hemorrhoidal  bleeding and other issues in the past that certainly is worth  considering.  She does have occasional palpitations.  Over the last 2  months she has had some exertional angina.  We talked about the  possibility of adding low-dose nitrates to her regimen, but she wanted  to try to hold off on this and try to continue to exercise.  One final  issue would be that consideration of discontinuation  of her Prilosec.  She takes this for reflux, but as you know, it potentially could  interfere with clopidogrel.   I appreciate the opportunity of sharing in her care and we plan to see  her back in followup in about 2 months' time.  She is going to have labs  in your office next week.  We had a long ranging discussion about her  age, and current treatment plans.    Sincerely,      Arturo Morton. Riley Kill, MD, [facility]  Electronically Signed    TDS/MedQ  DD: 08/25/2007  DT: 08/26/2007  Job #: [id number]

## 2010-06-26 NOTE — Discharge Summary (Signed)
NAMEJoci, [name]                ACCOUNT NO.:  [id number]   MEDICAL RECORD NO.:  [id number]          PATIENT TYPE:  INP   LOCATION:  3733                         FACILITY:  [facility]   PHYSICIAN:  Valerie A. Felicity Coyer, MDDATE OF BIRTH:  02/28/1920   DATE OF ADMISSION:  08/12/2006  DATE OF DISCHARGE:  08/14/2006                               DISCHARGE SUMMARY   DISCHARGE DIAGNOSES:  1. Transient ischemic attack.  2. Hypertension.  Labile blood pressure.  3. Left internal carotid artery stenosis 40 to 60%.   HISTORY OF PRESENT ILLNESS:  Miss [name] is an 77 year old white  female with a past medical history of diabetes type 2, coronary artery  disease, and recent admission for chest pain after cardiac cath, who  presented with a chief complaint of slurred speech.  She noted her  speech lapsed a few minutes on the evening of admission, became  concerned and she called her neighbor.  She was admitted for further  evaluation and treatment.   PAST MEDICAL HISTORY:  1. Coronary artery disease.  Status post recent catheterization with      preserved ejection fraction.  2. Diabetes type 2.  3. Dyslipidemia.  4. Hypertension.  5. Chronic left bundle branch block.  6. Osteoarthritis.  7. GERD.   COURSE OF HOSPITALIZATION:  TIA.  The patient was admitted and underwent  a CT of the head which was negative for any acute abnormalities.  She  was seen in consultation by Dr. Deneen Harts of neurology.  He  recommended changing aspirin to Plavix and continue her other home  medications.  He also recommended checking a MRI, MRA, as well as  carotid duplex.  Carotid duplex was performed on August 13, 2006 which  noted no right ICA stenosis.  However, did note some left ICA stenosis  of 40 to 60%.  MRA was performed which showed a remote lacunar infarct  in the left cerebellum.  It did not note any acute abnormalities.  There  was some mild irregularity in the right internal carotid artery  without  significant stenosis.  Circle of Willis was unremarkable.  2D echo was  not performed during this admission as it has been performed recently on  her previous admission.  Her symptoms resolved.  She is currently at her  neurological baseline.  Continuous risk factor modification including  blood sugar management, her A1c indicates relatively good control with a  value of 6.5.  In addition, her lipids are well-controlled with a total  cholesterol of 143, HDL of 46, and a LDL of 80.  Plan to continue  Lipitor at current dosing.  The patient will also be placed on Plavix  and will need continued outpatient monitoring to evaluate stability of  left ICA stenosis.   MEDICATIONS AT TIME OF DISCHARGE:  1. Lopressor 25mg  p.o. b.i.d.  2. Imdur 30 mg p.o. daily.  3. Prevacid 30 mg p.o. daily.  4. Lipitor 10 mg p.o. daily.  5. Plavix 75 mg p.o. daily.  6. Fosamax 70 mg p.o. once weekly.   LABORATORIES AT TIME OF DISCHARGE:  Hemoglobin  12.4, hematocrit 37.  BUN  26, creatinine 1.1.   DISPOSITION:  The patient will be discharged to home.  Upon family  request, we will ask home health PT to evaluate the patient in the home  for any further skilled needs and home safety assessment.  The patient  is currently ambulating without difficulty in the room.   FOLLOWUP:  The patient is scheduled to follow up with Dr. Birdie Sons  on Monday, August 18, 2006 at 11 O'clock a.m.  At this appointment, she  will need follow up of her blood pressure as she was noted to have  elevated blood pressure on admission.  Blood pressure at time of  discharge is currently 116/56 on her home meds regimen.      Sandford Craze, NP      Raenette Rover. Felicity Coyer, MD  Electronically Signed    MO/MEDQ  D:  08/14/2006  T:  08/15/2006  Job:  [id number]   cc:   Valetta Mole. Swords, MD

## 2010-06-26 NOTE — Assessment & Plan Note (Signed)
[facility]                                 ON-CALL NOTE   NAME:[name], [name]                         MRN:          [id number]  DATE:08/13/2006                            DOB:          [birth year]    CARDIOLOGIST:  Dr. Riley Kill.   PHONE NUMBER:  [phone].   HISTORY:  [name] is an 77 year old female patient, recently seen  in the hospital for unstable angina pectoris.  She underwent cardiac  catheterization that revealed multivessel CAD with moderate diffuse  disease.  She was treated medically.  Tonight she developed elevated  blood pressures with unusual sensation of her tongue.  Her friend,  [name], called the answering service.  She said that the patient was  complaining of her tongue feeling thick, and she had difficulty with  speech, although she was not aphasic.  [name] thinks the right side of  her face seems to be drooping somewhat but it is very subtle.  Her blood  pressure has been as high as 172/128.  The patient denies chest pain,  shortness of breath, or syncope.  She took Lopressor earlier today as  well as aspirin.  Her symptoms all started about 8:30.   PLAN:  I explained to [facility] that Ms. Storbeck should go to the  emergency room as soon as possible.  If her symptoms are indicative of a  stroke, she needs to be evaluated quickly.  I advised Margie after  getting off the phone with me to call 911 to activate EMS.  She should  be seen at [name] for further evaluation by the emergency  room physician, and possibly neurology.   DISPOSITION:  As noted above.  Margie agreed that she would call 911 as  soon as she hung up the phone with me.      Tereso Newcomer, PA-C  Electronically Signed      Noralyn Pick. Eden Emms, MD, [facility]  Electronically Signed   SW/MedQ  DD: 08/12/2006  DT: 08/13/2006  Job #: [id number]

## 2010-06-26 NOTE — Consult Note (Signed)
NAMEYANCI, BACHTELL                ACCOUNT NO.:  [id number]   MEDICAL RECORD NO.:  [id number]          PATIENT TYPE:  INP   LOCATION:  3733                         FACILITY:  [facility]   PHYSICIAN:  Bevelyn Buckles. Champey, M.D.DATE OF BIRTH:  07-11-1920   DATE OF CONSULTATION:  DATE OF DISCHARGE:                                 CONSULTATION   NEUROLOGY CONSULTATION:   REQUESTING PHYSICIAN:  Dr. Rito Ehrlich and Dr. Ignacia Palma.   REASON FOR CONSULT:  Transient ischemic attack.   HPI:  [name] is an 77 year old Caucasian female with a past  medical history of hypertension and high cholesterol.  Presents early  this morning after 2 episodes of speech difficulty.  The patient states  that she felt like her tongue was thick and she had problems getting  words out as she knew what she wanted to say; however, could not express  them.  She had no problems with comprehension.  The symptoms lasted a  few minutes each and then quickly resolved.  She is now back to  baseline.  She also states she had a left-sided earache during symptoms.  She denies any headache, focal weakness, numbness, position changes,  swallowing problems, chewing problems, vertigo, dizziness, falls, or  loss of consciousness.   PAST MEDICAL HISTORY:  Is positive for hypertension,  hypercholesterolemia.   CURRENT MEDICATIONS:  Include Lipitor, Lopressor, aspirin, Imdur,  Prevacid, and Fosamax.   ALLERGIES:  SULFA.   FAMILY HISTORY:  Is positive for heart disease.   SOCIAL HISTORY:  The patient lives alone, occasionally drinks a glass of  wine, denies any smoking.   REVIEW OF SYSTEMS:  Is positive as per HPI.  Review of systems negative  as per HPI in greater than 8 other organ systems.   EXAMINATION:  VITALS:  Temperature is 97.4, blood pressure is 184/72,  respiration is 20, pulse is 74, O2 sat is 100%.  HEENT:  Normocephalic, atraumatic.  Extraocular muscles are intact.  The  face is symmetric.  NECK:  Supple.  No  carotid bruits are heard.  HEART:  Regular.  LUNGS:  Clear.  ABDOMEN:  Soft, nontender.  EXTREMITIES:  Show good pulses.  NEUROLOGICAL EXAMINATION:  The patient is awake, alert, language is  fluent.  Cranial nerves II through XII are grossly intact.  Motor  examination shows good strength in all 4 extremities.  Sensory  examination is within normal limits.  Light touch reflexes are 1+  throughout.  Cerebellar function is within normal limits, finger-to-  nose.  Gait is not assessed secondary to safety.   CT of the head showed diffuse atrophy with no acute findings.   LABS:  WBC 7.6, hemoglobin 12.4, hematocrit is 37.0, platelets 248.  PT  is 13.1, INR is 1.0.  Sodium is 138, potassium is 4.8, chloride is 105,  CO2 is 28, BUN 26, creatinine is 1.1, glucose 134.   IMPRESSION:  This is an 77 year old Caucasian female with transient  speech difficulty and transient ischemic attack.  She is not a candidate  for intravenous tissue plasminogen activator as her symptoms have  completely resolved.  I agree with getting an MRI/MRA of the brain and  carotid Dopplers.  I will also order 2-D echocardiogram to complete the  workup.  I also recommend getting fasting lipids and homocysteine level.  I will change her aspirin to Plavix, and I will continue her other home  medications.   We will follow the patient on the stroke consult service.      Bevelyn Buckles. Nash Shearer, M.D.  Electronically Signed     DRC/MEDQ  D:  08/13/2006  T:  08/13/2006  Job:  [id number]

## 2010-06-26 NOTE — Assessment & Plan Note (Signed)
[facility]                            CARDIOLOGY OFFICE NOTE   NAME:Beggs, Lateefah G                       MRN:          [id number]  DATE:01/28/2008                            DOB:          1921-02-05    Ms. Krogh is in for a followup visit.  In general, she is really been  doing well.  She is getting ready to go to [place].  She does wear a  pad, because she spots on a fairly frequent basis, but not a lot.  She  denies any ongoing chest pain or progressive shortness of breath.  She  did have one episode at night where she felt like her heart was  fluttering.  She went to bed and that went away.  She has not had any  recurrence whatsoever.   Otherwise, her functional status is excellent.  She continues to live  independently without difficulty.   PHYSICAL EXAMINATION:  The blood pressure is 152/70, the pulse is 63.  The lung fields are actually quite clear.  The cardiac rhythm is regular  with paradoxical splitting of the second heart sound.  There is no lower  extremity edema.   The electrocardiogram demonstrates normal sinus rhythm with left bundle  branch block.   IMPRESSION:  1. Coronary artery disease as previously described of catheterization,      August 06, 2006.  2. Palpitations.  3. Hemorrhoids with low-grade bleeding.  4. History of transient ischemic attack of uncertain etiology.  5. Hypercholesterolemia.   PLAN:  1. Continue current medical regimen.  2. We discussed antithrombin therapy, and its implications although      with a low-grade bleeding, she is not thought to be a good Coumadin      candidate.  We will continue to follow her closely.     Arturo Morton. Riley Kill, MD, [facility]  Electronically Signed    TDS/MedQ  DD: 01/28/2008  DT: 01/28/2008  Job #: [id number]   cc:   Valetta Mole. Swords, MD

## 2010-06-26 NOTE — Assessment & Plan Note (Signed)
[facility]                            CARDIOLOGY OFFICE NOTE   NAME:[name], [name]                       MRN:          [id number]  DATE:11/06/2006                            DOB:          05/24/1920    [name] is in for a follow up visit. Since I last saw her, the  patient has had no further episodes of what sounds like TIAs.  Importantly from a historical standpoint prior to admission to the  hospital, she had a episode of what sounds like almost transient global  amnesia while she was going through the [employer]. This eventually  resolved and she was able to drive home. She has then had two episodes  of visual disturbances,    CANCELLED DICTATION     Arturo Morton. Riley Kill, MD, [facility]  Electronically Signed    TDS/MedQ  DD: 11/06/2006  DT: 11/07/2006  Job #: [id number]

## 2010-06-26 NOTE — Assessment & Plan Note (Signed)
[facility]                            CARDIOLOGY OFFICE NOTE   NAME:[name], [name]                       MRN:          [id number]  DATE:10/27/2007                            DOB:          Sep 17, 1920    [name] is in for followup.  She raked leaves yesterday, but she  developed some terrible pain in her right arm this morning, but this has  all gone.  She also has gotten hot and sweaty earlier today, but she  took her sugar and it was substantially higher.  She gets relatively  easily fatigued.  She has sporadic hemorrhoidal bleeding from times and  some seepage, but it has not been frequent and no more than on the past  with the Plavix.   CURRENT MEDICATIONS:  1. Ambien nightly p.r.n.  2. Lopressor 25 mg b.i.d.  3. Plavix 75 mg daily.  4. Multivitamin daily.  5. Glucosamine complex 2 daily.  6. Vitamin C 500 mg daily.  7. Vitamin E 400 International Units daily.  8. Vitamin D 400 daily.  9. ICaps daily.  10.Fish oil 500 mg daily.  11.Aspirin 81 mg daily.  12.Simvastatin 40 mg nightly.  13.Protonix 40 mg daily.  14.Vitamin B12, B6 daily.  15.Cinnamon 500 mg daily.   PHYSICAL EXAMINATION:  GENERAL:  She is alert and oriented in no  distress.  VITAL SIGNS:  Her blood pressure is 128/62 and the pulse is 60.  NECK:  No carotid bruits noted.  LUNGS:  Lung fields are clear.  CARDIAC:  Rhythm is regular.   Overall, [name] has had absolutely no recurrences of her episodes.  She has known internal hemorrhoids, and she has had some bleeding in the  past.  She has not been thought to be a good Coumadin candidate.  I plan  to continue to follow her, and we will see her back in followup in 3  months.  She will continue to follow with Dr. Cato Mulligan as well.     Arturo Morton. Riley Kill, MD, [facility]  Electronically Signed    TDS/MedQ  DD: 10/27/2007  DT: 10/28/2007  Job #: [id number]

## 2010-06-26 NOTE — Cardiovascular Report (Signed)
[name], [name] NO.:  [id number]   MEDICAL RECORD NO.:  [id number]          PATIENT TYPE:  INP   LOCATION:  3705                         FACILITY:  [facility]   PHYSICIAN:  Arturo Morton. Riley Kill, MD, FACCDATE OF BIRTH:  18-Feb-1920   DATE OF PROCEDURE:  08/06/2006  DATE OF DISCHARGE:                            CARDIAC CATHETERIZATION   INDICATIONS:  Ms. Kalama is an 77 year old who has presented with some  discomfort in the chest.  It is worrisome for exertional angina.  A 2-D  echo revealed preserved overall LV function.  The current study was done  to assess coronary anatomy.   PROCEDURE:  1. Left heart catheterization.  2. Selective coronary arteriography.  3. Selective left ventriculography.   DESCRIPTION OF PROCEDURE:  The patient was brought to the  catheterization laboratory.  She had been seen by Dr. Gala Romney and set  up for diagnostic cardiac cath.  Following informed consent, the right  femoral artery was entered using a 5-French sheath.  Views of the left  and right coronary arteries were then obtained in multiple angiographic  projections.  Central aortic and left ventricular pressures were  measured with a pigtail. Ventriculography was performed in the RAO  projection.  We then analyzed the images carefully.  Following this, she  was taken to the holding area in satisfactory clinical condition.  Because of elevated blood pressure, she did receive 20 mg of intravenous  labetalol.  There were no complications.   HEMODYNAMIC DATA:  1. Central aortic pressure of 186/69, mean 113 prior to labetalol.  2. Left ventricular pressure 155/11.  3. No gradient pullback across the aortic valve.   ANGIOGRAPHIC DATA:  1. Ventriculography was done in the RAO projection.  Overall systolic      function was preserved.  Ejection fraction was felt to be in excess      of 55%.  There was 1+ mitral regurgitation.  2. There was heavy calcification of the coronary  arteries.  3. The left main is free of critical disease.  4. The LAD courses to the apex.  There is calcification in the mid      LAD.  At the takeoff of the first diagonal branch, there is      calcification and there is evidence of some mild narrowing of the      diagonal proper.  This would measure about 50% luminal reduction at      the ostium but does not appear to be critical.  Likewise, the LAD      has about 50% narrowing, as well, but again it does not appear      critical.  More distally, there is about 40% narrowing and some      diffuse luminal irregularity at the LAD throughout.  The diagonal      is at least of moderate size.  5. The circumflex is a moderate size vessel.  It is also calcified.      There is a first marginal branch that has segmental plaquing of      about 50-60%, possibly up to 70% which  is quite segmental in the      proximal portion of the vessel.  The lesion is not focal and it is      seen in multiple views.  It certainly does not appear to be      critical in the RAO caudal view.  The remainder of the circumflex      provides a moderate-sized marginal branch and an AV circumflex that      provides a posterolateral branch.  The moderate size marginal      branch probably has about 30% mild plaquing in its proximal      portion.  6. The right coronary artery is also moderately calcified.  There is      segmental plaque noted throughout the whole proximal vessel.  This      measures about 40% luminal reduction.  There is a fairly focal      location of an eccentric plaque in the mid vessel but it does not      appear to exceed 50 to at most 60% luminal reduction and is seen in      multiple views including a left lateral view.  The proximal portion      of the posterior descending branch has an ostial 50-60% stenosis,      as well.  This could be up to 70% and is short and fairly discrete      and does involve the ostium.   CONCLUSIONS:  1.  Well-preserved left ventricular function with perhaps mild mitral      regurgitation.  2. Multivessel coronary artery disease with moderate diffuse three-      vessel disease.   DISPOSITION:  Initially, we would recommend medical therapy.  Control of  blood pressure would be important.  I will discuss her case with Dr.  Gala Romney.  We might be inclined to add low-dose beta blockade.  Nitrates would also be acceptable.      Arturo Morton. Riley Kill, MD, [facility]  Electronically Signed     TDS/MEDQ  D:  08/06/2006  T:  08/06/2006  Job:  [id number]   cc:   Valetta Mole. Swords, MD  Bevelyn Buckles. Bensimhon, MD  [facility] laboratory

## 2010-06-26 NOTE — Consult Note (Signed)
[name], COELLO NO.:  [id number]   MEDICAL RECORD NO.:  [id number]          PATIENT TYPE:  INP   LOCATION:  3705                         FACILITY:  [facility]   PHYSICIAN:  Bevelyn Buckles. Bensimhon, MDDATE OF BIRTH:  26-Dec-1920   DATE OF CONSULTATION:  08/05/2006  DATE OF DISCHARGE:                                 CONSULTATION   REFERRING PHYSICIAN:  Valetta Mole. Swords, M.D.   CARDIOLOGIST:  Rollene Rotunda, MD, [facility].   REASON FOR CONSULTATION:  Unstable angina.   Ms. Yearwood is a delightful 77 year old woman with a history of  diabetes and hyperlipidemia.  She has no known history of coronary  artery disease.  She had a stress test back in 2003 secondary to an  asymptomatic left bundle branch block.  She said this turned out to be  okay.  She is particularly very active, working in her garden with other  chores.  Over the past 2-3 months, she has noticed a history of  exertional chest pain, which she says always is relieved with rest.  Over the past weekend, she was taking the trash out and had an episode  of severe chest pressure.  This lasted a few minutes and then resolved.  There were no associated with symptoms.  She went to see Dr. Cato Mulligan  today and was admitted for unstable angina and possible heart  catheterization.   REVIEW OF SYSTEMS:  She denies any bleeding.  There has been no bright  red blood per rectum.  No melena.  She does have some chest pain and  dyspnea on exertion, as per HPI, as well as a cough.  No heart failure.  No fevers, chills, nausea, vomiting, or abdominal pain.  The remainder  of the review of systems is negative.   PAST MEDICAL HISTORY:  1. Diabetes x35 years, diet-controlled.  2. Hyperlipidemia.  3. Left bundle branch block with a negative stress test in 2003.  4. Osteoarthritis.  5. History of cataract surgery.  6. History of questionable TIA in April, 2008, manifested as sudden      onset of confusion.   CURRENT  MEDICATIONS:  1. Lovenox 40 a day.  2. Lipitor 10 a day.  3. Prevacid 30 a day.  4. Tylenol.  5. Restoril.  6. Phenergan.  7. Nitroglycerin p.r.n.   ALLERGIES:  SULFA.   SOCIAL HISTORY:  She lives in [place].  She is retired and widowed.  She has three children.  Denies any tobacco.  Occasional alcohol use.   FAMILY HISTORY:  Father died in 98.  He had a history of a pacemaker  placement.  He also had a brother who is status post valve replacement  and a sister who died at age 79.  She also had a pacemaker placement.  There is no family history of premature coronary artery disease .   PHYSICAL EXAMINATION:  GENERAL:  She looks great.  She is very vibrant  and looks much younger than stated age.  She is in no apparent distress.  VITAL SIGNS:  Blood pressure is 170/70, heart rate 80.  Temperature  is  97.2.  HEENT:  Normal.  NECK:  Supple.  There is no JVD.  Perhaps very mild bilateral carotid  bruits.  These are hard to hear over the noise in the room.  There is no  lymphadenopathy or thyromegaly.  CARDIAC:  She has a regular rate and rhythm with an S4.  A very soft  systolic ejection murmur at the right sternal border.  S2 is well  preserved.  LUNGS:  Clear.  ABDOMEN:  Soft, nontender, nondistended.  There is no  hepatosplenomegaly.  No bruits.  No masses.  Good bowel sounds.  EXTREMITIES:  Warm with no clubbing, cyanosis or edema.  Distal pulses  are 2+ bilaterally.  NEURO:  She is alert and oriented x3.  Cranial nerves II-XII are intact.  Moves all four extremities without difficulty.  Affect is very pleasant.   EKG shows normal sinus rhythm at a rate of 64 with a left bundle branch  block.   White count is 8.2, hemoglobin 12.5, platelets 212.  Sodium 138,  potassium 4.3, chloride 107, bicarb 27, BUN 18, creatinine 0.9, glucose  125.  CK is 94.  MB is 2.5.  Troponin is less than 0.02.  INR is 1.   ASSESSMENT/PLAN:  Ms. Appleman symptoms are very concerning for   progressive/unstable angina.  She has not had nocturnal or rest  symptoms.  We will plan on cardiac catheterization in the morning to  further evaluate.  We will also get an echocardiogram to evaluate her  aortic valve, although I do not think she has significant stenosis  there.      Bevelyn Buckles. Bensimhon, MD  Electronically Signed     DRB/MEDQ  D:  08/05/2006  T:  08/05/2006  Job:  [id number]

## 2010-06-26 NOTE — H&P (Signed)
[name], [name]                ACCOUNT NO.:  [id number]   MEDICAL RECORD NO.:  [id number]          PATIENT TYPE:  INP   LOCATION:  1823                         FACILITY:  [facility]   PHYSICIAN:  Hollice Espy, M.D.DATE OF BIRTH:  May 25, 1920   DATE OF ADMISSION:  08/12/2006  DATE OF DISCHARGE:                              HISTORY & PHYSICAL   ATTENDING PHYSICIAN:  Valerie A. Felicity Coyer, M.D.   PRIMARY CARE PHYSICIAN:  Valetta Mole. Swords, M.D.   CHIEF COMPLAINT:  Slurred speech.   HISTORY OF PRESENT ILLNESS:  Patient is an 77 year old white female with  a past medical history of diet-controlled diabetes mellitus, CAD, status  post a recent admission to the hospital less than a week ago for chest  pain, status post cardiac catheterization, found to have diffuse disease  and hypertension, who has been doing relatively well since her discharge  on August 06, 2006.  This evening, however, she noted an episode of  slurred speech, which initially she thought lasted a little more than a  few minutes.  She became concerned and called her neighbor.  Her  neighbor came by and helped her check her blood pressure.  It was found  to be elevated in the 160s systolic.  The patient keeps a very close  monitoring of her systolic blood pressures and keeps a very close eye on  her systolic blood pressures.  Usually, they range anywhere from the  120s to the highest ever, 140.  This patient's symptoms lasted probably  less than 10 minutes; however, she then had a second episode while  talking to her neighbor, again of slurred speech.  It is a concern that  this was perhaps a ministroke.  Patient had a previous possible  ministroke-like symptoms back in April of this year when she had an  episode of right arm numbness and some dizziness.  At that time, it was  monitored but no intervention or testing was done.  Patient was brought  into the emergency room.  Her symptoms at that point have resolved.  Since  she has been in the emergency room, she has had no further  episodes.  CT scan of the head was done which was unremarkable.   Her vitals while she was in the emergency room were noted to be stable  with the exception of a blood pressure that was noted to be systolic at  170s to 180s.  Labs were ordered on the patient, which were essentially  unremarkable, including a blood sugar which was only 134.  The patient's  BUN and creatinine were very minimally elevated with a BUN of 26 and a  creatinine of 1.1.  Currently, the patient is doing well.  She denies  any headaches, visual changes, dysphagia, chest pain, palpitations,  shortness of breath, wheezing, coughing, abdominal pain.  No hematuria,  dysuria, constipation, diarrhea, focal extremity numbness, weakness, or  pain.  Her speech, again, has resolved.  Her review of systems is  otherwise negative.   PAST MEDICAL HISTORY:  1. Diffuse CAD, status post recent cath.  Preserved ejection fraction.  2. Type 2 diabetes, diet controlled.  3. Dyslipidemia.  4. Hypertension.  5. Chronic left bundle branch block.  6. Osteoarthritis.  7. GERD.   MEDICATIONS:  1. Aspirin 325 daily.  2. Lopressor 25 p.o. b.i.d.  3. Imdur 30 p.o. daily.  4. Prevacid 30 p.o. daily.  5. Fosamax 70 mg p.o. weekly.  6. Lipitor, which according to the discharge summary, is 10 mg p.o.      nightly, although according to the patient, she takes a half pill,      which will be 5 mg p.o. nightly.   ALLERGIES:  SULFA and MACRODANTIN.   SOCIAL HISTORY:  She denies any tobacco, alcohol, or hard drug use.  She  lives alone.   FAMILY HISTORY:  Noncontributory.   PHYSICAL EXAMINATION:  VITALS ON ADMISSION:  Temp 97.4, heart rate 74,  blood pressure 184/72.  It dips slightly down to 172/64.  Respirations  20.  O2 sat 100% on room air.  GENERAL:  Patient is alert and oriented x3.  No apparent distress.  HEENT:  Normocephalic and atraumatic.  Her mucous membranes are  moist.  NECK:  She has no carotid bruits.  HEART:  Regular rate and rhythm.  S1 and S2.  NEURO:  Cranial nerves II-XII are intact.  LUNGS:  Clear to auscultation bilaterally.  ABDOMEN:  Soft, nontender, nondistended.  Positive bowel sounds.  NEUROMUSCULAR:  No clubbing, cyanosis or edema.  She has no evidence of  any cerebellar dysfunction.  No evidence of any musculoskeletal  deficits.  Her strength, flexion and extension, and grip are symmetric  and equal at 5/5.  She has negative Babinski's signs.  No pronator  drift.   White count 7.6, H&H 12.4 and 37 with a MCV of 98, platelet count 248.  No shift.  Coags are unremarkable.  UA notes a moderate leukocyte  esterase but only 3-6 white cells and rare bacteria.  Sodium 130,  potassium 4.8, chloride 105, bicarb 28, BUN 26, creatinine 1.1, glucose  134.   A CT scan of the head is as per HPI, which was negative for any acute  CVA.   ASSESSMENT/PLAN:  1. Recurrent transient ischemic attacks:  Patient is already on      aspirin.  Will in the short immediate term, put her on coverage for      Lovenox.  Likely will need to change her from aspirin over to      Aggrenox.  Dr. Nash Shearer from neurology will be seeing the patient,      but in the meantime, we will order fasting lipids and homocysteine      in the morning.  Check bilateral carotid Dopplers and an MRI/MRA.  2. Hypertension:  Currently elevated.  Will continue Lopressor, Imdur,      and likely may need to increase to a higher dose.  3. Gastroesophageal reflux disease:  Continue Prevacid.  4. Hyperlipidemia:  Check a fasting lipid profile.  Continue Lipitor.  5. Diabetes mellitus:  Will put her on a sliding scale, check a      hemoglobin A1C to see if we need to start her on any type of oral      medication.      Hollice Espy, M.D.  Electronically Signed     SKK/MEDQ  D:  08/13/2006  T:  08/13/2006  Job:  [id number]   cc:   Valetta Mole. Swords, MD  Estanislado Pandy, MD

## 2010-06-29 NOTE — H&P (Signed)
[name], [name] NO.:  [id number]   MEDICAL RECORD NO.:  [id number]          PATIENT TYPE:  OIB   LOCATION:  2899                         FACILITY:  [facility]   PHYSICIAN:  Guadelupe Sabin, M.D.DATE OF BIRTH:  Aug 10, 1920   DATE OF ADMISSION:  07/20/2004  DATE OF DISCHARGE:                                HISTORY & PHYSICAL   OUTPATIENT NOTE:  This was a planned outpatient readmission of this 77-year-  old white female diabetic, non-insulin type, admitted for cataract implant  surgery of the right eye.   PRESENT ILLNESS:  This patient has noted deterioration in vision in both  eyes due to progressive cataract formation.  She was previously admitted on  September 30, 2000 for uncomplicated cataract implant surgery of the left eye.  The patient did well following this procedure with return of vision  subsequently 20/20.  Subsequently she had a YAG laser posterior capsulotomy  performed on December 28, 2002. Meanwhile the unoperated right eye has  deteriorated to 20/50 -2 and the patient has elected to proceed with similar  cataract implant surgery. She was given oral discussion and printed  information concerning the procedure and its possible complications.  She  signed an informed consent and arrangements were made for her outpatient  admission at this time.   PAST MEDICAL HISTORY:  The patient is under the care of Dr. Cato Mulligan.  The  patient is felt to be in satisfactory condition for the proposed surgery.   REVIEW OF SYSTEMS:  No cardiorespiratory complaints.   CURRENT MEDICATIONS:  The patient takes several medications under Dr.  Marliss Coots direction including Fosamax, Ambien, Lipitor, glucosamine,  multivitamins C, B and D.   PHYSICAL EXAMINATION:  VITAL SIGNS:  As recorded on admission blood pressure  130/68, pulse 63, respirations 20, temperature 97.1. GENERAL APPEARANCE:  The patient is a pleasant well-nourished, well-developed 77 year old white  female in  no acute distress.  HEENT:  <Eyes:  The eyes are white and clear with a clear cornea, deep and  clear anterior chamber.  A nuclear cataract is present in the right eye and  a clear posterior chamber well-centered lens implant in the right left eye.  Dilated detailed fundus examination shows a clear vitreous, attached retina  with normal optic nerve, blood vessels and macula.  No diabetic retinopathy  is seen in either eye.  CHEST/LUNGS:  Clear to percussion and auscultation.  HEART:  Normal sinus rhythm, no cardiomegaly.  No murmurs.  ABDOMEN:  Negative.  EXTREMITIES:  Negative.   ADMISSION DIAGNOSIS:  Senile nuclear cataract right eye, pseudophakia left  eye.   SURGICAL PLAN:  Cataract implant surgery right eye.       HNJ/MEDQ  D:  07/20/2004  T:  07/20/2004  Job:  [id number]   cc:   [name], M.D. [facility]

## 2010-06-29 NOTE — Op Note (Signed)
[place]  Patient:    [name], [name] Visit Number: [id number] MRN: [id number]          Service Type: DSU Location: RCRM 2550 01 Attending Physician:  Ivor Messier Proc. Date: 09/30/00 Adm. Date:  09/30/2000   CC:         Dr. Neta Mends. Panosh, [facility]   Operative Report  PREOPERATIVE DIAGNOSIS:  Senile cataract, left eye.  POSTOPERATIVE DIAGNOSIS:  Senile cataract, left eye.  NAME OF OPERATION:  Planned extracapsular cataract extraction - phacoemulsification, primary insertion of posterior chamber intraocular lens implant.  SURGEON:  Guadelupe Sabin, M.D.  ASSISTANT:  Nurse.  ANESTHESIA:  Local 4% Xylocaine, 0.75% Marcaine retrobulbar block, topical tetracaine, intraocular Xylocaine.   Anesthesia standby required in this diabetic patient.  OPERATIVE PROCEDURE:  After the patient was prepped and draped a speculum was inserted in the left eye.  The eye was turned downward and a superior rectus traction suture placed.  Schiotz tonometry was recorded at 10-12 scale units with a 5.5 g weight.  A peritomy was performed adjacent to the limbus from the 11 to 1 oclock position.  The corneoscleral junction was cleaned and a corneoscleral groove made with a 45 degree Superblade.  The anterior chamber was then entered with the 2.5 mm Diamond keratome at the 12 oclock position and the 15 degree blade at the 2:30 position.  Using a bent 26 gauge needle on a Healon syringe, a circular capsulorrhexis was begun and then completed with the Grabow forceps.  Hydrodissection and hydrodelineation were performed using 1% Xylocaine.  The lens nucleus was flipped vertically and the 30 degree phacoemulsification tip used to emulsify and aspirate it.  Total ultrasonic time - one minute 19 seconds; average power level - 18%; total amount of fluid used - 75 cc.  Following removal of the nucleus the residual cortex was aspirated with the irrigation  aspiration tip.  The posterior capsule appeared intact with brilliant red fundus reflex.  It was therefore elected to insert an [employer] [id number] silicon three-piece posterior chamber intraocular lens implant with UV absorber, diopter strength +20.00.  This was inserted with the McDonald forceps into the anterior chamber and then centered into the capsular bag using the [facility] lens rotator.  The lens appeared to be well centered.  The operative incisions appeared to be self-sealing and no sutures were required.  The conjunctiva was brushed forward over the operative incisions.  The Healon had been removed prior to closure and balanced salt solution and Miochol solution injected.  Maxitrol ointment was instilled in the conjunctival cul-de-sac and a light patch and protector shield applied. Duration of procedure and anesthesia administration - 45 minutes.  Patient tolerated the procedure well in general, left the operating room for the recovery room in good condition. Attending Physician:  Ivor Messier DD:  09/30/00 TD:  09/30/00 Job: [id number]

## 2010-06-29 NOTE — H&P (Signed)
[place]  Patient:    [name], [name] Visit Number: [id number] MRN: [id number]          Service Type: DSU Location: RCRM 2550 01 Attending Physician:  Ivor Messier Adm. Date:  09/30/2000   CC:         Dr. Neta Mends. Panosh, plus other lab data, EKG, or chest x-ray   History and Physical  REASON FOR ADMISSION:  This was a planned outpatient surgical admission of this 77 year old white female admitted for cataract implant surgery of the left eye.  PRESENT ILLNESS:  This patient has been followed in my office for routine eye care since February 17, 1983.  Initial examination at that time revealed a corrected acuity of 20/20 in each eye and a normal ocular exam.  Over the ensuing years the patient has noted gradual deterioration of vision due to cataract formation, nuclear and slight posterior subcapsular change in both eyes - greater in the right than left eye.  By March 2002 vision had deteriorated to 20/40 right eye, 20/60 left eye.  Patient was given new glasses but these did not seem to be of help, and she still was troubled with approximately the same acuity.  She therefore elected to proceed with cataract implant surgery of the left eye now, right eye later.  She signed an informed consent and arrangements were made for her outpatient admission at this time.  PAST MEDICAL HISTORY:  Patient is in stable general health under the care of Dr. Fabian Sharp.  Patient is currently said to be in good general health, taking limited medications, ALLERGIES to [facility] and MACRODANTIN.  She is felt by Dr. Fabian Sharp to be a low risk for the proposed surgery.  REVIEW OF SYSTEMS:  No cardiorespiratory complaints.  PHYSICAL EXAMINATION:  VITAL SIGNS:  As recorded on admission, blood pressure 152/62, respirations 18, heart rate 69, temperature 97.4.  GENERAL APPEARANCE:  Patient is a pleasant, well-nourished, well-developed white female in no acute  distress.  HEENT:  Eyes:  Visual acuity as noted above.  Applanation tonometry:  17 mm right eye, 14 mm left eye.  Slit lamp examination:  Bilateral nuclear cataract formation, slight posterior subcapsular change left eye.  Detailed fundus examination reveals a clear vitreous with normal optic nerve, blood vessels, and retina.  CHEST:  Lungs clear to percussion and auscultation.  HEART:  Normal sinus rhythm, no cardiomegaly, no murmurs.  ABDOMEN:  Negative.  EXTREMITIES:  Negative.  ADMISSION DIAGNOSIS:  Senile cataract, both eyes.  SURGICAL PLAN:  Cataract implant surgery - left eye, right eye later. Attending Physician:  Ivor Messier DD:  09/30/00 TD:  09/30/00 Job: [id number]

## 2010-06-29 NOTE — Op Note (Signed)
NAMECOSETTE, [name] NO.:  [id number]   MEDICAL RECORD NO.:  [id number]          PATIENT TYPE:  OIB   LOCATION:  2899                         FACILITY:  [facility]   PHYSICIAN:  Guadelupe Sabin, M.D.DATE OF BIRTH:  27-Aug-1920   DATE OF PROCEDURE:  07/20/2004  DATE OF DISCHARGE:                                 OPERATIVE REPORT   PREOPERATIVE DIAGNOSIS:  Senile nuclear cataract, right eye.   POSTOPERATIVE DIAGNOSIS:  Senile nuclear cataract, right eye.   OPERATION:  Planned extracapsular cataract extraction --  phacoemulsification, primary insertion of posterior chamber intraocular lens  implant.   SURGEON:  Guadelupe Sabin, M.D.   ASSISTANT:  Nurse.   ANESTHESIA:  Local 4% Xylocaine, 0.75% Marcaine retrobulbar block was Wydase  added, topical tetracaine, intraocular Xylocaine, anesthesia standby  required.   OPERATIVE PROCEDURE:  After the patient was prepped and draped, a lid  speculum was inserted in the right eye.  The eye was turned downward and a  superior rectus traction suture placed.  Schiotz tonometry was recorded at 6-  8 scale units with a 5.5-gram weight.  A peritomy was performed adjacent to  the limbus from the 11 to 1 o'clock position.  The corneoscleral junction  was cleaned and a corneoscleral groove made with a 45-degree Superblade.  The anterior chamber was then entered with a 2.5-mm diamond keratome at the  12 o'clock position and a 15-degree blade at the 2:30 position.  Using a  bent 26-gauge needle on OcuCoat syringe, a circular capsulorrhexis was begun  and then completed with the Utrata forceps.  Hydrodissection and  hydrodelineation were performed using 1% Xylocaine.  The 30-degree  phacoemulsification tip was then inserted with slow controlled  emulsification of the lens nucleus with back-cracking with the Bechert pick.  Total ultrasonic time -- 57 seconds, average power level -- 21%, total  amount of fluid used -- 50 mL.   Following removal of the nucleus, the  residual cortex was aspirated with the silicone ear irrigation-aspiration  probe.  The posterior capsule appeared intact with a brilliant red fundus  reflex; it was therefore elected to insert an [employer] [id number]  silicone three-piece posterior chamber intraocular lens implant, diopter  strength +21.50; this was inserted with the McDonald forceps into the  anterior chamber and then centered into the capsular bag using the [facility]  lens rotator.  The lens appeared to be well-centered.  The Provisc and  OcuCoat which had been used intermittently during the procedure were  aspirated replaced with balanced salt solution and Miochol ophthalmic  solution.  The operative incisions appeared to be self-sealing; it was,  however, elected to place a single 10-0 interrupted nylon suture across the  12 o'clock incision to  ensure closure and to prevent endophthalmitis.  Maxitrol ointment was  instilled in the conjunctival cul-de-sac and a light patch and protective  shield applied.  Duration of procedure and anesthesia administration -- 45  minutes.  The patient tolerated the procedure well in general and left the  operating room for the recovery room in good condition.  HNJ/MEDQ  D:  07/20/2004  T:  07/20/2004  Job:  [id number]

## 2010-07-23 ENCOUNTER — Telehealth: Payer: Self-pay | Admitting: Cardiology

## 2010-07-23 ENCOUNTER — Encounter: Payer: Self-pay | Admitting: Cardiology

## 2010-07-23 NOTE — Telephone Encounter (Signed)
Faxed Vascular Lab to [facility] ([phone]).

## 2010-07-30 ENCOUNTER — Encounter: Payer: Self-pay | Admitting: Family Medicine

## 2010-07-30 ENCOUNTER — Ambulatory Visit (INDEPENDENT_AMBULATORY_CARE_PROVIDER_SITE_OTHER): Payer: Medicare Other | Admitting: Family Medicine

## 2010-07-30 VITALS — BP 100/58 | Temp 97.9°F | Ht 66.5 in | Wt 128.0 lb

## 2010-07-30 DIAGNOSIS — E119 Type 2 diabetes mellitus without complications: Secondary | ICD-10-CM

## 2010-07-30 DIAGNOSIS — I10 Essential (primary) hypertension: Secondary | ICD-10-CM

## 2010-07-30 DIAGNOSIS — G459 Transient cerebral ischemic attack, unspecified: Secondary | ICD-10-CM

## 2010-07-30 NOTE — Progress Notes (Signed)
Quick Note:  Pt informed and she has OV scheduled in one month, she will come fasting ______

## 2010-07-30 NOTE — Progress Notes (Signed)
Subjective:    Patient ID: [name], female    DOB: 07/15/1920, 77 y.o.   MRN: [id number]  HPI Patient seen for hospital followup. Visiting family in [place] and 8 days ago while getting ready to return here she apparently had staggering gait. She did have some amnesia following that that was taken by EMS to hospital and reportedly question of TIA. She recalls several studies including carotid Dopplers and what sounds like he needs he along with either CT or MRI scanning. Apparently no clear evidence for stroke. Symptoms fully clear at this time. She does not recall any focal weakness or speech changes.  Patient has past history of transient atrial flutter, CAD, hypertension, hyperlipidemia and type 2 diabetes. Does not monitor blood sugars at home. Elevated blood sugar reportedly a hospital though she may have been receiving D5 IV fluids initially. No symptoms of hyperglycemia. Compliant with all medications. Absolutely no recurrent symptoms since discharge. No chest pains. No focal weakness. No cognitive changes. We do not have any old records at this time  Past Medical History  Diagnosis Date  . COLONIC POLYPS, ADENOMATOUS 12/26/2009  . DIABETES MELLITUS, TYPE II 08/11/2006  . HYPERLIPIDEMIA 09/09/2006  . ANEMIA, NORMOCYTIC 11/30/2009  . HYPERTENSION 09/09/2006  . CAD, NATIVE VESSEL 04/27/2008  . LEFT BUNDLE BRANCH BLOCK 08/11/2006  . Atrial flutter 04/12/2009  . TIA 09/02/2006  . HEMORRHOIDS-INTERNAL 12/26/2009  . GERD 02/16/2007  . DIVERTICULOSIS, COLON 08/11/2006  . OSTEOARTHRITIS 08/11/2006  . OSTEOPOROSIS 08/11/2006   Past Surgical History  Procedure Date  . Tonsillectomy   . Colon surgery 2001  . Eye surgery     catarac  . Closed reduction metatarsal fracture     right 5th    reports that she has never smoked. She does not have any smokeless tobacco history on file. Her alcohol and drug histories not on file. family history includes Cancer in her brother and sister; Heart  disease in her brother; Heart disease (age of onset:72) in her mother; and Heart disease (age of onset:93) in her father. Allergies  Allergen Reactions  . Doxycycline Hyclate     REACTION: upset stomach  . Nitrofurantoin     REACTION: unspecified  . Sulfamethoxazole W/Trimethoprim     REACTION: unspecified  . Sulfonamide Derivatives     REACTION: rash      Review of Systems  Constitutional: Negative for fever, chills, appetite change and unexpected weight change.  HENT: Negative for trouble swallowing.   Respiratory: Negative for cough and shortness of breath.   Cardiovascular: Negative for chest pain, palpitations and leg swelling.  Genitourinary: Negative for dysuria.  Neurological: Negative for dizziness, seizures, syncope, weakness and headaches.  Hematological: Negative for adenopathy. Does not bruise/bleed easily.       Objective:   Physical Exam  Constitutional: She is oriented to person, place, and time. She appears well-developed and well-nourished. No distress.  HENT:  Right Ear: External ear normal.  Left Ear: External ear normal.  Mouth/Throat: Oropharynx is clear and moist.  Eyes: Pupils are equal, round, and reactive to light.  Neck: Neck supple. No thyromegaly present.  Cardiovascular: Normal rate, regular rhythm and normal heart sounds.   Pulmonary/Chest: Effort normal and breath sounds normal. No respiratory distress. She has no wheezes. She has no rales.  Musculoskeletal: She exhibits no edema.  Lymphadenopathy:    She has no cervical adenopathy.  Neurological: She is alert and oriented to person, place, and time. No cranial nerve deficit.  Cerebellar normal finger to nose testing. Gait normal  Psychiatric: She has a normal mood and affect. Her behavior is normal.          Assessment & Plan:  #1 probable recent TIA. Symptoms fully clear. Send for old records. It sounds like she had complete workup. Discussed secondary prevention issues. Lipids  have been well controlled. No evidence for afebrile exam today #2 history of type 2 diabetes. Check blood sugar today although not fasting.  If up, return for fasting CBG next 2-3 weeks. #3 hyperlipidemia which has been well controlled with recent LDL 66 #4 mild hypertension which is stable

## 2010-07-30 NOTE — Patient Instructions (Signed)
Follow up promptly for any recurrent weakness, speech changes, or any new symptoms.

## 2010-07-31 ENCOUNTER — Telehealth: Payer: Self-pay | Admitting: Cardiology

## 2010-07-31 NOTE — Telephone Encounter (Signed)
Pt called and had TIA in [place].  She just wanted Dr. Riley Kill to know this and they will be sending her records to [place].  She has an appt July 10.  Does she need to be seen any sooner?

## 2010-07-31 NOTE — Telephone Encounter (Signed)
PT AWARE WILL FORWARD TO DR Riley Kill  TO REVIEW . MAY  NOT NEED EARLIER APPT PER PT FEELS OKAY HEART WISE AT THIS TIME./CY

## 2010-08-03 ENCOUNTER — Emergency Department (HOSPITAL_COMMUNITY): Payer: Medicare Other

## 2010-08-03 ENCOUNTER — Emergency Department (HOSPITAL_COMMUNITY)
Admission: EM | Admit: 2010-08-03 | Discharge: 2010-08-03 | Disposition: A | Discharge status: Home / Self Care | Payer: Medicare Other | Attending: Emergency Medicine | Admitting: Emergency Medicine

## 2010-08-03 ENCOUNTER — Ambulatory Visit: Payer: Medicare Other | Admitting: Family Medicine

## 2010-08-03 ENCOUNTER — Telehealth: Payer: Self-pay | Admitting: *Deleted

## 2010-08-03 DIAGNOSIS — E789 Disorder of lipoprotein metabolism, unspecified: Secondary | ICD-10-CM | POA: Insufficient documentation

## 2010-08-03 DIAGNOSIS — R220 Localized swelling, mass and lump, head: Secondary | ICD-10-CM | POA: Insufficient documentation

## 2010-08-03 DIAGNOSIS — M25539 Pain in unspecified wrist: Secondary | ICD-10-CM | POA: Insufficient documentation

## 2010-08-03 DIAGNOSIS — Y9229 Other specified public building as the place of occurrence of the external cause: Secondary | ICD-10-CM | POA: Insufficient documentation

## 2010-08-03 DIAGNOSIS — S1093XA Contusion of unspecified part of neck, initial encounter: Secondary | ICD-10-CM | POA: Insufficient documentation

## 2010-08-03 DIAGNOSIS — Z79899 Other long term (current) drug therapy: Secondary | ICD-10-CM | POA: Insufficient documentation

## 2010-08-03 DIAGNOSIS — S0990XA Unspecified injury of head, initial encounter: Secondary | ICD-10-CM | POA: Insufficient documentation

## 2010-08-03 DIAGNOSIS — K219 Gastro-esophageal reflux disease without esophagitis: Secondary | ICD-10-CM | POA: Insufficient documentation

## 2010-08-03 DIAGNOSIS — S0180XA Unspecified open wound of other part of head, initial encounter: Secondary | ICD-10-CM | POA: Insufficient documentation

## 2010-08-03 DIAGNOSIS — S01501A Unspecified open wound of lip, initial encounter: Secondary | ICD-10-CM | POA: Insufficient documentation

## 2010-08-03 DIAGNOSIS — I10 Essential (primary) hypertension: Secondary | ICD-10-CM | POA: Insufficient documentation

## 2010-08-03 DIAGNOSIS — S0003XA Contusion of scalp, initial encounter: Secondary | ICD-10-CM | POA: Insufficient documentation

## 2010-08-03 DIAGNOSIS — E119 Type 2 diabetes mellitus without complications: Secondary | ICD-10-CM | POA: Insufficient documentation

## 2010-08-03 DIAGNOSIS — M1990 Unspecified osteoarthritis, unspecified site: Secondary | ICD-10-CM | POA: Insufficient documentation

## 2010-08-03 DIAGNOSIS — R221 Localized swelling, mass and lump, neck: Secondary | ICD-10-CM | POA: Insufficient documentation

## 2010-08-03 DIAGNOSIS — R51 Headache: Secondary | ICD-10-CM | POA: Insufficient documentation

## 2010-08-03 NOTE — Telephone Encounter (Signed)
Pt fell and has laceration on head with big contusion which has caused a large hematoma on front of head.  Per. Dr. Clent Ridges, Pt needs to go to the ER for eval.  Pt is hesitant, but will go.

## 2010-08-08 ENCOUNTER — Ambulatory Visit (INDEPENDENT_AMBULATORY_CARE_PROVIDER_SITE_OTHER): Payer: Medicare Other | Admitting: Family Medicine

## 2010-08-08 ENCOUNTER — Encounter: Payer: Self-pay | Admitting: Family Medicine

## 2010-08-08 VITALS — BP 140/70 | Temp 98.5°F

## 2010-08-08 DIAGNOSIS — S0180XA Unspecified open wound of other part of head, initial encounter: Secondary | ICD-10-CM

## 2010-08-08 DIAGNOSIS — G459 Transient cerebral ischemic attack, unspecified: Secondary | ICD-10-CM

## 2010-08-08 DIAGNOSIS — S0003XA Contusion of scalp, initial encounter: Secondary | ICD-10-CM

## 2010-08-08 DIAGNOSIS — S0181XA Laceration without foreign body of other part of head, initial encounter: Secondary | ICD-10-CM

## 2010-08-08 DIAGNOSIS — S0083XA Contusion of other part of head, initial encounter: Secondary | ICD-10-CM

## 2010-08-08 NOTE — Progress Notes (Signed)
  Subjective:    Patient ID: [name], female    DOB: 07-14-1920, 77 y.o.   MRN: [id number]  HPI Patient seen for followup recent laceration. She had recent TIA out of town and those records have come for review.  No recurrent TIA symptoms.  She was walking and tripped in the parking lot 5 days ago. There was no loss of consciousness. She denies any focal weakness or any recurrent TIA type symptoms. She had no loss of consciousness or syncope. She had laceration of lip along with hematomas of the face. Evaluation including maxillofacial CT scan and CT head unremarkable with the exception of a couple of hematomas. She's had extensive bruising no headaches. No dizziness.  Sutures just below right lip . Absorbable sutures in the right lip   Review of Systems  Constitutional: Negative for fever, chills and activity change.  Respiratory: Negative for shortness of breath.   Cardiovascular: Negative for chest pain.  Musculoskeletal: Negative for back pain and gait problem.  Neurological: Negative for dizziness, seizures, syncope, weakness and headaches.  Hematological: Negative for adenopathy.       Objective:   Physical Exam  Constitutional: She appears well-developed and well-nourished.  HENT:  Right Ear: External ear normal.  Left Ear: External ear normal.  Mouth/Throat: Oropharynx is clear and moist.       Patient has extensive bruising involving the face. She has small hematoma right forehead and another small hematoma right side of nose. No fluctuance or evidence for abscess. No cellulitis changes. Laceration right lower lip which is healing well. She has approx. 1 cm laceration just below the right lower lip and 3  sutures are removed without difficulty. No signs of secondary infection  Eyes: Pupils are equal, round, and reactive to light.  Cardiovascular: Normal rate and regular rhythm.   Pulmonary/Chest: Breath sounds normal. No respiratory distress. She has no wheezes. She has no  rales.          Assessment & Plan:  #1Right face and right lip lacerations healing well. Sutures removed. No signs of secondary infection #2 hematomas face. Reassurance given. No fractures on x-rays #3 recent TIA. No recurrent symptoms. Data reviewed from her recent hospitalization. Echocardiogram revealed no significant abnormalities as she had bubble study with no conduction defect between atria. Carotid Dopplers revealed no critical obstruction.

## 2010-08-21 ENCOUNTER — Encounter: Payer: Self-pay | Admitting: Cardiology

## 2010-08-21 ENCOUNTER — Ambulatory Visit (INDEPENDENT_AMBULATORY_CARE_PROVIDER_SITE_OTHER): Payer: Medicare Other | Admitting: Cardiology

## 2010-08-21 DIAGNOSIS — Z9181 History of falling: Secondary | ICD-10-CM

## 2010-08-21 DIAGNOSIS — E785 Hyperlipidemia, unspecified: Secondary | ICD-10-CM

## 2010-08-21 DIAGNOSIS — R51 Headache: Secondary | ICD-10-CM

## 2010-08-21 DIAGNOSIS — I2510 Atherosclerotic heart disease of native coronary artery without angina pectoris: Secondary | ICD-10-CM

## 2010-08-21 DIAGNOSIS — G459 Transient cerebral ischemic attack, unspecified: Secondary | ICD-10-CM

## 2010-08-21 NOTE — Patient Instructions (Signed)
Your physician recommends that you schedule a follow-up appointment in: 1 month with Dr. Fredrik Cove have been referred to see Dr. Pearlean Brownie with [facility].

## 2010-08-28 ENCOUNTER — Encounter: Payer: Self-pay | Admitting: Family Medicine

## 2010-08-28 ENCOUNTER — Ambulatory Visit (INDEPENDENT_AMBULATORY_CARE_PROVIDER_SITE_OTHER): Payer: Medicare Other | Admitting: Family Medicine

## 2010-08-28 DIAGNOSIS — G459 Transient cerebral ischemic attack, unspecified: Secondary | ICD-10-CM

## 2010-08-28 DIAGNOSIS — E785 Hyperlipidemia, unspecified: Secondary | ICD-10-CM

## 2010-08-28 DIAGNOSIS — I10 Essential (primary) hypertension: Secondary | ICD-10-CM

## 2010-08-28 DIAGNOSIS — S0003XA Contusion of scalp, initial encounter: Secondary | ICD-10-CM

## 2010-08-28 DIAGNOSIS — S0083XA Contusion of other part of head, initial encounter: Secondary | ICD-10-CM

## 2010-08-28 NOTE — Progress Notes (Signed)
Subjective:    Patient ID: [name], female    DOB: 1920-11-18, 77 y.o.   MRN: [id number]  HPI Patient seen for followup. Recent TIA with no recurrence since then. She subsequently several days later had a fall which was not related to syncope but she actually tripped. Facial trauma. Refer to last note. No facial fractures. She had extensive ecchymosis involving face which has resolved. No headaches or dizziness. She is still driving but has been cautioned about risk with recurrent TIA. She is seeing neurologist. She is nonsmoker. Blood pressure and lipids well controlled. Denies any recent speech changes, focal weakness, or confusion. Had extensive studies during hospitalization with no significant ICA stenosis. Has had previous atrial flutter but currently regular rhythm and no recent palpitations. Medications reviewed and compliant with all. She remains on Plavix and baby aspirin  Past Medical History  Diagnosis Date  . COLONIC POLYPS, ADENOMATOUS 12/26/2009  . DIABETES MELLITUS, TYPE II 08/11/2006  . HYPERLIPIDEMIA 09/09/2006  . ANEMIA, NORMOCYTIC 11/30/2009  . HYPERTENSION 09/09/2006  . CAD, NATIVE VESSEL 04/27/2008  . LEFT BUNDLE BRANCH BLOCK 08/11/2006  . Atrial flutter 04/12/2009  . TIA 09/02/2006  . HEMORRHOIDS-INTERNAL 12/26/2009  . GERD 02/16/2007  . DIVERTICULOSIS, COLON 08/11/2006  . OSTEOARTHRITIS 08/11/2006  . OSTEOPOROSIS 08/11/2006   Past Surgical History  Procedure Date  . Tonsillectomy   . Colon surgery 2001  . Eye surgery     catarac  . Closed reduction metatarsal fracture     right 5th    reports that she has never smoked. She does not have any smokeless tobacco history on file. Her alcohol and drug histories not on file. family history includes Cancer in her brother and sister; Heart disease in her brother; Heart disease (age of onset:72) in her mother; and Heart disease (age of onset:93) in her father. Allergies  Allergen Reactions  . Doxycycline Hyclate    REACTION: upset stomach  . Nitrofurantoin     REACTION: unspecified  . Sulfamethoxazole W/Trimethoprim     REACTION: unspecified  . Sulfonamide Derivatives     REACTION: rash      Review of Systems  Constitutional: Negative for fever, chills, activity change, appetite change and unexpected weight change.  Respiratory: Negative for cough and shortness of breath.   Cardiovascular: Negative for chest pain, palpitations and leg swelling.  Neurological: Negative for dizziness and headaches.  Hematological: Does not bruise/bleed easily.  Psychiatric/Behavioral: Negative for confusion.       Objective:   Physical Exam  Constitutional: She is oriented to person, place, and time. She appears well-developed and well-nourished. No distress.  HENT:       Ecchymosis from last visit has essentially resolved. She has small resolving hematoma right forehead and minimal swelling right bridge of nose. Previous x-rays did not reveal nasal fracture. No evidence for intranasal hematoma  Cardiovascular: Normal rate and regular rhythm.   Pulmonary/Chest: Effort normal and breath sounds normal. No respiratory distress. She has no wheezes. She has no rales.  Neurological: She is alert and oriented to person, place, and time.  Psychiatric: She has a normal mood and affect. Her behavior is normal.          Assessment & Plan:  #1 recent fall facial trauma which is greatly improved. We explained hematomas may take several weeks to fully resolve #2 recent TIA with no recurrent symptoms. She has seen neurologist. No change of medication. Cautioned about driving #3 hypertension well controlled #4 hyperlipidemia with good control  of recent LDL 66

## 2010-09-06 ENCOUNTER — Telehealth: Payer: Self-pay | Admitting: Family Medicine

## 2010-09-06 MED ORDER — ZOLPIDEM TARTRATE 10 MG PO TABS: 10.0000 mg | ORAL_TABLET | Freq: Every evening | ORAL | Status: DC | PRN

## 2010-09-06 MED ORDER — METOPROLOL TARTRATE 25 MG PO TABS: ORAL_TABLET | ORAL | Status: DC

## 2010-09-06 NOTE — Telephone Encounter (Signed)
Ambien last filled 03-07-10, #90 with 1 refill [facility] fax [phone]

## 2010-09-06 NOTE — Telephone Encounter (Signed)
Please fax refills for Ambien and metoprolol to [facility]---[phone].

## 2010-09-06 NOTE — Telephone Encounter (Signed)
Ok to refill for 6 months 

## 2010-09-09 NOTE — Assessment & Plan Note (Signed)
Not quite clear what happened.  She has had problems with bleeding in past, and has been on DAPT, but not warfarin.  This is of concern, especially in light of recent trip as noted, and also with hemorrhoidal bleeding in the past.  It might be necessary to consider an event monitor, although she has had no symptoms of palpitations or rapid heart beat.   She is currently in SR.  Will get her back to see Dr. Pearlean Brownie, but it is not clear that a change in therapy is imminent.  She has had the appropriate tests, recent CT scan, and with limitations of therapy not clear.  While DAPT does not carry the same event reduction as anticoagulation, her original event was also not clear in terms of etiology.  She could as likely have aortic atheroemboli as any residua from upper chamber arrhythmias, and treatment is not so easy given the limitations.  Moreover, she does not like the idea of anticoagulation.  Will monitor in one month for recurrence, consider event monitor.

## 2010-09-09 NOTE — Assessment & Plan Note (Signed)
Stable at present.  No chest pain.  Continue medical therapy with DAPT.

## 2010-09-09 NOTE — Assessment & Plan Note (Signed)
At target, and followed by Dr. Candace Cruise.

## 2010-09-09 NOTE — Progress Notes (Signed)
HPI:  Since last seen she had what was thought to be a TIA in [place].  She then fell after she got home, and had some scans done at the hospital.  She apparently tripped in a store parking lot trying to get back into her car, had facial and wrist trauma, and was evaluated in the ER.  With regard to the TIA, she apparently was a bit confused.   She is back to her usual self at this point in time, but we have concerns about her in terms now of her ability to function independently.  I told her she should not drive.  Her hemorrhoids are much better.  She has not had recent bleeding.  She has not been felt to be a good warfarin candidate, in part with her age, and bleeding.  She has remained on DAPT.  She has been noted to have atrial arrhythmias.  Evaluation in [place] apparently demonstrated no carotid progression, and neg bubble study without source for stroke.    Her August 03, 2010 CT scan is as noted:  Comparison: 08/12/2006    Findings: There is no acute intracranial hemorrhage, infarction, or   mass lesion.  There is diffuse cerebral cortical and cerebellar   atrophy, stable.    No acute osseous abnormality.  There is a soft tissue hematoma   around the right orbit and around the right side of the nose.    IMPRESSION:    1.  No acute intracranial abnormality.  Atrophy.   2.  Soft tissue hematomas around the right side of the nose and   around the right orbit.    Original Report Authenticated By: Gwynn Burly, M.D.  She has had slow healing of this since discharge.   Current Outpatient Prescriptions  Medication Sig Dispense Refill  . ANUCORT-HC 25 MG suppository INSERT ONE SUPPOSITORY RECTALLY AT BEDTIME FOR 7 DAYS  10 each  1  . aspirin 81 MG tablet Take 81 mg by mouth daily.        . clopidogrel (PLAVIX) 75 MG tablet Take 1 tablet (75 mg total) by mouth daily.  90 tablet  3  . hydrocortisone (PREPARATION H HYDROCORTISONE) 1 % cream Apply topically 2 (two) times daily. As needed         . hydrocortisone-pramoxine (ANALPRAM-HC) 2.5-1 % rectal cream Place rectally 2 (two) times daily. As needed  30 g  1  . Multiple Vitamins-Minerals (OCUVITE ADULT 50+ PO) Take by mouth daily.        . pantoprazole (PROTONIX) 40 MG tablet Take 40 mg by mouth. 1/2 tab daily       . simvastatin (ZOCOR) 40 MG tablet Take 1 tablet (40 mg total) by mouth at bedtime.  7 tablet  0  . metoprolol tartrate (LOPRESSOR) 25 MG tablet 1/2 tab by mouth twice daily  90 tablet  2  . zolpidem (AMBIEN) 10 MG tablet Take 1 tablet (10 mg total) by mouth at bedtime as needed.  90 tablet  1    Allergies  Allergen Reactions  . Doxycycline Hyclate     REACTION: upset stomach  . Nitrofurantoin     REACTION: unspecified  . Sulfamethoxazole W/Trimethoprim     REACTION: unspecified  . Sulfonamide Derivatives     REACTION: rash    Past Medical History  Diagnosis Date  . COLONIC POLYPS, ADENOMATOUS 12/26/2009  . DIABETES MELLITUS, TYPE II 08/11/2006  . HYPERLIPIDEMIA 09/09/2006  . ANEMIA, NORMOCYTIC 11/30/2009  . HYPERTENSION 09/09/2006  .  CAD, NATIVE VESSEL 04/27/2008  . LEFT BUNDLE BRANCH BLOCK 08/11/2006  . Atrial flutter 04/12/2009  . TIA 09/02/2006  . HEMORRHOIDS-INTERNAL 12/26/2009  . GERD 02/16/2007  . DIVERTICULOSIS, COLON 08/11/2006  . OSTEOARTHRITIS 08/11/2006  . OSTEOPOROSIS 08/11/2006    Past Surgical History  Procedure Date  . Tonsillectomy   . Colon surgery 2001  . Eye surgery     catarac  . Closed reduction metatarsal fracture     right 5th    Family History  Problem Relation Age of Onset  . Heart disease Mother 14    MI  . Heart disease Father 52    CHF  . Cancer Sister     breast, uterine  . Cancer Brother     colon  . Heart disease Brother     History   Social History  . Marital Status: Widowed    Spouse Name: N/A    Number of Children: N/A  . Years of Education: N/A   Occupational History  . Not on file.   Social History Main Topics  . Smoking status: Never Smoker   .  Smokeless tobacco: Not on file  . Alcohol Use: Not on file  . Drug Use: Not on file  . Sexually Active: Not on file   Other Topics Concern  . Not on file   Social History Narrative  . No narrative on file    ROS: Please see the HPI.  All other systems reviewed and negative.  PHYSICAL EXAM:  BP 114/68  Pulse 67  Resp 16  Ht 5\' 7"  (1.702 m)  Wt 127 lb (57.607 kg)  BMI 19.89 kg/m2  General: Well developed, well nourished, in no acute distress. Head:  Normocephalic and atraumatic.  Resolving orbit hematoma on right face.   Neck: no JVD Lungs: Clear to auscultation and percussion. Heart: Normal S1 and S2.  No murmur, rubs or gallops.  Abdomen:  Normal bowel sounds; soft; non tender; no organomegaly Pulses: Pulses normal in all 4 extremities. Extremities: No clubbing or cyanosis. No edema. Neurologic: Alert and oriented x 3.  No definite focal abnormality  EKG:  NSR.  LBBB. ASSESSMENT AND PLAN:

## 2010-09-11 ENCOUNTER — Encounter: Payer: Self-pay | Admitting: Cardiology

## 2010-09-17 ENCOUNTER — Telehealth: Payer: Self-pay | Admitting: Cardiology

## 2010-09-17 NOTE — Telephone Encounter (Signed)
This appointment with Dr. Pearlean Brownie is currently being scheduled. We will call Ms. Stjulien as soon as this appointment is scheduled.

## 2010-09-17 NOTE — Telephone Encounter (Signed)
Per [name] call, [name] said when she was here one month ago she was told nurse would schedule appt for [name] to see Dr. Pearlean Brownie (neurologists). [name] never received phone call informing her if and when appt was scheduled. Please return [name] call to advise/discuss.

## 2010-09-19 ENCOUNTER — Ambulatory Visit (INDEPENDENT_AMBULATORY_CARE_PROVIDER_SITE_OTHER): Payer: Medicare Other | Admitting: Cardiology

## 2010-09-19 ENCOUNTER — Encounter: Payer: Self-pay | Admitting: Cardiology

## 2010-09-19 ENCOUNTER — Telehealth: Payer: Self-pay | Admitting: Cardiology

## 2010-09-19 DIAGNOSIS — E785 Hyperlipidemia, unspecified: Secondary | ICD-10-CM

## 2010-09-19 DIAGNOSIS — G459 Transient cerebral ischemic attack, unspecified: Secondary | ICD-10-CM

## 2010-09-19 DIAGNOSIS — I2510 Atherosclerotic heart disease of native coronary artery without angina pectoris: Secondary | ICD-10-CM

## 2010-09-19 NOTE — Telephone Encounter (Signed)
[name] came in to sign a ROI authorizing [facility] to release records to Dr. Modesto Charon at [facility]. After faxing a copy of the ROI to [facility], I scanned the original.

## 2010-09-19 NOTE — Progress Notes (Signed)
Addended by: Ellender Hose on: 09/19/2010 11:23 AM   Modules accepted: Orders

## 2010-09-19 NOTE — Assessment & Plan Note (Signed)
Last LDL was at target.   

## 2010-09-19 NOTE — Assessment & Plan Note (Signed)
No new symptoms.  Continuing on DAPT.

## 2010-09-19 NOTE — Progress Notes (Signed)
HPI:  Patient is seen in follow up.  She has done well since the last visit.  No new problems.  She has avoided falling now, fortunately.  I have encouraged her to be careful.  She is considering moving to [facility] with her son early next year.    She is stable at this point.  Denies progressive symptoms at this point.  No new neuro symptoms.   Current Outpatient Prescriptions  Medication Sig Dispense Refill  . ANUCORT-HC 25 MG suppository INSERT ONE SUPPOSITORY RECTALLY AT BEDTIME FOR 7 DAYS  10 each  1  . aspirin 81 MG tablet Take 81 mg by mouth daily.        . calcium carbonate 200 MG capsule Take 250 mg by mouth 2 (two) times daily with a meal.        . clopidogrel (PLAVIX) 75 MG tablet Take 1 tablet (75 mg total) by mouth daily.  90 tablet  3  . fish oil-omega-3 fatty acids 1000 MG capsule Take 1 g by mouth daily.        . hydrocortisone (PREPARATION H HYDROCORTISONE) 1 % cream Apply topically 2 (two) times daily. As needed       . metoprolol tartrate (LOPRESSOR) 25 MG tablet 1/2 tab by mouth twice daily  90 tablet  2  . Multiple Vitamin (MULTIVITAMIN) tablet Take 1 tablet by mouth daily.        . Multiple Vitamins-Minerals (OCUVITE ADULT 50+ PO) Take by mouth daily.        . pantoprazole (PROTONIX) 40 MG tablet Take 40 mg by mouth. 1/2 tab daily       . simvastatin (ZOCOR) 40 MG tablet Take 1 tablet (40 mg total) by mouth at bedtime.  7 tablet  0  . zolpidem (AMBIEN) 10 MG tablet Take 1 tablet (10 mg total) by mouth at bedtime as needed.  90 tablet  1    Allergies  Allergen Reactions  . Doxycycline Hyclate     REACTION: upset stomach  . Nitrofurantoin     REACTION: unspecified  . Sulfamethoxazole W/Trimethoprim     REACTION: unspecified  . Sulfonamide Derivatives     REACTION: rash    Past Medical History  Diagnosis Date  . COLONIC POLYPS, ADENOMATOUS 12/26/2009  . DIABETES MELLITUS, TYPE II 08/11/2006  . HYPERLIPIDEMIA 09/09/2006  . ANEMIA, NORMOCYTIC 11/30/2009  .  HYPERTENSION 09/09/2006  . CAD, NATIVE VESSEL 04/27/2008  . LEFT BUNDLE BRANCH BLOCK 08/11/2006  . Atrial flutter 04/12/2009  . TIA 09/02/2006  . HEMORRHOIDS-INTERNAL 12/26/2009  . GERD 02/16/2007  . DIVERTICULOSIS, COLON 08/11/2006  . OSTEOARTHRITIS 08/11/2006  . OSTEOPOROSIS 08/11/2006    Past Surgical History  Procedure Date  . Tonsillectomy   . Colon surgery 2001  . Eye surgery     catarac  . Closed reduction metatarsal fracture     right 5th    Family History  Problem Relation Age of Onset  . Heart disease Mother 50    MI  . Heart disease Father 41    CHF  . Cancer Sister     breast, uterine  . Cancer Brother     colon  . Heart disease Brother     History   Social History  . Marital Status: Widowed    Spouse Name: N/A    Number of Children: N/A  . Years of Education: N/A   Occupational History  . Not on file.   Social History Main Topics  . Smoking  status: Never Smoker   . Smokeless tobacco: Not on file  . Alcohol Use: Not on file  . Drug Use: Not on file  . Sexually Active: Not on file   Other Topics Concern  . Not on file   Social History Narrative  . No narrative on file    ROS: Please see the HPI.  All other systems reviewed and negative.  PHYSICAL EXAM:  BP 124/72  Pulse 67  Ht 5\' 6"  (1.676 m)  Wt 127 lb (57.607 kg)  BMI 20.50 kg/m2  General: Well developed, well nourished, in no acute distress. Head:  Normocephalic and atraumatic. Neck: no JVD Lungs: Clear to auscultation and percussion. Heart: Normal S1 and widely split S2.  Minimal SEM.  No DM.    Abdomen:  Normal bowel sounds; soft; non tender; no organomegaly Pulses: Pulses normal in all 4 extremities. Extremities: No clubbing or cyanosis. No edema. Neurologic: Alert and oriented x 3.  EKG:  ASSESSMENT AND PLAN:

## 2010-09-19 NOTE — Patient Instructions (Signed)
Your physician recommends that you schedule a follow-up appointment in: 6 weeks with Dr. Riley Kill

## 2010-09-19 NOTE — Assessment & Plan Note (Signed)
No further symptoms.  Has had some AF documented in the past.  However, no palpitations noted.  Also, has hemorrhoids and never thought to be a good coumadin candidate at 89 despite moderately high [place] score.  Have kept her on DAPT, and this may be the best choice at this point in time.  I went through this in some detail.  Had recent fall with head trauma, so this is also an omen to potential.

## 2010-09-20 NOTE — Telephone Encounter (Signed)
Records received from [facility], faxed to [facility] @  [phone]   09/20/10/km

## 2010-10-05 ENCOUNTER — Ambulatory Visit (INDEPENDENT_AMBULATORY_CARE_PROVIDER_SITE_OTHER): Payer: Medicare Other | Admitting: Family Medicine

## 2010-10-05 ENCOUNTER — Encounter: Payer: Self-pay | Admitting: Family Medicine

## 2010-10-05 VITALS — BP 120/60 | Temp 98.1°F | Wt 127.0 lb

## 2010-10-05 DIAGNOSIS — I2510 Atherosclerotic heart disease of native coronary artery without angina pectoris: Secondary | ICD-10-CM

## 2010-10-05 DIAGNOSIS — R0600 Dyspnea, unspecified: Secondary | ICD-10-CM

## 2010-10-05 DIAGNOSIS — D649 Anemia, unspecified: Secondary | ICD-10-CM

## 2010-10-05 DIAGNOSIS — R0609 Other forms of dyspnea: Secondary | ICD-10-CM

## 2010-10-05 LAB — CBC WITH DIFFERENTIAL/PLATELET
Basophils Absolute: 0 10*3/uL (ref 0.0–0.1)
Eosinophils Relative: 4 % (ref 0–5)
HCT: 34.6 % — ABNORMAL LOW (ref 36.0–46.0)
Hemoglobin: 11.2 g/dL — ABNORMAL LOW (ref 12.0–15.0)
Lymphocytes Relative: 36 % (ref 12–46)
Lymphs Abs: 2.7 10*3/uL (ref 0.7–4.0)
MCV: 97.7 fL (ref 78.0–100.0)
Monocytes Absolute: 0.6 10*3/uL (ref 0.1–1.0)
Monocytes Relative: 8 % (ref 3–12)
Neutro Abs: 3.8 10*3/uL (ref 1.7–7.7)
RBC: 3.54 MIL/uL — ABNORMAL LOW (ref 3.87–5.11)
RDW: 13.4 % (ref 11.5–15.5)
WBC: 7.5 10*3/uL (ref 4.0–10.5)

## 2010-10-05 MED ORDER — CEFUROXIME AXETIL 250 MG PO TABS: 250.0000 mg | ORAL_TABLET | Freq: Two times a day (BID) | ORAL | Status: AC

## 2010-10-05 NOTE — Progress Notes (Signed)
Subjective:    Patient ID: [name], female    DOB: September 10, 1920, 77 y.o.   MRN: [id number]  HPI 77 year old female with history of type 2 diabetes, hyperlipidemia, hypertension, CAD, left bundle branch block, atrial flutter seen with generally not feeling well over the past week. She complains of some cough which has been productive past few days. No fever or chills. Does have some dyspnea with exertion but denies any chest pain. No orthopnea or peripheral edema. Patient is nonsmoker. No history of chronic lung disease.  She reportedly has history of CAD but I am unavailable pullout last catheterization report on her current system.  She denies any significant nasal congestion. No nausea, vomiting, diaphoresis, or any pleuritic pain. She has history of GERD which has been well controlled with Protonix. Nonsmoker  Past Medical History  Diagnosis Date  . COLONIC POLYPS, ADENOMATOUS 12/26/2009  . DIABETES MELLITUS, TYPE II 08/11/2006  . HYPERLIPIDEMIA 09/09/2006  . ANEMIA, NORMOCYTIC 11/30/2009  . HYPERTENSION 09/09/2006  . CAD, NATIVE VESSEL 04/27/2008  . LEFT BUNDLE BRANCH BLOCK 08/11/2006  . Atrial flutter 04/12/2009  . TIA 09/02/2006  . HEMORRHOIDS-INTERNAL 12/26/2009  . GERD 02/16/2007  . DIVERTICULOSIS, COLON 08/11/2006  . OSTEOARTHRITIS 08/11/2006  . OSTEOPOROSIS 08/11/2006   Past Surgical History  Procedure Date  . Tonsillectomy   . Colon surgery 2001  . Eye surgery     catarac  . Closed reduction metatarsal fracture     right 5th    reports that she has never smoked. She does not have any smokeless tobacco history on file. Her alcohol and drug histories not on file. family history includes Cancer in her brother and sister; Heart disease in her brother; Heart disease (age of onset:72) in her mother; and Heart disease (age of onset:93) in her father. Allergies  Allergen Reactions  . Doxycycline Hyclate     REACTION: upset stomach  . Nitrofurantoin     REACTION: unspecified  .  Sulfamethoxazole W/Trimethoprim     REACTION: unspecified  . Sulfonamide Derivatives     REACTION: rash      Review of Systems  Constitutional: Negative for fever, chills and unexpected weight change.  HENT: Negative for trouble swallowing.   Respiratory: Positive for cough and shortness of breath. Negative for wheezing.   Cardiovascular: Negative for chest pain, palpitations and leg swelling.  Gastrointestinal: Negative for abdominal pain.  Genitourinary: Negative for dysuria.  Neurological: Negative for headaches.  Hematological: Negative for adenopathy.  Psychiatric/Behavioral: Negative for confusion.       Objective:   Physical Exam  Constitutional: She is oriented to person, place, and time. She appears well-developed and well-nourished. No distress.  HENT:  Right Ear: External ear normal.  Left Ear: External ear normal.  Mouth/Throat: Oropharynx is clear and moist.  Neck: Neck supple.  Cardiovascular: Normal rate and regular rhythm.   Pulmonary/Chest: Effort normal.       Faint crackles R base.  Few scattered faint wheezes.  No retractions.  Normal resp rate.  Able to ambulate 90 feet without difficulty.  Musculoskeletal: She exhibits no edema.  Lymphadenopathy:    She has no cervical adenopathy.  Neurological: She is alert and oriented to person, place, and time.  Psychiatric: She has a normal mood and affect. Her behavior is normal.          Assessment & Plan:  Dyspnea. Somewhat difficult to sort out. She has some productive cough and suspect she has respiratory infection though not clearly pneumonia. She  has no evidence for fever. Does have a few faint crackles right base and will start Ceftin 250 mg twice daily. Labs with BNP and CBC (hx of anemia). EKG reveals left bundle branch block. Does have some inverted T waves leads 3 and aVF but difficult to determine significance with left bundle branch block.  Patient is aware to go to the emergency room promptly if  she has any chest pain or worsening symptoms over the weekend.  She does not have any hypoxia with O2 sats 98% even after ambulating 90 feet.

## 2010-10-05 NOTE — Patient Instructions (Signed)
Avoid heavy exertional activities over the weekend Followup immediately or go to emergency room Department if you develop any chest discomfort or any progressive shortness of breath

## 2010-10-08 ENCOUNTER — Ambulatory Visit: Payer: Medicare Other | Admitting: Neurology

## 2010-10-08 ENCOUNTER — Encounter: Payer: Self-pay | Admitting: Family Medicine

## 2010-10-08 ENCOUNTER — Ambulatory Visit (INDEPENDENT_AMBULATORY_CARE_PROVIDER_SITE_OTHER): Payer: Medicare Other | Admitting: Family Medicine

## 2010-10-08 ENCOUNTER — Ambulatory Visit (INDEPENDENT_AMBULATORY_CARE_PROVIDER_SITE_OTHER)
Admission: RE | Admit: 2010-10-08 | Discharge: 2010-10-08 | Disposition: A | Discharge status: Home / Self Care | Payer: Medicare Other | Source: Ambulatory Visit | Attending: Family Medicine | Admitting: Family Medicine

## 2010-10-08 VITALS — BP 140/60 | Temp 98.1°F | Wt 129.0 lb

## 2010-10-08 DIAGNOSIS — R05 Cough: Secondary | ICD-10-CM

## 2010-10-08 DIAGNOSIS — R5381 Other malaise: Secondary | ICD-10-CM

## 2010-10-08 DIAGNOSIS — R5383 Other fatigue: Secondary | ICD-10-CM

## 2010-10-08 LAB — TSH: TSH: 2.72 u[IU]/mL (ref 0.35–5.50)

## 2010-10-08 NOTE — Progress Notes (Signed)
  Subjective:    Patient ID: [name], female    DOB: 08/25/1920, 77 y.o.   MRN: [id number]  HPI Followup dyspnea. Refer to recent note. Patient did have some cough but no fever. BNP level normal. CBC indicated a stable hemoglobin. Normal white blood cell count. Patient has left bundle-branch block on EKG which is not new. She is stable at this time. Feels less dyspneic and less cough compared to Friday. We placed her on Ceftin 250 mg twice daily because of some faint crackles in her bases.  CXR not done Friday as she was here at end of clinic and pt was stable and did not wish to go to hospital.  She denies any chest discomfort whatsoever. Does have history of CAD.  Patient also relates some progressive fatigue over recent months. Diffuse alopecia. No recent thyroid on record. Occasional constipation. Weight stable. No cold intolerance  Past Medical History  Diagnosis Date  . COLONIC POLYPS, ADENOMATOUS 12/26/2009  . DIABETES MELLITUS, TYPE II 08/11/2006  . HYPERLIPIDEMIA 09/09/2006  . ANEMIA, NORMOCYTIC 11/30/2009  . HYPERTENSION 09/09/2006  . CAD, NATIVE VESSEL 04/27/2008  . LEFT BUNDLE BRANCH BLOCK 08/11/2006  . Atrial flutter 04/12/2009  . TIA 09/02/2006  . HEMORRHOIDS-INTERNAL 12/26/2009  . GERD 02/16/2007  . DIVERTICULOSIS, COLON 08/11/2006  . OSTEOARTHRITIS 08/11/2006  . OSTEOPOROSIS 08/11/2006   Past Surgical History  Procedure Date  . Tonsillectomy   . Colon surgery 2001  . Eye surgery     catarac  . Closed reduction metatarsal fracture     right 5th    reports that she has never smoked. She does not have any smokeless tobacco history on file. Her alcohol and drug histories not on file. family history includes Cancer in her brother and sister; Heart disease in her brother; Heart disease (age of onset:72) in her mother; and Heart disease (age of onset:93) in her father. Allergies  Allergen Reactions  . Doxycycline Hyclate     REACTION: upset stomach  . Nitrofurantoin    REACTION: unspecified  . Sulfamethoxazole W/Trimethoprim     REACTION: unspecified  . Sulfonamide Derivatives     REACTION: rash      Review of Systems  Constitutional: Negative for fever, chills, appetite change and unexpected weight change.  HENT: Negative for trouble swallowing.   Respiratory: Positive for cough. Negative for wheezing.   Cardiovascular: Negative for chest pain, palpitations and leg swelling.  Gastrointestinal: Negative for abdominal pain.  Genitourinary: Negative for dysuria.       Objective:   Physical Exam  Constitutional: She is oriented to person, place, and time. She appears well-developed and well-nourished. No distress.  HENT:  Right Ear: External ear normal.  Left Ear: External ear normal.  Mouth/Throat: Oropharynx is clear and moist.  Neck: Neck supple.  Cardiovascular: Normal rate and regular rhythm.   Pulmonary/Chest: Effort normal. She has no wheezes.       Patient has faint rales right base  Musculoskeletal: She exhibits no edema.  Lymphadenopathy:    She has no cervical adenopathy.  Neurological: She is alert and oriented to person, place, and time.  Skin: No rash noted.          Assessment & Plan:  #1 recent dyspnea. Probably related respiratory illness.  chest x-ray and finish out the Ceftin. #2 fatigue and reported generalized alopecia. Check TSH

## 2010-10-09 ENCOUNTER — Ambulatory Visit: Payer: Medicare Other | Admitting: Family Medicine

## 2010-10-10 NOTE — Progress Notes (Signed)
Quick Note:  Pt informed ______ 

## 2010-10-10 NOTE — Progress Notes (Signed)
Quick Note:  Pt informed. Pt wanted to know if Dr Caryl Never could explain the difference in her EKG's again to her as she did not understand late last Friday [phone] ______

## 2010-10-12 NOTE — Progress Notes (Signed)
Quick Note:  Pt informed ______ 

## 2010-10-17 ENCOUNTER — Telehealth: Payer: Self-pay | Admitting: *Deleted

## 2010-10-17 NOTE — Telephone Encounter (Signed)
Pt wants Dr. Caryl Never to know she saw the Opthamalogist and they believe she had a small TIA.  Is scheduled with Dr. Riley Kill tomorrow.

## 2010-10-18 ENCOUNTER — Ambulatory Visit (INDEPENDENT_AMBULATORY_CARE_PROVIDER_SITE_OTHER): Payer: Medicare Other | Admitting: Physician Assistant

## 2010-10-18 ENCOUNTER — Encounter: Payer: Self-pay | Admitting: Physician Assistant

## 2010-10-18 VITALS — BP 132/54 | HR 80 | Ht 67.0 in | Wt 126.0 lb

## 2010-10-18 DIAGNOSIS — I6529 Occlusion and stenosis of unspecified carotid artery: Secondary | ICD-10-CM

## 2010-10-18 DIAGNOSIS — G453 Amaurosis fugax: Secondary | ICD-10-CM | POA: Insufficient documentation

## 2010-10-18 DIAGNOSIS — I10 Essential (primary) hypertension: Secondary | ICD-10-CM

## 2010-10-18 DIAGNOSIS — I4892 Unspecified atrial flutter: Secondary | ICD-10-CM

## 2010-10-18 DIAGNOSIS — H3400 Transient retinal artery occlusion, unspecified eye: Secondary | ICD-10-CM

## 2010-10-18 DIAGNOSIS — I2510 Atherosclerotic heart disease of native coronary artery without angina pectoris: Secondary | ICD-10-CM

## 2010-10-18 NOTE — Assessment & Plan Note (Addendum)
She has moderate disease on the left.  Her eye symptoms were also on the left.  I discussed her case today with Dr. Riley Kill.  We will refer her to vascular surgery for further evaluation and management.  Continue dual antiplatelet therapy.

## 2010-10-18 NOTE — Progress Notes (Signed)
History of Present Illness: Primary Cardiologist:  Dr.  Shawnie Pons   [name] is a 77 y.o. female presents for evaluation after possible TIA.  She has a history of CAD.  Cardiac catheterization 6/08: EF 55%, 1+ MR, Dx 50%, LAD 50%, dLAD 40%, OM1 50-60% (up to 70%), pOM 30%, pRCA 40%, mRCA 50-60%, pPDA 50-60% (up to 70%).  Medical therapy was recommended.  She has a history of TIAs.  She also has a history of carotid stenosis.  Her records indicate she has a history of atrial flutter documented on an event monitor in the past.  She was admitted to a hospital in [place] and 6/12 with recurrent symptoms of a TIA.  Head CT demonstrated no acute intracranial hemorrhage, nonspecific hypodensity in the midbrain.  Echocardiogram 6/12: Mild to moderate MR, trace AI, trace TR, PASP 34, bubble study negative for intracardiac shunting, normal EF (greater than 55%).  She also has a history of hypertension, diabetes, hyperlipidemia and left bundle branch block.  She has been felt to be a poor candidate for Coumadin.  She has been followed closely by Dr. Riley Kill over the last several weeks.  He has decided to maintain her on dual antiplatelet therapy.  Two days ago, she had a visual field deficit in her left eye.  She states that the bottom half of her vision was absent.  This lasted for 2-3 minutes.  She saw Dr. Elmer Picker, ophthalmologist.  It was felt that she was describing amaurosis fugax.  She denies any facial droop or difficulty with speech.  She denies unilateral weakness.  She feels back to normal.  She denies chest pain, shortness of breath, syncope.  She denies palpitations.  Past Medical History  Diagnosis Date  . COLONIC POLYPS, ADENOMATOUS 12/26/2009  . DIABETES MELLITUS, TYPE II 08/11/2006  . HYPERLIPIDEMIA 09/09/2006  . ANEMIA, NORMOCYTIC 11/30/2009  . HYPERTENSION 09/09/2006  . CAD, NATIVE VESSEL 04/27/2008    Cardiac catheterization 6/08: EF 55%, 1+ MR, Dx 50%, LAD 50%, dLAD 40%, OM1 50-60%  (up to 70%), pOM 30%, pRCA 40%, mRCA 50-60%, pPDA 50-60% (up to 70%).  Medical therapy was recommended  . LEFT BUNDLE BRANCH BLOCK 08/11/2006  . Atrial flutter 04/12/2009  . TIA 09/02/2006    Echocardiogram 6/12: Mild to moderate MR, trace AI, trace TR, PASP 34, bubble study negative for intracardiac shunting, normal EF (greater than 55%)  . HEMORRHOIDS-INTERNAL 12/26/2009  . GERD 02/16/2007  . DIVERTICULOSIS, COLON 08/11/2006  . OSTEOARTHRITIS 08/11/2006  . OSTEOPOROSIS 08/11/2006  . Carotid stenosis     dopplers 6/12: LICA 50-79%    Current Outpatient Prescriptions  Medication Sig Dispense Refill  . aspirin 81 MG tablet Take 81 mg by mouth daily.        . calcium carbonate 200 MG capsule Take 250 mg by mouth 2 (two) times daily with a meal.        . Cholecalciferol (VITAMIN D PO) Take by mouth.        . clopidogrel (PLAVIX) 75 MG tablet Take 1 tablet (75 mg total) by mouth daily.  90 tablet  3  . Cyanocobalamin (VITAMIN B 12 PO) Take by mouth.        . fish oil-omega-3 fatty acids 1000 MG capsule Take 1 g by mouth daily.        . metoprolol tartrate (LOPRESSOR) 25 MG tablet 1/2 tab by mouth twice daily  90 tablet  2  . Multiple Vitamin (MULTIVITAMIN) tablet Take 1 tablet  by mouth daily.        . Multiple Vitamins-Minerals (OCUVITE ADULT 50+ PO) Take by mouth daily.        . pantoprazole (PROTONIX) 40 MG tablet Take 40 mg by mouth. 1/2 tab daily       . simvastatin (ZOCOR) 40 MG tablet Take 1 tablet (40 mg total) by mouth at bedtime.  7 tablet  0  . zolpidem (AMBIEN) 10 MG tablet Take 1 tablet (10 mg total) by mouth at bedtime as needed.  90 tablet  1    Allergies: Allergies  Allergen Reactions  . Doxycycline Hyclate     REACTION: upset stomach  . Nitrofurantoin     REACTION: unspecified  . Sulfamethoxazole W/Trimethoprim     REACTION: unspecified  . Sulfonamide Derivatives     REACTION: rash    Vital Signs: BP 132/54  Pulse 80  Ht 5\' 7"  (1.702 m)  Wt 126 lb (57.153 kg)  BMI  19.73 kg/m2  PHYSICAL EXAM: Well nourished, well developed, in no acute distress HEENT: normal Neck: no JVD Vascular: I cannot appreciate any carotid bruits Cardiac:  normal S1, S2; RRR;  2/6 systolic murmur Along the left sternal border Lungs:  clear to auscultation bilaterally, no wheezing, rhonchi or rales Abd: soft, nontender, no hepatomegaly Ext: no edema Skin: warm and dry Neuro:  CNs 2-12 intact, Bilateral upper and lower extremity strength is normal and equal, rapid alternating movements normal, Romberg negative  EKG:  NSR, HR 61, LBBB  ASSESSMENT AND PLAN:

## 2010-10-18 NOTE — Assessment & Plan Note (Signed)
She seems to be in normal sinus rhythm today.  Check an ECG today.  Continue dual antiplatelet therapy.

## 2010-10-18 NOTE — Assessment & Plan Note (Signed)
Controlled.  

## 2010-10-18 NOTE — Patient Instructions (Signed)
Your physician recommends that you schedule a follow-up appointment in: 1 week with Dr Fredrik Cove have been referred to [facility] for Carotid Stenosis

## 2010-10-18 NOTE — Assessment & Plan Note (Signed)
No anginal symptoms

## 2010-10-18 NOTE — Assessment & Plan Note (Signed)
Refer to vascular surgery as noted above.  Continue dual antiplatelet therapy.

## 2010-10-19 NOTE — Telephone Encounter (Signed)
Dr. Caryl Never notified.

## 2010-10-22 ENCOUNTER — Telehealth: Payer: Self-pay | Admitting: Cardiology

## 2010-10-22 NOTE — Telephone Encounter (Signed)
Pt calling regarding call back regarding a vascular procedure that pt needs to have. Please return pt call to discuss/clarify.

## 2010-10-22 NOTE — Telephone Encounter (Signed)
Patient was referred to vascular surgery for carotid procedure. Patient was made aware that [facility] office will call her with appointment. Patient verbalized understanding.

## 2010-10-26 ENCOUNTER — Telehealth: Payer: Self-pay | Admitting: Cardiology

## 2010-10-26 NOTE — Telephone Encounter (Signed)
9/14--MS Lopes calling about referral to cardiac surgeon--no one has called her and she wants to cancel an appoint with dr Riley Kill on 9/17 as she has not been to surgeon yet--advised i would get referral completed and cancel her appoint for Monday--pt agrees--nt

## 2010-10-26 NOTE — Telephone Encounter (Signed)
Pt has an appt o n Monday, was to see another dr about an additional problem and hasn't been able to get in yet, should she cxl with stuckey until then?

## 2010-10-26 NOTE — Telephone Encounter (Signed)
Please make sure she is set up with [facility] for carotid stenosis and recent amaurosis fugax.  This should have already taken place.  She needs to see them next week. Tereso Newcomer, PA-C

## 2010-10-29 ENCOUNTER — Ambulatory Visit: Payer: Medicare Other | Admitting: Cardiology

## 2010-10-29 ENCOUNTER — Other Ambulatory Visit: Payer: Self-pay

## 2010-10-29 DIAGNOSIS — I6529 Occlusion and stenosis of unspecified carotid artery: Secondary | ICD-10-CM

## 2010-10-30 NOTE — Telephone Encounter (Signed)
This pt is scheduled for carotid and appointment with Dr Arbie Cookey on 11/13/10.

## 2010-10-30 NOTE — Telephone Encounter (Signed)
Dr Riley Kill aware that this pt has appointment with Dr Arbie Cookey on 11/13/10.  Dr Riley Kill reviewed the note from Dr Elmer Picker.

## 2010-11-12 ENCOUNTER — Encounter: Payer: Self-pay | Admitting: Vascular Surgery

## 2010-11-13 ENCOUNTER — Ambulatory Visit (INDEPENDENT_AMBULATORY_CARE_PROVIDER_SITE_OTHER): Payer: Medicare Other | Admitting: Vascular Surgery

## 2010-11-13 ENCOUNTER — Ambulatory Visit (INDEPENDENT_AMBULATORY_CARE_PROVIDER_SITE_OTHER): Payer: Medicare Other | Admitting: *Deleted

## 2010-11-13 ENCOUNTER — Encounter: Payer: Self-pay | Admitting: Vascular Surgery

## 2010-11-13 VITALS — BP 125/58 | HR 71 | Resp 16 | Ht 66.0 in | Wt 127.0 lb

## 2010-11-13 DIAGNOSIS — G459 Transient cerebral ischemic attack, unspecified: Secondary | ICD-10-CM

## 2010-11-13 DIAGNOSIS — I6529 Occlusion and stenosis of unspecified carotid artery: Secondary | ICD-10-CM

## 2010-11-13 NOTE — Progress Notes (Signed)
The patient presents today for evaluation of multiple TIAs. Reports this first occurred 4 years ago with an episode of speech disturbance and right facial weakness. Workup at that time was negative. In June of 2012 she had another event while visiting family in [place]. She had confusion in the medical records that I have reviewed reports some question of left sided weakness. She had an extensive workup which I have for review including carotid duplex suggesting 50-79% left internal carotid artery stenosis. MRI reportedly normal. CT scan normal of her head. She also underwent a transthoracic echocardiogram which was normal. He returned to her baseline neurologic status which is normal and returned home. She did have an episode in September where she was reading the newspaper and had difficulty sitting in her left eye only. This was transient and resolved spontaneously. She is on aspirin and Plavix.  Past Medical History  Diagnosis Date  . COLONIC POLYPS, ADENOMATOUS 12/26/2009  . DIABETES MELLITUS, TYPE II 08/11/2006  . HYPERLIPIDEMIA 09/09/2006  . ANEMIA, NORMOCYTIC 11/30/2009  . HYPERTENSION 09/09/2006  . CAD, NATIVE VESSEL 04/27/2008    Cardiac catheterization 6/08: EF 55%, 1+ MR, Dx 50%, LAD 50%, dLAD 40%, OM1 50-60% (up to 70%), pOM 30%, pRCA 40%, mRCA 50-60%, pPDA 50-60% (up to 70%).  Medical therapy was recommended  . LEFT BUNDLE BRANCH BLOCK 08/11/2006  . Atrial flutter 04/12/2009  . TIA 09/02/2006    Echocardiogram 6/12: Mild to moderate MR, trace AI, trace TR, PASP 34, bubble study negative for intracardiac shunting, normal EF (greater than 55%)  . HEMORRHOIDS-INTERNAL 12/26/2009  . GERD 02/16/2007  . DIVERTICULOSIS, COLON 08/11/2006  . OSTEOARTHRITIS 08/11/2006  . OSTEOPOROSIS 08/11/2006  . Carotid stenosis     dopplers 6/12: LICA 50-79%    History  Substance Use Topics  . Smoking status: Never Smoker   . Smokeless tobacco: Not on file  . Alcohol Use: Yes     1 glass of wine with dinner     Family History  Problem Relation Age of Onset  . Heart disease Mother 56    MI  . Heart disease Father 57    CHF  . Cancer Sister     breast, uterine  . Cancer Brother     colon  . Heart disease Brother     Allergies  Allergen Reactions  . Doxycycline Hyclate     REACTION: upset stomach  . Nitrofurantoin     REACTION: unspecified  . Sulfamethoxazole W/Trimethoprim     REACTION: unspecified  . Sulfonamide Derivatives     REACTION: rash    Current outpatient prescriptions:aspirin 81 MG tablet, Take 81 mg by mouth daily.  , Disp: , Rfl: ;  calcium carbonate 200 MG capsule, Take 250 mg by mouth 2 (two) times daily with a meal.  , Disp: , Rfl: ;  Cholecalciferol (VITAMIN D PO), Take by mouth.  , Disp: , Rfl: ;  clopidogrel (PLAVIX) 75 MG tablet, Take 1 tablet (75 mg total) by mouth daily., Disp: 90 tablet, Rfl: 3;  Cyanocobalamin (VITAMIN B 12 PO), Take by mouth.  , Disp: , Rfl:  fish oil-omega-3 fatty acids 1000 MG capsule, Take 1 g by mouth daily.  , Disp: , Rfl: ;  metoprolol tartrate (LOPRESSOR) 25 MG tablet, 1/2 tab by mouth twice daily, Disp: 90 tablet, Rfl: 2;  Multiple Vitamin (MULTIVITAMIN) tablet, Take 1 tablet by mouth daily.  , Disp: , Rfl: ;  Multiple Vitamins-Minerals (OCUVITE ADULT 50+ PO), Take by mouth daily.  ,  Disp: , Rfl:  pantoprazole (PROTONIX) 40 MG tablet, Take 40 mg by mouth. 1/2 tab daily , Disp: , Rfl: ;  simvastatin (ZOCOR) 40 MG tablet, Take 1 tablet (40 mg total) by mouth at bedtime., Disp: 7 tablet, Rfl: 0;  zolpidem (AMBIEN) 10 MG tablet, Take 1 tablet (10 mg total) by mouth at bedtime as needed., Disp: 90 tablet, Rfl: 1  BP 125/58  Pulse 71  Resp 16  Ht 5\' 6"  (1.676 m)  Wt 127 lb (57.607 kg)  BMI 20.50 kg/m2  Body mass index is 20.50 kg/(m^2).        Review of systems no weight loss or weight gain vascular positive for TIA. Cardiac palpitations. Review of systems otherwise normal.  Physical exam. Well-developed well-nourished white  female appearing younger than his stated age of 11. HEENT normal. Carotid arteries without bruits bilaterally. 2+ radial femoral and dorsalis pedis pulses bilaterally. Heart regular rate and rhythm. Chest clear bilaterally. Abdomen soft nontender no masses noted. Neurologically grossly intact.  Carotid duplex: We repeated her duplex. This showed no hemodynamically significant right internal carotid artery stenosis and mild less than 40% stenosis in her left internal carotid artery.  Impression and plan: Recurrent neurologic deficits with no clear etiology. I do not feel there is any evidence of extracranial cerebrovascular occlusive disease that would explain this. He has had an extensive workup but has not had any evaluation of her arch. I have recommended CT angiogram of her aortic arch and carotids to rule out any potential source. If this is negative I feel that her only option would be aspirin and Plavix as she is doing. We will schedule her outpatient CT scan and see her back in her office for further discussion

## 2010-11-13 NOTE — Progress Notes (Signed)
Addended by: Sharee Pimple on: 11/13/2010 02:56 PM   Modules accepted: Orders

## 2010-11-13 NOTE — Progress Notes (Signed)
Addended by: Sharee Pimple on: 11/13/2010 03:59 PM   Modules accepted: Orders

## 2010-11-15 ENCOUNTER — Other Ambulatory Visit: Payer: Self-pay | Admitting: Vascular Surgery

## 2010-11-15 LAB — BUN: BUN: 19 mg/dL (ref 6–23)

## 2010-11-16 ENCOUNTER — Inpatient Hospital Stay: Admission: RE | Admit: 2010-11-16 | Payer: Medicare Other | Source: Ambulatory Visit

## 2010-11-16 ENCOUNTER — Ambulatory Visit (INDEPENDENT_AMBULATORY_CARE_PROVIDER_SITE_OTHER): Payer: Medicare Other

## 2010-11-16 DIAGNOSIS — Z23 Encounter for immunization: Secondary | ICD-10-CM

## 2010-11-20 ENCOUNTER — Ambulatory Visit
Admission: RE | Admit: 2010-11-20 | Discharge: 2010-11-20 | Disposition: A | Discharge status: Home / Self Care | Payer: Medicare Other | Source: Ambulatory Visit | Attending: Vascular Surgery | Admitting: Vascular Surgery

## 2010-11-20 DIAGNOSIS — G459 Transient cerebral ischemic attack, unspecified: Secondary | ICD-10-CM

## 2010-11-20 DIAGNOSIS — I6529 Occlusion and stenosis of unspecified carotid artery: Secondary | ICD-10-CM

## 2010-11-20 MED ORDER — IOHEXOL 350 MG/ML SOLN
100.0000 mL | Freq: Once | INTRAVENOUS | Status: AC | PRN
  Administered 2010-11-20: 100 mL via INTRAVENOUS

## 2010-11-22 ENCOUNTER — Ambulatory Visit (INDEPENDENT_AMBULATORY_CARE_PROVIDER_SITE_OTHER): Payer: Medicare Other | Admitting: Family Medicine

## 2010-11-22 ENCOUNTER — Encounter: Payer: Self-pay | Admitting: Family Medicine

## 2010-11-22 VITALS — BP 130/58 | Temp 98.1°F | Wt 126.0 lb

## 2010-11-22 DIAGNOSIS — I10 Essential (primary) hypertension: Secondary | ICD-10-CM

## 2010-11-22 DIAGNOSIS — H6990 Unspecified Eustachian tube disorder, unspecified ear: Secondary | ICD-10-CM

## 2010-11-22 DIAGNOSIS — H6980 Other specified disorders of Eustachian tube, unspecified ear: Secondary | ICD-10-CM

## 2010-11-22 DIAGNOSIS — G459 Transient cerebral ischemic attack, unspecified: Secondary | ICD-10-CM

## 2010-11-22 DIAGNOSIS — E119 Type 2 diabetes mellitus without complications: Secondary | ICD-10-CM

## 2010-11-22 DIAGNOSIS — E785 Hyperlipidemia, unspecified: Secondary | ICD-10-CM

## 2010-11-22 LAB — HEMOGLOBIN A1C: Hgb A1c MFr Bld: 6.3 % (ref 4.6–6.5)

## 2010-11-22 NOTE — Progress Notes (Signed)
Subjective:    Patient ID: [name], female    DOB: Jun 16, 1920, 77 y.o.   MRN: [id number]  HPI Medical followup. History of recurrent TIAs. Recent CT angiogram right internal carotid artery lesion 56%. She states she had subsequent study 2 days ago and we cannot locate that in current system. She has not had any recent TIA type symptoms. She has type 2 diabetes which has been managed with diet alone. Last A1c 6.6% last April. Not monitoring blood sugars. No symptoms of hyperglycemia.  She has history of previous atrial flutter and hypertension. Medications reviewed. Compliant with all. Takes simvastatin for hyperlipidemia. Lipids at goal last April. No myalgias. Patient had flu vaccine earlier.  New symptom of left ear fullness past few days. No hearing changes. No vertigo. Denies ear pain or drainage. Minimal nasal congestion. No fever or chills.  Past Medical History  Diagnosis Date  . COLONIC POLYPS, ADENOMATOUS 12/26/2009  . DIABETES MELLITUS, TYPE II 08/11/2006  . HYPERLIPIDEMIA 09/09/2006  . ANEMIA, NORMOCYTIC 11/30/2009  . HYPERTENSION 09/09/2006  . CAD, NATIVE VESSEL 04/27/2008    Cardiac catheterization 6/08: EF 55%, 1+ MR, Dx 50%, LAD 50%, dLAD 40%, OM1 50-60% (up to 70%), pOM 30%, pRCA 40%, mRCA 50-60%, pPDA 50-60% (up to 70%).  Medical therapy was recommended  . LEFT BUNDLE BRANCH BLOCK 08/11/2006  . Atrial flutter 04/12/2009  . TIA 09/02/2006    Echocardiogram 6/12: Mild to moderate MR, trace AI, trace TR, PASP 34, bubble study negative for intracardiac shunting, normal EF (greater than 55%)  . HEMORRHOIDS-INTERNAL 12/26/2009  . GERD 02/16/2007  . DIVERTICULOSIS, COLON 08/11/2006  . OSTEOARTHRITIS 08/11/2006  . OSTEOPOROSIS 08/11/2006  . Carotid stenosis     dopplers 6/12: LICA 50-79%   Past Surgical History  Procedure Date  . Tonsillectomy   . Colon surgery 2001  . Eye surgery     catarac  . Closed reduction metatarsal fracture     right 5th    reports that she has  never smoked. She does not have any smokeless tobacco history on file. She reports that she drinks alcohol. Her drug history not on file. family history includes Cancer in her brother and sister; Heart disease in her brother; Heart disease (age of onset:72) in her mother; and Heart disease (age of onset:93) in her father. Allergies  Allergen Reactions  . Doxycycline Hyclate     REACTION: upset stomach  . Nitrofurantoin     REACTION: unspecified  . Sulfamethoxazole W/Trimethoprim     REACTION: unspecified  . Sulfonamide Derivatives     REACTION: rash      Review of Systems  Constitutional: Negative for fever, chills, appetite change, fatigue and unexpected weight change.  HENT: Negative for hearing loss, ear pain, sore throat, trouble swallowing, tinnitus and ear discharge.   Eyes: Negative for visual disturbance.  Respiratory: Negative for cough and shortness of breath.   Cardiovascular: Negative for chest pain, palpitations and leg swelling.  Gastrointestinal: Negative for abdominal pain.  Neurological: Negative for dizziness, syncope and headaches.  Psychiatric/Behavioral: Negative for confusion and dysphoric mood.       Objective:   Physical Exam  Constitutional: She is oriented to person, place, and time. She appears well-developed and well-nourished. No distress.  HENT:  Right Ear: External ear normal.  Left Ear: External ear normal.  Mouth/Throat: Oropharynx is clear and moist.  Neck: Neck supple.  Cardiovascular: Normal rate and regular rhythm.   Pulmonary/Chest: Effort normal and breath sounds normal. No respiratory  distress. She has no wheezes. She has no rales.  Musculoskeletal: She exhibits no edema.  Lymphadenopathy:    She has no cervical adenopathy.  Neurological: She is alert and oriented to person, place, and time. No cranial nerve deficit.  Psychiatric: She has a normal mood and affect. Her behavior is normal.          Assessment & Plan:  #1 type 2  diabetes. Recheck A1c. #2 history of dyslipidemia. Lipids at goal last April. Continue simvastatin. Recheck at follow up  #3 left ear fullness. Unremarkable exam. Probable eustachian tube dysfunction. Observe for now. #4 history of recurrent TIA. Recently evaluated by [facility]. Has followup there to reassess a couple of weeks. No recent recurrent TIA symptoms.  Remains on Plavix.

## 2010-11-23 NOTE — Progress Notes (Signed)
Quick Note:  Pt informed on VM ______ 

## 2010-11-26 ENCOUNTER — Encounter: Payer: Self-pay | Admitting: Vascular Surgery

## 2010-11-27 ENCOUNTER — Encounter: Payer: Self-pay | Admitting: Vascular Surgery

## 2010-11-27 ENCOUNTER — Ambulatory Visit: Payer: Medicare Other | Admitting: Vascular Surgery

## 2010-11-27 ENCOUNTER — Ambulatory Visit (INDEPENDENT_AMBULATORY_CARE_PROVIDER_SITE_OTHER): Payer: Medicare Other | Admitting: Vascular Surgery

## 2010-11-27 VITALS — BP 124/68 | HR 61 | Resp 16 | Ht 66.0 in | Wt 124.0 lb

## 2010-11-27 DIAGNOSIS — I6529 Occlusion and stenosis of unspecified carotid artery: Secondary | ICD-10-CM

## 2010-11-27 LAB — I-STAT 8, (EC8 V) (CONVERTED LAB)
Acid-Base Excess: 1
BUN: 26 — ABNORMAL HIGH
Bicarbonate: 26.7 — ABNORMAL HIGH
Chloride: 105
Glucose, Bld: 134 — ABNORMAL HIGH
HCT: 40
Hemoglobin: 13.6
Operator id: 282201
Potassium: 4.8
Sodium: 138
TCO2: 28
pCO2, Ven: 47.7
pH, Ven: 7.356 — ABNORMAL HIGH

## 2010-11-27 LAB — HEMOGLOBIN A1C: Hgb A1c MFr Bld: 6.5 — ABNORMAL HIGH

## 2010-11-27 LAB — DIFFERENTIAL
Basophils Absolute: 0
Eosinophils Relative: 1
Lymphocytes Relative: 28
Lymphs Abs: 2.1
Neutro Abs: 5

## 2010-11-27 LAB — APTT: aPTT: 26

## 2010-11-27 LAB — CBC
HCT: 37
Platelets: 248
RDW: 13.4
WBC: 7.6

## 2010-11-27 LAB — LIPID PANEL
HDL: 46
LDL Cholesterol: 80
Triglycerides: 84
VLDL: 17

## 2010-11-27 LAB — PROTIME-INR
INR: 1
Prothrombin Time: 13.1

## 2010-11-27 LAB — URINALYSIS, ROUTINE W REFLEX MICROSCOPIC
Glucose, UA: NEGATIVE
Ketones, ur: NEGATIVE
Protein, ur: NEGATIVE
Urobilinogen, UA: 0.2

## 2010-11-27 LAB — URINE MICROSCOPIC-ADD ON

## 2010-11-27 LAB — POCT I-STAT CREATININE
Creatinine, Ser: 1.1
Operator id: 282201

## 2010-11-27 LAB — HOMOCYSTEINE: Homocysteine: 10.3

## 2010-11-27 NOTE — Progress Notes (Signed)
The patient presents today for continued discussion of her extracranial cerebrovascular occlusive disease. She has had 2 episodes of left eye visual changes 1 in June and 1 in September of this year. She had a partial visual field loss and was felt to have amaurosis fugax when seeing her ophthalmologist. She had undergone a carotid duplex suggesting moderate stenosis. He has subsequently undergone a CT angiogram of her neck and is here today for further discussion of this.  Past Medical History  Diagnosis Date  . COLONIC POLYPS, ADENOMATOUS 12/26/2009  . DIABETES MELLITUS, TYPE II 08/11/2006  . HYPERLIPIDEMIA 09/09/2006  . ANEMIA, NORMOCYTIC 11/30/2009  . HYPERTENSION 09/09/2006  . CAD, NATIVE VESSEL 04/27/2008    Cardiac catheterization 6/08: EF 55%, 1+ MR, Dx 50%, LAD 50%, dLAD 40%, OM1 50-60% (up to 70%), pOM 30%, pRCA 40%, mRCA 50-60%, pPDA 50-60% (up to 70%).  Medical therapy was recommended  . LEFT BUNDLE BRANCH BLOCK 08/11/2006  . Atrial flutter 04/12/2009  . TIA 09/02/2006    Echocardiogram 6/12: Mild to moderate MR, trace AI, trace TR, PASP 34, bubble study negative for intracardiac shunting, normal EF (greater than 55%)  . HEMORRHOIDS-INTERNAL 12/26/2009  . GERD 02/16/2007  . DIVERTICULOSIS, COLON 08/11/2006  . OSTEOARTHRITIS 08/11/2006  . OSTEOPOROSIS 08/11/2006  . Carotid stenosis     dopplers 6/12: LICA 50-79%    History  Substance Use Topics  . Smoking status: Never Smoker   . Smokeless tobacco: Not on file  . Alcohol Use: Yes     1 glass of wine with dinner    Family History  Problem Relation Age of Onset  . Heart disease Mother 73    MI  . Heart disease Father 13    CHF  . Cancer Sister     breast, uterine  . Cancer Brother     colon  . Heart disease Brother     Allergies  Allergen Reactions  . Doxycycline Hyclate     REACTION: upset stomach  . Nitrofurantoin     REACTION: unspecified  . Sulfamethoxazole W/Trimethoprim     REACTION: unspecified  . Sulfonamide  Derivatives     REACTION: rash    Current outpatient prescriptions:aspirin 81 MG tablet, Take 81 mg by mouth daily.  , Disp: , Rfl: ;  calcium carbonate 200 MG capsule, Take 250 mg by mouth 2 (two) times daily with a meal.  , Disp: , Rfl: ;  Cholecalciferol (VITAMIN D PO), Take by mouth.  , Disp: , Rfl: ;  clopidogrel (PLAVIX) 75 MG tablet, Take 1 tablet (75 mg total) by mouth daily., Disp: 90 tablet, Rfl: 3;  Cyanocobalamin (VITAMIN B 12 PO), Take by mouth.  , Disp: , Rfl:  fish oil-omega-3 fatty acids 1000 MG capsule, Take 1 g by mouth daily.  , Disp: , Rfl: ;  metoprolol tartrate (LOPRESSOR) 25 MG tablet, 1/2 tab by mouth twice daily, Disp: 90 tablet, Rfl: 2;  Multiple Vitamin (MULTIVITAMIN) tablet, Take 1 tablet by mouth daily.  , Disp: , Rfl: ;  Multiple Vitamins-Minerals (OCUVITE ADULT 50+ PO), Take by mouth daily.  , Disp: , Rfl:  pantoprazole (PROTONIX) 40 MG tablet, Take 40 mg by mouth. 1/2 tab daily , Disp: , Rfl: ;  simvastatin (ZOCOR) 40 MG tablet, Take 1 tablet (40 mg total) by mouth at bedtime., Disp: 7 tablet, Rfl: 0;  zolpidem (AMBIEN) 10 MG tablet, Take 1 tablet (10 mg total) by mouth at bedtime as needed., Disp: 90 tablet, Rfl: 1  BP  124/68  Pulse 61  Resp 16  Ht 5\' 6"  (1.676 m)  Wt 124 lb (56.246 kg)  BMI 20.01 kg/m2  SpO2 98%  Body mass index is 20.01 kg/(m^2).       Physical exam a well-developed thin white female appearing younger than the stated age of 33. HEENT normal. Chest clear bilateral. Heart regular rate and rhythm without murmur. Grossly intact neurologically. 2+ radial pulses bilaterally. Extremities no major deformities.  CT angiogram: 60% stenosis in her left internal carotid artery. Since extremely irregular and ulcerated with a severe calcific plaque.  Impression and plan: Left eye amaurosis fugax. Negative cardiac workup. CT angiogram showing no aortic arch or proximal disease. irregular ulcerative left bifurcation carotid plaque with 60% stenosis. I  discussed this at length with the patient. I explained that her carotid stenosis was the most likely etiology of her visual symptoms. I have recommended carotid endarterectomy for reduction of risk of left eye blindness or stroke. He understands expected one-day hospitalization and 1-2% risk of stroke with the procedure. Also discussed potential risk of cranial nerve injury. She understands and wished to proceed with this after discussing further with her family.

## 2010-11-28 LAB — DIFFERENTIAL
Basophils Absolute: 0
Basophils Relative: 0
Eosinophils Absolute: 0
Eosinophils Relative: 0
Lymphocytes Relative: 25
Lymphs Abs: 2
Monocytes Absolute: 0.5
Monocytes Relative: 6
Neutro Abs: 5.6
Neutrophils Relative %: 69

## 2010-11-28 LAB — PROTIME-INR
INR: 1
Prothrombin Time: 13.4

## 2010-11-28 LAB — CBC
HCT: 33.4 — ABNORMAL LOW
HCT: 37.1
Hemoglobin: 11.2 — ABNORMAL LOW
Hemoglobin: 12.5
MCHC: 33.5
MCHC: 33.6
MCV: 96.7
MCV: 98.2
Platelets: 187
Platelets: 212
RBC: 3.4 — ABNORMAL LOW
RBC: 3.84 — ABNORMAL LOW
RDW: 13.4
RDW: 13.4
WBC: 8.2
WBC: 8.3

## 2010-11-28 LAB — HEPATIC FUNCTION PANEL
ALT: 24
AST: 28
Albumin: 3.9
Alkaline Phosphatase: 41
Bilirubin, Direct: <0.1
Total Bilirubin: 0.7
Total Protein: 7.4

## 2010-11-28 LAB — BASIC METABOLIC PANEL
BUN: 14
BUN: 18
CO2: 26
CO2: 27
Calcium: 8.6
Calcium: 9.5
Chloride: 104
Chloride: 108
Creatinine, Ser: 0.84
Creatinine, Ser: 0.93
GFR calc Af Amer: >60
GFR calc Af Amer: >60
GFR calc non Af Amer: 57 — ABNORMAL LOW
GFR calc non Af Amer: >60
Glucose, Bld: 125 — ABNORMAL HIGH
Glucose, Bld: 132 — ABNORMAL HIGH
Potassium: 4.3
Potassium: 4.4
Sodium: 138
Sodium: 141

## 2010-11-28 LAB — D-DIMER, QUANTITATIVE (NOT AT ARMC): D-Dimer, Quant: 0.3

## 2010-11-28 LAB — APTT
aPTT: 26
aPTT: 28

## 2010-11-28 LAB — CARDIAC PANEL(CRET KIN+CKTOT+MB+TROPI)
CK, MB: 2.1
CK, MB: 2.5
CK, MB: 2.5
Relative Index: 2.4
Relative Index: INVALID
Relative Index: INVALID
Total CK: 104
Total CK: 89
Total CK: 94
Troponin I: 0.02
Troponin I: 0.03
Troponin I: 0.04

## 2010-11-28 NOTE — Procedures (Unsigned)
CAROTID DUPLEX EXAM  INDICATION:  Carotid stenosis.  HISTORY: Diabetes:  No. Cardiac:  No. Hypertension:  No. Smoking:  No. Previous Surgery:  No. CV History:  Currently asymptomatic. Amaurosis Fugax No, Paresthesias No, Hemiparesis No.                                      RIGHT             LEFT Brachial systolic pressure:         118               120 Brachial Doppler waveforms:         Normal            Normal Vertebral direction of flow:        Antegrade         Antegrade DUPLEX VELOCITIES (cm/sec) CCA peak systolic                   74                76 ECA peak systolic                   67                92 ICA peak systolic                   75                114 ICA end diastolic                   21                29 PLAQUE MORPHOLOGY:                  Heterogenous      Heterogenous PLAQUE AMOUNT:                      Mild              Mild PLAQUE LOCATION:                    ECA               ICA, ECA  IMPRESSION: 1. No hemodynamically significant stenosis of the right internal     carotid artery. 2. Left internal carotid artery velocities suggest 1% to 39% stenosis. 3. Antegrade vertebral arteries bilaterally.  ___________________________________________ Larina Earthly, M.D.  EM/MEDQ  D:  11/13/2010  T:  11/13/2010  Job:  [id number]

## 2010-11-29 ENCOUNTER — Other Ambulatory Visit (HOSPITAL_COMMUNITY): Payer: Medicare Other

## 2010-11-30 ENCOUNTER — Encounter (HOSPITAL_COMMUNITY)
Admission: RE | Admit: 2010-11-30 | Discharge: 2010-11-30 | Disposition: A | Discharge status: Home / Self Care | Payer: Medicare Other | Source: Ambulatory Visit | Attending: Vascular Surgery | Admitting: Vascular Surgery

## 2010-11-30 ENCOUNTER — Ambulatory Visit (HOSPITAL_COMMUNITY)
Admission: RE | Admit: 2010-11-30 | Discharge: 2010-11-30 | Disposition: A | Discharge status: Home / Self Care | Payer: Medicare Other | Source: Ambulatory Visit | Attending: Vascular Surgery | Admitting: Vascular Surgery

## 2010-11-30 ENCOUNTER — Other Ambulatory Visit: Payer: Self-pay | Admitting: Vascular Surgery

## 2010-11-30 DIAGNOSIS — Z01812 Encounter for preprocedural laboratory examination: Secondary | ICD-10-CM | POA: Insufficient documentation

## 2010-11-30 DIAGNOSIS — Z01810 Encounter for preprocedural cardiovascular examination: Secondary | ICD-10-CM | POA: Insufficient documentation

## 2010-11-30 DIAGNOSIS — I6522 Occlusion and stenosis of left carotid artery: Secondary | ICD-10-CM

## 2010-11-30 DIAGNOSIS — I447 Left bundle-branch block, unspecified: Secondary | ICD-10-CM | POA: Insufficient documentation

## 2010-11-30 DIAGNOSIS — Z01818 Encounter for other preprocedural examination: Secondary | ICD-10-CM | POA: Insufficient documentation

## 2010-11-30 LAB — URINALYSIS, ROUTINE W REFLEX MICROSCOPIC
Hgb urine dipstick: NEGATIVE
Nitrite: NEGATIVE
Specific Gravity, Urine: 1.017 (ref 1.005–1.030)
Urobilinogen, UA: 0.2 mg/dL (ref 0.0–1.0)
pH: 5.5 (ref 5.0–8.0)

## 2010-11-30 LAB — PROTIME-INR: Prothrombin Time: 13.3 seconds (ref 11.6–15.2)

## 2010-11-30 LAB — COMPREHENSIVE METABOLIC PANEL
CO2: 27 mEq/L (ref 19–32)
Calcium: 9.8 mg/dL (ref 8.4–10.5)
Creatinine, Ser: 1.01 mg/dL (ref 0.50–1.10)
GFR calc Af Amer: 56 mL/min — ABNORMAL LOW (ref >90)
GFR calc non Af Amer: 48 mL/min — ABNORMAL LOW (ref >90)
Glucose, Bld: 120 mg/dL — ABNORMAL HIGH (ref 70–99)
Total Protein: 7.1 g/dL (ref 6.0–8.3)

## 2010-11-30 LAB — DIFFERENTIAL
Basophils Absolute: 0 10*3/uL (ref 0.0–0.1)
Basophils Relative: 0 % (ref 0–1)
Monocytes Absolute: 0.7 10*3/uL (ref 0.1–1.0)
Neutro Abs: 3.8 10*3/uL (ref 1.7–7.7)

## 2010-11-30 LAB — URINE MICROSCOPIC-ADD ON

## 2010-11-30 LAB — CBC
HCT: 36.7 % (ref 36.0–46.0)
Hemoglobin: 12.3 g/dL (ref 12.0–15.0)
MCHC: 33.5 g/dL (ref 30.0–36.0)
MCV: 97.3 fL (ref 78.0–100.0)
RDW: 13.4 % (ref 11.5–15.5)

## 2010-11-30 LAB — TYPE AND SCREEN
ABO/RH(D): A POS
Antibody Screen: NEGATIVE

## 2010-12-05 ENCOUNTER — Other Ambulatory Visit: Payer: Self-pay | Admitting: Vascular Surgery

## 2010-12-05 ENCOUNTER — Inpatient Hospital Stay (HOSPITAL_COMMUNITY)
Admission: RE | Admit: 2010-12-05 | Discharge: 2010-12-06 | DRG: 039 | Disposition: A | Discharge status: Home / Self Care | Payer: Medicare Other | Source: Ambulatory Visit | Attending: Vascular Surgery | Admitting: Vascular Surgery

## 2010-12-05 DIAGNOSIS — Z7982 Long term (current) use of aspirin: Secondary | ICD-10-CM

## 2010-12-05 DIAGNOSIS — I10 Essential (primary) hypertension: Secondary | ICD-10-CM | POA: Diagnosis present

## 2010-12-05 DIAGNOSIS — M810 Age-related osteoporosis without current pathological fracture: Secondary | ICD-10-CM | POA: Diagnosis present

## 2010-12-05 DIAGNOSIS — Z8673 Personal history of transient ischemic attack (TIA), and cerebral infarction without residual deficits: Secondary | ICD-10-CM

## 2010-12-05 DIAGNOSIS — E119 Type 2 diabetes mellitus without complications: Secondary | ICD-10-CM | POA: Diagnosis present

## 2010-12-05 DIAGNOSIS — I6529 Occlusion and stenosis of unspecified carotid artery: Secondary | ICD-10-CM

## 2010-12-05 DIAGNOSIS — Z860100 Personal history of colon polyps, unspecified: Secondary | ICD-10-CM

## 2010-12-05 DIAGNOSIS — Z79899 Other long term (current) drug therapy: Secondary | ICD-10-CM

## 2010-12-05 DIAGNOSIS — K5730 Diverticulosis of large intestine without perforation or abscess without bleeding: Secondary | ICD-10-CM | POA: Diagnosis present

## 2010-12-05 DIAGNOSIS — E785 Hyperlipidemia, unspecified: Secondary | ICD-10-CM | POA: Diagnosis present

## 2010-12-05 DIAGNOSIS — I059 Rheumatic mitral valve disease, unspecified: Secondary | ICD-10-CM | POA: Diagnosis present

## 2010-12-05 DIAGNOSIS — K219 Gastro-esophageal reflux disease without esophagitis: Secondary | ICD-10-CM | POA: Diagnosis present

## 2010-12-05 DIAGNOSIS — I2510 Atherosclerotic heart disease of native coronary artery without angina pectoris: Secondary | ICD-10-CM | POA: Diagnosis present

## 2010-12-05 DIAGNOSIS — I447 Left bundle-branch block, unspecified: Secondary | ICD-10-CM | POA: Diagnosis present

## 2010-12-05 DIAGNOSIS — Z7902 Long term (current) use of antithrombotics/antiplatelets: Secondary | ICD-10-CM

## 2010-12-05 HISTORY — PX: CAROTID ENDARTERECTOMY: SUR193

## 2010-12-05 LAB — GLUCOSE, CAPILLARY
Glucose-Capillary: 131 mg/dL — ABNORMAL HIGH (ref 70–99)
Glucose-Capillary: 182 mg/dL — ABNORMAL HIGH (ref 70–99)

## 2010-12-06 LAB — GLUCOSE, CAPILLARY: Glucose-Capillary: 190 mg/dL — ABNORMAL HIGH (ref 70–99)

## 2010-12-06 LAB — CBC
Hemoglobin: 9.2 g/dL — ABNORMAL LOW (ref 12.0–15.0)
MCH: 32.2 pg (ref 26.0–34.0)
MCHC: 33.1 g/dL (ref 30.0–36.0)
Platelets: 149 10*3/uL — ABNORMAL LOW (ref 150–400)
RDW: 13.6 % (ref 11.5–15.5)

## 2010-12-06 LAB — BASIC METABOLIC PANEL
Calcium: 8.4 mg/dL (ref 8.4–10.5)
GFR calc non Af Amer: 57 mL/min — ABNORMAL LOW (ref >90)
Glucose, Bld: 178 mg/dL — ABNORMAL HIGH (ref 70–99)
Sodium: 138 mEq/L (ref 135–145)

## 2010-12-07 ENCOUNTER — Encounter: Payer: Self-pay | Admitting: Internal Medicine

## 2010-12-12 NOTE — Op Note (Signed)
[name], [name]                ACCOUNT NO.:  [id number]  MEDICAL RECORD NO.:  [id number]  LOCATION:  3310                         FACILITY:  [facility]  PHYSICIAN:  Larina Earthly, M.D.    DATE OF BIRTH:  March 14, 1920  DATE OF PROCEDURE:  12/05/2010 DATE OF DISCHARGE:                              OPERATIVE REPORT   PREOPERATIVE DIAGNOSIS:  Symptomatic left internal carotid artery stenosis.  POSTOPERATIVE DIAGNOSIS:  Symptomatic left internal carotid artery stenosis.  PROCEDURE:  Left carotid endarterectomy and Dacron patch angioplasty.  SURGEON:  Larina Earthly, MD  ASSISTANT:  Della Goo, PA-C  ANESTHESIA:  General endotracheal.  COMPLICATIONS:  None.  DISPOSITION:  To recovery room, neurologically intact.  PROCEDURE IN DETAIL:  The patient was taken to the operating room and placed in supine where the area of the left neck was prepped and draped in usual sterile fashion.  Incision was made at anterior sternocleidomastoid and carried down through the platysma with electrocautery.  The sternocleidomastoid was reflected posteriorly and the carotid sheath was opened.  Facial vein was ligated with 2-0 silk ties and divided.  Dissection was extended onto the bifurcation.  The common carotid artery was encircled with an umbilical tape and Rumel tourniquet.  The external carotid was encircled with a blue vessel loop and the internal carotid was encircled with an umbilical tape and Rumel tourniquet.  The superior thyroid artery was encircled with 2-0 silk Potts tie.  The patient was given 6000 units of intravenous heparin. After the adequate circulation time, the internal, external and common carotid arteries were occluded.  The common carotid artery was opened with an 11-blade and extended longitudinally with Potts scissors through the plaque onto the internal carotid artery.  A 10 shunt was passed up the internal carotid, allow to backbled and down the common carotid artery  were secured with Rumel tourniquets.  The endarterectomy was begun on the common carotid artery and the plaque was divided proximally with Potts scissors.  The endarterectomy was carried onto the bifurcation.  The external carotid was endarterectomized with eversion technique and the internal carotid was endarterectomized in an open fashion.  Remaining atheromatous debris was removed from the endarterectomy plane.  A Finesse Hemashield Dacron patch was brought onto the field and sewn as a patch angioplasty with a running 6-0 Prolene suture.  Prior to completion of the anastomosis, the shunt was removed and the usual flushing maneuvers were undertaken.  The anastomosis was completed and flow was restored first to the external and then the internal carotid arteries.  Excellent flow characteristics were noted with handheld Doppler in the internal and external carotid arteries.  The patient was given 50 mg of protamine to reverse the heparin.  Wounds were irrigated with saline.  Hemostasis with electrocautery.  Wounds were closed with 3-0 Vicryl sutures, reapproximated the sternocleidomastoid over the carotid sheath.  Next, the platysma was closed with running 3-0 Vicryl suture and finally the skin was closed with a 4-0 subcuticular Vicryl stitch.  Sterile dressing was applied, and the patient was taken to the recovery room in stable condition.     Larina Earthly, M.D.     TFE/MEDQ  D:  12/05/2010  T:  12/05/2010  Job:  [id number]  Electronically Signed by Sy Saintjean M.D. on 12/12/2010 01:17:39 PM

## 2010-12-13 ENCOUNTER — Telehealth: Payer: Self-pay | Admitting: *Deleted

## 2010-12-13 NOTE — Telephone Encounter (Signed)
I would not worry how low her diastolic goes as long as systolic >100 and she is not dizzy. Agree with good hydration and follow.

## 2010-12-13 NOTE — Telephone Encounter (Signed)
Notified pt. 

## 2010-12-13 NOTE — Telephone Encounter (Signed)
BPs have been fluctuating from as low as 77 diastolic/???.  Had carotid surgery this week.  Advised to push the fluids, and we will get suggestions from Dr. Caryl Never.  Last BP was 126/60.

## 2010-12-13 NOTE — Telephone Encounter (Signed)
BP today :  126/60, 98/50, 118/70

## 2010-12-17 ENCOUNTER — Telehealth: Payer: Self-pay | Admitting: Family Medicine

## 2010-12-17 ENCOUNTER — Encounter: Payer: Self-pay | Admitting: Vascular Surgery

## 2010-12-17 MED ORDER — PANTOPRAZOLE SODIUM 40 MG PO TBEC: DELAYED_RELEASE_TABLET | ORAL | Status: DC

## 2010-12-17 NOTE — Telephone Encounter (Signed)
Pt requesting refill on pantoprazole (PROTONIX) 40 MG tablet   Please send to [facility]

## 2010-12-18 ENCOUNTER — Encounter: Payer: Self-pay | Admitting: Vascular Surgery

## 2010-12-18 ENCOUNTER — Ambulatory Visit (INDEPENDENT_AMBULATORY_CARE_PROVIDER_SITE_OTHER): Payer: Medicare Other | Admitting: Vascular Surgery

## 2010-12-18 VITALS — BP 147/50 | HR 76 | Resp 18 | Ht 66.5 in | Wt 125.0 lb

## 2010-12-18 DIAGNOSIS — I6529 Occlusion and stenosis of unspecified carotid artery: Secondary | ICD-10-CM

## 2010-12-18 NOTE — Progress Notes (Signed)
The patient has today for all of her left carotid endarterectomy on 12/05/2010. He did well in the hospital discharged home on postoperative day 1. He has continued to do well since her discharge. She does have the usual amount of peri-incisional numbness. She has had no neurologic deficits. She has no focal vision changes. She does report some blurring of vision bilaterally.  Physical exam well-healed left carotid incision with no bruits bilaterally. Normal radial pulses bilaterally. Grossly intact neurologically.  Impression and plan: Stable status post left carotid endarterectomy. She will continue usual activities we will see her again in 6 months with repeat carotid duplex. She'll notify us if she develops any neurologic deficits.

## 2010-12-19 ENCOUNTER — Other Ambulatory Visit: Payer: Self-pay | Admitting: *Deleted

## 2010-12-19 MED ORDER — PANTOPRAZOLE SODIUM 40 MG PO TBEC: DELAYED_RELEASE_TABLET | ORAL | Status: DC

## 2010-12-25 ENCOUNTER — Encounter: Payer: Self-pay | Admitting: Internal Medicine

## 2010-12-25 ENCOUNTER — Ambulatory Visit (INDEPENDENT_AMBULATORY_CARE_PROVIDER_SITE_OTHER): Payer: Medicare Other | Admitting: Internal Medicine

## 2010-12-25 DIAGNOSIS — I6529 Occlusion and stenosis of unspecified carotid artery: Secondary | ICD-10-CM

## 2010-12-25 DIAGNOSIS — E119 Type 2 diabetes mellitus without complications: Secondary | ICD-10-CM

## 2010-12-25 DIAGNOSIS — R82998 Other abnormal findings in urine: Secondary | ICD-10-CM

## 2010-12-25 LAB — POCT URINALYSIS DIPSTICK
Ketones, UA: NEGATIVE
Protein, UA: NEGATIVE
Spec Grav, UA: 1.015

## 2010-12-25 MED ORDER — CIPROFLOXACIN HCL 500 MG PO TABS: 500.0000 mg | ORAL_TABLET | Freq: Two times a day (BID) | ORAL | Status: AC

## 2010-12-25 NOTE — Patient Instructions (Signed)
Drink as much fluid as you  can tolerate over the next few days  Call or return to clinic prn if these symptoms worsen or fail to improve as anticipated.  

## 2010-12-25 NOTE — Progress Notes (Signed)
  Subjective:    Patient ID: [name], female    DOB: May 16, 1920, 77 y.o.   MRN: [id number]  HPI  77 year old patient who is approximately 3 weeks status post left carotid endarterectomy. For the past several days she has noted intermittent dark urine with the foul smell. The quality of the urine as far as color and smell is intermittent. She denies any urinary dysuria urgency or frequency. She will be leaving for a trip to [place] and was concerned about a UTI. No focal neurological symptoms    Review of Systems  Genitourinary: Negative for dysuria, urgency, frequency, hematuria, flank pain, decreased urine volume and difficulty urinating.       Objective:   Physical Exam  Constitutional: She appears well-developed and well-nourished. No distress.  Neck:       Nicely healing left carotid endarterectomy scar  Abdominal: Soft. Bowel sounds are normal.          Assessment & Plan:   Urinalysis reviewed and was unremarkable. No evidence of a UTI. Patient has not changed her diet nor is she on any new medications. She was reassured Status post left CEA

## 2011-02-01 ENCOUNTER — Emergency Department (HOSPITAL_COMMUNITY)
Admission: EM | Admit: 2011-02-01 | Discharge: 2011-02-01 | Disposition: A | Discharge status: Home / Self Care | Payer: Medicare Other | Attending: Emergency Medicine | Admitting: Emergency Medicine

## 2011-02-01 ENCOUNTER — Encounter (HOSPITAL_COMMUNITY): Payer: Self-pay | Admitting: *Deleted

## 2011-02-01 DIAGNOSIS — Z7982 Long term (current) use of aspirin: Secondary | ICD-10-CM | POA: Insufficient documentation

## 2011-02-01 DIAGNOSIS — Z79899 Other long term (current) drug therapy: Secondary | ICD-10-CM | POA: Insufficient documentation

## 2011-02-01 DIAGNOSIS — I10 Essential (primary) hypertension: Secondary | ICD-10-CM | POA: Insufficient documentation

## 2011-02-01 DIAGNOSIS — S0180XA Unspecified open wound of other part of head, initial encounter: Secondary | ICD-10-CM | POA: Insufficient documentation

## 2011-02-01 DIAGNOSIS — S0181XA Laceration without foreign body of other part of head, initial encounter: Secondary | ICD-10-CM

## 2011-02-01 DIAGNOSIS — I2510 Atherosclerotic heart disease of native coronary artery without angina pectoris: Secondary | ICD-10-CM | POA: Insufficient documentation

## 2011-02-01 DIAGNOSIS — S1093XA Contusion of unspecified part of neck, initial encounter: Secondary | ICD-10-CM | POA: Insufficient documentation

## 2011-02-01 DIAGNOSIS — S0083XA Contusion of other part of head, initial encounter: Secondary | ICD-10-CM

## 2011-02-01 DIAGNOSIS — M1990 Unspecified osteoarthritis, unspecified site: Secondary | ICD-10-CM | POA: Insufficient documentation

## 2011-02-01 DIAGNOSIS — W1809XA Striking against other object with subsequent fall, initial encounter: Secondary | ICD-10-CM | POA: Insufficient documentation

## 2011-02-01 DIAGNOSIS — K219 Gastro-esophageal reflux disease without esophagitis: Secondary | ICD-10-CM | POA: Insufficient documentation

## 2011-02-01 DIAGNOSIS — S0003XA Contusion of scalp, initial encounter: Secondary | ICD-10-CM | POA: Insufficient documentation

## 2011-02-01 DIAGNOSIS — S025XXA Fracture of tooth (traumatic), initial encounter for closed fracture: Secondary | ICD-10-CM | POA: Insufficient documentation

## 2011-02-01 DIAGNOSIS — Z8673 Personal history of transient ischemic attack (TIA), and cerebral infarction without residual deficits: Secondary | ICD-10-CM | POA: Insufficient documentation

## 2011-02-01 DIAGNOSIS — M810 Age-related osteoporosis without current pathological fracture: Secondary | ICD-10-CM | POA: Insufficient documentation

## 2011-02-01 DIAGNOSIS — E785 Hyperlipidemia, unspecified: Secondary | ICD-10-CM | POA: Insufficient documentation

## 2011-02-01 DIAGNOSIS — E119 Type 2 diabetes mellitus without complications: Secondary | ICD-10-CM | POA: Insufficient documentation

## 2011-02-01 DIAGNOSIS — R6884 Jaw pain: Secondary | ICD-10-CM | POA: Insufficient documentation

## 2011-02-01 DIAGNOSIS — IMO0002 Reserved for concepts with insufficient information to code with codable children: Secondary | ICD-10-CM | POA: Insufficient documentation

## 2011-02-01 MED ORDER — HYDROCODONE-ACETAMINOPHEN 5-325 MG PO TABS
2.0000 | ORAL_TABLET | Freq: Once | ORAL | Status: AC
  Administered 2011-02-01: 2 via ORAL
  Filled 2011-02-01: qty 2

## 2011-02-01 MED ORDER — TETANUS-DIPHTHERIA TOXOIDS TD 5-2 LFU IM INJ
0.5000 mL | INJECTION | Freq: Once | INTRAMUSCULAR | Status: AC
  Administered 2011-02-01: 0.5 mL via INTRAMUSCULAR
  Filled 2011-02-01: qty 0.5

## 2011-02-01 MED ORDER — HYDROCODONE-ACETAMINOPHEN 5-325 MG PO TABS: 2.0000 | ORAL_TABLET | ORAL | Status: AC | PRN

## 2011-02-01 NOTE — ED Notes (Signed)
To ed for eval after tripping and falling. No dizziness, no loc. Pt states she has a loose tooth past falling.

## 2011-02-01 NOTE — ED Provider Notes (Signed)
History     CSN: [id number]  Arrival date & time 02/01/11  1613   First MD Initiated Contact with Patient 02/01/11 1939      Chief Complaint  Patient presents with  . Fall    (Consider location/radiation/quality/duration/timing/severity/associated sxs/prior treatment) HPI Comments: The patient is an 77 year old female presents to the emergency department after she had a trip and fall while walking. She fell forward and hit her chin on the concrete. She has a small U-shaped laceration, 1 cm, at the underside of the anterior chin. She has bruising surrounding this area. She also reports that she chipped a tooth, her right lower first molar, which has apparent previous dental caries and fillings. The medial fragment appears chipped off. She denies any headache, loss of consciousness, neck pain, change in hearing, change in vision, or malocclusion of the jaw. She just reports pain with opening and closing the jaw but has normal alignment of the upper and lower jaw and dentition other than the right lower first molar which occludes normally but has an apparent chipped fragment. She denies any other injuries.  Patient is a 77 y.o. female presenting with fall. The history is provided by the patient.  Fall The accident occurred 3 to 5 hours ago. The fall occurred while walking. She fell from a height of 1 to 2 ft. She landed on concrete. The volume of blood lost was minimal. Point of impact: Chin. Pain location: Chin and bilateral temporomandibular joints. The pain is mild. She was ambulatory at the scene. There was no entrapment after the fall. There was no drug use involved in the accident. There was no alcohol use involved in the accident. Pertinent negatives include no visual change, no numbness, no abdominal pain, no nausea, no vomiting, no headaches, no loss of consciousness and no tingling. Exacerbated by: Palpation of the chin, opening and closing the mouth. Prehospitalization: Nothing. She has  tried nothing for the symptoms.    Past Medical History  Diagnosis Date  . COLONIC POLYPS, ADENOMATOUS 12/26/2009  . DIABETES MELLITUS, TYPE II 08/11/2006  . HYPERLIPIDEMIA 09/09/2006  . ANEMIA, NORMOCYTIC 11/30/2009  . HYPERTENSION 09/09/2006  . CAD, NATIVE VESSEL 04/27/2008    Cardiac catheterization 6/08: EF 55%, 1+ MR, Dx 50%, LAD 50%, dLAD 40%, OM1 50-60% (up to 70%), pOM 30%, pRCA 40%, mRCA 50-60%, pPDA 50-60% (up to 70%).  Medical therapy was recommended  . LEFT BUNDLE BRANCH BLOCK 08/11/2006  . Atrial flutter 04/12/2009  . TIA 09/02/2006    Echocardiogram 6/12: Mild to moderate MR, trace AI, trace TR, PASP 34, bubble study negative for intracardiac shunting, normal EF (greater than 55%)  . HEMORRHOIDS-INTERNAL 12/26/2009  . GERD 02/16/2007  . DIVERTICULOSIS, COLON 08/11/2006  . OSTEOARTHRITIS 08/11/2006  . OSTEOPOROSIS 08/11/2006  . Carotid stenosis     dopplers 6/12: LICA 50-79%    Past Surgical History  Procedure Date  . Tonsillectomy   . Colon surgery 2001  . Eye surgery     catarac  . Closed reduction metatarsal fracture     right 5th    Family History  Problem Relation Age of Onset  . Heart disease Mother 51    MI  . Heart disease Father 76    CHF  . Cancer Sister     breast, uterine  . Cancer Brother     colon  . Heart disease Brother     History  Substance Use Topics  . Smoking status: Never Smoker   . Smokeless tobacco:  Never Used  . Alcohol Use: Yes     1 glass of wine with dinner    OB History    Grav Para Term Preterm Abortions TAB SAB Ect Mult Living                  Review of Systems  Constitutional: Negative for fatigue.  HENT: Negative for nosebleeds, rhinorrhea, neck pain, neck stiffness, postnasal drip and tinnitus.   Eyes: Negative.   Respiratory: Negative.   Cardiovascular: Negative.   Gastrointestinal: Negative.  Negative for nausea, vomiting and abdominal pain.  Musculoskeletal: Negative for back pain, joint swelling and  arthralgias.  Skin: Positive for wound. Negative for color change, pallor and rash.  Neurological: Negative for tingling, loss of consciousness, syncope, numbness and headaches.  Psychiatric/Behavioral: Negative for confusion.    Allergies  Doxycycline hyclate; Nitrofurantoin; Sulfamethoxazole w/trimethoprim; and Sulfonamide derivatives  Home Medications   Current Outpatient Rx  Name Route Sig Dispense Refill  . ASPIRIN 81 MG PO TABS Oral Take 81 mg by mouth daily.      Marland Kitchen CALCIUM CARBONATE 200 MG PO CAPS Oral Take 250 mg by mouth 2 (two) times daily with a meal.      . VITAMIN D PO Oral Take 1 tablet by mouth daily.     Marland Kitchen CLOPIDOGREL BISULFATE 75 MG PO TABS Oral Take 75 mg by mouth daily.      Marland Kitchen VITAMIN B 12 PO Oral Take 1 tablet by mouth daily.     . OMEGA-3 FATTY ACIDS 1000 MG PO CAPS Oral Take 1 g by mouth daily.     Marland Kitchen METOPROLOL TARTRATE 25 MG PO TABS Oral Take 12.5 mg by mouth 2 (two) times daily.      Marland Kitchen ONE-DAILY MULTI VITAMINS PO TABS Oral Take 1 tablet by mouth daily.      Idolina Primer ADULT 50+ PO Oral Take by mouth daily.      Marland Kitchen PANTOPRAZOLE SODIUM 40 MG PO TBEC Oral Take 20 mg by mouth every other day.      Marland Kitchen SIMVASTATIN 40 MG PO TABS Oral Take 40 mg by mouth at bedtime.      Marland Kitchen ZOLPIDEM TARTRATE 10 MG PO TABS Oral Take 10 mg by mouth at bedtime as needed. For sleep       BP 160/75  Pulse 85  Temp(Src) 98.7 F (37.1 C) (Oral)  Resp 18  SpO2 96%  Physical Exam  Nursing note and vitals reviewed. Constitutional: She appears well-nourished. No distress.       The patient appears in no significant discomfort, awake, alert, and oriented to person only. She denies any pain or injury. There are no apparent injuries on examination.  HENT:  Head: Normocephalic. Head is without raccoon's eyes, without Battle's sign, without abrasion, without contusion, without laceration, without right periorbital erythema and without left periorbital erythema. No trismus in the jaw.  Right Ear:  Tympanic membrane, external ear and ear canal normal. No drainage or tenderness. No mastoid tenderness. Tympanic membrane is not perforated. No hemotympanum.  Left Ear: Tympanic membrane, external ear and ear canal normal. No drainage or tenderness. No mastoid tenderness. Tympanic membrane is not perforated. No hemotympanum.  Nose: Nose normal. No mucosal edema, rhinorrhea, nose lacerations, sinus tenderness, nasal deformity, septal deviation or nasal septal hematoma. No epistaxis. Right sinus exhibits no maxillary sinus tenderness and no frontal sinus tenderness. Left sinus exhibits no maxillary sinus tenderness and no frontal sinus tenderness.  Mouth/Throat: Uvula is midline, oropharynx  is clear and moist and mucous membranes are normal. No lacerations.         The patient has a small, 1 cm U shaped laceration at the underside of the anterior chin with surrounding contusion. It is not actively bleeding. Otherwise, her upper and lower dentition occlude normally. She has some pain at the bilateral temporomandibular joint with opening and closing of the jaw but normal movement without any popping, clicking, or other abnormal movement.  Eyes: Conjunctivae, EOM and lids are normal. Pupils are equal, round, and reactive to light. Right eye exhibits no chemosis and no discharge. Left eye exhibits no chemosis and no discharge. Right conjunctiva is not injected. Right conjunctiva has no hemorrhage. Left conjunctiva is not injected. Left conjunctiva has no hemorrhage. Right eye exhibits normal extraocular motion. Left eye exhibits normal extraocular motion.  Neck: Trachea normal, normal range of motion and phonation normal. Neck supple. No JVD present. No tracheal tenderness, no spinous process tenderness and no muscular tenderness present. No tracheal deviation present.       Immobilized in C collar which was removed by me, followed by more detailed examination of the cervical spine which showed no deformity or  tenderness to the posterior cervical vertebrae, no impairment of the active range of motion on behalf of the patient, and no pain with rotation to the left, the right, flexion of the neck, or extension of the head and neck. The cervical collar was therefore cleared by me.  Cardiovascular: Normal rate, regular rhythm, S1 normal, S2 normal, normal heart sounds and intact distal pulses.  Exam reveals no gallop, no distant heart sounds and no friction rub.   No murmur heard. Pulmonary/Chest: Effort normal and breath sounds normal. No accessory muscle usage or stridor. Not tachypneic. No respiratory distress. She has no decreased breath sounds. She has no wheezes. She has no rales. She exhibits no tenderness, no bony tenderness, no crepitus, no deformity and no retraction.  Abdominal: Soft. Normal appearance and bowel sounds are normal. She exhibits no distension, no pulsatile midline mass and no mass. There is no splenomegaly or hepatomegaly. There is no tenderness. There is no rigidity, no rebound, no guarding and no CVA tenderness.  Musculoskeletal: Normal range of motion. She exhibits no edema and no tenderness.       Right shoulder: Normal.       Left shoulder: Normal.       Right elbow: Normal.      Left elbow: Normal.       Right wrist: Normal.       Left wrist: Normal.       Right hip: She exhibits normal range of motion, normal strength, no tenderness, no bony tenderness, no crepitus and no deformity.       Left hip: She exhibits normal range of motion, normal strength, no tenderness, no bony tenderness, no crepitus and no deformity.       Right knee: Normal.       Left knee: Normal.       Right ankle: Normal.       Left ankle: Normal.       Cervical back: She exhibits no tenderness, no bony tenderness, no swelling, no deformity and no pain.       Thoracic back: She exhibits no tenderness, no bony tenderness, no deformity and no pain.       Lumbar back: She exhibits no tenderness, no bony  tenderness, no deformity and no pain.  Neurological: She is alert. She  is disoriented. No cranial nerve deficit or sensory deficit. She exhibits normal muscle tone. GCS eye subscore is 4. GCS verbal subscore is 3. GCS motor subscore is 5.  Skin: Skin is warm and dry. Abrasion and bruising noted. No rash noted. She is not diaphoretic. No erythema. No pallor.  Psychiatric: She has a normal mood and affect. Her speech is normal and behavior is normal.    ED Course  LACERATION REPAIR Date/Time: 02/01/2011 8:23 PM Performed by: Kennon Rounds D Authorized by: Kennon Rounds D Consent: Verbal consent obtained. Written consent not obtained. Risks and benefits: risks, benefits and alternatives were discussed Consent given by: patient Patient understanding: patient states understanding of the procedure being performed Required items: required blood products, implants, devices, and special equipment available Patient identity confirmed: verbally with patient and arm band Time out: Immediately prior to procedure a "time out" was called to verify the correct patient, procedure, equipment, support staff and site/side marked as required. Body area: head/neck Location details: chin Laceration length: 1 cm Foreign bodies: no foreign bodies Nerve involvement: none Vascular damage: no Anesthesia method: None. Patient sedated: no Preparation: Patient was prepped and draped in the usual sterile fashion. Irrigation solution: saline Irrigation method: syringe Amount of cleaning: extensive Debridement: none Degree of undermining: none Skin closure: glue Approximation: close Approximation difficulty: simple Patient tolerance: Patient tolerated the procedure well with no immediate complications.   (including critical care time)  Labs Reviewed - No data to display No results found.   No diagnosis found.    MDM  76 year old female with apparent trip and fall with laceration to her chin and an  Rennis Harding 2 fracture of the right lower first molar. No apparent serious head injury, neck injury, extremity injury, or injury otherwise. The laceration on her chin was repaired with Dermabond successfully.        Desiree Bonier, MD 02/01/11 2025

## 2011-03-25 ENCOUNTER — Ambulatory Visit (INDEPENDENT_AMBULATORY_CARE_PROVIDER_SITE_OTHER): Payer: Medicare Other | Admitting: Family Medicine

## 2011-03-25 ENCOUNTER — Encounter: Payer: Self-pay | Admitting: Family Medicine

## 2011-03-25 DIAGNOSIS — E119 Type 2 diabetes mellitus without complications: Secondary | ICD-10-CM

## 2011-03-25 DIAGNOSIS — G4700 Insomnia, unspecified: Secondary | ICD-10-CM

## 2011-03-25 DIAGNOSIS — F5104 Psychophysiologic insomnia: Secondary | ICD-10-CM

## 2011-03-25 DIAGNOSIS — I10 Essential (primary) hypertension: Secondary | ICD-10-CM

## 2011-03-25 DIAGNOSIS — E785 Hyperlipidemia, unspecified: Secondary | ICD-10-CM

## 2011-03-25 MED ORDER — ZOLPIDEM TARTRATE 10 MG PO TABS: 10.0000 mg | ORAL_TABLET | Freq: Every evening | ORAL | Status: DC | PRN

## 2011-03-25 MED ORDER — GLUCOSE BLOOD VI STRP: ORAL_STRIP | Status: DC

## 2011-03-25 MED ORDER — ONETOUCH LANCETS MISC: Status: AC

## 2011-03-25 NOTE — Progress Notes (Signed)
Subjective:    Patient ID: [name], female    DOB: 19-Nov-1920, 78 y.o.   MRN: [id number]  HPI  Medical followup. Patient has history of peripheral vascular disease had recent left carotid endarterectomy. She has recovered well since then. No further TIA type symptoms. She has history of CAD, type 2 diabetes, hypertension, type 2 diabetes which has been diet controlled. History of chronic insomnia. Takes Ambien 10 mg. She has tried scaling back to 5 mg without much success. Requesting refills.  Hyperlipidemia treated with simvastatin. Due for repeat lipids soon.  Acute right great toe swelling couple weeks ago. No injury. She described warm tender toe. No definitive history of gout.  Past Medical History  Diagnosis Date  . COLONIC POLYPS, ADENOMATOUS 12/26/2009  . DIABETES MELLITUS, TYPE II 08/11/2006  . HYPERLIPIDEMIA 09/09/2006  . ANEMIA, NORMOCYTIC 11/30/2009  . HYPERTENSION 09/09/2006  . CAD, NATIVE VESSEL 04/27/2008    Cardiac catheterization 6/08: EF 55%, 1+ MR, Dx 50%, LAD 50%, dLAD 40%, OM1 50-60% (up to 70%), pOM 30%, pRCA 40%, mRCA 50-60%, pPDA 50-60% (up to 70%).  Medical therapy was recommended  . LEFT BUNDLE BRANCH BLOCK 08/11/2006  . Atrial flutter 04/12/2009  . TIA 09/02/2006    Echocardiogram 6/12: Mild to moderate MR, trace AI, trace TR, PASP 34, bubble study negative for intracardiac shunting, normal EF (greater than 55%)  . HEMORRHOIDS-INTERNAL 12/26/2009  . GERD 02/16/2007  . DIVERTICULOSIS, COLON 08/11/2006  . OSTEOARTHRITIS 08/11/2006  . OSTEOPOROSIS 08/11/2006  . Carotid stenosis     dopplers 6/12: LICA 50-79%   Past Surgical History  Procedure Date  . Tonsillectomy   . Colon surgery 2001  . Eye surgery     catarac  . Closed reduction metatarsal fracture     right 5th    reports that she has never smoked. She has never used smokeless tobacco. She reports that she drinks alcohol. She reports that she does not use illicit drugs. family history includes Cancer  in her brother and sister; Heart disease in her brother; Heart disease (age of onset:72) in her mother; and Heart disease (age of onset:93) in her father. Allergies  Allergen Reactions  . Doxycycline Hyclate     REACTION: upset stomach  . Nitrofurantoin     REACTION: unspecified  . Sulfamethoxazole W/Trimethoprim     REACTION: unspecified  . Sulfonamide Derivatives     REACTION: rash      Review of Systems  Constitutional: Negative for fatigue.  Eyes: Negative for visual disturbance.  Respiratory: Negative for cough, chest tightness, shortness of breath and wheezing.   Cardiovascular: Negative for chest pain, palpitations and leg swelling.  Genitourinary: Negative for dysuria.  Neurological: Negative for dizziness, seizures, syncope, weakness, light-headedness and headaches.  Psychiatric/Behavioral: Negative for confusion.       Objective:   Physical Exam  Constitutional: She is oriented to person, place, and time. She appears well-developed and well-nourished.  HENT:  Mouth/Throat: Oropharynx is clear and moist.  Neck: Neck supple.  Cardiovascular: Normal rate and regular rhythm.   Pulmonary/Chest: Effort normal and breath sounds normal. No respiratory distress. She has no wheezes. She has no rales.  Musculoskeletal: She exhibits no edema.  Lymphadenopathy:    She has no cervical adenopathy.  Neurological: She is alert and oriented to person, place, and time.          Assessment & Plan:  #1 history of hyperlipidemia. Schedule labs with lipid and hepatic panel #2 type 2 diabetes. History of  good control. Provide new home monitor. Recheck A1c at followup  #3 hypertension stable at goal #4 peripheral vascular disease with recent left carotid endarterectomy  #5 history of chronic insomnia. We discussed trying to scale back her Ambien but she has been unsuccessful in the past

## 2011-04-03 ENCOUNTER — Telehealth: Payer: Self-pay | Admitting: Family Medicine

## 2011-04-03 MED ORDER — ZOLPIDEM TARTRATE 10 MG PO TABS: 10.0000 mg | ORAL_TABLET | Freq: Every evening | ORAL | Status: DC | PRN

## 2011-04-03 NOTE — Telephone Encounter (Signed)
Patient called stating she was supposed to have had an rx for ambien called into [facility] and it is not done per pt. Please assist and inform patient when done.

## 2011-04-04 NOTE — Telephone Encounter (Signed)
Pt informed Rx faxed, confirmation received

## 2011-04-26 ENCOUNTER — Telehealth: Payer: Self-pay | Admitting: Internal Medicine

## 2011-04-26 DIAGNOSIS — K649 Unspecified hemorrhoids: Secondary | ICD-10-CM

## 2011-04-26 NOTE — Telephone Encounter (Signed)
Patient calling about her hemorrhoid that has caused her problems for some time now. States she has mucous and bleeding from the hemorrhoid. She is using Anusol HC and cream but is still having trouble. Per her Centricity chart, she was referred to a surgeon in 12/2009 she was referred to a surgeon but patient does not recall ever seeing a Careers adviser. Dr. [name], do you want me to refer her again or schedule OV here. Please, advise.

## 2011-04-26 NOTE — Telephone Encounter (Signed)
Line busy. Will try again later.

## 2011-04-26 NOTE — Telephone Encounter (Signed)
Please refer back to Dr Donell Beers, she had an appointment with her on 01/19/2010 but did not go. She needs to see her again.

## 2011-04-29 NOTE — Telephone Encounter (Signed)
Spoke with patient and she would like to see a surgeon about her hemorrhoids. Left a message for [place] at [facility] to call me.

## 2011-04-29 NOTE — Telephone Encounter (Signed)
Spoke with [place] and scheduled patient on 05/24/11 arrive at 8:30 for 9:00 AM appointment with Dr. Donell Beers.

## 2011-04-30 ENCOUNTER — Other Ambulatory Visit (INDEPENDENT_AMBULATORY_CARE_PROVIDER_SITE_OTHER): Payer: Medicare Other

## 2011-04-30 ENCOUNTER — Encounter: Payer: Self-pay | Admitting: Family Medicine

## 2011-04-30 DIAGNOSIS — E119 Type 2 diabetes mellitus without complications: Secondary | ICD-10-CM

## 2011-04-30 DIAGNOSIS — E785 Hyperlipidemia, unspecified: Secondary | ICD-10-CM

## 2011-04-30 DIAGNOSIS — I10 Essential (primary) hypertension: Secondary | ICD-10-CM

## 2011-04-30 LAB — HEPATIC FUNCTION PANEL
ALT: 14 U/L (ref 0–35)
AST: 24 U/L (ref 0–37)
Bilirubin, Direct: 0 mg/dL (ref 0.0–0.3)
Total Bilirubin: 0.6 mg/dL (ref 0.3–1.2)
Total Protein: 7.4 g/dL (ref 6.0–8.3)

## 2011-04-30 LAB — LIPID PANEL
Cholesterol: 144 mg/dL (ref 0–200)
VLDL: 25.2 mg/dL (ref 0.0–40.0)

## 2011-04-30 LAB — BASIC METABOLIC PANEL
BUN: 26 mg/dL — ABNORMAL HIGH (ref 6–23)
Calcium: 9.6 mg/dL (ref 8.4–10.5)
GFR: 56.15 mL/min — ABNORMAL LOW (ref >60.00)
Glucose, Bld: 110 mg/dL — ABNORMAL HIGH (ref 70–99)
Potassium: 5.1 mEq/L (ref 3.5–5.1)
Sodium: 140 mEq/L (ref 135–145)

## 2011-05-03 ENCOUNTER — Ambulatory Visit (INDEPENDENT_AMBULATORY_CARE_PROVIDER_SITE_OTHER): Payer: Medicare Other | Admitting: Cardiology

## 2011-05-03 ENCOUNTER — Encounter: Payer: Self-pay | Admitting: Cardiology

## 2011-05-03 VITALS — BP 142/60 | HR 65 | Ht 67.0 in | Wt 126.4 lb

## 2011-05-03 DIAGNOSIS — E785 Hyperlipidemia, unspecified: Secondary | ICD-10-CM

## 2011-05-03 DIAGNOSIS — H3400 Transient retinal artery occlusion, unspecified eye: Secondary | ICD-10-CM

## 2011-05-03 DIAGNOSIS — G453 Amaurosis fugax: Secondary | ICD-10-CM

## 2011-05-03 DIAGNOSIS — I2510 Atherosclerotic heart disease of native coronary artery without angina pectoris: Secondary | ICD-10-CM

## 2011-05-03 DIAGNOSIS — I447 Left bundle-branch block, unspecified: Secondary | ICD-10-CM

## 2011-05-03 NOTE — Patient Instructions (Signed)
Your physician wants you to follow-up in: 6 MONTHS.  You will receive a reminder letter in the mail two months in advance. If you don't receive a letter, please call our office to schedule the follow-up appointment.  Your physician recommends that you continue on your current medications as directed. Please refer to the Current Medication list given to you today.  

## 2011-05-18 NOTE — Progress Notes (Signed)
HPI:  She has been tired since Monday, but has been working out in the yard for many hours.  Just before Christmas, she tripped at the [employer], and hit her chin.  Since I last saw her, she also underwent L carotid endarterectomy by Dr.Early.  She has recovered uneventfully for this.  She had had two episodes of what sounds like amaurosis and had an ulcerative plaque noted.    Per Dr. Bosie Helper note: CT angiogram: 60% stenosis in her left internal carotid artery. Since extremely irregular and ulcerated with a severe calcific plaque.  Impression and plan: Left eye amaurosis fugax. Negative cardiac workup. CT angiogram showing no aortic arch or proximal disease. irregular ulcerative left bifurcation carotid plaque with 60% stenosis. I discussed this at length with the patient. I explained that her carotid stenosis was the most likely etiology of her visual symptoms. I have recommended carotid endarterectomy for reduction of risk of left eye blindness or stroke. He understands expected one-day hospitalization and 1-2% risk of stroke with the procedure. Also discussed potential risk of cranial nerve injury. She understands and wished to proceed with this after discussing further with her family.         Current Outpatient Prescriptions  Medication Sig Dispense Refill  . aspirin 81 MG tablet Take 81 mg by mouth daily.        . calcium carbonate 200 MG capsule Take 250 mg by mouth 2 (two) times daily with a meal.        . Cholecalciferol (VITAMIN D PO) Take 1 tablet by mouth daily.       . clopidogrel (PLAVIX) 75 MG tablet Take 75 mg by mouth daily.        . Cyanocobalamin (VITAMIN B 12 PO) Take 1 tablet by mouth daily.       . fish oil-omega-3 fatty acids 1000 MG capsule Take 1 g by mouth daily.       Marland Kitchen glucose blood test strip Use as instructed daily  100 each  3  . metoprolol tartrate (LOPRESSOR) 25 MG tablet Take 12.5 mg by mouth 2 (two) times daily.        . Multiple Vitamin (MULTIVITAMIN)  tablet Take 1 tablet by mouth daily.        . Multiple Vitamins-Minerals (OCUVITE ADULT 50+ PO) Take by mouth daily.        . ONE TOUCH LANCETS MISC Use daily as directed  200 each  3  . pantoprazole (PROTONIX) 40 MG tablet Take 20 mg by mouth every other day.        . simvastatin (ZOCOR) 40 MG tablet Take 40 mg by mouth at bedtime.        Marland Kitchen zolpidem (AMBIEN) 10 MG tablet Take 1 tablet (10 mg total) by mouth at bedtime as needed. For sleep  90 tablet  1    Allergies  Allergen Reactions  . Doxycycline Hyclate     REACTION: upset stomach  . Nitrofurantoin     REACTION: unspecified  . Sulfamethoxazole W/Trimethoprim     REACTION: unspecified  . Sulfonamide Derivatives     REACTION: rash    Past Medical History  Diagnosis Date  . COLONIC POLYPS, ADENOMATOUS 12/26/2009  . DIABETES MELLITUS, TYPE II 08/11/2006  . HYPERLIPIDEMIA 09/09/2006  . ANEMIA, NORMOCYTIC 11/30/2009  . HYPERTENSION 09/09/2006  . CAD, NATIVE VESSEL 04/27/2008    Cardiac catheterization 6/08: EF 55%, 1+ MR, Dx 50%, LAD 50%, dLAD 40%, OM1 50-60% (up to 70%),  pOM 30%, pRCA 40%, mRCA 50-60%, pPDA 50-60% (up to 70%).  Medical therapy was recommended  . LEFT BUNDLE BRANCH BLOCK 08/11/2006  . Atrial flutter 04/12/2009  . TIA 09/02/2006    Echocardiogram 6/12: Mild to moderate MR, trace AI, trace TR, PASP 34, bubble study negative for intracardiac shunting, normal EF (greater than 55%)  . HEMORRHOIDS-INTERNAL 12/26/2009  . GERD 02/16/2007  . DIVERTICULOSIS, COLON 08/11/2006  . OSTEOARTHRITIS 08/11/2006  . OSTEOPOROSIS 08/11/2006  . Carotid stenosis     dopplers 6/12: LICA 50-79%    Past Surgical History  Procedure Date  . Tonsillectomy   . Colon surgery 2001  . Eye surgery     catarac  . Closed reduction metatarsal fracture     right 5th    Family History  Problem Relation Age of Onset  . Heart disease Mother 47    MI  . Heart disease Father 41    CHF  . Cancer Sister     breast, uterine  . Cancer Brother      colon  . Heart disease Brother     History   Social History  . Marital Status: Widowed    Spouse Name: N/A    Number of Children: N/A  . Years of Education: N/A   Occupational History  . Not on file.   Social History Main Topics  . Smoking status: Never Smoker   . Smokeless tobacco: Never Used  . Alcohol Use: Yes     1 glass of wine with dinner  . Drug Use: No  . Sexually Active: Not on file   Other Topics Concern  . Not on file   Social History Narrative  . No narrative on file    ROS: Please see the HPI.  All other systems reviewed and negative.  PHYSICAL EXAM:  BP 142/60  Pulse 65  Ht 5\' 7"  (1.702 m)  Wt 126 lb 6.4 oz (57.335 kg)  BMI 19.80 kg/m2  General: Thin, well kept older woman in no acute distress Head:  Normocephalic and atraumatic. Neck: no JVD.  L carotid scar.  Lungs: Clear to auscultation and percussion. Heart: Normal S1 and S2.  No murmur, rubs or gallops.  Abdomen:  Normal bowel sounds; soft; non tender; no organomegaly Pulses: Pulses normal in all 4 extremities. Extremities: No clubbing or cyanosis. No edema. Neurologic: Alert and oriented x 3.  EKG:  NSR.  LBBB.  No acute changes.  ECHO 2008  (not in EPIC)    SUMMARY   -  Overall left ventricular systolic function was normal. Left         ventricular ejection fraction was estimated , range being 55         % to 60 %. There was hypokinesis of the septal wall. Left         ventricular wall thickness was mildly increased.   -  Aortic valve thickness was mildly to moderately increased. There         was trivial aortic valvular regurgitation.   -  There was mild to moderate thickening of the mitral valve. There         was moderate mitral annular calcification. The findings were         consistent with very mild mitral stenosis. There was mild         mitral valvular regurgitation. Mitral valve area by pressure         half-time was 2.37 cm^2.   -  The  left atrium was mildly  dilated.   ASSESSMENT AND PLAN:

## 2011-05-20 NOTE — Assessment & Plan Note (Addendum)
No current angina.  Also remains on dual antiplatelet therapy, for a variety of reasons, but if she starts to fall frequently, this will need to be reexamined in her overall scope of care because of bleeding risk.  However, given all of the various issues, benefits may outweigh risks.

## 2011-05-20 NOTE — Assessment & Plan Note (Signed)
Previously noted.

## 2011-05-20 NOTE — Assessment & Plan Note (Signed)
No recurrent episodes.  SP carotid.

## 2011-05-20 NOTE — Assessment & Plan Note (Signed)
Tolerating simva and likely should remain on therapy.

## 2011-05-24 ENCOUNTER — Ambulatory Visit (INDEPENDENT_AMBULATORY_CARE_PROVIDER_SITE_OTHER): Payer: Medicare Other | Admitting: General Surgery

## 2011-05-24 ENCOUNTER — Encounter (INDEPENDENT_AMBULATORY_CARE_PROVIDER_SITE_OTHER): Payer: Self-pay | Admitting: General Surgery

## 2011-05-24 VITALS — BP 138/60 | HR 68 | Temp 98.6°F | Resp 18 | Ht 66.5 in | Wt 125.2 lb

## 2011-05-24 DIAGNOSIS — K648 Other hemorrhoids: Secondary | ICD-10-CM

## 2011-05-24 NOTE — Assessment & Plan Note (Signed)
Pt with large prolapsing internal hemorrhoids x2. These were reduced and injected. Reviewed bowel regimen/sitz baths with patient.  Will follow up in 6-8 weeks.  Advised pt that these may need to be reinjected several times.

## 2011-05-24 NOTE — Patient Instructions (Signed)
Hemorrhoids  Hemorrhoids are enlarged (dilated) veins around the rectum. There are 2 types of hemorrhoids, and the type of hemorrhoid is determined by its location. Internal hemorrhoids occur in the veins just inside the rectum.They are usually not painful, but they may bleed.However, they may poke through to the outside and become irritated and painful. External hemorrhoids involve the veins outside the anus and can be felt as a painful swelling or hard lump near the anus.They are often itchy and may crack and bleed. Sometimes clots will form in the veins. This makes them swollen and painful. These are called thrombosed hemorrhoids. CAUSES Causes of hemorrhoids include:  Pregnancy. This increases the pressure in the hemorrhoidal veins.   Constipation.   Straining to have a bowel movement.   Obesity.   Heavy lifting or other activity that caused you to strain.   TREATMENT Most of the time hemorrhoids improve in 1 to 2 weeks. However, if symptoms do not seem to be getting better or if you have a lot of rectal bleeding, your caregiver may perform a procedure to help make the hemorrhoids get smaller or remove them completely.Possible treatments include:  Rubber band ligation. A rubber band is placed at the base of the hemorrhoid to cut off the circulation.   Sclerotherapy. A chemical is injected to shrink the hemorrhoid.   Infrared light therapy. Tools are used to burn the hemorrhoid.   Hemorrhoidectomy. This is surgical removal of the hemorrhoid.   HOME CARE INSTRUCTIONS   Increase fiber in your diet. Ask your caregiver about using fiber supplements.   Drink enough water and fluids to keep your urine clear or pale yellow.   Exercise regularly.   Go to the bathroom when you have the urge to have a bowel movement. Do not wait.   Avoid straining to have bowel movements.   Keep the anal area dry and clean.   Only take over-the-counter or prescription medicines for pain,  discomfort, or fever as directed by your caregiver.   Take warm sitz baths for 20 to 30 minutes, 3 to 4 times per day or use squirt bottle to clean.    If the hemorrhoids are very tender and swollen, place ice packs on the area as tolerated. Using ice packs between sitz baths may be helpful. Fill a plastic bag with ice. Place a towel between the bag of ice and your skin.   Medicated creams and suppositories may be used or applied as directed.   Do not use a donut-shaped pillow or sit on the toilet for long periods. This increases blood pooling and pain.   Take a stool softener such as colace or drug store equivalent twice daily.    SEEK MEDICAL CARE IF:   You have increasing pain and swelling that is not controlled with your medicine.   You have uncontrolled bleeding.   You have difficulty or you are unable to have a bowel movement.   You have pain or inflammation outside the area of the hemorrhoids.   You have chills or an oral temperature above 102 F (38.9 C).

## 2011-05-24 NOTE — Progress Notes (Addendum)
Chief Complaint  Patient presents with  . Hemorrhoids    new pt    HISTORY: The patient is an 78 year old female who has been having problems with hemorrhoids for several years. This severity waxes and wanes. She has mucus and blood in her underwear and has to wear pads. She has tried prescription suppositories as well as cream. these seemed to improve it temporarily but they always come back. She has to push them back in. She has occasionally had blood dripping into the toilet, but this is not her primary problem. She has much difficulty keeping her anal area clean. It itches and burns frequently.  She describes her stools as hard at first but then soft toward the end. She does have to sit on the toilet for quite a while to start having a bowel movement.   Past Medical History  Diagnosis Date  . COLONIC POLYPS, ADENOMATOUS 12/26/2009  . DIABETES MELLITUS, TYPE II 08/11/2006  . HYPERLIPIDEMIA 09/09/2006  . ANEMIA, NORMOCYTIC 11/30/2009  . HYPERTENSION 09/09/2006  . CAD, NATIVE VESSEL 04/27/2008    Cardiac catheterization 6/08: EF 55%, 1+ MR, Dx 50%, LAD 50%, dLAD 40%, OM1 50-60% (up to 70%), pOM 30%, pRCA 40%, mRCA 50-60%, pPDA 50-60% (up to 70%).  Medical therapy was recommended  . LEFT BUNDLE BRANCH BLOCK 08/11/2006  . Atrial flutter 04/12/2009  . TIA 09/02/2006    Echocardiogram 6/12: Mild to moderate MR, trace AI, trace TR, PASP 34, bubble study negative for intracardiac shunting, normal EF (greater than 55%)  . HEMORRHOIDS-INTERNAL 12/26/2009  . GERD 02/16/2007  . DIVERTICULOSIS, COLON 08/11/2006  . OSTEOARTHRITIS 08/11/2006  . OSTEOPOROSIS 08/11/2006  . Carotid stenosis     dopplers 6/12: LICA 50-79%    Past Surgical History  Procedure Date  . Tonsillectomy   . Eye surgery     catarac  . Closed reduction metatarsal fracture     right 5th  . Colonoscopy w/ polypectomy 2001    Current Outpatient Prescriptions  Medication Sig Dispense Refill  . aspirin 81 MG tablet Take 81 mg by  mouth daily.        . calcium carbonate 200 MG capsule Take 250 mg by mouth 2 (two) times daily with a meal.        . Cholecalciferol (VITAMIN D PO) Take 1 tablet by mouth daily.       . clopidogrel (PLAVIX) 75 MG tablet Take 75 mg by mouth daily.        . Cyanocobalamin (VITAMIN B 12 PO) Take 1 tablet by mouth daily.       . fish oil-omega-3 fatty acids 1000 MG capsule Take 1 g by mouth daily.       Marland Kitchen glucose blood test strip Use as instructed daily  100 each  3  . metoprolol tartrate (LOPRESSOR) 25 MG tablet Take 12.5 mg by mouth 2 (two) times daily.        . Multiple Vitamin (MULTIVITAMIN) tablet Take 1 tablet by mouth daily.        . Multiple Vitamins-Minerals (OCUVITE ADULT 50+ PO) Take by mouth daily.        . ONE TOUCH LANCETS MISC Use daily as directed  200 each  3  . pantoprazole (PROTONIX) 40 MG tablet Take 20 mg by mouth every other day.        . simvastatin (ZOCOR) 40 MG tablet Take 40 mg by mouth at bedtime.        Marland Kitchen zolpidem (AMBIEN) 10 MG tablet  Take 1 tablet (10 mg total) by mouth at bedtime as needed. For sleep  90 tablet  1     Allergies  Allergen Reactions  . Doxycycline Hyclate     REACTION: upset stomach  . Nitrofurantoin     REACTION: unspecified  . Sulfamethoxazole W/Trimethoprim     REACTION: unspecified  . Sulfonamide Derivatives     REACTION: rash     Family History  Problem Relation Age of Onset  . Heart disease Mother 50    MI  . Kidney disease Mother   . Heart disease Father 29    CHF  . Cancer Sister     breast, uterine  . Cancer Brother     colon  . Heart disease Brother      History   Social History  . Marital Status: Widowed    Spouse Name: N/A    Number of Children: N/A  . Years of Education: N/A   Social History Main Topics  . Smoking status: Never Smoker   . Smokeless tobacco: Never Used  . Alcohol Use: Yes     1 glass of wine with dinner  . Drug Use: No  . Sexually Active: None   Other Topics Concern  . None   Social  History Narrative  . None     REVIEW OF SYSTEMS - PERTINENT POSITIVES ONLY: 12 point review of systems negative other than HPI and PMH except for hearing loss and unsteady gait.    EXAM: Filed Vitals:   05/24/11 0907  BP: 138/60  Pulse: 68  Temp: 98.6 F (37 C)  Resp: 18    Gen:  No acute distress.  Well nourished and well groomed.   Neurological: Alert and oriented to person, place, and time. Coordination normal.  Head: Normocephalic and atraumatic.  Eyes: Conjunctivae are normal. Pupils are equal, round, and reactive to light. No scleral icterus.  Neck: Normal range of motion. Cardiovascular: Normal rate, regular rhythm,  and intact distal pulses.   Respiratory: Effort normal.  No respiratory distress.  Rectal:  Large prolapsing hemorrhoids bilaterally.  Minimal external hemorrhoids.    Musculoskeletal: Normal range of motion. Skin: Skin is warm and dry. No rash noted. No diaphoresis. No erythema. No pallor. No clubbing, cyanosis, or edema.   Psychiatric: Normal mood and affect. Behavior is normal. Judgment and thought content normal.    ASSESSMENT AND PLAN: HEMORRHOIDS-INTERNAL Pt with large prolapsing internal hemorrhoids x2. These were reduced and injected. Reviewed bowel regimen/sitz baths with patient.  Will follow up in 6-8 weeks.  Advised pt that these may need to be reinjected several times.        Maudry Diego MD Surgical Oncology, General and Endocrine Surgery [facility]      Visit Diagnoses: 1. HEMORRHOIDS-INTERNAL     Primary Care Physician: Kristian Covey, MD, MD

## 2011-06-04 ENCOUNTER — Other Ambulatory Visit: Payer: Self-pay | Admitting: *Deleted

## 2011-06-04 DIAGNOSIS — I6529 Occlusion and stenosis of unspecified carotid artery: Secondary | ICD-10-CM

## 2011-06-07 ENCOUNTER — Other Ambulatory Visit: Payer: Self-pay | Admitting: *Deleted

## 2011-06-07 MED ORDER — METOPROLOL TARTRATE 25 MG PO TABS: 12.5000 mg | ORAL_TABLET | Freq: Two times a day (BID) | ORAL | Status: DC

## 2011-06-07 MED ORDER — CLOPIDOGREL BISULFATE 75 MG PO TABS: 75.0000 mg | ORAL_TABLET | Freq: Every day | ORAL | Status: DC

## 2011-06-07 MED ORDER — SIMVASTATIN 40 MG PO TABS: 40.0000 mg | ORAL_TABLET | Freq: Every day | ORAL | Status: DC

## 2011-06-10 ENCOUNTER — Encounter: Payer: Self-pay | Admitting: Neurosurgery

## 2011-06-11 ENCOUNTER — Encounter: Payer: Self-pay | Admitting: Neurosurgery

## 2011-06-11 ENCOUNTER — Ambulatory Visit (INDEPENDENT_AMBULATORY_CARE_PROVIDER_SITE_OTHER): Payer: Medicare Other | Admitting: Neurosurgery

## 2011-06-11 ENCOUNTER — Other Ambulatory Visit (INDEPENDENT_AMBULATORY_CARE_PROVIDER_SITE_OTHER): Payer: Medicare Other | Admitting: *Deleted

## 2011-06-11 ENCOUNTER — Other Ambulatory Visit: Payer: Medicare Other

## 2011-06-11 VITALS — BP 155/60 | HR 70 | Resp 16 | Ht 66.5 in | Wt 125.9 lb

## 2011-06-11 DIAGNOSIS — Z48812 Encounter for surgical aftercare following surgery on the circulatory system: Secondary | ICD-10-CM

## 2011-06-11 DIAGNOSIS — I6529 Occlusion and stenosis of unspecified carotid artery: Secondary | ICD-10-CM | POA: Insufficient documentation

## 2011-06-11 NOTE — Progress Notes (Signed)
[facility] [place] HISTORY AND PHYSICAL   CC: Six-month carotid duplex exam Referring Physician: Early  History of Present Illness: 78 year old female patient of Dr. Arbie Cookey who is 6 months status post left CEA October 2012. Patient reports no signs or symptoms of CVA, TIA, diplopia, dysphasia, amaurosis fugax or any word finding difficulty. She reports no new medical problems or no recent surgeries.  Past Medical History  Diagnosis Date  . COLONIC POLYPS, ADENOMATOUS 12/26/2009  . DIABETES MELLITUS, TYPE II 08/11/2006  . HYPERLIPIDEMIA 09/09/2006  . ANEMIA, NORMOCYTIC 11/30/2009  . HYPERTENSION 09/09/2006  . CAD, NATIVE VESSEL 04/27/2008    Cardiac catheterization 6/08: EF 55%, 1+ MR, Dx 50%, LAD 50%, dLAD 40%, OM1 50-60% (up to 70%), pOM 30%, pRCA 40%, mRCA 50-60%, pPDA 50-60% (up to 70%).  Medical therapy was recommended  . LEFT BUNDLE BRANCH BLOCK 08/11/2006  . Atrial flutter 04/12/2009  . TIA 09/02/2006    Echocardiogram 6/12: Mild to moderate MR, trace AI, trace TR, PASP 34, bubble study negative for intracardiac shunting, normal EF (greater than 55%)  . HEMORRHOIDS-INTERNAL 12/26/2009  . GERD 02/16/2007  . DIVERTICULOSIS, COLON 08/11/2006  . OSTEOARTHRITIS 08/11/2006  . OSTEOPOROSIS 08/11/2006  . Carotid stenosis     dopplers 6/12: LICA 50-79%    ROS: [x]  Positive   [ ]  Denies    General: [ ]  Weight loss, [ ]  Fever, [ ]  chills Neurologic: [ ]  Dizziness, [ ]  Blackouts, [ ]  Seizure [ ]  Stroke, [ ]  "Mini stroke", [ ]  Slurred speech, [ ]  Temporary blindness; [ ]  weakness in arms or legs, [ ]  Hoarseness Cardiac: [ ]  Chest pain/pressure, [ ]  Shortness of breath at rest [ ]  Shortness of breath with exertion, [ ]  Atrial fibrillation or irregular heartbeat Vascular: [ ]  Pain in legs with walking, [ ]  Pain in legs at rest, [ ]  Pain in legs at night,  [ ]  Non-healing ulcer, [ ]  Blood clot in vein/DVT,   Pulmonary: [ ]  Home oxygen, [ ]  Productive cough, [ ]  Coughing up blood,  [ ]  Asthma,  [ ]  Wheezing Musculoskeletal:  [ ]  Arthritis, [ ]  Low back pain, [ ]  Joint pain Hematologic: [ ]  Easy Bruising, [ ]  Anemia; [ ]  Hepatitis Gastrointestinal: [ ]  Blood in stool, [ ]  Gastroesophageal Reflux/heartburn, [ ]  Trouble swallowing Urinary: [ ]  chronic Kidney disease, [ ]  on HD - [ ]  MWF or [ ]  TTHS, [ ]  Burning with urination, [ ]  Difficulty urinating Skin: [ ]  Rashes, [ ]  Wounds Psychological: [ ]  Anxiety, [ ]  Depression   Social History History  Substance Use Topics  . Smoking status: Never Smoker   . Smokeless tobacco: Never Used  . Alcohol Use: Yes     1 glass of wine with dinner    Family History Family History  Problem Relation Age of Onset  . Heart disease Mother 27    MI  . Kidney disease Mother   . Heart disease Father 31    CHF  . Cancer Sister     breast, uterine  . Cancer Brother     colon  . Heart disease Brother     Allergies  Allergen Reactions  . Doxycycline Hyclate     REACTION: upset stomach  . Nitrofurantoin     REACTION: unspecified  . Sulfamethoxazole W-Trimethoprim     REACTION: unspecified  . Sulfonamide Derivatives     REACTION: rash    Current Outpatient Prescriptions  Medication Sig Dispense  Refill  . aspirin 81 MG tablet Take 81 mg by mouth daily.        . calcium carbonate 200 MG capsule Take 250 mg by mouth 2 (two) times daily with a meal.        . Cholecalciferol (VITAMIN D PO) Take 1 tablet by mouth daily.       . clopidogrel (PLAVIX) 75 MG tablet Take 1 tablet (75 mg total) by mouth daily.  90 tablet  3  . Cyanocobalamin (VITAMIN B 12 PO) Take 1 tablet by mouth daily.       . fish oil-omega-3 fatty acids 1000 MG capsule Take 1 g by mouth daily.       Marland Kitchen glucose blood test strip Use as instructed daily  100 each  3  . metoprolol tartrate (LOPRESSOR) 25 MG tablet Take 0.5 tablets (12.5 mg total) by mouth 2 (two) times daily.  90 tablet  3  . Multiple Vitamin (MULTIVITAMIN) tablet Take 1 tablet by mouth daily.         . Multiple Vitamins-Minerals (OCUVITE ADULT 50+ PO) Take by mouth daily.        . ONE TOUCH LANCETS MISC Use daily as directed  200 each  3  . pantoprazole (PROTONIX) 40 MG tablet Take 20 mg by mouth every other day.        . simvastatin (ZOCOR) 40 MG tablet Take 1 tablet (40 mg total) by mouth at bedtime.  90 tablet  3  . zolpidem (AMBIEN) 10 MG tablet Take 1 tablet (10 mg total) by mouth at bedtime as needed. For sleep  90 tablet  1    Physical Examination  Filed Vitals:   06/11/11 1413  BP: 155/60  Pulse: 70  Resp: 16    Body mass index is 20.02 kg/(m^2).  General:  WDWN in NAD Gait: Normal HEENT: WNL Eyes: Pupils equal Pulmonary: normal non-labored breathing , without Rales, rhonchi,  wheezing Cardiac: RRR, without  Murmurs, rubs or gallops; Abdomen: soft, NT, no masses Skin: no rashes, ulcers noted  Vascular Exam Pulses: Patient has 2-3+ radial pulses bilaterally Carotid bruits: No carotid bruits, 3+ bounding carotid pulses bilaterally Extremities without ischemic changes, no Gangrene , no cellulitis; no open wounds;  Musculoskeletal: no muscle wasting or atrophy   Neurologic: A&O X 3; Appropriate Affect ; SENSATION: normal; MOTOR FUNCTION:  moving all extremities equally. Speech is fluent/normal  Non-Invasive Vascular Imaging CAROTID DUPLEX 06/11/2011  Right ICA 0 - 19% stenosis Left ICA 0 - 19% stenosis   ASSESSMENT/PLAN: Assessment as above, the patient return in one year for repeat carotid duplex exam and followup in my clinic. Her questions were encouraged and answered.  Lauree Chandler ANP   Clinic MD: Early

## 2011-06-12 NOTE — Progress Notes (Signed)
Addended by: Sharee Pimple on: 06/12/2011 08:37 AM   Modules accepted: Orders

## 2011-06-13 ENCOUNTER — Ambulatory Visit: Payer: Medicare Other | Admitting: Family Medicine

## 2011-06-14 ENCOUNTER — Ambulatory Visit: Payer: Medicare Other | Admitting: Family Medicine

## 2011-06-17 ENCOUNTER — Encounter: Payer: Medicare Other | Admitting: Family Medicine

## 2011-06-18 NOTE — Procedures (Unsigned)
CAROTID DUPLEX EXAM  INDICATION:  Carotid disease  HISTORY: Diabetes:  No Cardiac:  No Hypertension:  No Smoking:  No Previous Surgery:  Left carotid endarterectomy on 12/05/2010 CV History:  Currently asymptomatic Amaurosis Fugax No, Paresthesias No, Hemiparesis No                                      RIGHT             LEFT Brachial systolic pressure:         134               130 Brachial Doppler waveforms:         Normal            Normal Vertebral direction of flow:        Antegrade         Antegrade DUPLEX VELOCITIES (cm/sec) CCA peak systolic                   73                78 ECA peak systolic                   75                72 ICA peak systolic                   49                64 ICA end diastolic                   13                16 PLAQUE MORPHOLOGY:                  Heterogenous PLAQUE AMOUNT:                      Minimal           None PLAQUE LOCATION:                    ECA/bifurcation  IMPRESSION:  Patent left carotid endarterectomy site with no left ICA stenosis. No hemodynamically significant stenosis noted in the right internal carotid artery. No significant change in Doppler velocities when compared to the previous exam on 11/13/2010.  _  __________________________________________ Larina Earthly, M.D.  CH/MEDQ  D:  06/14/2011  T:  06/14/2011  Job:  [id number]

## 2011-06-25 ENCOUNTER — Other Ambulatory Visit: Payer: Medicare Other

## 2011-06-25 ENCOUNTER — Ambulatory Visit: Payer: Medicare Other | Admitting: Vascular Surgery

## 2011-07-12 ENCOUNTER — Ambulatory Visit (INDEPENDENT_AMBULATORY_CARE_PROVIDER_SITE_OTHER): Payer: Medicare Other | Admitting: Family Medicine

## 2011-07-12 ENCOUNTER — Encounter: Payer: Self-pay | Admitting: Family Medicine

## 2011-07-12 VITALS — BP 120/68 | HR 56 | Temp 97.6°F | Ht 66.5 in | Wt 123.0 lb

## 2011-07-12 DIAGNOSIS — M810 Age-related osteoporosis without current pathological fracture: Secondary | ICD-10-CM

## 2011-07-12 DIAGNOSIS — I10 Essential (primary) hypertension: Secondary | ICD-10-CM

## 2011-07-12 DIAGNOSIS — R634 Abnormal weight loss: Secondary | ICD-10-CM

## 2011-07-12 DIAGNOSIS — E785 Hyperlipidemia, unspecified: Secondary | ICD-10-CM

## 2011-07-12 DIAGNOSIS — IMO0001 Reserved for inherently not codable concepts without codable children: Secondary | ICD-10-CM

## 2011-07-12 DIAGNOSIS — D649 Anemia, unspecified: Secondary | ICD-10-CM

## 2011-07-12 DIAGNOSIS — I2510 Atherosclerotic heart disease of native coronary artery without angina pectoris: Secondary | ICD-10-CM

## 2011-07-12 DIAGNOSIS — Z9181 History of falling: Secondary | ICD-10-CM

## 2011-07-12 DIAGNOSIS — Z0000 Encounter for general adult medical examination without abnormal findings: Secondary | ICD-10-CM

## 2011-07-12 DIAGNOSIS — E119 Type 2 diabetes mellitus without complications: Secondary | ICD-10-CM

## 2011-07-12 LAB — MICROALBUMIN / CREATININE URINE RATIO
Creatinine,U: 86 mg/dL
Microalb, Ur: 2.2 mg/dL — ABNORMAL HIGH (ref 0.0–1.9)

## 2011-07-12 LAB — BASIC METABOLIC PANEL
BUN: 25 mg/dL — ABNORMAL HIGH (ref 6–23)
CO2: 27 mEq/L (ref 19–32)
Calcium: 9.3 mg/dL (ref 8.4–10.5)
Chloride: 102 mEq/L (ref 96–112)
Creatinine, Ser: 0.9 mg/dL (ref 0.4–1.2)
Glucose, Bld: 105 mg/dL — ABNORMAL HIGH (ref 70–99)

## 2011-07-12 LAB — HEPATIC FUNCTION PANEL
ALT: 33 U/L (ref 0–35)
Total Bilirubin: 0.9 mg/dL (ref 0.3–1.2)

## 2011-07-12 LAB — CBC WITH DIFFERENTIAL/PLATELET
Basophils Absolute: 0 10*3/uL (ref 0.0–0.1)
Basophils Relative: 0.3 % (ref 0.0–3.0)
Eosinophils Absolute: 0.2 10*3/uL (ref 0.0–0.7)
Lymphocytes Relative: 29.3 % (ref 12.0–46.0)
MCHC: 33.3 g/dL (ref 30.0–36.0)
MCV: 99.2 fl (ref 78.0–100.0)
Monocytes Absolute: 0.6 10*3/uL (ref 0.1–1.0)
Neutrophils Relative %: 57.8 % (ref 43.0–77.0)
Platelets: 204 10*3/uL (ref 150.0–400.0)
RDW: 13.8 % (ref 11.5–14.6)

## 2011-07-12 LAB — HEMOGLOBIN A1C: Hgb A1c MFr Bld: 6.4 % (ref 4.6–6.5)

## 2011-07-12 NOTE — Patient Instructions (Signed)
We will call you with physical therapy and nutrition consults. Consider bone density scan with next mammogram this October Continue regular supplementation with calcium and vitamin D. Consider discontinuing protonix as tolerated

## 2011-07-12 NOTE — Progress Notes (Signed)
Subjective:    Patient ID: [name], female    DOB: 1920-09-21, 78 y.o.   MRN: [id number]  HPI  Patient seen for medical followup and Medicare wellness exam. She has multiple chronic problems including type 2 diabetes, CAD, peripheral vascular disease with previous left carotid endarterectomy, history of TIA, hypertension, hyperlipidemia, osteoporosis currently not treated, history of anemia with hemoglobin 9.2 following carotid surgery last October, GERD, and osteoarthritis.  Past medical history, family history, and social history reviewed as indicated below. She's had some recent mild weight loss. Appetite is fair. Weight 127 pounds in February down to 123 today. Denies any depressive symptoms. She had bad fall several months ago it has been somewhat reluctant to engage in activity since then. She has fear of tripping. No orthostasis. No syncope. No recent chest pains.  Recent labs revealed A1C 6.7%. Lipids at goal. Hemoglobin 9.2 last October with no recheck since then.  Past Medical History  Diagnosis Date  . COLONIC POLYPS, ADENOMATOUS 12/26/2009  . DIABETES MELLITUS, TYPE II 08/11/2006  . HYPERLIPIDEMIA 09/09/2006  . ANEMIA, NORMOCYTIC 11/30/2009  . HYPERTENSION 09/09/2006  . CAD, NATIVE VESSEL 04/27/2008    Cardiac catheterization 6/08: EF 55%, 1+ MR, Dx 50%, LAD 50%, dLAD 40%, OM1 50-60% (up to 70%), pOM 30%, pRCA 40%, mRCA 50-60%, pPDA 50-60% (up to 70%).  Medical therapy was recommended  . LEFT BUNDLE BRANCH BLOCK 08/11/2006  . Atrial flutter 04/12/2009  . TIA 09/02/2006    Echocardiogram 6/12: Mild to moderate MR, trace AI, trace TR, PASP 34, bubble study negative for intracardiac shunting, normal EF (greater than 55%)  . HEMORRHOIDS-INTERNAL 12/26/2009  . GERD 02/16/2007  . DIVERTICULOSIS, COLON 08/11/2006  . OSTEOARTHRITIS 08/11/2006  . OSTEOPOROSIS 08/11/2006  . Carotid stenosis     dopplers 6/12: LICA 50-79%   Past Surgical History  Procedure Date  . Tonsillectomy   . Eye  surgery     catarac  . Closed reduction metatarsal fracture     right 5th  . Colonoscopy w/ polypectomy 2001    reports that she has never smoked. She has never used smokeless tobacco. She reports that she drinks alcohol. She reports that she does not use illicit drugs. family history includes Cancer in her brother and sister; Heart disease in her brother; Heart disease (age of onset:72) in her mother; Heart disease (age of onset:93) in her father; and Kidney disease in her mother. Allergies  Allergen Reactions  . Doxycycline Hyclate     REACTION: upset stomach  . Nitrofurantoin     REACTION: unspecified  . Sulfamethoxazole W-Trimethoprim     REACTION: unspecified  . Sulfonamide Derivatives     REACTION: rash      Review of Systems  Constitutional: Positive for fatigue and unexpected weight change. Negative for fever, chills and appetite change.  HENT: Positive for hearing loss (chronic) and tinnitus (bilateral and intermittent and chronic). Negative for congestion, sore throat, trouble swallowing and voice change.   Eyes: Negative for visual disturbance.  Respiratory: Negative for cough, shortness of breath and wheezing.   Cardiovascular: Negative for chest pain, palpitations and leg swelling.  Gastrointestinal: Negative for vomiting, abdominal pain, diarrhea, constipation and blood in stool.  Genitourinary: Negative for dysuria, hematuria and vaginal bleeding.  Musculoskeletal: Negative for back pain and joint swelling.  Skin: Negative for rash.  Neurological: Negative for dizziness, seizures, syncope and weakness.  Hematological: Negative for adenopathy.  Psychiatric/Behavioral: Negative for suicidal ideas, confusion and dysphoric mood.  Objective:   Physical Exam  Constitutional: She is oriented to person, place, and time. She appears well-developed and well-nourished.  HENT:  Right Ear: External ear normal.  Left Ear: External ear normal.  Mouth/Throat:  Oropharynx is clear and moist.  Neck: Neck supple. No thyromegaly present.  Cardiovascular: Normal rate and regular rhythm.   Pulmonary/Chest: Effort normal and breath sounds normal. No respiratory distress. She has no wheezes. She has no rales.  Abdominal: Soft. Bowel sounds are normal. She exhibits no distension and no mass. There is no tenderness. There is no rebound and no guarding.  Musculoskeletal: She exhibits no edema.  Lymphadenopathy:    She has no cervical adenopathy.  Neurological: She is alert and oriented to person, place, and time. No cranial nerve deficit.  Skin: No rash noted.  Psychiatric: She has a normal mood and affect. Her behavior is normal.          Assessment & Plan:  #1 health maintenance exam. Immunizations up-to-date. Colonoscopy up-to-date. Due for repeat bone density scan. Mammogram next fall. Patient does not require further Pap smears given her age. She refuses breast exam today. #2 history type 2 diabetes. History of good control. Not treated with medication. Repeat A1c  #3 history of hyperlipidemia. Recently controlled at goal by recent labs so not repeated today #4 mild weight loss. No other concerning symptoms. Set up see nutritionist to discuss strategies for healthy weight gain.  Check labs including TSH #5 at risk for falls. Patient is hesitant to walk because of prior severe fall. Set up physical therapy for fall risk reduction #6 history of osteoporosis. Repeat DEXA scan with next mammogram-this is ordered today. Continue calcium and vitamin D supplementation #7 history of anemia following carotid endarterectomy surgery last fall. Not checked since then. She's had some recent malaise. Repeat CBC

## 2011-07-17 ENCOUNTER — Ambulatory Visit (INDEPENDENT_AMBULATORY_CARE_PROVIDER_SITE_OTHER): Payer: Medicare Other | Admitting: General Surgery

## 2011-07-17 ENCOUNTER — Telehealth: Payer: Self-pay | Admitting: *Deleted

## 2011-07-17 ENCOUNTER — Encounter (INDEPENDENT_AMBULATORY_CARE_PROVIDER_SITE_OTHER): Payer: Self-pay | Admitting: General Surgery

## 2011-07-17 VITALS — BP 138/60 | HR 74 | Temp 97.8°F | Resp 14 | Ht 66.5 in | Wt 125.6 lb

## 2011-07-17 DIAGNOSIS — K648 Other hemorrhoids: Secondary | ICD-10-CM

## 2011-07-17 NOTE — Progress Notes (Signed)
Quick Note:  Pt informed. Pt requested list of foods to increase Hgb. ______

## 2011-07-17 NOTE — Telephone Encounter (Signed)
High protein meal plan sent to pt with labs

## 2011-07-17 NOTE — Assessment & Plan Note (Signed)
Prolapse resolved.  If symptoms recur, I advised her to restart suppositories.  Recommend increasing stool softener to twice daily.    Follow up as needed.

## 2011-07-17 NOTE — Progress Notes (Signed)
Chief Complaint  Patient presents with  . Follow-up    reck hems    HISTORY: Patient is doing dramatically better since I reduced and injected her prolapsed internal hemorrhoids. She is no longer having any bleeding or mucus on her underwear. She denies any itching or burning or anus. She has had a few episodes of constipation but today has had diarrhea. She does take a stool softener once daily. She is trying to spend less time of the toilet.   EXAM: Filed Vitals:   07/17/11 1537  BP: 138/60  Pulse: 74  Temp: 97.8 F (36.6 C)  Resp: 14    Gen:  No acute distress.  Well nourished and well groomed.   Neurological: Alert and oriented to person, place, and time. Coordination normal.  Head: Normocephalic and atraumatic.  Eyes: Conjunctivae are normal. Pupils are equal, round, and reactive to light. No scleral icterus.  Neck: Normal range of motion. Cardiovascular: Normal rate, regular rhythm,  and intact distal pulses.   Respiratory: Effort normal.  No respiratory distress.  Rectal:  Small circumferential external hemorrhoids.  No thrombosis seen.  Internal exam not repeated.     Musculoskeletal: Normal range of motion. Skin: Skin is warm and dry. No rash noted. No diaphoresis. No erythema. No pallor. No clubbing, cyanosis, or edema.   Psychiatric: Normal mood and affect. Behavior is normal. Judgment and thought content normal.    ASSESSMENT AND PLAN: HEMORRHOIDS-INTERNAL Prolapse resolved.  If symptoms recur, I advised her to restart suppositories.  Recommend increasing stool softener to twice daily.    Follow up as needed.      Maudry Diego MD Surgical Oncology, General and Endocrine Surgery [facility]      Visit Diagnoses: 1. HEMORRHOIDS-INTERNAL     Primary Care Physician: Kristian Covey, MD, MD

## 2011-07-17 NOTE — Patient Instructions (Signed)
Continue warm water showers/baths.  Take stool softener twice daily.  (100 mg)  Hemorrhoids  Hemorrhoids are enlarged (dilated) veins around the rectum. There are 2 types of hemorrhoids, and the type of hemorrhoid is determined by its location. Internal hemorrhoids occur in the veins just inside the rectum.They are usually not painful, but they may bleed.However, they may poke through to the outside and become irritated and painful. External hemorrhoids involve the veins outside the anus and can be felt as a painful swelling or hard lump near the anus.They are often itchy and may crack and bleed. Sometimes clots will form in the veins. This makes them swollen and painful. These are called thrombosed hemorrhoids. CAUSES Causes of hemorrhoids include:  Pregnancy. This increases the pressure in the hemorrhoidal veins.   Constipation.   Straining to have a bowel movement.   Obesity.   Heavy lifting or other activity that caused you to strain.   TREATMENT Most of the time hemorrhoids improve in 1 to 2 weeks. However, if symptoms do not seem to be getting better or if you have a lot of rectal bleeding, your caregiver may perform a procedure to help make the hemorrhoids get smaller or remove them completely.Possible treatments include:  Rubber band ligation. A rubber band is placed at the base of the hemorrhoid to cut off the circulation.   Sclerotherapy. A chemical is injected to shrink the hemorrhoid.   Infrared light therapy. Tools are used to burn the hemorrhoid.   Hemorrhoidectomy. This is surgical removal of the hemorrhoid.   HOME CARE INSTRUCTIONS   Increase fiber in your diet. Ask your caregiver about using fiber supplements.   Drink enough water and fluids to keep your urine clear or pale yellow.   Exercise regularly.   Go to the bathroom when you have the urge to have a bowel movement. Do not wait.   Avoid straining to have bowel movements.   Keep the anal area dry  and clean.   Only take over-the-counter or prescription medicines for pain, discomfort, or fever as directed by your caregiver.   Take warm sitz baths for 20 to 30 minutes, 3 to 4 times per day.   If the hemorrhoids are very tender and swollen, place ice packs on the area as tolerated. Using ice packs between sitz baths may be helpful. Fill a plastic bag with ice. Place a towel between the bag of ice and your skin.   Medicated creams and suppositories may be used or applied as directed.   Do not use a donut-shaped pillow or sit on the toilet for long periods. This increases blood pooling and pain.   SEEK MEDICAL CARE IF:   You have increasing pain and swelling that is not controlled with your medicine.   You have uncontrolled bleeding.   You have difficulty or you are unable to have a bowel movement.   You have pain or inflammation outside the area of the hemorrhoids.   You have chills or an oral temperature above 102 F (38.9 C).

## 2011-07-17 NOTE — Progress Notes (Signed)
Quick Note:  Labs and high protein meal plan mailed to pt home ______

## 2011-07-23 ENCOUNTER — Ambulatory Visit: Payer: Medicare Other | Attending: Family Medicine | Admitting: Physical Therapy

## 2011-07-23 DIAGNOSIS — IMO0001 Reserved for inherently not codable concepts without codable children: Secondary | ICD-10-CM | POA: Insufficient documentation

## 2011-07-23 DIAGNOSIS — R269 Unspecified abnormalities of gait and mobility: Secondary | ICD-10-CM | POA: Insufficient documentation

## 2011-07-31 ENCOUNTER — Ambulatory Visit: Payer: Medicare Other | Admitting: Physical Therapy

## 2011-08-01 ENCOUNTER — Ambulatory Visit: Payer: Medicare Other | Admitting: Physical Therapy

## 2011-08-07 ENCOUNTER — Ambulatory Visit: Payer: Medicare Other | Admitting: Physical Therapy

## 2011-08-08 ENCOUNTER — Encounter: Payer: Medicare Other | Attending: Family Medicine | Admitting: *Deleted

## 2011-08-08 ENCOUNTER — Encounter: Payer: Self-pay | Admitting: *Deleted

## 2011-08-08 VITALS — Ht 66.0 in | Wt 128.6 lb

## 2011-08-08 DIAGNOSIS — E119 Type 2 diabetes mellitus without complications: Secondary | ICD-10-CM | POA: Insufficient documentation

## 2011-08-08 DIAGNOSIS — Z713 Dietary counseling and surveillance: Secondary | ICD-10-CM | POA: Insufficient documentation

## 2011-08-08 NOTE — Progress Notes (Signed)
  Medical Nutrition Therapy:  Appt start time: 1400 end time:  1500.   Assessment:  Primary concerns today: weight loss and diabetes.   MEDICATIONS: see list   DIETARY INTAKE:  Usual eating pattern includes 3 meals and 1-2 snacks per day.  Everyday foods include balanced diet of starches, proteins, vegetables, and some fats.  Avoided foods include fruits.    24-hr recall:  B ( AM): cheerios or oatmeal or special k with whole milk scrambled egg and bacon qod.  2 slices toast with sugar free jelly and coffee  Snk ( AM): half glass Boost made with whole milk soemtimes  L ( PM): sandwich, fruit, cookies with water Snk ( PM): Boost with whole milk D ( PM): baked potato or sweet potato, ham and vegetables with salad Snk ( PM): ice cream sometimes Beverages: water, coffee, wine on occasion  Usual physical activity: gardening and physical therapy  Estimated energy needs: 1600 calories 180 g carbohydrates 120 g protein 44 g fat  Progress Towards Goal(s):  In progress.   Nutritional Diagnosis:  [place]-3.2 Unintentional weight loss As related to strict low carbohydrate diet.  As evidenced by self- reported 10 pound weight loss since 12/11.    Intervention:  Nutrition counseling provided.  Patient is here related to unintentional weight loss.  She has fairly well controlled diabetes.  Recent HgA1c of 6.4%.  She states weighing 123 lb a little while ago and has been trying to gain weight back by eating more carbohydrates.  She limits dietary fats except from whole milk.  Reviewed what foods affect her blood sugar and how to count those carbohydrates.  Encouraged increasing calories from proteins and fats, rather than from carbohydrates.   Goals: Eat three meals a day with 3 carb choices, 2 proteins, 2 fats Aim for 2-3 snacks with 1 carb choices plus protein Try Boost Glucose Control drink instead of regular Boost Use calorie booster handout for snack ideas  Handouts given during visit  include:  Meal plan card  Monitoring/Evaluation:  Dietary intake, exercise, BGM, and body weight in 1 month(s).

## 2011-08-08 NOTE — Patient Instructions (Addendum)
Eat three meals a day with 3 carb choices, 2 proteins, 2 fats Aim for 2-3 snacks with 1 carb choices plus protein Try Boost Glucose Control drink Use calorie booster handout for snack idea

## 2011-08-09 ENCOUNTER — Ambulatory Visit: Payer: Medicare Other | Admitting: Physical Therapy

## 2011-08-12 ENCOUNTER — Ambulatory Visit: Payer: Medicare Other | Attending: Family Medicine | Admitting: Physical Therapy

## 2011-08-12 DIAGNOSIS — IMO0001 Reserved for inherently not codable concepts without codable children: Secondary | ICD-10-CM | POA: Insufficient documentation

## 2011-08-12 DIAGNOSIS — R269 Unspecified abnormalities of gait and mobility: Secondary | ICD-10-CM | POA: Insufficient documentation

## 2011-08-19 ENCOUNTER — Telehealth: Payer: Self-pay | Admitting: Family Medicine

## 2011-08-19 MED ORDER — GLUCOSE BLOOD VI STRP: ORAL_STRIP | Status: DC

## 2011-08-19 NOTE — Telephone Encounter (Signed)
Pt needs script for One Touch Ultra II test strips #100. Pls call in to [facility] on [address] asap today. Pt is completely out of strips. Pt req call from [facility] when this has been done. Pt has been having problems with low blood sugar readings.

## 2011-08-19 NOTE — Telephone Encounter (Signed)
I spoke with pt to let her know I had filled her test strips.  Pt reports she had an event on Saturday at a friends house, she had her normal breakfast, however she was outside longer than planned and passed out, friend called EMS, her BS was down to 47.  She was hydrated and her BS was back to normal.  She has been monitoring her BS, Sat at home after event 199, before bed 124.  Sunday 6:30 am 121, three hours later 155, this am 129, so she feels she has BS under control and would prefer to wait to see Dr Caryl Never when he returns next week.  She promised to cal for appt if her BS drops again for re-evaluation.  We scheduled OV with Dr Caryl Never on Monday 7/15 at 1:45 am

## 2011-08-26 ENCOUNTER — Ambulatory Visit (INDEPENDENT_AMBULATORY_CARE_PROVIDER_SITE_OTHER): Payer: Medicare Other | Admitting: Family Medicine

## 2011-08-26 ENCOUNTER — Encounter: Payer: Self-pay | Admitting: Family Medicine

## 2011-08-26 VITALS — BP 150/60 | Temp 98.1°F | Wt 131.0 lb

## 2011-08-26 DIAGNOSIS — E119 Type 2 diabetes mellitus without complications: Secondary | ICD-10-CM

## 2011-08-26 DIAGNOSIS — I2510 Atherosclerotic heart disease of native coronary artery without angina pectoris: Secondary | ICD-10-CM

## 2011-08-26 DIAGNOSIS — R55 Syncope and collapse: Secondary | ICD-10-CM

## 2011-08-26 DIAGNOSIS — E162 Hypoglycemia, unspecified: Secondary | ICD-10-CM

## 2011-08-26 NOTE — Patient Instructions (Addendum)
Drink plenty of fluids Follow up immediately for any recurrent syncope episodes.

## 2011-08-26 NOTE — Progress Notes (Signed)
Subjective:    Patient ID: [name], female    DOB: [birth year], 78 y.o.   MRN: [id number]  HPI  Seen for recent syncopal episode. This occurred on July 6. She was at her friend's house outside in heat then went inside after feeling poorly-dizzy and light headed. She felt slightly nauseous and dizzy. She had reportedly very transient syncopal episode after she first sat down the table. Denied any chest pains. No dyspnea. No palpitations. EMS called. Blood sugar 46. She's not taking any diabetic medications. After taking some fluids and eating her blood sugars improved and sugars up since since then. Her fasting blood sugars are consistently been around 120s. No other hypoglycemic symptoms. No further syncope.  She denies any consistent orthostatic type symptoms. She's not taking any blood pressure medications. No dyspnea with exertion.  Past Medical History  Diagnosis Date  . COLONIC POLYPS, ADENOMATOUS 12/26/2009  . DIABETES MELLITUS, TYPE II 08/11/2006  . HYPERLIPIDEMIA 09/09/2006  . ANEMIA, NORMOCYTIC 11/30/2009  . HYPERTENSION 09/09/2006  . CAD, NATIVE VESSEL 04/27/2008    Cardiac catheterization 6/08: EF 55%, 1+ MR, Dx 50%, LAD 50%, dLAD 40%, OM1 50-60% (up to 70%), pOM 30%, pRCA 40%, mRCA 50-60%, pPDA 50-60% (up to 70%).  Medical therapy was recommended  . LEFT BUNDLE BRANCH BLOCK 08/11/2006  . Atrial flutter 04/12/2009  . TIA 09/02/2006    Echocardiogram 6/12: Mild to moderate MR, trace AI, trace TR, PASP 34, bubble study negative for intracardiac shunting, normal EF (greater than 55%)  . HEMORRHOIDS-INTERNAL 12/26/2009  . GERD 02/16/2007  . DIVERTICULOSIS, COLON 08/11/2006  . OSTEOARTHRITIS 08/11/2006  . OSTEOPOROSIS 08/11/2006  . Carotid stenosis     dopplers 6/12: LICA 50-79%   Past Surgical History  Procedure Date  . Tonsillectomy   . Eye surgery     catarac  . Closed reduction metatarsal fracture     right 5th  . Colonoscopy w/ polypectomy 2001    reports that she has  never smoked. She has never used smokeless tobacco. She reports that she drinks alcohol. She reports that she does not use illicit drugs. family history includes Cancer in her brother and sister; Heart disease in her brother; Heart disease (age of onset:72) in her mother; Heart disease (age of onset:93) in her father; and Kidney disease in her mother. Allergies  Allergen Reactions  . Doxycycline Hyclate     REACTION: upset stomach  . Nitrofurantoin     REACTION: unspecified  . Sulfamethoxazole W-Trimethoprim     REACTION: unspecified  . Sulfonamide Derivatives     REACTION: rash      Review of Systems  Constitutional: Negative for fever, chills and unexpected weight change.  Respiratory: Negative for cough and shortness of breath.   Cardiovascular: Negative for chest pain, palpitations and leg swelling.  Gastrointestinal: Negative for abdominal pain.  Genitourinary: Negative for dysuria.  Neurological: Positive for syncope. Negative for weakness and headaches.  Hematological: Negative for adenopathy.  Psychiatric/Behavioral: Negative for confusion.       Objective:   Physical Exam  Constitutional: She is oriented to person, place, and time. She appears well-developed and well-nourished.  HENT:  Mouth/Throat: Oropharynx is clear and moist.  Neck: Neck supple. No thyromegaly present.  Cardiovascular: Normal rate and regular rhythm.   Pulmonary/Chest: Effort normal and breath sounds normal. No respiratory distress. She has no wheezes. She has no rales.  Abdominal: Soft. There is no tenderness.  Neurological: She is alert and oriented to person, place, and time.  No cranial nerve deficit.          Assessment & Plan:  Brief syncopal episode. Possible mild hypoglycemia though she has no valid reason medically to have significant hypoglycemia since she does not take any diabetic medications. She had several fasting blood sugars normal since then so workup for insulinoma would not  be appropriate at this time.  Check EKG.  EKG shows chronic LBBB with no acute change.

## 2011-09-02 ENCOUNTER — Encounter: Payer: Self-pay | Admitting: *Deleted

## 2011-09-02 ENCOUNTER — Encounter: Payer: Medicare Other | Attending: Family Medicine | Admitting: *Deleted

## 2011-09-02 VITALS — Wt 130.2 lb

## 2011-09-02 DIAGNOSIS — Z713 Dietary counseling and surveillance: Secondary | ICD-10-CM | POA: Insufficient documentation

## 2011-09-02 DIAGNOSIS — E119 Type 2 diabetes mellitus without complications: Secondary | ICD-10-CM | POA: Insufficient documentation

## 2011-09-02 NOTE — Patient Instructions (Signed)
Aim to walk in large store 3 day a week Choose lower carb snack than boost Continue to count carbs at meal

## 2011-09-02 NOTE — Progress Notes (Signed)
  Medical Nutrition Therapy:  Appt start time: 1115 end time:  1145.   Assessment:  Primary concerns today: diabetes.  Patient is here for follow-up appointment.  She was referred for diabetes education, but she wants to gain weight   MEDICATIONS: see list   DIETARY INTAKE:  Usual eating pattern includes 3 meals and 3 snacks per day.  Everyday foods include proteins, starches, vegetables.  Avoided foods include none.    24-hr recall:  B ( AM): cereal with milk, scrambled egg,1 toast, coffee  Snk ( AM): boost   L ( PM): whole grain sandwich with ham and cheese with fruit and decaff tea Snk ( PM): cookie or 2 D ( PM): meat, starch, vegetables Snk ( PM): boost Beverages: decaff tea, water  Usual physical activity: none  Estimated energy needs:  1600 calories  180 g carbohydrates  120 g protein  44 g fat  Progress Towards Goal(s):  Some progress.   Nutritional Diagnosis:  [place]-5.8.2 Excessive carbohydrate intake As related to desire to acheive weight gain.  As evidenced by multiple servings of Boost poweder mixed with milk daily.    Intervention:  Nutrition counseling provided.  Applauded Cinzia for her appropriately planned meals.  Encouraged carb counting at breakfast and adding proteins for increased satiety.  Discussed boost as a snack being very high in carbohydrates and encouraged her several times to choose another snack like Boost Glucose control or a carb-free protein powder or nuts, etc.  Encouraged chair exercises because Shalen doesn't want to walk.  She reports her FBG is WNL, but doesn't check a post prandial glucose.  Educated her about the effects of a high carb meal/snack on her blood sugar  Handouts given during visit include:  Chair exercises  Food label handout  Monitoring/Evaluation:  Dietary intake, exercise, and body weight prn.  Patient wished to terminate nutrition services.

## 2011-10-31 ENCOUNTER — Ambulatory Visit (INDEPENDENT_AMBULATORY_CARE_PROVIDER_SITE_OTHER): Payer: Medicare Other | Admitting: Cardiology

## 2011-10-31 ENCOUNTER — Encounter: Payer: Self-pay | Admitting: Cardiology

## 2011-10-31 VITALS — BP 136/62 | HR 69 | Ht 66.0 in | Wt 131.0 lb

## 2011-10-31 DIAGNOSIS — G459 Transient cerebral ischemic attack, unspecified: Secondary | ICD-10-CM

## 2011-10-31 DIAGNOSIS — I2510 Atherosclerotic heart disease of native coronary artery without angina pectoris: Secondary | ICD-10-CM

## 2011-10-31 DIAGNOSIS — E785 Hyperlipidemia, unspecified: Secondary | ICD-10-CM

## 2011-10-31 LAB — CBC WITH DIFFERENTIAL/PLATELET
Eosinophils Absolute: 0.2 10*3/uL (ref 0.0–0.7)
Eosinophils Relative: 3 % (ref 0.0–5.0)
Lymphocytes Relative: 33 % (ref 12.0–46.0)
MCHC: 32.9 g/dL (ref 30.0–36.0)
MCV: 100.3 fl — ABNORMAL HIGH (ref 78.0–100.0)
Monocytes Absolute: 0.8 10*3/uL (ref 0.1–1.0)
Neutrophils Relative %: 53.5 % (ref 43.0–77.0)
Platelets: 194 10*3/uL (ref 150.0–400.0)
WBC: 7.7 10*3/uL (ref 4.5–10.5)

## 2011-10-31 NOTE — Assessment & Plan Note (Addendum)
No recurrent episodes recently.  We discussed DAPT in that regard.  Check CBC and continue to monitor.  She is not a good candidate for antithrombin therapy.  She also says she is more unsteady, but is unwilling to consider assisted living or chronic nursing home care.

## 2011-10-31 NOTE — Patient Instructions (Signed)
Your physician recommends that you have lab work today: CBC  Your physician wants you to follow-up in: 4 MONTHS. You will receive a reminder letter in the mail two months in advance. If you don't receive a letter, please call our office to schedule the follow-up appointment.  Your physician recommends that you continue on your current medications as directed. Please refer to the Current Medication list given to you today.

## 2011-10-31 NOTE — Assessment & Plan Note (Signed)
No recurrent chest pain. 

## 2011-10-31 NOTE — Progress Notes (Signed)
HPI:  The patient is seen in followup today. During the summer, she had a sinking spell in which she was noted to have a sugar of 44. He was a hot summer day, and she went out with a friend. She became weak, and somewhat nauseated. Apparently emergency services came out, and she had a low sugar. She ultimately responded. She says that she has episodes like this intermittently she seen Dr. Sander Radon the interim, and labs have been checked.  She currently has no chest pain. She is also not had any other episodes that sound like transient ischemic attacks. She otherwise remained stable.       Current Outpatient Prescriptions  Medication Sig Dispense Refill  . aspirin 81 MG tablet Take 81 mg by mouth daily.        . calcium carbonate 200 MG capsule Take 250 mg by mouth 2 (two) times daily with a meal.        . Cholecalciferol (VITAMIN D PO) Take 1 tablet by mouth daily.       . clopidogrel (PLAVIX) 75 MG tablet Take 1 tablet (75 mg total) by mouth daily.  90 tablet  3  . Cyanocobalamin (VITAMIN B 12 PO) Take 1 tablet by mouth daily.       . fish oil-omega-3 fatty acids 1000 MG capsule Take 1 g by mouth daily.       Marland Kitchen glucose blood test strip Use as instructed daily  100 each  3  . metoprolol tartrate (LOPRESSOR) 25 MG tablet Take 0.5 tablets (12.5 mg total) by mouth 2 (two) times daily.  90 tablet  3  . Multiple Vitamin (MULTIVITAMIN) tablet Take 1 tablet by mouth daily.        . Multiple Vitamins-Minerals (OCUVITE ADULT 50+ PO) Take by mouth daily.        . ONE TOUCH LANCETS MISC Use daily as directed  200 each  3  . pantoprazole (PROTONIX) 40 MG tablet Take 20 mg by mouth every other day.        . simvastatin (ZOCOR) 40 MG tablet Take 1 tablet (40 mg total) by mouth at bedtime.  90 tablet  3  . zolpidem (AMBIEN) 10 MG tablet Take 1 tablet (10 mg total) by mouth at bedtime as needed. For sleep  90 tablet  1    Allergies  Allergen Reactions  . Doxycycline Hyclate     REACTION: upset stomach   . Nitrofurantoin     REACTION: unspecified  . Sulfamethoxazole W-Trimethoprim     REACTION: unspecified  . Sulfonamide Derivatives     REACTION: rash    Past Medical History  Diagnosis Date  . COLONIC POLYPS, ADENOMATOUS 12/26/2009  . DIABETES MELLITUS, TYPE II 08/11/2006  . HYPERLIPIDEMIA 09/09/2006  . ANEMIA, NORMOCYTIC 11/30/2009  . HYPERTENSION 09/09/2006  . CAD, NATIVE VESSEL 04/27/2008    Cardiac catheterization 6/08: EF 55%, 1+ MR, Dx 50%, LAD 50%, dLAD 40%, OM1 50-60% (up to 70%), pOM 30%, pRCA 40%, mRCA 50-60%, pPDA 50-60% (up to 70%).  Medical therapy was recommended  . LEFT BUNDLE BRANCH BLOCK 08/11/2006  . Atrial flutter 04/12/2009  . TIA 09/02/2006    Echocardiogram 6/12: Mild to moderate MR, trace AI, trace TR, PASP 34, bubble study negative for intracardiac shunting, normal EF (greater than 55%)  . HEMORRHOIDS-INTERNAL 12/26/2009  . GERD 02/16/2007  . DIVERTICULOSIS, COLON 08/11/2006  . OSTEOARTHRITIS 08/11/2006  . OSTEOPOROSIS 08/11/2006  . Carotid stenosis     dopplers 6/12: LICA  50-79%    Past Surgical History  Procedure Date  . Tonsillectomy   . Eye surgery     catarac  . Closed reduction metatarsal fracture     right 5th  . Colonoscopy w/ polypectomy 2001    Family History  Problem Relation Age of Onset  . Heart disease Mother 56    MI  . Kidney disease Mother   . Heart disease Father 67    CHF  . Cancer Sister     breast, uterine  . Cancer Brother     colon  . Heart disease Brother     History   Social History  . Marital Status: Widowed    Spouse Name: N/A    Number of Children: N/A  . Years of Education: N/A   Occupational History  . Not on file.   Social History Main Topics  . Smoking status: Never Smoker   . Smokeless tobacco: Never Used  . Alcohol Use: Yes     1 glass of wine with dinner  . Drug Use: No  . Sexually Active: Not on file   Other Topics Concern  . Not on file   Social History Narrative  . No narrative on file     ROS: Please see the HPI.  All other systems reviewed and negative.  PHYSICAL EXAM:  BP 136/62  Pulse 69  Ht 5\' 6"  (1.676 m)  Wt 131 lb (59.421 kg)  BMI 21.14 kg/m2  SpO2 97%  General: Well developed, well nourished, in no acute distress. Head:  Normocephalic and atraumatic. Neck: no JVD Lungs: Clear to auscultation and percussion. Heart: Normal S1 and S2.  Soft SEM.  No DM.  Marland Kitchen  Abdomen:  Normal bowel sounds; soft; non tender; no organomegaly Pulses: Pulses normal in all 4 extremities. Extremities: No clubbing or cyanosis. No edema. Neurologic: Alert and oriented x 3.  EKG:  See tracing from July  ASSESSMENT AND PLAN:  I am not sure as to the etiology of these spells.  I have encouraged her to check her pulse and glucose levels.  If they worsen, she should see Dr. Caryl Never promptly.

## 2011-10-31 NOTE — Assessment & Plan Note (Signed)
Followed by Dr. Caryl Never.

## 2011-11-07 ENCOUNTER — Encounter: Payer: Self-pay | Admitting: Family Medicine

## 2011-11-07 ENCOUNTER — Ambulatory Visit (INDEPENDENT_AMBULATORY_CARE_PROVIDER_SITE_OTHER): Payer: Medicare Other | Admitting: Family Medicine

## 2011-11-07 VITALS — BP 142/52 | Temp 98.4°F | Wt 133.0 lb

## 2011-11-07 DIAGNOSIS — Z23 Encounter for immunization: Secondary | ICD-10-CM

## 2011-11-07 DIAGNOSIS — R350 Frequency of micturition: Secondary | ICD-10-CM

## 2011-11-07 LAB — POCT URINALYSIS DIPSTICK
Clarity, UA: NEGATIVE
Leukocytes, UA: NEGATIVE
Protein, UA: NEGATIVE
Urobilinogen, UA: 0.2

## 2011-11-07 NOTE — Progress Notes (Signed)
  Subjective:    Patient ID: [name], female    DOB: November 06, 1920, 78 y.o.   MRN: [id number]  HPI  Patient is here with concern for possible UTI. No burning with urination but she's had some intermittent frequency. No fevers or chills. No nausea or vomiting. No vaginal discharge. Generally feels well overall. No further syncope. Blood sugar stable low 100s.   Review of Systems  Constitutional: Negative for fever and chills.  Genitourinary: Positive for dysuria and frequency. Negative for hematuria, flank pain, vaginal bleeding, vaginal discharge and vaginal pain.       Objective:   Physical Exam  Constitutional: She appears well-developed and well-nourished.  Cardiovascular: Normal rate and regular rhythm.   Pulmonary/Chest: Effort normal and breath sounds normal. No respiratory distress. She has no wheezes. She has no rales.          Assessment & Plan:  Dysuria. No evidence for UTI by urine dipstick. Reassurance. She occasionally has sensation of not fully emptying bladder. She is not taking anti-cholinergic drugs. If symptoms persist consider urology referral

## 2011-11-08 ENCOUNTER — Ambulatory Visit: Payer: Medicare Other

## 2011-11-08 ENCOUNTER — Telehealth: Payer: Self-pay | Admitting: Family Medicine

## 2011-11-08 MED ORDER — ZOLPIDEM TARTRATE 10 MG PO TABS: 10.0000 mg | ORAL_TABLET | Freq: Every evening | ORAL | Status: DC | PRN

## 2011-11-08 NOTE — Telephone Encounter (Signed)
Handicap sticker OK.  OK to refill for 6 months.

## 2011-11-08 NOTE — Telephone Encounter (Signed)
Handicap plackard sent to home, Rx faxed to [facility]

## 2011-11-08 NOTE — Telephone Encounter (Signed)
Will assist with handicap need, ambien last filled 04-03-11, #90 with 1 refill

## 2011-11-08 NOTE — Telephone Encounter (Signed)
Patient called stating that she need an application for a handicap placard as her has expired. Please assist. Patient also need a refill of her [facility]ien [facility]. Please assist.

## 2011-11-21 LAB — HM MAMMOGRAPHY: HM Mammogram: NEGATIVE

## 2011-12-02 LAB — HM DEXA SCAN

## 2011-12-05 ENCOUNTER — Encounter: Payer: Self-pay | Admitting: Family Medicine

## 2011-12-25 ENCOUNTER — Telehealth: Payer: Self-pay | Admitting: Cardiology

## 2011-12-25 NOTE — Telephone Encounter (Signed)
She was wondering about the referral to a neurologist

## 2011-12-25 NOTE — Telephone Encounter (Signed)
I spoke with the pt and she complains of being awoken during the night with pain in her legs and a tingling sensation.  This has been on going for a while. The pt said she tried taking Tylenol last night for pain but this did not help.  The pt does have diabetes and this is managed by diet.  I did talk with the pt about peripheral neuropathy and also mentioned that she could have some restless leg syndrome.  I instructed the pt to speak with her PCP about her symptoms.  Pt agreed with plan.

## 2011-12-26 ENCOUNTER — Encounter: Payer: Self-pay | Admitting: Family Medicine

## 2011-12-26 ENCOUNTER — Ambulatory Visit: Payer: Medicare Other | Admitting: Family Medicine

## 2011-12-26 ENCOUNTER — Ambulatory Visit (INDEPENDENT_AMBULATORY_CARE_PROVIDER_SITE_OTHER): Payer: Medicare Other | Admitting: Family Medicine

## 2011-12-26 VITALS — BP 140/60 | Temp 97.5°F | Wt 134.0 lb

## 2011-12-26 DIAGNOSIS — M79609 Pain in unspecified limb: Secondary | ICD-10-CM

## 2011-12-26 DIAGNOSIS — M79671 Pain in right foot: Secondary | ICD-10-CM

## 2011-12-26 MED ORDER — GABAPENTIN 100 MG PO CAPS: 100.0000 mg | ORAL_CAPSULE | Freq: Every day | ORAL | Status: DC

## 2011-12-26 NOTE — Patient Instructions (Addendum)
Reduce ambien to one half tablet at night.

## 2011-12-26 NOTE — Progress Notes (Signed)
Subjective:    Patient ID: [name], female    DOB: [birth year], 78 y.o.   MRN: [id number]  HPI  Patient here for acute visit. Right foot pain off and on for the past few months possibly progressive. She describes a sharp pain mostly involving the third, possibly second and fourth toes. Pain occurs more at night. Moderate to severe at times. Relieved with pressure and walking. No claudication symptoms. No radiculopathy symptoms. She's not noted any obvious swelling or redness. No bruising. No history of injury. Symptoms are worse at night. She has not described any movement disorder to suggest restless leg type symptoms.  Denies lumbar back pain. Denies any change in shoe wear.  Past Medical History  Diagnosis Date  . COLONIC POLYPS, ADENOMATOUS 12/26/2009  . DIABETES MELLITUS, TYPE II 08/11/2006  . HYPERLIPIDEMIA 09/09/2006  . ANEMIA, NORMOCYTIC 11/30/2009  . HYPERTENSION 09/09/2006  . CAD, NATIVE VESSEL 04/27/2008    Cardiac catheterization 6/08: EF 55%, 1+ MR, Dx 50%, LAD 50%, dLAD 40%, OM1 50-60% (up to 70%), pOM 30%, pRCA 40%, mRCA 50-60%, pPDA 50-60% (up to 70%).  Medical therapy was recommended  . LEFT BUNDLE BRANCH BLOCK 08/11/2006  . Atrial flutter 04/12/2009  . TIA 09/02/2006    Echocardiogram 6/12: Mild to moderate MR, trace AI, trace TR, PASP 34, bubble study negative for intracardiac shunting, normal EF (greater than 55%)  . HEMORRHOIDS-INTERNAL 12/26/2009  . GERD 02/16/2007  . DIVERTICULOSIS, COLON 08/11/2006  . OSTEOARTHRITIS 08/11/2006  . OSTEOPOROSIS 08/11/2006  . Carotid stenosis     dopplers 6/12: LICA 50-79%   Past Surgical History  Procedure Date  . Tonsillectomy   . Eye surgery     catarac  . Closed reduction metatarsal fracture     right 5th  . Colonoscopy w/ polypectomy 2001    reports that she has never smoked. She has never used smokeless tobacco. She reports that she drinks alcohol. She reports that she does not use illicit drugs. family history includes  Cancer in her brother and sister; Heart disease in her brother; Heart disease (age of onset:72) in her mother; Heart disease (age of onset:93) in her father; and Kidney disease in her mother. Allergies  Allergen Reactions  . Doxycycline Hyclate     REACTION: upset stomach  . Nitrofurantoin     REACTION: unspecified  . Sulfamethoxazole W-Trimethoprim     REACTION: unspecified  . Sulfonamide Derivatives     REACTION: rash      Review of Systems  Constitutional: Negative for fever and chills.  Musculoskeletal: Negative for gait problem.  Hematological: Negative for adenopathy.       Objective:   Physical Exam  Constitutional: She appears well-developed and well-nourished.  Cardiovascular: Normal rate and regular rhythm.   Pulmonary/Chest: Effort normal and breath sounds normal.  Musculoskeletal: She exhibits no edema.       Feet are warm to touch. Good capillary refill. Patient has palpable dorsalis pedis and posterior tibial pulse on the right. No ecchymosis. No bony tenderness. No edema.  Neurological:       2+ knee and 1+ ankle reflexes bilaterally. Full-strength with plantar flexion, dorsiflexion, and knee extension.          Assessment & Plan:  Right foot pain. Question localized neuropathic pain. She's had diabetes but this has been very well controlled and suspect not related to this specifically. She does not have evidence for claudication symptoms. No evidence for bony problem with pain relieved with walking and foot  nontender.  Trial low-dose gabapentin 100 mg each bedtime and reassess one week

## 2012-01-03 ENCOUNTER — Encounter: Payer: Self-pay | Admitting: Family Medicine

## 2012-01-03 ENCOUNTER — Ambulatory Visit (INDEPENDENT_AMBULATORY_CARE_PROVIDER_SITE_OTHER): Payer: Medicare Other | Admitting: Family Medicine

## 2012-01-03 VITALS — BP 140/60 | Temp 97.6°F | Wt 135.0 lb

## 2012-01-03 DIAGNOSIS — G5790 Unspecified mononeuropathy of unspecified lower limb: Secondary | ICD-10-CM

## 2012-01-03 DIAGNOSIS — M792 Neuralgia and neuritis, unspecified: Secondary | ICD-10-CM

## 2012-01-03 MED ORDER — GABAPENTIN 100 MG PO CAPS: 100.0000 mg | ORAL_CAPSULE | Freq: Every day | ORAL | Status: DC

## 2012-01-03 NOTE — Progress Notes (Signed)
  Subjective:    Patient ID: [name], female    DOB: Jul 26, 1920, 78 y.o.   MRN: [id number]  HPI  Followup for recent foot pain. We suspected neuropathic type pain. She has diabetes which has been very well controlled. Her pain is worse at night and moderate to severe at times. Was frequently waking her up. She had nonfocal exam. Started gabapentin 100 mg at light in a very low dosage she's had almost total resolution of her foot pain. Other than some mild sedation she's not had any side effects.   Review of Systems  Constitutional: Negative for fever and chills.  Respiratory: Negative for shortness of breath.   Cardiovascular: Negative for chest pain.       Objective:   Physical Exam  Constitutional: She appears well-developed and well-nourished.  Cardiovascular: Normal rate and regular rhythm.   Pulmonary/Chest: Effort normal and breath sounds normal. No respiratory distress. She has no wheezes. She has no rales.  Musculoskeletal: She exhibits no edema.       Right foot is nontender to palpation. No edema. Good capillary refill          Assessment & Plan:  Foot pain greatly improved with low-dose gabapentin. She has neuropathic type pain which is very localized mostly to one toe and this has been almost resolved with very low-dose gabapentin which she'll continue at night only.

## 2012-01-20 ENCOUNTER — Encounter (INDEPENDENT_AMBULATORY_CARE_PROVIDER_SITE_OTHER): Payer: Medicare Other | Admitting: General Surgery

## 2012-01-21 ENCOUNTER — Ambulatory Visit (INDEPENDENT_AMBULATORY_CARE_PROVIDER_SITE_OTHER): Payer: Medicare Other | Admitting: General Surgery

## 2012-01-21 ENCOUNTER — Encounter (INDEPENDENT_AMBULATORY_CARE_PROVIDER_SITE_OTHER): Payer: Self-pay | Admitting: General Surgery

## 2012-01-21 VITALS — BP 152/60 | HR 76 | Temp 98.4°F | Resp 16 | Ht 66.0 in | Wt 132.8 lb

## 2012-01-21 DIAGNOSIS — K648 Other hemorrhoids: Secondary | ICD-10-CM

## 2012-01-21 MED ORDER — HYDROCORTISONE ACETATE 25 MG RE SUPP: 25.0000 mg | Freq: Two times a day (BID) | RECTAL | Status: DC

## 2012-01-21 NOTE — Progress Notes (Signed)
Chief Complaint  Patient presents with  . Rectal Problems    treat hems    HISTORY: Patient is a 78-year-old female whom I saw previously for back July. She was doing better follow-up but has had recurrence of bleeding and mucus.  She complains of the mucus having a very foul smell. She also states that when she has a bowel movement she has to push something back into her bottom.  She has been taking the stool softeners to feel like they have not been working as well. She's had increasing difficulty with her stools.   EXAM: Filed Vitals:   01/21/12 1502  BP: 152/60  Pulse: 76  Temp: 98.4 F (36.9 C)  Resp: 16    Gen:  No acute distress.  Well nourished and well groomed.   Neurological: Alert and oriented to person, place, and time. Coordination normal.  Head: Normocephalic and atraumatic.  Eyes: Conjunctivae are normal. Pupils are equal, round, and reactive to light. No scleral icterus.  Neck: Normal range of motion.   Respiratory: Effort normal.  No respiratory distress.  Rectal:  Small circumferential external hemorrhoids.  No thrombosis seen.  Anoscopy demonstrated large circumferential internal hemorrhoids that are injected x3. Small external hemorrhoids.    Musculoskeletal: Normal range of motion. Skin: Skin is warm and dry. No rash noted. No diaphoresis. No erythema. No pallor. No clubbing, cyanosis, or edema.   Psychiatric: Normal mood and affect. Behavior is normal. Judgment and thought content normal.    ASSESSMENT AND PLAN: HEMORRHOIDS-INTERNAL Reinjected internal hemorrhoids.    Given hemorrhoid instructions and teaching again.  Advised to add laxative.  Will write for suppositories.     Maudry Diego MD Surgical Oncology, General and Endocrine Surgery [facility]      Visit Diagnoses: 1. HEMORRHOIDS-INTERNAL     Primary Care Physician: Kristian Covey, MD

## 2012-01-21 NOTE — Patient Instructions (Addendum)
Add senekot stool softener and miralax laxative.      Hemorrhoids  Hemorrhoids are enlarged (dilated) veins around the rectum. There are 2 types of hemorrhoids, and the type of hemorrhoid is determined by its location. Internal hemorrhoids occur in the veins just inside the rectum.They are usually not painful, but they may bleed.However, they may poke through to the outside and become irritated and painful. External hemorrhoids involve the veins outside the anus and can be felt as a painful swelling or hard lump near the anus.They are often itchy and may crack and bleed. Sometimes clots will form in the veins. This makes them swollen and painful. These are called thrombosed hemorrhoids. CAUSES Causes of hemorrhoids include:  Pregnancy. This increases the pressure in the hemorrhoidal veins.   Constipation.   Straining to have a bowel movement.   Obesity.   Heavy lifting or other activity that caused you to strain.   TREATMENT Most of the time hemorrhoids improve in 1 to 2 weeks. However, if symptoms do not seem to be getting better or if you have a lot of rectal bleeding, your caregiver may perform a procedure to help make the hemorrhoids get smaller or remove them completely.Possible treatments include:  Rubber band ligation. A rubber band is placed at the base of the hemorrhoid to cut off the circulation.   Sclerotherapy. A chemical is injected to shrink the hemorrhoid.   Infrared light therapy. Tools are used to burn the hemorrhoid.   Hemorrhoidectomy. This is surgical removal of the hemorrhoid.   HOME CARE INSTRUCTIONS   Increase fiber in your diet. Ask your caregiver about using fiber supplements.   Drink enough water and fluids to keep your urine clear or pale yellow.   Exercise regularly.   Go to the bathroom when you have the urge to have a bowel movement. Do not wait.   Avoid straining to have bowel movements.   Keep the anal area dry and clean.   Only take  over-the-counter or prescription medicines for pain, discomfort, or fever as directed by your caregiver.   Take warm sitz baths for 20 to 30 minutes, 3 to 4 times per day.   If the hemorrhoids are very tender and swollen, place ice packs on the area as tolerated. Using ice packs between sitz baths may be helpful. Fill a plastic bag with ice. Place a towel between the bag of ice and your skin.   Medicated creams and suppositories may be used or applied as directed.   Do not use a donut-shaped pillow or sit on the toilet for long periods. This increases blood pooling and pain.   SEEK MEDICAL CARE IF:   You have increasing pain and swelling that is not controlled with your medicine.   You have uncontrolled bleeding.   You have difficulty or you are unable to have a bowel movement.   You have pain or inflammation outside the area of the hemorrhoids.   You have chills or an oral temperature above 102 F (38.9 C).

## 2012-01-21 NOTE — Assessment & Plan Note (Addendum)
Reinjected internal hemorrhoids.    Given hemorrhoid instructions and teaching again.  Advised to add laxative.  Will write for suppositories.

## 2012-01-29 ENCOUNTER — Telehealth: Payer: Self-pay | Admitting: Family Medicine

## 2012-01-29 NOTE — Telephone Encounter (Signed)
RN attempted to contact patient, line busy.  °

## 2012-01-30 ENCOUNTER — Telehealth: Payer: Self-pay | Admitting: Family Medicine

## 2012-01-30 NOTE — Telephone Encounter (Signed)
Patient Information:  Caller Name: [name]  Phone: [phone]  Patient: Desiree, [name]  Gender: Female  DOB: August 09, 1920  Age: 78 Years  PCP: [name] [facility])  Office Follow Up:  Does the office need to follow up with this patient?: No  Instructions For The Office: N/A  RN Note:  Patient was provided information on Shingles per Health Education. Understanding expressed.  Symptoms  Reason For Call & Symptoms: Patient states she is going to fly to [place].  She is going to be with someone who has Shingles  on her back . Should she be worried.?  Zoster Immunization given 05/2006 per EPIC  Reviewed Health History In EMR: Yes  Reviewed Medications In EMR: Yes  Reviewed Allergies In EMR: Yes  Reviewed Surgeries / Procedures: No  Date of Onset of Symptoms: 01/30/2012  Guideline(s) Used:  No Protocol Available - Information Only  Disposition Per Guideline:   Home Care  Reason For Disposition Reached:   Information only question and nurse able to answer  Advice Given:  Call Back If:  New symptoms develop  You become worse.

## 2012-01-30 NOTE — Telephone Encounter (Signed)
Pt called back. Was hoping to talk with someone yesterday. Connected her to [facility].

## 2012-01-30 NOTE — Telephone Encounter (Signed)
noted 

## 2012-02-13 ENCOUNTER — Encounter: Payer: Self-pay | Admitting: Family Medicine

## 2012-02-13 ENCOUNTER — Ambulatory Visit (INDEPENDENT_AMBULATORY_CARE_PROVIDER_SITE_OTHER): Payer: Medicare Other | Admitting: Family Medicine

## 2012-02-13 VITALS — BP 150/60 | Temp 98.0°F | Wt 134.0 lb

## 2012-02-13 DIAGNOSIS — R059 Cough, unspecified: Secondary | ICD-10-CM

## 2012-02-13 DIAGNOSIS — R05 Cough: Secondary | ICD-10-CM

## 2012-02-13 MED ORDER — CEFUROXIME AXETIL 250 MG PO TABS: 250.0000 mg | ORAL_TABLET | Freq: Two times a day (BID) | ORAL | Status: DC

## 2012-02-13 NOTE — Patient Instructions (Addendum)
Start Ceftin for any fever or worsening respiratory symptoms such as shortness of breath.

## 2012-02-13 NOTE — Progress Notes (Signed)
  Subjective:    Patient ID: [name], female    DOB: 1920-06-23, 79 y.o.   MRN: [id number]  HPI  Acute visit. 2 day history of sore throat, nasal congestion, and cough. No fever. She's had recent exacerbation of GERD symptoms. Patient is nonsmoker. She has allergies to doxycycline, nitrofurantoin, and sulfa. Denies any pleuritic pain. No dyspnea at rest. No nausea or vomiting. Received flu vaccine earlier this season.  Review of Systems As per history of present illness    Objective:   Physical Exam  Constitutional: She appears well-developed and well-nourished.  HENT:  Right Ear: External ear normal.  Left Ear: External ear normal.  Mouth/Throat: Oropharynx is clear and moist.  Neck: Neck supple.  Cardiovascular: Normal rate and regular rhythm.   Pulmonary/Chest: Effort normal. She has no wheezes.       She has a few faint basilar crackles which seem to clear with deep breathing  Musculoskeletal: She exhibits no edema.  Lymphadenopathy:    She has no cervical adenopathy.          Assessment & Plan:  Cough with suspected viral syndrome. At this point we've recommended plain Mucinex and plenty of fluids. No clear indication for antibiotic but if she develops any fever or persistent productive cough start Ceftin 250 mg twice a day for 10 days.

## 2012-02-24 ENCOUNTER — Ambulatory Visit: Payer: Medicare Other | Admitting: Cardiology

## 2012-03-05 ENCOUNTER — Encounter: Payer: Self-pay | Admitting: Cardiology

## 2012-03-05 ENCOUNTER — Ambulatory Visit (INDEPENDENT_AMBULATORY_CARE_PROVIDER_SITE_OTHER): Payer: Medicare Other | Admitting: Cardiology

## 2012-03-05 VITALS — BP 136/61 | HR 64 | Ht 66.0 in | Wt 130.1 lb

## 2012-03-05 DIAGNOSIS — I447 Left bundle-branch block, unspecified: Secondary | ICD-10-CM

## 2012-03-05 DIAGNOSIS — I2510 Atherosclerotic heart disease of native coronary artery without angina pectoris: Secondary | ICD-10-CM

## 2012-03-05 DIAGNOSIS — I4892 Unspecified atrial flutter: Secondary | ICD-10-CM

## 2012-03-05 DIAGNOSIS — I6529 Occlusion and stenosis of unspecified carotid artery: Secondary | ICD-10-CM

## 2012-03-05 DIAGNOSIS — E785 Hyperlipidemia, unspecified: Secondary | ICD-10-CM

## 2012-03-05 NOTE — Patient Instructions (Addendum)
Follow-up with Dr. Antoine Poche in 1 year  Continue your current medications as directed

## 2012-03-09 NOTE — Assessment & Plan Note (Signed)
She has no ischemic symptoms at the present time. She will remain on medical therapy.

## 2012-03-09 NOTE — Assessment & Plan Note (Signed)
No clinical evidence of recurrence.  Have been reluctant to consider anticoagulation.

## 2012-03-09 NOTE — Assessment & Plan Note (Signed)
On moderate dose therapy, and at target in prior guidelines.

## 2012-03-09 NOTE — Progress Notes (Signed)
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 Since I last saw her, she has no trips and falls. She's also had no sense that her heart is out of rhythm at anytime. She does notice at times that she has some pain in her feet bilaterally. She also said dry mouth. She denies any petechial rashes, or other major lower extremity findings. We reviewed in great detail today her anticoagulation situation, other possibilities, and her current functional status. I have encouraged her to consider possible options with regard to retirement facilities, and/or assisted living. The patient will be 89 in a few months.  Current Outpatient Prescriptions  Medication Sig Dispense Refill  . aspirin 81 MG tablet Take 81 mg by mouth daily.        . calcium carbonate 200 MG capsule Take 250 mg by mouth 2 (two) times daily with a meal.        . Cholecalciferol (VITAMIN D PO) Take 1 tablet by mouth daily.       . clopidogrel (PLAVIX) 75 MG tablet Take 1 tablet (75 mg total) by mouth daily.  90 tablet  3  . Cyanocobalamin (VITAMIN B 12 PO) Take 1 tablet by mouth daily.       Marland Kitchen docusate sodium (COLACE) 100 MG capsule Take 100 mg by mouth 2 (two) times daily.      . fish oil-omega-3 fatty acids 1000 MG capsule Take 1 g by mouth daily.       Marland Kitchen gabapentin (NEURONTIN) 100 MG capsule Take 1 capsule (100 mg total) by mouth at bedtime.  90 capsule  3  . glucose blood test strip Use as instructed daily  100 each  3  . hydrocortisone (ANUSOL-HC) 25 MG suppository Place 1 suppository (25 mg total) rectally 2 (two) times daily.  24 suppository  3  . metoprolol tartrate (LOPRESSOR) 25 MG tablet Take 0.5 tablets (12.5 mg total) by mouth 2 (two) times daily.  90 tablet  3  . Multiple Vitamin (MULTIVITAMIN) tablet Take 1 tablet by mouth daily.        . Multiple Vitamins-Minerals (OCUVITE ADULT 50+ PO) Take by mouth daily.        . ONE TOUCH LANCETS MISC Use daily as directed  200 each  3  . pantoprazole (PROTONIX)  40 MG tablet Take 20 mg by mouth every other day.        . simvastatin (ZOCOR) 40 MG tablet Take 1 tablet (40 mg total) by mouth at bedtime.  90 tablet  3  . zolpidem (AMBIEN) 10 MG tablet Take 1 tablet (10 mg total) by mouth at bedtime as needed. For sleep  90 tablet  1    Allergies  Allergen Reactions  . Doxycycline Hyclate     REACTION: upset stomach  . Nitrofurantoin     REACTION: unspecified  . Sulfamethoxazole W-Trimethoprim     REACTION: unspecified  . Sulfonamide Derivatives     REACTION: rash    Past Medical History  Diagnosis Date  . COLONIC POLYPS, ADENOMATOUS 12/26/2009  . DIABETES MELLITUS, TYPE II 08/11/2006  . HYPERLIPIDEMIA 09/09/2006  . ANEMIA, NORMOCYTIC 11/30/2009  . HYPERTENSION 09/09/2006  . CAD, NATIVE VESSEL 04/27/2008    Cardiac catheterization 6/08: EF 55%, 1+ MR, Dx 50%, LAD 50%, dLAD 40%, OM1 50-60% (up to 70%), pOM 30%, pRCA 40%, mRCA 50-60%, pPDA 50-60% (up to 70%).  Medical therapy was recommended  . LEFT BUNDLE BRANCH BLOCK 08/11/2006  . Atrial  flutter 04/12/2009  . TIA 09/02/2006    Echocardiogram 6/12: Mild to moderate MR, trace AI, trace TR, PASP 34, bubble study negative for intracardiac shunting, normal EF (greater than 55%)  . HEMORRHOIDS-INTERNAL 12/26/2009  . GERD 02/16/2007  . DIVERTICULOSIS, COLON 08/11/2006  . OSTEOARTHRITIS 08/11/2006  . OSTEOPOROSIS 08/11/2006  . Carotid stenosis     dopplers 6/12: LICA 50-79%    Past Surgical History  Procedure Date  . Tonsillectomy   . Eye surgery     catarac  . Closed reduction metatarsal fracture     right 5th  . Colonoscopy w/ polypectomy 2001    Family History  Problem Relation Age of Onset  . Heart disease Mother 68    MI  . Kidney disease Mother   . Heart disease Father 7    CHF  . Cancer Sister     breast, uterine  . Cancer Brother     colon  . Heart disease Brother     History   Social History  . Marital Status: Widowed    Spouse Name: N/A    Number of Children: N/A  .  Years of Education: N/A   Occupational History  . Not on file.   Social History Main Topics  . Smoking status: Never Smoker   . Smokeless tobacco: Never Used  . Alcohol Use: Yes     Comment: 1 glass of wine with dinner  . Drug Use: No  . Sexually Active: Not on file   Other Topics Concern  . Not on file   Social History Narrative  . No narrative on file    ROS: Please see the HPI.  All other systems reviewed and negative.  PHYSICAL EXAM:  BP 136/61  Pulse 64  Ht 5\' 6"  (1.676 m)  Wt 130 lb 1.9 oz (59.022 kg)  BMI 21.00 kg/m2  General: Delightful older woman looking younger than stated age.   Head:  Normocephalic and atraumatic. Neck: no JVD.  Carotid scar from surgery.   Lungs: Clear to auscultation and percussion. Heart: Normal S1 and S2.  No murmur, rubs or gallops.  Pulses: Pulses normal in all 4 extremities.  No petechiae.  Extremities: No clubbing or cyanosis. No edema. Neurologic: Alert and oriented x 3.  EKG:  NSR.  LBBB.   ASSESSMENT AND PLAN:

## 2012-03-09 NOTE — Assessment & Plan Note (Signed)
The patient's had successful carotid surgery. She had followup Dopplers done, and these were patent earlier in the year at [facility].  No current need for treatment.  Will have fu at [facility].

## 2012-03-10 ENCOUNTER — Telehealth: Payer: Self-pay | Admitting: Family Medicine

## 2012-03-10 NOTE — Telephone Encounter (Signed)
Patient Information:  Caller Name: [place]  Phone: [phone]  Patient: [name], [name]  Gender: Female  DOB: 05-Dec-1920  Age: 79 Years  PCP: [name] (Family Practice)  Office Follow Up:  Does the office need to follow up with this patient?: Yes  Instructions For The Office: Please advise follow up for lightheadness, vertigo and > BP.  RN Note:  BP 164/78 03/09/12 and 140/98 L arm at noon 03/10/12,  No headache.  Woke up with nosebleed 03/10/12. Fasting blood sugar 123. Friend suggested NetiPot to reduce dizziness so she used it and feels better.  Dizziness currently gone when ambulatory or if lies down.  However, felt lighheaded when got up quickly to check for dizziness. Due to health history, offered and declined appointment.  Requests call back with follow up directions from MD.  Symptoms  Reason For Call & Symptoms: Dizziness, and feels "funny in the head."  Reviewed Health History In EMR: Yes  Reviewed Medications In EMR: Yes  Reviewed Allergies In EMR: Yes  Reviewed Surgeries / Procedures: Yes  Date of Onset of Symptoms: 03/08/2012  Treatments Tried: Neti Pot  Treatments Tried Worked: Yes  Guideline(s) Used:  High Blood Pressure  Disposition Per Guideline:   Discuss with PCP and Callback by Nurse Today  Reason For Disposition Reached:   Taking BP medications and feels is having side effects (e.g., impotence, cough, dizziness)  Advice Given:  General:  Untreated high blood pressure may cause damage to the heart, brain, kidneys, and eyes.  Treatment of high blood pressure can reduce the risk of stroke, heart attack, and heart failure.  The goal of blood pressure treatment for most patients with hypertension is to keep the blood pressure under 140/90.  Call Back If:  Headache, blurred vision, difficulty talking, or difficulty walking occurs  Chest pain or difficulty breathing occurs  You want to go in to the office for a blood pressure check  You become  worse.

## 2012-03-11 NOTE — Telephone Encounter (Signed)
Sounds like vertigo.  NOT likely that blood sugar or blood pressure are contributing to her symptoms.  We are happy to see her but these symptoms are usually self limited.  If no speech changes, focal weakness, severe HA, would observe for a couple of days and they will probably resolve on their own.

## 2012-03-11 NOTE — Telephone Encounter (Signed)
Pt informed and voiced her understanding 

## 2012-03-11 NOTE — Telephone Encounter (Signed)
Pt reports 8:45 am today her BP was 126/84, fasting BS Mon. 146, Tues. 123, Today 123.  When she lies down at nightime for example she feels like the room is spinning.  These sx started Sunday.  Denies ears feeling full, denies nausea, feels fine other that intermittent "room spinning".  Her driveway is full of snow, however the road looks fine. Please advise

## 2012-03-16 ENCOUNTER — Telehealth: Payer: Self-pay | Admitting: Family Medicine

## 2012-03-16 NOTE — Telephone Encounter (Signed)
Patient Information:  Caller Name: [place]  Phone: [phone]  Patient: [name], [name]  Gender: Female  DOB: Jul 13, 1920  Age: 79 Years  PCP: [name] (Family Practice)  Office Follow Up:  Does the office need to follow up with this patient?: No  Instructions For The Office: N/A   Symptoms  Reason For Call & Symptoms: Started with dizziness on 03/08/12. Rooms spins whenever she lays down and she is feeling pulse beating in temple. One night this past week she woke up feeling an anxiety attack and felt pulses beating all over her body. BP = 122/65 this morning and 122/72 at 16:30-03/16/12. No dizziness during the day. She is drinking plenty of fluids. Using [employer].   Reviewed Health History In EMR: Yes  Reviewed Medications In EMR: Yes  Reviewed Allergies In EMR: Yes  Reviewed Surgeries / Procedures: Yes  Date of Onset of Symptoms: 03/08/2012  Guideline(s) Used:  Dizziness  Disposition Per Guideline:   See Provider within 24 hours  Reason For Disposition Reached:  Dizziness not responding to home care  Advice Given:  To get up slowly  Appointment Scheduled:  03/17/2012 12:00:00 Appointment Scheduled Provider:  Kelle Darting [facility])

## 2012-03-17 ENCOUNTER — Ambulatory Visit: Payer: Self-pay | Admitting: Family Medicine

## 2012-03-17 ENCOUNTER — Telehealth: Payer: Self-pay | Admitting: Cardiology

## 2012-03-17 NOTE — Telephone Encounter (Signed)
New Problem     Pt c/o dizziness and anxiety attacks. Pt would like to speak to a nurse. PCP was contacted, advised to get over the counter meds.

## 2012-03-17 NOTE — Telephone Encounter (Signed)
Spoke with patient and she is going to try OTC Meclizine and call back as needed.

## 2012-03-17 NOTE — Telephone Encounter (Signed)
Pt states that she has had vertigo for 2 days.  Advised to take meclizine by Dr. Caryl Never.  Pt has still not started meclizine.  Advised pt to start the meclizine as ordered, drink plenty of fluids.  Pt agreed.

## 2012-03-23 ENCOUNTER — Encounter: Payer: Self-pay | Admitting: Internal Medicine

## 2012-03-23 ENCOUNTER — Telehealth (INDEPENDENT_AMBULATORY_CARE_PROVIDER_SITE_OTHER): Payer: Self-pay | Admitting: General Surgery

## 2012-03-23 ENCOUNTER — Ambulatory Visit (INDEPENDENT_AMBULATORY_CARE_PROVIDER_SITE_OTHER): Payer: Medicare Other | Admitting: Internal Medicine

## 2012-03-23 ENCOUNTER — Telehealth: Payer: Self-pay | Admitting: Family Medicine

## 2012-03-23 VITALS — BP 156/70 | HR 86 | Temp 97.5°F | Wt 133.0 lb

## 2012-03-23 DIAGNOSIS — I2510 Atherosclerotic heart disease of native coronary artery without angina pectoris: Secondary | ICD-10-CM

## 2012-03-23 DIAGNOSIS — R42 Dizziness and giddiness: Secondary | ICD-10-CM

## 2012-03-23 DIAGNOSIS — I6529 Occlusion and stenosis of unspecified carotid artery: Secondary | ICD-10-CM

## 2012-03-23 DIAGNOSIS — E119 Type 2 diabetes mellitus without complications: Secondary | ICD-10-CM

## 2012-03-23 DIAGNOSIS — D649 Anemia, unspecified: Secondary | ICD-10-CM

## 2012-03-23 LAB — BASIC METABOLIC PANEL
Calcium: 9.4 mg/dL (ref 8.4–10.5)
GFR: 51.78 mL/min — ABNORMAL LOW (ref >60.00)
Glucose, Bld: 104 mg/dL — ABNORMAL HIGH (ref 70–99)
Potassium: 5.1 mEq/L (ref 3.5–5.1)
Sodium: 138 mEq/L (ref 135–145)

## 2012-03-23 LAB — CBC WITH DIFFERENTIAL/PLATELET
Basophils Absolute: 0 10*3/uL (ref 0.0–0.1)
Basophils Relative: 0.3 % (ref 0.0–3.0)
Eosinophils Relative: 2.2 % (ref 0.0–5.0)
HCT: 35 % — ABNORMAL LOW (ref 36.0–46.0)
Hemoglobin: 11.6 g/dL — ABNORMAL LOW (ref 12.0–15.0)
Lymphocytes Relative: 45.3 % (ref 12.0–46.0)
Lymphs Abs: 4.1 10*3/uL — ABNORMAL HIGH (ref 0.7–4.0)
Monocytes Relative: 9.3 % (ref 3.0–12.0)
Neutro Abs: 3.9 10*3/uL (ref 1.4–7.7)
RBC: 3.56 Mil/uL — ABNORMAL LOW (ref 3.87–5.11)
RDW: 13.2 % (ref 11.5–14.6)
WBC: 9 10*3/uL (ref 4.5–10.5)

## 2012-03-23 LAB — HEPATIC FUNCTION PANEL
Alkaline Phosphatase: 32 U/L — ABNORMAL LOW (ref 39–117)
Bilirubin, Direct: 0.1 mg/dL (ref 0.0–0.3)
Total Bilirubin: 0.6 mg/dL (ref 0.3–1.2)
Total Protein: 7.7 g/dL (ref 6.0–8.3)

## 2012-03-23 NOTE — Progress Notes (Signed)
Chief Complaint  Patient presents with  . Dizziness    Ongoing for 2 weeks    HPI: Patient comes in today for SDA for   problem evaluation.PCP not in office today . However this sleep began about 2 weeks ago when she got up first thing in the morning and had dizziness lightheadedness spinning room. No associated vision symptoms or hearing symptoms question if she had a headache. No fever.      PCP sent in some emdication that didn't help but made her drowsy.  See phone messages   Dr. Tawanna Cooler told her she could get the medication over-the-counter. It didn't really help her and just made her drowsy. In the meantime her primary care physician is out of the office. She comes in today because it is persisting and been very difficult and her family's encouraged her to come. She denies any falling major hearing changes or evidence.   She has known carotid disease status post surgery vascular disease history of atrial fib flutter. She also takes Ambien every night and gabapentin at night. She states that hasn't changed in didn't make her dizzy in the past.  Has seen n Dr Riley Kill  in th past few week and  hx of AFFLUTTER stable.    She is noted that symptoms are worse when she tries to lay down and it is  And  Hard to lay  Down  Last night  Propped up head and went slowly an ddidnt do as bad. She states that the problem seems to be on the right side and tends to be her that way tiggered when moves headWas able to play bridge   And felt tired  Some HA today. Onset with spinning  And now  Feels not as bad but feels like going to be worse at time.   No fever but has had  Had bloody nose and anxiety attack.       Takes ambien at night      Every night .   History of TIAs negative history of this. Drove herself in today. ROS: See pertinent positives and negatives per HPI. No diplopia numbness or focal weakness  Past Medical History  Diagnosis Date  . COLONIC POLYPS, ADENOMATOUS 12/26/2009  . DIABETES  MELLITUS, TYPE II 08/11/2006  . HYPERLIPIDEMIA 09/09/2006  . ANEMIA, NORMOCYTIC 11/30/2009  . HYPERTENSION 09/09/2006  . CAD, NATIVE VESSEL 04/27/2008    Cardiac catheterization 6/08: EF 55%, 1+ MR, Dx 50%, LAD 50%, dLAD 40%, OM1 50-60% (up to 70%), pOM 30%, pRCA 40%, mRCA 50-60%, pPDA 50-60% (up to 70%).  Medical therapy was recommended  . LEFT BUNDLE BRANCH BLOCK 08/11/2006  . Atrial flutter 04/12/2009  . TIA 09/02/2006    Echocardiogram 6/12: Mild to moderate MR, trace AI, trace TR, PASP 34, bubble study negative for intracardiac shunting, normal EF (greater than 55%)  . HEMORRHOIDS-INTERNAL 12/26/2009  . GERD 02/16/2007  . DIVERTICULOSIS, COLON 08/11/2006  . OSTEOARTHRITIS 08/11/2006  . OSTEOPOROSIS 08/11/2006  . Carotid stenosis     dopplers 6/12: LICA 50-79%    Family History  Problem Relation Age of Onset  . Heart disease Mother 69    MI  . Kidney disease Mother   . Heart disease Father 31    CHF  . Cancer Sister     breast, uterine  . Cancer Brother     colon  . Heart disease Brother     History   Social History  . Marital Status: Widowed  Spouse Name: N/A    Number of Children: N/A  . Years of Education: N/A   Social History Main Topics  . Smoking status: Never Smoker   . Smokeless tobacco: Never Used  . Alcohol Use: Yes     Comment: 1 glass of wine with dinner  . Drug Use: No  . Sexually Active: None   Other Topics Concern  . None   Social History Narrative  . None    Outpatient Encounter Prescriptions as of 03/23/2012  Medication Sig Dispense Refill  . aspirin 81 MG tablet Take 81 mg by mouth daily.        . calcium carbonate 200 MG capsule Take 250 mg by mouth 2 (two) times daily with a meal.        . Cholecalciferol (VITAMIN D PO) Take 1 tablet by mouth daily.       . clopidogrel (PLAVIX) 75 MG tablet Take 1 tablet (75 mg total) by mouth daily.  90 tablet  3  . Cyanocobalamin (VITAMIN B 12 PO) Take 1 tablet by mouth daily.       . fish oil-omega-3 fatty  acids 1000 MG capsule Take 1 g by mouth daily.       Marland Kitchen gabapentin (NEURONTIN) 100 MG capsule Take 1 capsule (100 mg total) by mouth at bedtime.  90 capsule  3  . glucose blood test strip Use as instructed daily  100 each  3  . hydrocortisone (ANUSOL-HC) 25 MG suppository Place 1 suppository (25 mg total) rectally 2 (two) times daily.  24 suppository  3  . metoprolol tartrate (LOPRESSOR) 25 MG tablet Take 0.5 tablets (12.5 mg total) by mouth 2 (two) times daily.  90 tablet  3  . Multiple Vitamin (MULTIVITAMIN) tablet Take 1 tablet by mouth daily.        . Multiple Vitamins-Minerals (OCUVITE ADULT 50+ PO) Take by mouth daily.        . ONE TOUCH LANCETS MISC Use daily as directed  200 each  3  . pantoprazole (PROTONIX) 40 MG tablet Take 20 mg by mouth every other day.        . simvastatin (ZOCOR) 40 MG tablet Take 1 tablet (40 mg total) by mouth at bedtime.  90 tablet  3  . zolpidem (AMBIEN) 10 MG tablet Take 1 tablet (10 mg total) by mouth at bedtime as needed. For sleep  90 tablet  1  . docusate sodium (COLACE) 100 MG capsule Take 100 mg by mouth 2 (two) times daily.       No facility-administered encounter medications on file as of 03/23/2012.    EXAM:  BP 156/70  Pulse 86  Temp(Src) 97.5 F (36.4 C) (Oral)  Wt 133 lb (60.328 kg)  BMI 21.48 kg/m2  SpO2 96%  Body mass index is 21.48 kg/(m^2).  GENERAL: vitals reviewed and listed above, alert, oriented, appears well hydrated and in no acute distress but got more distressed by the end of the visit because she felt more dizzy than usual than at the beginning of the visit. Feels uncomfortabel when moving and walking stiff  No wide based gait .ataxia . Speech and cognition appear to be normal  HEENT: atraumatic, conjunctiva  clear, no obvious abnormalities on inspection of external nose and ears eoms nl  OP : no lesion edema or exudate  No lesion tongue midline ears without acute abnormality.  NECK: no obvious masses on inspection palpation   No bruit heard ( hx of carotid surgery)  LUNGS: clear to auscultation bilaterally, no wheezes, rales or rhonchi, good air movement  CV: HRRR, no clubbing cyanosis or  peripheral edema nl cap refill   MS: moves all extremities without noticeable focal  Abnormality laying back very dizzy  And sitting but no nustagmus noted pulse rr .  Neurologically cranial nerves III through XII appear intact DTRs are present. No tremor drift Romberg negative although feels unsteady like she's going to the right. PSYCH: pleasant and cooperative, no obvious depression  does appear bit anxious. No tremor.  ASSESSMENT AND PLAN:  Discussed the following assessment and plan:  1. Vertigo  Ambulatory referral to Neurology   Ambulatory referral to Neurology   Basic metabolic panel   CBC with Differential   Hepatic function panel   TSH   Hemoglobin A1c   for 2 weeks   hx og vascular diseas and high risk med but no falles and no other obv focal changes . get ent or neuro to see.   2. Carotid stenosis  Ambulatory referral to Neurology   Ambulatory referral to Neurology   Basic metabolic panel   CBC with Differential   Hepatic function panel   TSH   Hemoglobin A1c  3. DIABETES MELLITUS, TYPE II  Ambulatory referral to Neurology   Ambulatory referral to Neurology   Basic metabolic panel   CBC with Differential   Hepatic function panel   TSH   Hemoglobin A1c  4. CAD, NATIVE VESSEL  Ambulatory referral to Neurology   Ambulatory referral to Neurology   Basic metabolic panel   CBC with Differential   Hepatic function panel   TSH   Hemoglobin A1c  5. ANEMIA, NORMOCYTIC  Ambulatory referral to Neurology   Ambulatory referral to Neurology   Basic metabolic panel   CBC with Differential   Hepatic function panel   TSH   Hemoglobin A1c   patient is uncomfortable today but her exam is non-alarming . She denies acute vision change has had a history of TIA and carotid surgery vascular disease diabetes. Don't  see evidence of a sinusitis this could be peripheral vestibulitis however it appears to be persistent with exacerbation.  She has seen neurology in her meds past but doesn't have an active relationship at this time. We'll plan referral assessment as soon as possible caution with medications. Depending on evaluation consider vestibular rehabilitation no matter what the cause. She is a bit overdue on her labs today hemoglobin A1c done more than 6 months ago although she states her blood sugars of been pretty good 140 today. Check for anemia thyroid metabolic etc. today.  -Patient advised to return or notify health care team  if symptoms worsen or persist or new concerns arise.  Patient Instructions  I agree this acts like vertigo  Uncertain   Cause although positional vertigo may be a possibility and  This can get better with physical therapy .  However ou have had strokes and tias because of vascular disease  ; would ask specialty evaluation neuro  to see you  Because not getting better and  Problematic at this time.  Medications such as gabapentin and Ambien can cause these sx but since your have been on these for a while  It doesn't seem related.  Fortunately I dont see  Other problems on your exam today .       Neta Mends. Raylie Maddison M.D.  We discussed driving risk today ;  states that she feels is okay for her to drive that  she's feeling better. Told her she should not drive if she has acute vertigo. Her gait appeared pretty steady when she left.

## 2012-03-23 NOTE — Patient Instructions (Addendum)
I agree this acts like vertigo  Uncertain   Cause although positional vertigo may be a possibility and  This can get better with physical therapy .  However ou have had strokes and tias because of vascular disease  ; would ask specialty evaluation neuro  to see you  Because not getting better and  Problematic at this time.  Medications such as gabapentin and Ambien can cause these sx but since your have been on these for a while  It doesn't seem related.  Fortunately I dont see  Other problems on your exam today .

## 2012-03-23 NOTE — Telephone Encounter (Signed)
Caller: [name]/Patient; Phone: [phone]; Reason for Call: Pt has been trying to see Dr Caryl Never for vertigo; she had appt w/Dr Tawanna Cooler but was called and told to pick up Rx from pharmacy.  This med only makes her groggy.  Please call her as soon as possible to discuss referral to Neurologist; pt advised she would call back if she did not receive call in 5 min, as she is very anxious to be seen today by someone.

## 2012-03-23 NOTE — Telephone Encounter (Signed)
Pt called to report that she had been sick for several days and became constipated. She had a hard stool yesterday followed by some blood in toilet with clot noted. She took her Metamucil and used Anusol Supp several times yesterday. No blood noted today. She did have a loose/ liquid bowel movement this am that was brown in appearance/ I reviewed this with Dr. Donell Beers and she said for pt to continue bowel regime and see in office on Friday unless bleeding recurrs, or other symptoms develop. Pt advised to call office if symptoms increase/ I scheduled an appt with Dr. Donell Beers on 03-27-12//gy

## 2012-03-23 NOTE — Telephone Encounter (Signed)
Pt was scheduled with Dr Fabian Sharp today

## 2012-03-27 ENCOUNTER — Ambulatory Visit (INDEPENDENT_AMBULATORY_CARE_PROVIDER_SITE_OTHER): Payer: Medicare Other | Admitting: General Surgery

## 2012-04-01 NOTE — Progress Notes (Signed)
Quick Note:  Called and spoke with pt and pt is aware. Pt would like to know if there is something she can take for anemia? Pls advise. ______

## 2012-04-06 NOTE — Progress Notes (Signed)
Quick Note:  Pt informed and had several questions/concerns. I encouraged her to monitor and document her symptoms and scheduled 3 month ROV in March sometime. She voiced her understanding. ______

## 2012-04-09 ENCOUNTER — Ambulatory Visit (INDEPENDENT_AMBULATORY_CARE_PROVIDER_SITE_OTHER): Payer: Medicare Other | Admitting: General Surgery

## 2012-04-09 ENCOUNTER — Telehealth (INDEPENDENT_AMBULATORY_CARE_PROVIDER_SITE_OTHER): Payer: Self-pay | Admitting: General Surgery

## 2012-04-09 NOTE — Telephone Encounter (Signed)
Please call patient back for re-scheduling her apt with Dr Donell Beers. She stated that she could no come in today. Please call her back on her home number

## 2012-04-21 ENCOUNTER — Ambulatory Visit (INDEPENDENT_AMBULATORY_CARE_PROVIDER_SITE_OTHER): Payer: Medicare Other | Admitting: General Surgery

## 2012-04-21 ENCOUNTER — Encounter (INDEPENDENT_AMBULATORY_CARE_PROVIDER_SITE_OTHER): Payer: Self-pay | Admitting: General Surgery

## 2012-04-21 VITALS — BP 148/82 | HR 87 | Temp 98.6°F | Resp 18 | Ht 66.5 in | Wt 129.4 lb

## 2012-04-21 DIAGNOSIS — K648 Other hemorrhoids: Secondary | ICD-10-CM

## 2012-04-21 DIAGNOSIS — K644 Residual hemorrhoidal skin tags: Secondary | ICD-10-CM

## 2012-04-21 NOTE — Assessment & Plan Note (Signed)
Pt was advised  to get her stools regulated. She is advised to take 100 mg of Colace twice daily.  She also is advised to take fiber supplements twice a day. The patient was instructed that if she becomes constipated again that the hemorrhoids will almost always flare back up.

## 2012-04-21 NOTE — Progress Notes (Signed)
Chief Complaint  Patient presents with  . Routine Post Op    blood in stool    HISTORY: Patient is an 79 year old female whom I saw previously in December. At that time I injected circumferential internal hemorrhoids.  She was doing well until she developed constipation again.  She had a toilet bowl full of blood and felt something come out of her bottom.  She accidentally bought the 50 mg colace, and it didn't seem to help.  She took 3, and then had runny stools.  The bleeding has improved, but she still feels uncomfortable.  She takes fiber when she gets constipated.  She had an appointment that got cancelled for snow and she had dizziness and migraine for another appt.   EXAM: Filed Vitals:   04/21/12 1422  BP: 148/82  Pulse: 87  Temp: 98.6 F (37 C)  Resp: 18    Gen:  No acute distress.  Well nourished and well groomed.   Neurological: Alert and oriented to person, place, and time. Coordination normal.  Head: Normocephalic and atraumatic.  Eyes: Conjunctivae are normal. Pupils are equal, round, and reactive to light. No scleral icterus.  Neck: Normal range of motion.   Respiratory: Effort normal.  No respiratory distress.  Rectal:  Small circumferential external hemorrhoids.  No thrombosis seen.  Anoscopy demonstrated large  internal hemorrhoids primarily posteriorly and right lateral.  These are injected.   Musculoskeletal: Normal range of motion. Skin: Skin is warm and dry. No rash noted. No diaphoresis. No erythema. No pallor. No clubbing, cyanosis, or edema.   Psychiatric: Normal mood and affect. Behavior is normal. Judgment and thought content normal.    ASSESSMENT AND PLAN: Internal and external bleeding hemorrhoids Pt was advised  to get her stools regulated. She is advised to take 100 mg of Colace twice daily.  She also is advised to take fiber supplements twice a day. The patient was instructed that if she becomes constipated again that the hemorrhoids will almost  always flare back up.  Anusol suppositories with hemorrhoid flare.  Maudry Diego MD Surgical Oncology, General and Endocrine Surgery [facility]      Visit Diagnoses: 1. Internal and external bleeding hemorrhoids     Primary Care Physician: Kristian Covey, MD

## 2012-04-21 NOTE — Patient Instructions (Signed)
Take colace stool softener 100 mg twice daily.  Take fiber supplement twice daily.  Follow up as needed (around 6 months)

## 2012-04-22 ENCOUNTER — Other Ambulatory Visit: Payer: Self-pay | Admitting: Family Medicine

## 2012-04-24 ENCOUNTER — Other Ambulatory Visit: Payer: Self-pay

## 2012-04-24 DIAGNOSIS — R002 Palpitations: Secondary | ICD-10-CM

## 2012-04-29 ENCOUNTER — Encounter (INDEPENDENT_AMBULATORY_CARE_PROVIDER_SITE_OTHER): Payer: Medicare Other

## 2012-04-29 ENCOUNTER — Telehealth: Payer: Self-pay | Admitting: *Deleted

## 2012-04-29 ENCOUNTER — Encounter: Payer: Self-pay | Admitting: Cardiology

## 2012-04-29 DIAGNOSIS — R002 Palpitations: Secondary | ICD-10-CM

## 2012-04-29 NOTE — Telephone Encounter (Signed)
21 day event monitor placed on Pt 04/29/12 TK

## 2012-05-07 ENCOUNTER — Telehealth: Payer: Self-pay | Admitting: Family Medicine

## 2012-05-07 NOTE — Telephone Encounter (Signed)
Refill for 6 months OK

## 2012-05-07 NOTE — Telephone Encounter (Signed)
Ambien 10 mg last filled 11-08-11, #90 with 1 refill

## 2012-05-07 NOTE — Telephone Encounter (Signed)
Pt called and stated that she needed a refill of her zolpidem (AMBIEN) 10 MG tablet. She would like it called in through [facility] DELIVERY. Please assist.

## 2012-05-08 MED ORDER — ZOLPIDEM TARTRATE 10 MG PO TABS: 10.0000 mg | ORAL_TABLET | Freq: Every evening | ORAL | Status: DC | PRN

## 2012-05-13 ENCOUNTER — Ambulatory Visit: Payer: Medicare Other | Admitting: Cardiology

## 2012-05-14 ENCOUNTER — Other Ambulatory Visit: Payer: Self-pay | Admitting: Family Medicine

## 2012-05-27 ENCOUNTER — Encounter: Payer: Self-pay | Admitting: Cardiology

## 2012-05-27 ENCOUNTER — Ambulatory Visit (INDEPENDENT_AMBULATORY_CARE_PROVIDER_SITE_OTHER): Payer: Medicare Other | Admitting: Cardiology

## 2012-05-27 VITALS — BP 134/50 | HR 74 | Ht 66.5 in | Wt 127.0 lb

## 2012-05-27 DIAGNOSIS — I6522 Occlusion and stenosis of left carotid artery: Secondary | ICD-10-CM

## 2012-05-27 DIAGNOSIS — R42 Dizziness and giddiness: Secondary | ICD-10-CM

## 2012-05-27 DIAGNOSIS — G459 Transient cerebral ischemic attack, unspecified: Secondary | ICD-10-CM

## 2012-05-27 DIAGNOSIS — I2510 Atherosclerotic heart disease of native coronary artery without angina pectoris: Secondary | ICD-10-CM

## 2012-05-27 DIAGNOSIS — R011 Cardiac murmur, unspecified: Secondary | ICD-10-CM

## 2012-05-27 DIAGNOSIS — I6529 Occlusion and stenosis of unspecified carotid artery: Secondary | ICD-10-CM

## 2012-05-27 NOTE — Progress Notes (Signed)
HPI:  This very nice lady is in for followup visit today. She's had a couple of episodes of what sound like vertigo. She clearly saw Dr. Rolly Pancake in the office at [facility]. She was subtotally referred to neurology who said for for an event monitor. The patient's had prior documented atrial fibrillation, but has not had any documented fibrillation in quite some time. She's also not been aware of this. Initial review of her vent monitor revealed no evidence of significant atrial arrhythmias. She has occasional PAC and an interventricular conduction delay. Of note, the patient has been on aspirin and Plavix after having a TIA sometime in the remote past. She was not a good candidate for anticoagulation because of recurrent hemorrhoidal bleeding, although the dual antiplatelet therapy has not resulted in this. He does have some underlying coronary artery disease. She's not having anginal symptoms.  She has not had recurrent dizziness.  She has a carotid schedule at [facility].  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    Current Outpatient Prescriptions  Medication Sig Dispense Refill  . aspirin 81 MG tablet Take 81 mg by mouth daily.        . calcium carbonate 200 MG capsule Take 250 mg by mouth 2 (two) times daily with a meal.        . Cholecalciferol (VITAMIN D PO) Take 1 tablet by mouth daily.       . clopidogrel (PLAVIX) 75 MG tablet TAKE 1 TABLET DAILY  90 tablet  2  . Cyanocobalamin (VITAMIN B 12 PO) Take 1 tablet by mouth daily.       Marland Kitchen docusate sodium (COLACE) 100 MG capsule Take 100 mg by mouth 2 (two) times daily.      . fish oil-omega-3 fatty acids 1000 MG capsule Take 1 g by mouth daily.       Marland Kitchen gabapentin (NEURONTIN) 100 MG capsule Take 100 mg by mouth as needed.      Marland Kitchen glucose blood test strip Use as instructed once a week      . hydrocortisone (ANUSOL-HC) 25 MG suppository Place 25 mg rectally 2 (two) times daily as needed.      . metoprolol tartrate (LOPRESSOR) 25 MG tablet TAKE  ONE-HALF (1/2) TABLET (12.5 MG TOTAL) TWICE A DAY  90 tablet  2  . Multiple Vitamin (MULTIVITAMIN) tablet Take 1 tablet by mouth daily.        . Multiple Vitamins-Minerals (OCUVITE ADULT 50+ PO) Take by mouth daily.        . ONE TOUCH LANCETS MISC Use daily as directed  200 each  3  . pantoprazole (PROTONIX) 40 MG tablet Take 20 mg by mouth every other day.        . simvastatin (ZOCOR) 40 MG tablet TAKE 1 TABLET AT BEDTIME  90 tablet  2  . zolpidem (AMBIEN) 10 MG tablet Take 1 tablet (10 mg total) by mouth at bedtime as needed. For sleep  90 tablet  1   No current facility-administered medications for this visit.    Allergies  Allergen Reactions  . Doxycycline Hyclate     REACTION: upset stomach  . Nitrofurantoin     REACTION: unspecified  . Sulfamethoxazole W-Trimethoprim     REACTION: unspecified  . Sulfonamide Derivatives     REACTION: rash    Past Medical History  Diagnosis Date  . COLONIC POLYPS, ADENOMATOUS 12/26/2009  . DIABETES MELLITUS, TYPE II 08/11/2006  . HYPERLIPIDEMIA 09/09/2006  . ANEMIA,  NORMOCYTIC 11/30/2009  . HYPERTENSION 09/09/2006  . CAD, NATIVE VESSEL 04/27/2008    Cardiac catheterization 6/08: EF 55%, 1+ MR, Dx 50%, LAD 50%, dLAD 40%, OM1 50-60% (up to 70%), pOM 30%, pRCA 40%, mRCA 50-60%, pPDA 50-60% (up to 70%).  Medical therapy was recommended  . LEFT BUNDLE BRANCH BLOCK 08/11/2006  . Atrial flutter 04/12/2009  . TIA 09/02/2006    Echocardiogram 6/12: Mild to moderate MR, trace AI, trace TR, PASP 34, bubble study negative for intracardiac shunting, normal EF (greater than 55%)  . HEMORRHOIDS-INTERNAL 12/26/2009  . GERD 02/16/2007  . DIVERTICULOSIS, COLON 08/11/2006  . OSTEOARTHRITIS 08/11/2006  . OSTEOPOROSIS 08/11/2006  . Carotid stenosis     dopplers 6/12: LICA 50-79%    Past Surgical History  Procedure Laterality Date  . Tonsillectomy    . Eye surgery      catarac  . Closed reduction metatarsal fracture      right 5th  . Colonoscopy w/ polypectomy   2001    Family History  Problem Relation Age of Onset  . Heart disease Mother 48    MI  . Kidney disease Mother   . Heart disease Father 23    CHF  . Cancer Sister     breast, uterine  . Cancer Brother     colon  . Heart disease Brother     History   Social History  . Marital Status: Widowed    Spouse Name: N/A    Number of Children: N/A  . Years of Education: N/A   Occupational History  . Not on file.   Social History Main Topics  . Smoking status: Never Smoker   . Smokeless tobacco: Never Used  . Alcohol Use: Yes     Comment: 1 glass of wine with dinner  . Drug Use: No  . Sexually Active: Not on file   Other Topics Concern  . Not on file   Social History Narrative  . No narrative on file    ROS: Please see the HPI.  All other systems reviewed and negative.  PHYSICAL EXAM:  BP 134/50  Pulse 74  Ht 5' 6.5" (1.689 m)  Wt 127 lb (57.607 kg)  BMI 20.19 kg/m2  SpO2 90%  General: Well developed, well nourished, in no acute distress. Head:  Normocephalic and atraumatic. Neck: no JVD.  Soft carotid bruit.   Lungs: Clear to auscultation and percussion. Heart: Normal S1 and S2.  2/6 SEM, suggestive of mild AS.  No DM.   Pulses: Pulses normal in all 4 extremities. Extremities: No clubbing or cyanosis. No edema. Neurologic: Alert and oriented x 3.  EKG:  NSR.  LBBB.     ASSESSMENT AND PLAN:  1.  FU Dr. Tenny Craw 2.  RTC 3 months 3.  2 D echo 4.  Continue DAPT  --was started for TIA in the past.    Not a good candidate for antithrombin treatment.

## 2012-05-27 NOTE — Patient Instructions (Addendum)
NO CHANGES WERE MADE TODAY  PLEASE FOLLOW UP WITH DR. ROSS 08/25/12 @ 1:45  ECHO 06/01/12 @ 3 pm

## 2012-05-28 ENCOUNTER — Encounter (HOSPITAL_COMMUNITY): Payer: Self-pay | Admitting: Cardiology

## 2012-05-28 NOTE — Assessment & Plan Note (Signed)
She has continued to remain stable from a cardiac standpoint without angina.

## 2012-05-28 NOTE — Assessment & Plan Note (Signed)
During her original TIA, she had eval in the hospital and was placed on DAPT.  She has remained.  She also has some atrial fib in the past, but none documented recently.  She is 41 and has bleeding hemorrhoids, thought to be problematic for antithrombin treatment.  She has been stable since the original event.

## 2012-05-28 NOTE — Assessment & Plan Note (Signed)
Has fu on [facility] with prior endarterectomy site.  Due again soon.  TS

## 2012-05-28 NOTE — Assessment & Plan Note (Signed)
This sounded like an episode of vertigo, but it is being evaluated appropriately.  WU is in progress.

## 2012-06-01 ENCOUNTER — Ambulatory Visit (HOSPITAL_COMMUNITY): Payer: Medicare Other | Attending: Internal Medicine | Admitting: Radiology

## 2012-06-01 DIAGNOSIS — E785 Hyperlipidemia, unspecified: Secondary | ICD-10-CM | POA: Insufficient documentation

## 2012-06-01 DIAGNOSIS — I059 Rheumatic mitral valve disease, unspecified: Secondary | ICD-10-CM | POA: Insufficient documentation

## 2012-06-01 DIAGNOSIS — I079 Rheumatic tricuspid valve disease, unspecified: Secondary | ICD-10-CM | POA: Insufficient documentation

## 2012-06-01 DIAGNOSIS — I679 Cerebrovascular disease, unspecified: Secondary | ICD-10-CM | POA: Insufficient documentation

## 2012-06-01 DIAGNOSIS — R011 Cardiac murmur, unspecified: Secondary | ICD-10-CM | POA: Insufficient documentation

## 2012-06-01 DIAGNOSIS — E119 Type 2 diabetes mellitus without complications: Secondary | ICD-10-CM | POA: Insufficient documentation

## 2012-06-01 DIAGNOSIS — I2510 Atherosclerotic heart disease of native coronary artery without angina pectoris: Secondary | ICD-10-CM | POA: Insufficient documentation

## 2012-06-01 DIAGNOSIS — I379 Nonrheumatic pulmonary valve disorder, unspecified: Secondary | ICD-10-CM | POA: Insufficient documentation

## 2012-06-01 DIAGNOSIS — I10 Essential (primary) hypertension: Secondary | ICD-10-CM | POA: Insufficient documentation

## 2012-06-01 DIAGNOSIS — Z8673 Personal history of transient ischemic attack (TIA), and cerebral infarction without residual deficits: Secondary | ICD-10-CM | POA: Insufficient documentation

## 2012-06-01 DIAGNOSIS — I447 Left bundle-branch block, unspecified: Secondary | ICD-10-CM | POA: Insufficient documentation

## 2012-06-01 DIAGNOSIS — I4892 Unspecified atrial flutter: Secondary | ICD-10-CM | POA: Insufficient documentation

## 2012-06-01 NOTE — Progress Notes (Signed)
Echocardiogram performed.  

## 2012-06-03 ENCOUNTER — Other Ambulatory Visit: Payer: Self-pay | Admitting: *Deleted

## 2012-06-03 DIAGNOSIS — Z48812 Encounter for surgical aftercare following surgery on the circulatory system: Secondary | ICD-10-CM

## 2012-06-11 ENCOUNTER — Ambulatory Visit: Payer: Medicare Other | Admitting: Neurosurgery

## 2012-06-11 ENCOUNTER — Other Ambulatory Visit (INDEPENDENT_AMBULATORY_CARE_PROVIDER_SITE_OTHER): Payer: Medicare Other | Admitting: *Deleted

## 2012-06-11 DIAGNOSIS — I6529 Occlusion and stenosis of unspecified carotid artery: Secondary | ICD-10-CM

## 2012-06-11 DIAGNOSIS — Z48812 Encounter for surgical aftercare following surgery on the circulatory system: Secondary | ICD-10-CM

## 2012-06-16 ENCOUNTER — Encounter: Payer: Self-pay | Admitting: Vascular Surgery

## 2012-06-16 ENCOUNTER — Other Ambulatory Visit: Payer: Self-pay | Admitting: *Deleted

## 2012-06-16 ENCOUNTER — Ambulatory Visit (INDEPENDENT_AMBULATORY_CARE_PROVIDER_SITE_OTHER): Payer: Medicare Other | Admitting: Nurse Practitioner

## 2012-06-16 ENCOUNTER — Encounter: Payer: Self-pay | Admitting: Nurse Practitioner

## 2012-06-16 VITALS — BP 143/68 | HR 81 | Ht 67.0 in | Wt 128.0 lb

## 2012-06-16 DIAGNOSIS — R42 Dizziness and giddiness: Secondary | ICD-10-CM

## 2012-06-16 DIAGNOSIS — Z48812 Encounter for surgical aftercare following surgery on the circulatory system: Secondary | ICD-10-CM

## 2012-06-16 DIAGNOSIS — H8110 Benign paroxysmal vertigo, unspecified ear: Secondary | ICD-10-CM

## 2012-06-16 NOTE — Progress Notes (Addendum)
HPI: Patient returns for followup after evaluation 02/12/2014with  Dr. Vickey Huger for vertigo. Patient had a sudden onset on a Sunday morning. She was so unsteady she skipped church. She describes the room as spinning around her. She denies any  further vertigo episodes on today's visit. MRI of the brain demonstrates mild temporal atrophy and mild to moderate small vessel ischemic disease but no acute findings. She had 2 D echo and she was told that was not the source of her symptoms. She did not go to vestibular rehabilitation.    ROS:  negative  Physical Exam General: well developed, well nourished, seated, in no evident distress Head: head normocephalic and atraumatic. Oropharynx benign Neck: supple with no carotid or supraclavicular bruits Cardiovascular: regular rate and rhythm, no murmurs  Neurologic Exam Mental Status: Awake and fully alert. Oriented to place and time. Recent and remote memory intact. Attention span, concentration and fund of knowledge appropriate. Mood and affect appropriate.  Cranial Nerves: Fundoscopic exam reveals sharp disc margins. Pupils equal, briskly reactive to light. Extraocular movements full without nystagmus. Visual fields full to confrontation. Hearing intact and symmetric to finger snap. Facial sensation intact. Face, tongue, palate move normally and symmetrically. Neck flexion and extension normal.  Motor: Normal bulk and tone. Normal strength in all tested extremity muscles. No focal weakness Sensory.: intact to touch and pinprick and vibratory.  Coordination: Rapid alternating movements normal in all extremities. Finger-to-nose and heel-to-shin performed accurately bilaterally. Gait and Station: Arises from chair without difficulty. Stance is normal. Gait demonstrates normal stride length and balance . Able to heel, toe and mildly unsteady with tandem walk.  Reflexes: 1+ and symmetric. Toes downgoing.     ASSESSMENT: Vertigo in a patient with history of  TIA. Currently on aspirin and Plavix. 2-D echo did not identify a source for her symptoms. EKG with normal sinus rhythm and left bundle branch block. Patient did not go to vestibular rehabilitation. MRI of the brain with mild temporal atrophy and chronic small vessel ischemic disease.   PLAN: Make sure you're well hydrated Patient has not had further episodes of dizziness Continue aspirin and Plavix for secondary stroke prevention  Followup when necessary   Nilda Riggs, GNP-BC APRN   I agree with Rolan Bucco and plan, Melvyn Novas, MD

## 2012-06-16 NOTE — Patient Instructions (Addendum)
MRI of the brain with nothing acute, copy to pt Make sure you're well hydrated Patient has not had further episodes of dizziness Followup when necessary

## 2012-06-18 ENCOUNTER — Telehealth: Payer: Self-pay | Admitting: Internal Medicine

## 2012-06-18 NOTE — Telephone Encounter (Signed)
I spoke with the pt and she states that she does not feel well.  The pt denies dizziness but has noticed that since Tuesday she has been having issues with remembering.  The pt said she went to the election on Tuesday and when pulling out of the parking lot she could not remember her way to the doctor office ([facility] appt).  The pt did make it to her appointment but she failed to mention this event to the doctor.  The pt feels like she is continuing to have issues with not being able to remember and cannot put her thoughts together.  I instructed the pt that she needs to contact [facility] with her symptoms to determine if they have any other recommendations.  The pt agreed with plan.

## 2012-06-18 NOTE — Telephone Encounter (Signed)
New problem   Added [name] as provider by [facility] was a dr Riley Kill pt  Pt thinks she had a TIA and says she doesn't feel right

## 2012-06-19 ENCOUNTER — Telehealth: Payer: Self-pay | Admitting: *Deleted

## 2012-06-19 NOTE — Telephone Encounter (Signed)
patient is calling , is having lightheadness, not dizzy, had a previous visit w/ Np, but forgot to mention,please call

## 2012-06-23 ENCOUNTER — Telehealth: Payer: Self-pay | Admitting: Neurology

## 2012-06-23 NOTE — Telephone Encounter (Signed)
Patient called on 06/19/2012, she had been in to see Jesse Fall, NP on 06/16/2012 and forgot to tell her that she had an episode of getting light headed. She wanted to relay the information. She only had one episode and none since. She was cocerned that she called on 05/09 but did not get a return call until after the weekend and wanted to know if she should call if she has another episode. I recommended that she call so we can have a record of events. Patient thanked me for the return call.  Su Ley BS RN

## 2012-06-24 NOTE — Telephone Encounter (Signed)
She can keep a record but she has been asked to do vestibular rehab in the past and she has not followed through.

## 2012-06-24 NOTE — Telephone Encounter (Signed)
I called pt and relayed about the vestibular rehab for her dizziness, and she said that she had a bunch of tests done and were normal. S he has not had another episode and did not want to do vestibular rehab.  At this time.

## 2012-08-03 ENCOUNTER — Telehealth: Payer: Self-pay | Admitting: *Deleted

## 2012-08-03 MED ORDER — PANTOPRAZOLE SODIUM 40 MG PO TBEC: 20.0000 mg | DELAYED_RELEASE_TABLET | ORAL | Status: DC

## 2012-08-03 MED ORDER — PANTOPRAZOLE SODIUM 40 MG PO TBEC: DELAYED_RELEASE_TABLET | ORAL | Status: DC

## 2012-08-03 NOTE — Telephone Encounter (Signed)
Rx request to pharmacy/[facility]  

## 2012-08-21 ENCOUNTER — Ambulatory Visit (INDEPENDENT_AMBULATORY_CARE_PROVIDER_SITE_OTHER): Payer: Medicare Other | Admitting: Family Medicine

## 2012-08-21 ENCOUNTER — Telehealth: Payer: Self-pay | Admitting: Internal Medicine

## 2012-08-21 ENCOUNTER — Telehealth: Payer: Self-pay | Admitting: Family Medicine

## 2012-08-21 ENCOUNTER — Telehealth: Payer: Self-pay

## 2012-08-21 ENCOUNTER — Encounter: Payer: Self-pay | Admitting: Family Medicine

## 2012-08-21 VITALS — BP 142/64 | HR 79 | Temp 97.8°F | Wt 133.0 lb

## 2012-08-21 DIAGNOSIS — R6889 Other general symptoms and signs: Secondary | ICD-10-CM

## 2012-08-21 DIAGNOSIS — I10 Essential (primary) hypertension: Secondary | ICD-10-CM

## 2012-08-21 NOTE — Progress Notes (Signed)
Subjective:    Patient ID: [name], female    DOB: [birth year], 79 y.o.   MRN: [id number]  HPI Acute visit. 79 year old female with history of TIA, osteoporosis, osteoarthritis, left bundle branch block, hypertension, hyperlipidemia, GERD, type 2 diabetes, and reported history of transient atrial flutter. She is here because of episode 2 days ago when she laid down to sleep of pulsating sensation bilateral neck. No associated pain. She had recent carotid Doppler in day which revealed less than 40% right internal carotid artery stenosis and widely patent left internal carotid artery.  She denies any recent headaches, blurred vision, focal weakness. No appetite or weight changes. No preceding alcohol or caffeine use. No decongestant use.  Patient was seen here in February with vertigo episode. She had extensive lab work at that time including TSH which was normal. Patient's had no further recurrent episodes since Wednesday night. Denies any palpitations. No chest pains. No dyspnea. No confusion. No recent speech changes No recent vertigo symptoms.  Past Medical History  Diagnosis Date  . COLONIC POLYPS, ADENOMATOUS 12/26/2009  . DIABETES MELLITUS, TYPE II 08/11/2006  . HYPERLIPIDEMIA 09/09/2006  . ANEMIA, NORMOCYTIC 11/30/2009  . HYPERTENSION 09/09/2006  . CAD, NATIVE VESSEL 04/27/2008    Cardiac catheterization 6/08: EF 55%, 1+ MR, Dx 50%, LAD 50%, dLAD 40%, OM1 50-60% (up to 70%), pOM 30%, pRCA 40%, mRCA 50-60%, pPDA 50-60% (up to 70%).  Medical therapy was recommended  . LEFT BUNDLE BRANCH BLOCK 08/11/2006  . Atrial flutter 04/12/2009  . TIA 09/02/2006    Echocardiogram 6/12: Mild to moderate MR, trace AI, trace TR, PASP 34, bubble study negative for intracardiac shunting, normal EF (greater than 55%)  . HEMORRHOIDS-INTERNAL 12/26/2009  . GERD 02/16/2007  . DIVERTICULOSIS, COLON 08/11/2006  . OSTEOARTHRITIS 08/11/2006  . OSTEOPOROSIS 08/11/2006  . Carotid stenosis     dopplers 6/12: LICA  50-79%   Past Surgical History  Procedure Laterality Date  . Tonsillectomy    . Eye surgery      catarac  . Closed reduction metatarsal fracture      right 5th  . Colonoscopy w/ polypectomy  2001    reports that she has never smoked. She has never used smokeless tobacco. She reports that  drinks alcohol. She reports that she does not use illicit drugs. family history includes Cancer in her brother and sister; Heart disease in her brother; Heart disease (age of onset: 87) in her mother; Heart disease (age of onset: 37) in her father; and Kidney disease in her mother. Allergies  Allergen Reactions  . Doxycycline Hyclate     REACTION: upset stomach  . Nitrofurantoin     REACTION: unspecified  . Sulfamethoxazole W-Trimethoprim     REACTION: unspecified  . Sulfonamide Derivatives     REACTION: rash      Review of Systems  Constitutional: Negative for fever, chills, appetite change and unexpected weight change.  HENT: Negative for trouble swallowing and voice change.   Eyes: Negative for visual disturbance.  Respiratory: Negative for cough and shortness of breath.   Cardiovascular: Negative for chest pain, palpitations and leg swelling.  Gastrointestinal: Negative for abdominal pain.  Neurological: Negative for dizziness, seizures, syncope, weakness and headaches.  Hematological: Negative for adenopathy.  Psychiatric/Behavioral: Negative for confusion.       Objective:   Physical Exam  Constitutional: She is oriented to person, place, and time. She appears well-developed and well-nourished.  HENT:  Mouth/Throat: Oropharynx is clear and moist.  Neck: Neck supple.  No thyromegaly present.  No carotid bruits  Cardiovascular: Normal rate and regular rhythm.   Pulmonary/Chest: Effort normal and breath sounds normal. No respiratory distress. She has no wheezes. She has no rales.  Musculoskeletal: She exhibits no edema.  Neurological: She is alert and oriented to person, place,  and time. No cranial nerve deficit.  Full-strength upper and lower extremities. Gait normal          Assessment & Plan:  Patient presents with transient pulsations bilateral neck. No concerning findings by history or exam. She denies any palpitations or sense of heart irregularity. No worrisome symptoms such as TIA symptoms or chest pains. Reassurance. She had recent carotid Dopplers as above which were normal.

## 2012-08-21 NOTE — Telephone Encounter (Signed)
New problem    Pt wants to know if she can be seen sooner than 09/21/12 since her appt was changed from 7/14

## 2012-08-21 NOTE — Telephone Encounter (Signed)
Patient has appointment at 4:30 today

## 2012-08-21 NOTE — Telephone Encounter (Signed)
Pt given an appt to see Tereso Newcomer, PA-C on September 04, 2012.

## 2012-08-21 NOTE — Telephone Encounter (Signed)
Pt. Called to report that 2 nights ago, she could feel her carotid arteries and her temples throbbing, and had difficulty sleeping.  States she thought she should make an appt. With Dr. Arbie Cookey.  Last carotid ultrasound 06/11/12; results showed <40% (R) ICA stenosis, and widely patent Left CEA without hyperplasia or restenosis.  Questioned pt. About seeing her PCP for evaluation of blood pressure.  Stated she checks her BP at home; reports BP 116/68, and P. 68 at 11:00 AM today.  Advised to notify her PCP of her recent symptoms.  States she has appt. With her cardiologist on 08/25/12, and will discuss with her.  Pt. encouraged to call back to [facility] if further symptoms.  Verb. Understanding.

## 2012-08-21 NOTE — Patient Instructions (Addendum)
Follow up for any focal weakness, speech changes, swallowing difficulty, or blurred vision.

## 2012-08-21 NOTE — Telephone Encounter (Signed)
Patient Information:  Caller Name: Anquanette  Phone: [phone]  Patient: [name], [name]  Gender: Female  DOB: 01/11/1921  Age: 79 Years  PCP: [name] (Family Practice)  Office Follow Up:  Does the office need to follow up with this patient?: No  Instructions For The Office: N/A   Symptoms  Reason For Call & Symptoms: Wed night 7/9  episode of palpitations started about 9 pm and continued intermittently rest of night.  Sleep was sporatic but did manage to take nap later during Thurs 7/10.  Felt pounding in neck and temples.  BP 116/68, pulse 68.   Spoke with cardiologist and not able to see in otfice until 7/25, told to contact PMD for check.  Reviewed Health History In EMR: Yes  Reviewed Medications In EMR: Yes  Reviewed Allergies In EMR: Yes  Reviewed Surgeries / Procedures: Yes  Date of Onset of Symptoms: 08/19/2012  Guideline(s) Used:  Heart Rate and Heartbeat Questions  Disposition Per Guideline:   See Today in Office  Reason For Disposition Reached:   Age > 60 years  Advice Given:  N/A  Patient Will Follow Care Advice:  YES  Appointment Scheduled:  08/21/2012 16:30:00 Appointment Scheduled Provider:  [name] (Family Practice)

## 2012-08-25 ENCOUNTER — Ambulatory Visit: Payer: Medicare Other | Admitting: Internal Medicine

## 2012-08-28 ENCOUNTER — Ambulatory Visit: Payer: Medicare Other | Admitting: Family Medicine

## 2012-09-04 ENCOUNTER — Ambulatory Visit: Payer: Medicare Other | Admitting: Physician Assistant

## 2012-09-07 ENCOUNTER — Telehealth: Payer: Self-pay | Admitting: Family Medicine

## 2012-09-07 NOTE — Telephone Encounter (Signed)
PT called and stated that she would like to be referred for rehabilitation due to a pulled ham string. Please assist.

## 2012-09-08 ENCOUNTER — Encounter: Payer: Self-pay | Admitting: Family Medicine

## 2012-09-08 ENCOUNTER — Ambulatory Visit (INDEPENDENT_AMBULATORY_CARE_PROVIDER_SITE_OTHER): Payer: Medicare Other | Admitting: Family Medicine

## 2012-09-08 VITALS — BP 122/70 | Temp 97.7°F | Wt 131.0 lb

## 2012-09-08 DIAGNOSIS — IMO0002 Reserved for concepts with insufficient information to code with codable children: Secondary | ICD-10-CM

## 2012-09-08 DIAGNOSIS — S76311A Strain of muscle, fascia and tendon of the posterior muscle group at thigh level, right thigh, initial encounter: Secondary | ICD-10-CM

## 2012-09-08 NOTE — Patient Instructions (Addendum)
-  heat for 15 minutes twice daily to sore muscle  -tyelnol 500-1000mg  up to twice daily if needed for pain  -gentle activity  -We placed a referral for you as discussed. It usually takes about 1-2 weeks to process and schedule this referral. If you have not heard from [place] regarding this appointment in 2 weeks please contact our office.  -follow up with your doctor in 3-4 weeks

## 2012-09-08 NOTE — Telephone Encounter (Signed)
This appears to be a new problem.  She should be seen and then we can refer if indicated.

## 2012-09-08 NOTE — Telephone Encounter (Signed)
Left detailed message on VM.

## 2012-09-08 NOTE — Progress Notes (Signed)
Chief Complaint  Patient presents with  . right hamstring pull    HPI:  79 yo pt of Dr. Lucie Leather here for acute visit for "pulled hamstring": -started 2 weeks ago when was mulching/doing yard work - 16 bags of mulch -pain in L hamstring -hasn't tried anything other then some massage -feels better, but still hurts -can walk normal -denies: fevers, chills, weakness, numbness, back pain -she wants to get physical therpay ROS: See pertinent positives and negatives per HPI.  Past Medical History  Diagnosis Date  . COLONIC POLYPS, ADENOMATOUS 12/26/2009  . DIABETES MELLITUS, TYPE II 08/11/2006  . HYPERLIPIDEMIA 09/09/2006  . ANEMIA, NORMOCYTIC 11/30/2009  . HYPERTENSION 09/09/2006  . CAD, NATIVE VESSEL 04/27/2008    Cardiac catheterization 6/08: EF 55%, 1+ MR, Dx 50%, LAD 50%, dLAD 40%, OM1 50-60% (up to 70%), pOM 30%, pRCA 40%, mRCA 50-60%, pPDA 50-60% (up to 70%).  Medical therapy was recommended  . LEFT BUNDLE BRANCH BLOCK 08/11/2006  . Atrial flutter 04/12/2009  . TIA 09/02/2006    Echocardiogram 6/12: Mild to moderate MR, trace AI, trace TR, PASP 34, bubble study negative for intracardiac shunting, normal EF (greater than 55%)  . HEMORRHOIDS-INTERNAL 12/26/2009  . GERD 02/16/2007  . DIVERTICULOSIS, COLON 08/11/2006  . OSTEOARTHRITIS 08/11/2006  . OSTEOPOROSIS 08/11/2006  . Carotid stenosis     dopplers 6/12: LICA 50-79%    Family History  Problem Relation Age of Onset  . Heart disease Mother 66    MI  . Kidney disease Mother   . Heart disease Father 65    CHF  . Cancer Sister     breast, uterine  . Cancer Brother     colon  . Heart disease Brother     History   Social History  . Marital Status: Widowed    Spouse Name: N/A    Number of Children: N/A  . Years of Education: N/A   Social History Main Topics  . Smoking status: Never Smoker   . Smokeless tobacco: Never Used  . Alcohol Use: Yes     Comment: 1 glass of wine with dinner every now and then  . Drug Use: No   . Sexually Active: None   Other Topics Concern  . None   Social History Narrative  . None    Current outpatient prescriptions:Ascorbic Acid (VITAMIN C PO), Take by mouth., Disp: , Rfl: ;  aspirin 81 MG tablet, Take 81 mg by mouth daily.  , Disp: , Rfl: ;  calcium carbonate 200 MG capsule, Take 250 mg by mouth 2 (two) times daily with a meal.  , Disp: , Rfl: ;  Cholecalciferol (VITAMIN D PO), Take 1 tablet by mouth daily. , Disp: , Rfl: ;  clopidogrel (PLAVIX) 75 MG tablet, TAKE 1 TABLET DAILY, Disp: 90 tablet, Rfl: 2 Cyanocobalamin (VITAMIN B 12 PO), Take 1 tablet by mouth daily. , Disp: , Rfl: ;  docusate sodium (COLACE) 100 MG capsule, Take 100 mg by mouth daily. , Disp: , Rfl: ;  fish oil-omega-3 fatty acids 1000 MG capsule, Take 1 g by mouth daily. , Disp: , Rfl: ;  gabapentin (NEURONTIN) 100 MG capsule, Take 100 mg by mouth as needed., Disp: , Rfl: ;  glucose blood test strip, Use as instructed once a week, Disp: , Rfl:  hydrocortisone (ANUSOL-HC) 25 MG suppository, Place 25 mg rectally 2 (two) times daily as needed., Disp: , Rfl: ;  metoprolol tartrate (LOPRESSOR) 25 MG tablet, TAKE ONE-HALF (1/2) TABLET (12.5 MG  TOTAL) TWICE A DAY, Disp: 90 tablet, Rfl: 2;  Multiple Vitamin (MULTIVITAMIN) tablet, Take 1 tablet by mouth daily.  , Disp: , Rfl: ;  Multiple Vitamins-Minerals (OCUVITE ADULT 50+ PO), Take by mouth daily.  , Disp: , Rfl:  ONE TOUCH LANCETS MISC, Use daily as directed, Disp: 200 each, Rfl: 3;  pantoprazole (PROTONIX) 40 MG tablet, TAKE 0.5 TABLET (1/2) EVERY OTHER DAY., Disp: 45 tablet, Rfl: 1;  simvastatin (ZOCOR) 40 MG tablet, TAKE 1 TABLET AT BEDTIME, Disp: 90 tablet, Rfl: 2;  VITAMIN E PO, Take by mouth., Disp: , Rfl: ;  zolpidem (AMBIEN) 10 MG tablet, Take 1 tablet (10 mg total) by mouth at bedtime as needed. For sleep, Disp: 90 tablet, Rfl: 1  EXAM:  Filed Vitals:   09/08/12 1504  BP: 122/70  Temp: 97.7 F (36.5 C)    Body mass index is 20.51 kg/(m^2).  GENERAL:  vitals reviewed and listed above, alert, oriented, appears well hydrated and in no acute distress  HEENT: atraumatic, conjunttiva clear, no obvious abnormalities on inspection of external nose and ears  NECK: no obvious masses on inspection  LUNGS: clear to auscultation bilaterally, no wheezes, rales or rhonchi, good air movement  CV: HRRR, no peripheral edema  MS: moves all extremities without noticeable abnormality -normal gait, no swelling -normal strenght, ROM, sensation bilat LEs, no bony TTP, mild TTP R hamstring  PSYCH: pleasant and cooperative, no obvious depression or anxiety  ASSESSMENT AND PLAN:  Discussed the following assessment and plan:  Hamstring strain, right, initial encounter - Plan: Ambulatory referral to Physical Therapy  -exam /w hamstring strain -referred to PT per her request -follow up with PCP in 3-4 weeks -Patient advised to return or notify a doctor immediately if symptoms worsen or persist or new concerns arise.  Patient Instructions  -heat for 15 minutes twice daily to sore muscle  -tyelnol 500-1000mg  up to twice daily if needed for pain  -gentle activity  -follow up with your doctor in 3-4 weeks     Ahamed Hofland R.

## 2012-09-09 ENCOUNTER — Ambulatory Visit: Payer: Medicare Other | Attending: Family Medicine | Admitting: Physical Therapy

## 2012-09-09 DIAGNOSIS — IMO0001 Reserved for inherently not codable concepts without codable children: Secondary | ICD-10-CM | POA: Insufficient documentation

## 2012-09-09 DIAGNOSIS — R5381 Other malaise: Secondary | ICD-10-CM | POA: Insufficient documentation

## 2012-09-10 ENCOUNTER — Ambulatory Visit: Payer: Medicare Other | Admitting: Physical Therapy

## 2012-09-10 ENCOUNTER — Encounter: Payer: Self-pay | Admitting: Internal Medicine

## 2012-09-14 ENCOUNTER — Ambulatory Visit: Payer: Medicare Other | Attending: Family Medicine | Admitting: Physical Therapy

## 2012-09-14 DIAGNOSIS — R5381 Other malaise: Secondary | ICD-10-CM | POA: Insufficient documentation

## 2012-09-14 DIAGNOSIS — IMO0001 Reserved for inherently not codable concepts without codable children: Secondary | ICD-10-CM | POA: Insufficient documentation

## 2012-09-16 ENCOUNTER — Ambulatory Visit: Payer: Medicare Other | Admitting: Physical Therapy

## 2012-09-17 ENCOUNTER — Ambulatory Visit: Payer: Medicare Other | Admitting: Physical Therapy

## 2012-09-21 ENCOUNTER — Ambulatory Visit: Payer: Medicare Other | Admitting: Physical Therapy

## 2012-09-21 ENCOUNTER — Ambulatory Visit: Payer: Medicare Other | Admitting: Internal Medicine

## 2012-09-24 ENCOUNTER — Ambulatory Visit: Payer: Medicare Other | Admitting: Physical Therapy

## 2012-09-28 ENCOUNTER — Ambulatory Visit: Payer: Medicare Other | Admitting: Physical Therapy

## 2012-09-30 ENCOUNTER — Encounter: Payer: Medicare Other | Admitting: Physical Therapy

## 2012-10-08 ENCOUNTER — Encounter: Payer: Self-pay | Admitting: Physician Assistant

## 2012-10-08 ENCOUNTER — Ambulatory Visit (INDEPENDENT_AMBULATORY_CARE_PROVIDER_SITE_OTHER): Payer: Medicare Other | Admitting: Physician Assistant

## 2012-10-08 VITALS — BP 138/62 | HR 68 | Ht 67.0 in | Wt 131.0 lb

## 2012-10-08 DIAGNOSIS — I4892 Unspecified atrial flutter: Secondary | ICD-10-CM

## 2012-10-08 DIAGNOSIS — I2510 Atherosclerotic heart disease of native coronary artery without angina pectoris: Secondary | ICD-10-CM

## 2012-10-08 DIAGNOSIS — I6523 Occlusion and stenosis of bilateral carotid arteries: Secondary | ICD-10-CM

## 2012-10-08 DIAGNOSIS — I6529 Occlusion and stenosis of unspecified carotid artery: Secondary | ICD-10-CM

## 2012-10-08 DIAGNOSIS — I10 Essential (primary) hypertension: Secondary | ICD-10-CM

## 2012-10-08 DIAGNOSIS — R002 Palpitations: Secondary | ICD-10-CM

## 2012-10-08 DIAGNOSIS — I658 Occlusion and stenosis of other precerebral arteries: Secondary | ICD-10-CM

## 2012-10-08 DIAGNOSIS — E785 Hyperlipidemia, unspecified: Secondary | ICD-10-CM

## 2012-10-08 NOTE — Progress Notes (Signed)
[address] [place], [place]  [address] Phone: [phone] Fax:  [phone]  Date:  10/08/2012   ID:  [name], DOB 1920-07-26, MRN [id number]  PCP:  Kristian Covey, MD  Cardiologist:  Dr.  Shawnie Pons => Dr. Dietrich Pates     History of Present Illness: [name] is a 79 y.o. female who returns for followup.  She has a history of CAD, prior TIA, carotid stenosis, status post left CEA 11/2010, paroxysmal atrial flutter, HTN, DM2, HL, LBBB. She has been felt to be a poor candidate for Coumadin. LHC 6/08: EF 55%, 1+ MR, Dx 50%, LAD 50%, dLAD 40%, OM1 50-60% (up to 70%), pOM 30%, pRCA 40%, mRCA 50-60%, pPDA 50-60% (up to 70%). Medical therapy was recommended. Echo 05/2012:  EF 60-65%, normal wall motion, Gr 1 DD, MAC, mild MR, PASP 32, reduced excursion of AV noncoronary cusp.  Carotid US 5/14: patent L CEA, RICA < 40%.  Event Monitor 3/14:  NSR.   Last seen by Dr. Riley Kill 05/2012. He recommended follow up with Dr. Tenny Craw in the future.  Since last seen, she denies chest pain, significant dyspnea, orthopnea, PND or edema. She denies syncope. She did have an episode of palpitations several weeks ago. She has had no recurrence. She denies any rapid palpitations.  Labs (2/14):  K 5.1, creatinine 1.1, ALT 22, Hgb 11.6, TSH 3.60  Wt Readings from Last 3 Encounters:  10/08/12 131 lb (59.421 kg)  09/08/12 131 lb (59.421 kg)  08/21/12 133 lb (60.328 kg)     Past Medical History  Diagnosis Date  . COLONIC POLYPS, ADENOMATOUS 12/26/2009  . DIABETES MELLITUS, TYPE II 08/11/2006  . HYPERLIPIDEMIA 09/09/2006  . ANEMIA, NORMOCYTIC 11/30/2009  . HYPERTENSION 09/09/2006  . CAD, NATIVE VESSEL 04/27/2008    Cardiac catheterization 6/08: EF 55%, 1+ MR, Dx 50%, LAD 50%, dLAD 40%, OM1 50-60% (up to 70%), pOM 30%, pRCA 40%, mRCA 50-60%, pPDA 50-60% (up to 70%).  Medical therapy was recommended  . LEFT BUNDLE BRANCH BLOCK 08/11/2006  . Atrial flutter 04/12/2009  . TIA 09/02/2006      Echocardiogram 6/12: Mild to moderate MR, trace AI, trace TR, PASP 34, bubble study negative for intracardiac shunting, normal EF (greater than 55%)  . HEMORRHOIDS-INTERNAL 12/26/2009  . GERD 02/16/2007  . DIVERTICULOSIS, COLON 08/11/2006  . OSTEOARTHRITIS 08/11/2006  . OSTEOPOROSIS 08/11/2006  . Carotid stenosis     dopplers 6/12: LICA 50-79%    Current Outpatient Prescriptions  Medication Sig Dispense Refill  . Ascorbic Acid (VITAMIN C PO) Take by mouth.      Marland Kitchen aspirin 81 MG tablet Take 81 mg by mouth daily.        . calcium carbonate 200 MG capsule Take 250 mg by mouth 2 (two) times daily with a meal.        . Cholecalciferol (VITAMIN D PO) Take 1 tablet by mouth daily.       . clopidogrel (PLAVIX) 75 MG tablet TAKE 1 TABLET DAILY  90 tablet  2  . Cyanocobalamin (VITAMIN B 12 PO) Take 1 tablet by mouth daily.       . fish oil-omega-3 fatty acids 1000 MG capsule Take 1 g by mouth daily.       Marland Kitchen glucose blood test strip Use as instructed once a week      . hydrocortisone (ANUSOL-HC) 25 MG suppository Place 25 mg rectally 2 (two) times daily as needed.      Marland Kitchen  metoprolol tartrate (LOPRESSOR) 25 MG tablet TAKE ONE-HALF (1/2) TABLET (12.5 MG TOTAL) TWICE A DAY  90 tablet  2  . Multiple Vitamin (MULTIVITAMIN) tablet Take 1 tablet by mouth daily.        . Multiple Vitamins-Minerals (OCUVITE ADULT 50+ PO) Take by mouth daily.        . ONE TOUCH LANCETS MISC Use daily as directed  200 each  3  . pantoprazole (PROTONIX) 40 MG tablet TAKE 0.5 TABLET (1/2) EVERY OTHER DAY.  45 tablet  1  . simvastatin (ZOCOR) 40 MG tablet TAKE 1 TABLET AT BEDTIME  90 tablet  2  . VITAMIN E PO Take by mouth.      . zolpidem (AMBIEN) 10 MG tablet Take 1 tablet (10 mg total) by mouth at bedtime as needed. For sleep  90 tablet  1   No current facility-administered medications for this visit.    Allergies:    Allergies  Allergen Reactions  . Doxycycline Hyclate     REACTION: upset stomach  . Nitrofurantoin      REACTION: unspecified  . Sulfamethoxazole W-Trimethoprim     REACTION: unspecified  . Sulfonamide Derivatives     REACTION: rash    Social History:  The patient  reports that she has never smoked. She has never used smokeless tobacco. She reports that  drinks alcohol. She reports that she does not use illicit drugs.   ROS:  Please see the history of present illness.   She noted some mild epistaxis recently after pulling some nose hairs. Otherwise she denies melena, hematochezia, hematuria.  All other systems reviewed and negative.   PHYSICAL EXAM: VS:  BP 138/62  Pulse 68  Ht 5\' 7"  (1.702 m)  Wt 131 lb (59.421 kg)  BMI 20.51 kg/m2 Well nourished, well developed, in no acute distress HEENT: normal Neck: no JVD Cardiac:  normal S1, S2; RRR; 1/6 systolic murmur at the RUSB Lungs:  clear to auscultation bilaterally, no wheezing, rhonchi or rales Abd: soft, nontender, no hepatomegaly Ext: no edema Skin: warm and dry Neuro:  CNs 2-12 intact, no focal abnormalities noted  EKG:  NSR, HR 68, LBBB     ASSESSMENT AND PLAN:  1. CAD:  No angina.  Continue ASA, Plavix and statin.  2. Palpitations:  Short lived.  Otherwise asymptomatic.  She can take an extra metoprolol 12.5 mg if she has a recurrence.   3. Atrial Flutter:  Maintaining NSR.  She is not felt to be a good candidate for coumadin. 4. Hypertension:  Controlled.  Continue current therapy.  5. Hyperlipidemia:  Continue statin. 6. Carotid Stenosis:  F/u with [facility]. 7. Disposition:  F/u with Dr. Dietrich Pates in 6 mos.   Signed, Tereso Newcomer, PA-C  10/08/2012 2:36 PM

## 2012-10-08 NOTE — Patient Instructions (Addendum)
Your physician recommends that you continue on your current medications as directed. Please refer to the Current Medication list given to you today.  Your physician recommends that you schedule a follow-up appointment in: 6 months with Dr.Ross

## 2012-10-13 ENCOUNTER — Ambulatory Visit (INDEPENDENT_AMBULATORY_CARE_PROVIDER_SITE_OTHER): Payer: Medicare Other | Admitting: General Surgery

## 2012-10-13 ENCOUNTER — Encounter (INDEPENDENT_AMBULATORY_CARE_PROVIDER_SITE_OTHER): Payer: Self-pay | Admitting: General Surgery

## 2012-10-13 VITALS — BP 160/60 | HR 64 | Ht 67.0 in | Wt 131.2 lb

## 2012-10-13 DIAGNOSIS — K644 Residual hemorrhoidal skin tags: Secondary | ICD-10-CM

## 2012-10-13 DIAGNOSIS — K648 Other hemorrhoids: Secondary | ICD-10-CM

## 2012-10-13 MED ORDER — HYDROCORTISONE 2.5 % RE CREA: TOPICAL_CREAM | Freq: Two times a day (BID) | RECTAL | Status: DC

## 2012-10-13 NOTE — Patient Instructions (Addendum)
Work on constipation.  Take colace 100 mg twice daily.  Take miralax 17 gm every other day.  If you still have constipation, increase to every day.

## 2012-10-15 ENCOUNTER — Other Ambulatory Visit (INDEPENDENT_AMBULATORY_CARE_PROVIDER_SITE_OTHER): Payer: Self-pay | Admitting: *Deleted

## 2012-10-15 ENCOUNTER — Telehealth (INDEPENDENT_AMBULATORY_CARE_PROVIDER_SITE_OTHER): Payer: Self-pay | Admitting: *Deleted

## 2012-10-15 MED ORDER — HYDROCORTISONE ACETATE 25 MG RE SUPP: 25.0000 mg | Freq: Two times a day (BID) | RECTAL | Status: DC | PRN

## 2012-10-15 NOTE — Telephone Encounter (Signed)
Patient called in this morning with confusion about what she had been prescribed.  Patient states in the past she has used the [facility] suppositories however when she was in on 10/13/12 she was prescribed the rectal cream.  Patient states she would prefer the suppositories.  Spoke to Dr. Donell Beers who gave telephone orders to cancel the rectal cream and change to the suppositories.  Gave order for Anusol HC 25mg  suppositories BID PRN #30 with 1 refill.  This prescription was escribed to [facility] at this time.  Patient has been updated and aware of the plan at this time.

## 2012-10-20 NOTE — Assessment & Plan Note (Addendum)
Patient is advised to be aggressive about treating her constipation. She is also advised to be diligent with her stool softeners and to take the MiraLAX daily until her stools are regular. At that point she can back off to MiraLAX every other day.  She is advised that she can go back on the steroid suppositories or cream for decreasing inflammation of the hemorrhoids. I'll see her back as needed.

## 2012-10-20 NOTE — Progress Notes (Signed)
Chief Complaint  Patient presents with  . Follow-up    HISTORY: Patient is an 79 year old female whom I saw previously in March. At that time I injected circumferential internal hemorrhoids.  She has a pattern where she does really well right after injection. She then has an issue where she is constipated and her diet changes. She then develops bleeding and prolapse. This has occurred again. She reports that is slightly better than when the bleeding started but that she continues to have discomfort.  EXAM: Filed Vitals:   10/13/12 1427  BP: 160/60  Pulse: 64    Gen:  No acute distress.  Well nourished and well groomed.   Neurological: Alert and oriented to person, place, and time. Coordination normal.  Head: Normocephalic and atraumatic.  Eyes: Conjunctivae are normal. Pupils are equal, round, and reactive to light. No scleral icterus. Marland Kitchen   Respiratory: Effort normal.  No respiratory distress.  Rectal:  Small circumferential external hemorrhoids.  No thrombosis seen.  Anoscopy demonstrated large  internal hemorrhoids primarily posteriorly and right lateral.  These are injected.   Musculoskeletal: Normal range of motion. Skin: Skin is warm and dry. No rash noted. No diaphoresis. No erythema. No pallor. No clubbing, cyanosis, or edema.   Psychiatric: Normal mood and affect. Behavior is normal. Judgment and thought content normal.    ASSESSMENT AND PLAN: Internal and external bleeding hemorrhoids Patient is advised to be aggressive about treating her constipation. She is also advised to be diligent with her stool softeners and to take the MiraLAX daily until her stools are regular. At that point she can back off to MiraLAX every other day.  She is advised that she can go back on the steroid suppositories or cream for decreasing inflammation of the hemorrhoids. I'll see her back as needed.  Anusol suppositories with hemorrhoid flare.  Maudry Diego MD Surgical Oncology, General and  Endocrine Surgery [facility]      Visit Diagnoses: 1. Internal and external bleeding hemorrhoids     Primary Care Physician: Kristian Covey, MD

## 2012-10-21 ENCOUNTER — Ambulatory Visit (INDEPENDENT_AMBULATORY_CARE_PROVIDER_SITE_OTHER): Payer: Medicare Other

## 2012-10-21 ENCOUNTER — Telehealth (INDEPENDENT_AMBULATORY_CARE_PROVIDER_SITE_OTHER): Payer: Self-pay

## 2012-10-21 DIAGNOSIS — Z23 Encounter for immunization: Secondary | ICD-10-CM

## 2012-10-23 ENCOUNTER — Ambulatory Visit: Payer: Medicare Other | Admitting: Family Medicine

## 2012-10-29 ENCOUNTER — Ambulatory Visit (INDEPENDENT_AMBULATORY_CARE_PROVIDER_SITE_OTHER): Payer: Medicare Other | Admitting: Family Medicine

## 2012-10-29 ENCOUNTER — Encounter: Payer: Self-pay | Admitting: Family Medicine

## 2012-10-29 VITALS — BP 130/64 | HR 60 | Temp 97.8°F | Wt 133.0 lb

## 2012-10-29 DIAGNOSIS — D649 Anemia, unspecified: Secondary | ICD-10-CM

## 2012-10-29 DIAGNOSIS — I10 Essential (primary) hypertension: Secondary | ICD-10-CM

## 2012-10-29 DIAGNOSIS — E785 Hyperlipidemia, unspecified: Secondary | ICD-10-CM

## 2012-10-29 DIAGNOSIS — R5381 Other malaise: Secondary | ICD-10-CM

## 2012-10-29 DIAGNOSIS — E119 Type 2 diabetes mellitus without complications: Secondary | ICD-10-CM

## 2012-10-29 LAB — CBC WITH DIFFERENTIAL/PLATELET
Basophils Relative: 0.3 % (ref 0.0–3.0)
Eosinophils Absolute: 0.1 10*3/uL (ref 0.0–0.7)
HCT: 32.8 % — ABNORMAL LOW (ref 36.0–46.0)
Hemoglobin: 11.1 g/dL — ABNORMAL LOW (ref 12.0–15.0)
Lymphocytes Relative: 29.3 % (ref 12.0–46.0)
Lymphs Abs: 2.4 10*3/uL (ref 0.7–4.0)
MCHC: 33.7 g/dL (ref 30.0–36.0)
Monocytes Relative: 11 % (ref 3.0–12.0)
Neutro Abs: 4.7 10*3/uL (ref 1.4–7.7)
RBC: 3.38 Mil/uL — ABNORMAL LOW (ref 3.87–5.11)
RDW: 14.1 % (ref 11.5–14.6)

## 2012-10-29 MED ORDER — ZOLPIDEM TARTRATE 10 MG PO TABS: 10.0000 mg | ORAL_TABLET | Freq: Every evening | ORAL | Status: DC | PRN

## 2012-10-29 NOTE — Progress Notes (Signed)
Subjective:    Patient ID: [name], female    DOB: Sep 12, 1920, 79 y.o.   MRN: [id number]  HPI Patient seen today for routine medical followup. She continues to complain mostly of chronic fatigue. She is sleeping about 7 hours per night with the assistance of Ambien. She is requesting refills. Denies any major depression. Her malaise is very nonspecific. Generally does not feel rested when she wakes up each day. Denies any chest pains. No dyspnea. No cough. No dysuria. No abdominal pain.  Type 2 diabetes with history of good control. Blood sugars been stable recently. No polydipsia or polyuria. She has already had flu vaccine.  Recent hamstring injury which improved with physical therapy. She is ambulating without difficulty now.  Past Medical History  Diagnosis Date  . COLONIC POLYPS, ADENOMATOUS 12/26/2009  . DIABETES MELLITUS, TYPE II 08/11/2006  . HYPERLIPIDEMIA 09/09/2006  . ANEMIA, NORMOCYTIC 11/30/2009  . HYPERTENSION 09/09/2006  . CAD, NATIVE VESSEL 04/27/2008    Cardiac catheterization 6/08: EF 55%, 1+ MR, Dx 50%, LAD 50%, dLAD 40%, OM1 50-60% (up to 70%), pOM 30%, pRCA 40%, mRCA 50-60%, pPDA 50-60% (up to 70%).  Medical therapy was recommended  . LEFT BUNDLE BRANCH BLOCK 08/11/2006  . Atrial flutter 04/12/2009  . TIA 09/02/2006    Echocardiogram 6/12: Mild to moderate MR, trace AI, trace TR, PASP 34, bubble study negative for intracardiac shunting, normal EF (greater than 55%)  . HEMORRHOIDS-INTERNAL 12/26/2009  . GERD 02/16/2007  . DIVERTICULOSIS, COLON 08/11/2006  . OSTEOARTHRITIS 08/11/2006  . OSTEOPOROSIS 08/11/2006  . Carotid stenosis     dopplers 6/12: LICA 50-79%   Past Surgical History  Procedure Laterality Date  . Tonsillectomy    . Eye surgery      catarac  . Closed reduction metatarsal fracture      right 5th  . Colonoscopy w/ polypectomy  2001    reports that she has never smoked. She has never used smokeless tobacco. She reports that  drinks alcohol. She  reports that she does not use illicit drugs. family history includes Cancer in her brother and sister; Heart disease in her brother; Heart disease (age of onset: 51) in her mother; Heart disease (age of onset: 41) in her father; Kidney disease in her mother. Allergies  Allergen Reactions  . Doxycycline Hyclate     REACTION: upset stomach  . Nitrofurantoin     REACTION: unspecified  . Sulfamethoxazole W-Trimethoprim     REACTION: unspecified  . Sulfonamide Derivatives     REACTION: rash      Review of Systems  Constitutional: Positive for fatigue. Negative for fever, chills, appetite change and unexpected weight change.  HENT: Negative for trouble swallowing.   Respiratory: Negative for cough and shortness of breath.   Cardiovascular: Negative for chest pain.  Gastrointestinal: Negative for nausea, vomiting, abdominal pain and diarrhea.  Endocrine: Negative for polydipsia and polyuria.  Genitourinary: Negative for dysuria.  Hematological: Negative for adenopathy.  Psychiatric/Behavioral: Positive for sleep disturbance. Negative for dysphoric mood.       Objective:   Physical Exam  Constitutional: She is oriented to person, place, and time. She appears well-developed and well-nourished.  HENT:  Right Ear: External ear normal.  Left Ear: External ear normal.  Mouth/Throat: Oropharynx is clear and moist.  Neck: Neck supple. No thyromegaly present.  Cardiovascular: Normal rate and regular rhythm.   Pulmonary/Chest: Effort normal and breath sounds normal. No respiratory distress. She has no wheezes. She has no rales.  Musculoskeletal:  She exhibits no edema.  Neurological: She is alert and oriented to person, place, and time.  Psychiatric: She has a normal mood and affect. Her behavior is normal.          Assessment & Plan:  #1 type 2 diabetes. History of good control. Recheck A1c #2 history of normocytic anemia. Previous hemoglobin 11 range. Repeat CBC. Add B12 level. She  does take PPI but only about 2 days per week #3 chronic insomnia. Sleep hygiene discussed. Refill Ambien for 6 months. She is aware of potential side effects including increased risk of falls #4 hypertension which is well-controlled #5 fatigue.  Likely multifactorial. Check labs as above.

## 2012-10-30 ENCOUNTER — Encounter: Payer: Self-pay | Admitting: Family Medicine

## 2012-11-05 ENCOUNTER — Telehealth (INDEPENDENT_AMBULATORY_CARE_PROVIDER_SITE_OTHER): Payer: Self-pay

## 2012-11-05 NOTE — Telephone Encounter (Signed)
Pt calling in to report that she had blood on her panti liner yesterday after a bridge game. The pt is on Plavix and she is just concerned that she had some bleeding yesterday. The pt is better today with no bleeding. The pt has not had any blood in the toilet but a little on the toilet paper. The pt has a f/u appt with Dr Donell Beers in the middle of October so she wanted to report this info to her now.

## 2012-11-25 ENCOUNTER — Encounter (INDEPENDENT_AMBULATORY_CARE_PROVIDER_SITE_OTHER): Payer: Self-pay | Admitting: General Surgery

## 2012-11-25 ENCOUNTER — Ambulatory Visit (INDEPENDENT_AMBULATORY_CARE_PROVIDER_SITE_OTHER): Payer: Medicare Other | Admitting: General Surgery

## 2012-11-25 VITALS — BP 162/68 | HR 74 | Resp 16 | Ht 66.5 in | Wt 133.8 lb

## 2012-11-25 DIAGNOSIS — K648 Other hemorrhoids: Secondary | ICD-10-CM

## 2012-11-25 DIAGNOSIS — K644 Residual hemorrhoidal skin tags: Secondary | ICD-10-CM

## 2012-11-25 MED ORDER — HYDROCORTISONE ACETATE 25 MG RE SUPP: 25.0000 mg | Freq: Two times a day (BID) | RECTAL | Status: DC | PRN

## 2012-11-25 NOTE — Progress Notes (Signed)
No chief complaint on file.   HISTORY: Patient is an 79 year old female whom I saw previously in September.   I have injected circumferential internal hemorrhoids numerous times.  She has a pattern where she does really well right after injection. She then has an issue where she is constipated and her diet changes. She then develops bleeding and prolapse. This has occurred again. She reports that is slightly better than when the bleeding started but that she continues to have discomfort.  She has started having problems again with prolapse and bleeding.    EXAM: Filed Vitals:   11/25/12 1429  BP: 162/68  Pulse: 74  Resp: 16    Gen:  No acute distress.  Well nourished and well groomed.   Neurological: Alert and oriented to person, place, and time. Coordination normal.  Head: Normocephalic and atraumatic.  Eyes: Conjunctivae are normal. Pupils are equal, round, and reactive to light. No scleral icterus. Marland Kitchen   Respiratory: Effort normal.  No respiratory distress.  Rectal:  Small circumferential external hemorrhoids.  No thrombosis seen.  Anoscopy demonstrated large  internal hemorrhoids primarily posteriorly and left lateral.  These are both banded.   Musculoskeletal: Normal range of motion. Skin: Skin is warm and dry. No rash noted. No diaphoresis. No erythema. No pallor. No clubbing, cyanosis, or edema.   Psychiatric: Normal mood and affect. Behavior is normal. Judgment and thought content normal.    ASSESSMENT AND PLAN: Internal and external bleeding hemorrhoids I banded 2 hemorrhoids.   I will try again to get the suppositories.  She had good luck with those in the past, but was not able to get them with her insurance.    I will see her back in 6-8 weeks.    Anusol suppositories with hemorrhoid flare.  Maudry Diego MD Surgical Oncology, General and Endocrine Surgery [facility]      Visit Diagnoses: 1. Internal and external bleeding hemorrhoids      Primary Care Physician: Kristian Covey, MD

## 2012-11-25 NOTE — Assessment & Plan Note (Signed)
I banded 2 hemorrhoids.   I will try again to get the suppositories.  She had good luck with those in the past, but was not able to get them with her insurance.    I will see her back in 6-8 weeks.

## 2012-11-25 NOTE — Patient Instructions (Signed)
Try 100 mg colace per day (either 1 x100 or 2x50mg ) and add miralax 17 gm every other day;.  We will try again on the suppositories.

## 2012-11-26 NOTE — Telephone Encounter (Signed)
error 

## 2012-12-01 ENCOUNTER — Telehealth: Payer: Self-pay | Admitting: Internal Medicine

## 2012-12-01 NOTE — Telephone Encounter (Signed)
Discussed with scott weaver pa, no changes at this time. She needs to increase her fluids. She will see scott on Friday this week

## 2012-12-01 NOTE — Telephone Encounter (Addendum)
Spoke with pt, she felt weak this morning, her bp 92/49. Later today it was 115/81. She cont to feel tired and she is getting SOB with exertion. She does not have any swelling and no trouble sleeping. Will discuss with scott weaver pac.

## 2012-12-01 NOTE — Telephone Encounter (Signed)
New message    C/o blood pressure low, back pain.   She want to be seen, however she want to talk to a nurse first

## 2012-12-04 ENCOUNTER — Ambulatory Visit
Admission: RE | Admit: 2012-12-04 | Discharge: 2012-12-04 | Disposition: A | Discharge status: Home / Self Care | Payer: Medicare Other | Source: Ambulatory Visit | Attending: Physician Assistant | Admitting: Physician Assistant

## 2012-12-04 ENCOUNTER — Encounter: Payer: Self-pay | Admitting: Physician Assistant

## 2012-12-04 ENCOUNTER — Ambulatory Visit (INDEPENDENT_AMBULATORY_CARE_PROVIDER_SITE_OTHER): Payer: Medicare Other | Admitting: Physician Assistant

## 2012-12-04 VITALS — BP 169/65 | HR 70 | Ht 66.0 in | Wt 128.0 lb

## 2012-12-04 DIAGNOSIS — I2510 Atherosclerotic heart disease of native coronary artery without angina pectoris: Secondary | ICD-10-CM

## 2012-12-04 DIAGNOSIS — M549 Dorsalgia, unspecified: Secondary | ICD-10-CM

## 2012-12-04 DIAGNOSIS — R0600 Dyspnea, unspecified: Secondary | ICD-10-CM

## 2012-12-04 DIAGNOSIS — I6529 Occlusion and stenosis of unspecified carotid artery: Secondary | ICD-10-CM

## 2012-12-04 DIAGNOSIS — R5383 Other fatigue: Secondary | ICD-10-CM

## 2012-12-04 DIAGNOSIS — I4892 Unspecified atrial flutter: Secondary | ICD-10-CM

## 2012-12-04 DIAGNOSIS — R0609 Other forms of dyspnea: Secondary | ICD-10-CM

## 2012-12-04 DIAGNOSIS — R5381 Other malaise: Secondary | ICD-10-CM

## 2012-12-04 DIAGNOSIS — I10 Essential (primary) hypertension: Secondary | ICD-10-CM

## 2012-12-04 LAB — CBC WITH DIFFERENTIAL/PLATELET
Basophils Absolute: 0 10*3/uL (ref 0.0–0.1)
Eosinophils Relative: 1.4 % (ref 0.0–5.0)
HCT: 34.7 % — ABNORMAL LOW (ref 36.0–46.0)
Lymphs Abs: 2.2 10*3/uL (ref 0.7–4.0)
MCHC: 33.7 g/dL (ref 30.0–36.0)
MCV: 96.1 fl (ref 78.0–100.0)
Monocytes Absolute: 0.7 10*3/uL (ref 0.1–1.0)
Platelets: 212 10*3/uL (ref 150.0–400.0)
RDW: 13.7 % (ref 11.5–14.6)

## 2012-12-04 LAB — HEPATIC FUNCTION PANEL
ALT: 20 U/L (ref 0–35)
Alkaline Phosphatase: 33 U/L — ABNORMAL LOW (ref 39–117)
Bilirubin, Direct: 0 mg/dL (ref 0.0–0.3)
Total Bilirubin: 0.5 mg/dL (ref 0.3–1.2)
Total Protein: 7.8 g/dL (ref 6.0–8.3)

## 2012-12-04 LAB — BASIC METABOLIC PANEL
BUN: 25 mg/dL — ABNORMAL HIGH (ref 6–23)
CO2: 28 mEq/L (ref 19–32)
Chloride: 100 mEq/L (ref 96–112)
Glucose, Bld: 124 mg/dL — ABNORMAL HIGH (ref 70–99)
Potassium: 4.4 mEq/L (ref 3.5–5.1)

## 2012-12-04 MED ORDER — NITROGLYCERIN 0.4 MG SL SUBL: 0.4000 mg | SUBLINGUAL_TABLET | SUBLINGUAL | Status: DC | PRN

## 2012-12-04 NOTE — Progress Notes (Signed)
[address] [place], [place]  [address] Phone: [phone] Fax:  [phone]  Date:  12/04/2012   ID:  [name], DOB 22-Jan-1921, MRN [id number]  PCP:  Kristian Covey, MD  Cardiologist:  Dr.  Shawnie Pons => Dr. Dietrich Pates     History of Present Illness: [name] is a 79 y.o. female who returns for evaluation of dyspnea.  She has a hx of CAD, prior TIA, carotid stenosis, s/p left CEA 11/2010, paroxysmal AFlutter, HTN, DM2, HL, LBBB. She has been felt to be a poor candidate for Coumadin. LHC 6/08: EF 55%, 1+ MR, Dx 50%, LAD 50%, dLAD 40%, OM1 50-60% (up to 70%), pOM 30%, pRCA 40%, mRCA 50-60%, pPDA 50-60% (up to 70%). Medical Rx was recommended. Echo 05/2012:  EF 60-65%, normal wall motion, Gr 1 DD, MAC, mild MR, PASP 32, reduced excursion of AV noncoronary cusp.  Carotid US 5/14: patent L CEA, RICA < 40%.  Event Monitor 3/14:  NSR.    Last seen by me 09/30/12. She called in recently with low blood pressures and dyspnea with exertion.  She has felt more short of breath with activities over the last couple of months. She gets symptoms with minimal household activities such as cooking or cleaning. She denies chest pain. Sheis fatigued. Blood pressures were noted to be low recently. She brings in a list. This dates back to January 2014. The majority of her blood pressures are optimal. She had one reading of 92/49. She notes right lower back pain. This is with certain positional changes. She denies dysuria or fever. She denies orthopnea, PND or edema. She denies syncope.  Labs (2/14):  K 5.1, creatinine 1.1, ALT 22, Hgb 11.6, TSH 3.60 Labs (9/14):  Hgb 11.1, A1c 6.7  Wt Readings from Last 3 Encounters:  12/04/12 128 lb (58.06 kg)  11/25/12 133 lb 12.8 oz (60.691 kg)  10/29/12 133 lb (60.328 kg)     Past Medical History  Diagnosis Date  . COLONIC POLYPS, ADENOMATOUS 12/26/2009  . DIABETES MELLITUS, TYPE II 08/11/2006  . HYPERLIPIDEMIA 09/09/2006  . ANEMIA,  NORMOCYTIC 11/30/2009  . HYPERTENSION 09/09/2006  . CAD, NATIVE VESSEL 04/27/2008    Cardiac catheterization 6/08: EF 55%, 1+ MR, Dx 50%, LAD 50%, dLAD 40%, OM1 50-60% (up to 70%), pOM 30%, pRCA 40%, mRCA 50-60%, pPDA 50-60% (up to 70%).  Medical therapy was recommended  . LEFT BUNDLE BRANCH BLOCK 08/11/2006  . Atrial flutter 04/12/2009  . TIA 09/02/2006    Echocardiogram 6/12: Mild to moderate MR, trace AI, trace TR, PASP 34, bubble study negative for intracardiac shunting, normal EF (greater than 55%)  . HEMORRHOIDS-INTERNAL 12/26/2009  . GERD 02/16/2007  . DIVERTICULOSIS, COLON 08/11/2006  . OSTEOARTHRITIS 08/11/2006  . OSTEOPOROSIS 08/11/2006  . Carotid stenosis     dopplers 6/12: LICA 50-79%    Current Outpatient Prescriptions  Medication Sig Dispense Refill  . Ascorbic Acid (VITAMIN C PO) Take by mouth.      Marland Kitchen aspirin 81 MG tablet Take 81 mg by mouth daily.        . calcium carbonate 200 MG capsule Take 250 mg by mouth 2 (two) times daily with a meal.        . Cholecalciferol (VITAMIN D PO) Take 1 tablet by mouth daily.       . clopidogrel (PLAVIX) 75 MG tablet TAKE 1 TABLET DAILY  90 tablet  2  . Cyanocobalamin (VITAMIN B 12 PO) Take 1 tablet  by mouth daily.       . fish oil-omega-3 fatty acids 1000 MG capsule Take 1 g by mouth daily.       Marland Kitchen glucose blood test strip Use as instructed once a week      . metoprolol tartrate (LOPRESSOR) 25 MG tablet TAKE ONE-HALF (1/2) TABLET (12.5 MG TOTAL) TWICE A DAY  90 tablet  2  . Multiple Vitamin (MULTIVITAMIN) tablet Take 1 tablet by mouth daily.        . Multiple Vitamins-Minerals (OCUVITE ADULT 50+ PO) Take by mouth daily.        . ONE TOUCH LANCETS MISC Use daily as directed  200 each  3  . pantoprazole (PROTONIX) 40 MG tablet TAKE 0.5 TABLET (1/2) EVERY OTHER DAY.  45 tablet  1  . simvastatin (ZOCOR) 40 MG tablet TAKE 1 TABLET AT BEDTIME  90 tablet  2  . VITAMIN E PO Take 1 tablet by mouth daily.       Marland Kitchen zolpidem (AMBIEN) 10 MG tablet Take 1  tablet (10 mg total) by mouth at bedtime as needed. For sleep  90 tablet  1   No current facility-administered medications for this visit.    Allergies:    Allergies  Allergen Reactions  . Doxycycline Hyclate     REACTION: upset stomach  . Nitrofurantoin     REACTION: unspecified  . Sulfamethoxazole-Trimethoprim     REACTION: unspecified  . Sulfonamide Derivatives     REACTION: rash    Social History:  The patient  reports that she has never smoked. She has never used smokeless tobacco. She reports that she drinks alcohol. She reports that she does not use illicit drugs.   Family History:  The patient's family history includes Cancer in her brother and sister; Heart disease in her brother; Heart disease (age of onset: 51) in her mother; Heart disease (age of onset: 13) in her father; Kidney disease in her mother.   ROS:  Please see the history of present illness.  She had recent hemorrhoidal banding. She denies significant bleeding. She denies fevers, cough, melena, hematuria.  All other systems reviewed and negative.   PHYSICAL EXAM: VS:  BP 169/65  Pulse 70  Ht 5\' 6"  (1.676 m)  Wt 128 lb (58.06 kg)  BMI 20.67 kg/m2 Well nourished, well developed, in no acute distress HEENT: normal Neck: no JVD Cardiac:  normal S1, S2; RRR; 1/6 systolic murmur at the RUSB Lungs:  Good air movement; Question faint crackles at the bases; no wheezing Abd: soft, nontender, no hepatomegaly Ext: no edema Skin: warm and dry Neuro:  CNs 2-12 intact, no focal abnormalities noted  EKG:  NSR, HR 70, LBBB     ASSESSMENT AND PLAN:  1. Dyspnea: Etiology not entirely clear. She does describe fatigue. Doing simple household chores has become difficult. Question if this represents an anginal equivalent (CCS class III). She has some back pain as well as questionable abnormal findings on lung exam. I will set her up for a Lexiscan Myoview to rule out ischemia. I will arrange a chest x-ray. Check labs today:  Basic metabolic panel, CBC with diff, TSH, BNP, LFTs. She will be brought back for close follow up. 2. CAD:  Arrange Myoview as noted above.  Continue ASA, Plavix and statin.  I will give her a prescription for nitroglycerin when necessary. 3. Atrial Flutter:  Maintaining NSR.  She is not felt to be a good candidate for coumadin. 4. Hypertension:  Controlled.  Continue current therapy. I have advised her to hold her metoprolol if her blood pressure runs less than 100 systolic and she is symptomatic. 5. Hyperlipidemia:  Continue statin. 6. Carotid Stenosis:  F/u with [facility]. 7. Disposition:  F/u with me in 2 weeks.   Signed, Tereso Newcomer, PA-C  12/04/2012 10:04 AM

## 2012-12-04 NOTE — Patient Instructions (Signed)
Your physician recommends that you keep your  follow-up appointment in: Monday, November 2 @ 2:20 pm  Your physician recommends that you have lab work today: bmet/cbc/tsh/bnp/lft  A chest x-ray takes a picture of the organs and structures inside the chest, including the heart, lungs, and blood vessels. This test can show several things, including, whether the heart is enlarges; whether fluid is building up in the lungs; and whether pacemaker / defibrillator leads are still in place. [facility] on [place]   Your physician has requested that you have a lexiscan myoview for dyspnea, and 414.01For further information please visit [email or web address] Please follow instruction sheet, as given.   Your physician has recommended you make the following change in your medication: Hold Metoprolol if Systolic Blood Pressure in less than 100 and resume the following day Start Nitroglycerin if needed will explain today in office visit that was sent in to the [facility] at [place]

## 2012-12-07 ENCOUNTER — Telehealth (INDEPENDENT_AMBULATORY_CARE_PROVIDER_SITE_OTHER): Payer: Self-pay | Admitting: General Surgery

## 2012-12-07 NOTE — Telephone Encounter (Signed)
Pt is still having problems with alternating diarrhea/hard stools which cause constant flare ups of her internal hemorrhoids.  [employer] has denied her Anusol Rx.  She provided a number for Dr. Donell Beers to call to appeal this decision.  [phone].  I explained Dr. Donell Beers was out of the office this week, but I would give her this information when she returns.  Pt was appreciative.

## 2012-12-07 NOTE — Telephone Encounter (Signed)
Patient calling with a question about her anusol RX being covered by her [employer]. She states she has had conversations in the past about this RX with Dr Engineer, structural nurse and wants to only speak with them. I let her know I would pass along the message. Please call [phone].

## 2012-12-07 NOTE — Telephone Encounter (Signed)
pt notified about her lab results with verbal understanding

## 2012-12-08 ENCOUNTER — Telehealth: Payer: Self-pay | Admitting: Physician Assistant

## 2012-12-08 NOTE — Telephone Encounter (Signed)
New message    Talk to a nurse regarding her blood test results

## 2012-12-11 NOTE — Telephone Encounter (Signed)
cb pt and advised I s/w Lorin Picket W. PA and he advised for her to call her PCP to have hand looked at to make sure she does not have a bug bite or infection. Pt now states hand is better now and looks normal agaon and she will see PA 12/14/12 @ 2:20

## 2012-12-11 NOTE — Telephone Encounter (Signed)
Recommend evaluation by PCP. Tereso Newcomer, PA-C   12/11/2012 10:03 AM

## 2012-12-11 NOTE — Telephone Encounter (Signed)
pt said she noticed starting last night her left hand was swollen and said this morning is red compared to her right hand. Pt states when she pressures her finger on her hand it stays down for a few seconds before returing to normal. Pt denies any sob, or edema anywhere else. Pt advised I will call back with recommendations; pt verbalized Plan of Care.

## 2012-12-11 NOTE — Telephone Encounter (Signed)
pt said she noticed starting last night her left hand was swollen and said this morning is red compared to her right hand. Pt states when she pressures her finger on her hand it stays down for a few seconds before returing to normal. Pt denies any sob, or edema anywhere else. Pt advised I will call back with recommendations; pt verbalized Plan of Care. 

## 2012-12-14 ENCOUNTER — Ambulatory Visit: Payer: Medicare Other | Admitting: Physician Assistant

## 2012-12-16 ENCOUNTER — Encounter (INDEPENDENT_AMBULATORY_CARE_PROVIDER_SITE_OTHER): Payer: Self-pay | Admitting: General Surgery

## 2012-12-16 ENCOUNTER — Encounter (INDEPENDENT_AMBULATORY_CARE_PROVIDER_SITE_OTHER): Payer: Self-pay

## 2012-12-16 ENCOUNTER — Encounter: Payer: Self-pay | Admitting: Cardiology

## 2012-12-16 ENCOUNTER — Telehealth (INDEPENDENT_AMBULATORY_CARE_PROVIDER_SITE_OTHER): Payer: Self-pay

## 2012-12-16 NOTE — Telephone Encounter (Signed)
Made pt aware that a letter of medical necessity has been faxed to [facility].  As soon as we receive a response from them we will contact her.

## 2012-12-16 NOTE — Telephone Encounter (Signed)
[name], please mail or fax the letter I just wrote.    tx FB

## 2012-12-16 NOTE — Telephone Encounter (Signed)
Fax [phone]  Letter of medical necessity.  Meds tried and failed, diagnosis.  Reason for treatment.    Attn:  Benefit coverage review.

## 2012-12-23 ENCOUNTER — Encounter: Payer: Self-pay | Admitting: Cardiology

## 2012-12-23 ENCOUNTER — Ambulatory Visit (HOSPITAL_COMMUNITY): Payer: Medicare Other | Attending: Cardiology | Admitting: Radiology

## 2012-12-23 VITALS — BP 153/82 | HR 60 | Ht 66.0 in | Wt 129.0 lb

## 2012-12-23 DIAGNOSIS — Z8249 Family history of ischemic heart disease and other diseases of the circulatory system: Secondary | ICD-10-CM | POA: Insufficient documentation

## 2012-12-23 DIAGNOSIS — R0609 Other forms of dyspnea: Secondary | ICD-10-CM | POA: Insufficient documentation

## 2012-12-23 DIAGNOSIS — E119 Type 2 diabetes mellitus without complications: Secondary | ICD-10-CM | POA: Insufficient documentation

## 2012-12-23 DIAGNOSIS — R000 Tachycardia, unspecified: Secondary | ICD-10-CM | POA: Insufficient documentation

## 2012-12-23 DIAGNOSIS — R5383 Other fatigue: Secondary | ICD-10-CM

## 2012-12-23 DIAGNOSIS — R0600 Dyspnea, unspecified: Secondary | ICD-10-CM

## 2012-12-23 DIAGNOSIS — E785 Hyperlipidemia, unspecified: Secondary | ICD-10-CM | POA: Insufficient documentation

## 2012-12-23 DIAGNOSIS — I779 Disorder of arteries and arterioles, unspecified: Secondary | ICD-10-CM | POA: Insufficient documentation

## 2012-12-23 DIAGNOSIS — Z8673 Personal history of transient ischemic attack (TIA), and cerebral infarction without residual deficits: Secondary | ICD-10-CM | POA: Insufficient documentation

## 2012-12-23 DIAGNOSIS — I10 Essential (primary) hypertension: Secondary | ICD-10-CM | POA: Insufficient documentation

## 2012-12-23 DIAGNOSIS — R0989 Other specified symptoms and signs involving the circulatory and respiratory systems: Secondary | ICD-10-CM | POA: Insufficient documentation

## 2012-12-23 DIAGNOSIS — I447 Left bundle-branch block, unspecified: Secondary | ICD-10-CM | POA: Insufficient documentation

## 2012-12-23 DIAGNOSIS — I4892 Unspecified atrial flutter: Secondary | ICD-10-CM | POA: Insufficient documentation

## 2012-12-23 DIAGNOSIS — I2510 Atherosclerotic heart disease of native coronary artery without angina pectoris: Secondary | ICD-10-CM | POA: Insufficient documentation

## 2012-12-23 DIAGNOSIS — R0602 Shortness of breath: Secondary | ICD-10-CM

## 2012-12-23 DIAGNOSIS — M549 Dorsalgia, unspecified: Secondary | ICD-10-CM

## 2012-12-23 DIAGNOSIS — R5381 Other malaise: Secondary | ICD-10-CM | POA: Insufficient documentation

## 2012-12-23 MED ORDER — TECHNETIUM TC 99M SESTAMIBI GENERIC - CARDIOLITE
30.0000 | Freq: Once | INTRAVENOUS | Status: AC | PRN
  Administered 2012-12-23: 30 via INTRAVENOUS

## 2012-12-23 MED ORDER — TECHNETIUM TC 99M SESTAMIBI GENERIC - CARDIOLITE
10.0000 | Freq: Once | INTRAVENOUS | Status: AC | PRN
  Administered 2012-12-23: 10 via INTRAVENOUS

## 2012-12-23 MED ORDER — ADENOSINE (DIAGNOSTIC) 3 MG/ML IV SOLN
0.5600 mg/kg | Freq: Once | INTRAVENOUS | Status: AC
  Administered 2012-12-23: 32.7 mg via INTRAVENOUS

## 2012-12-23 NOTE — Progress Notes (Signed)
[facility] SITE 3 NUCLEAR MED [address] [place], [place] [address] [phone]    Cardiology Nuclear Med Study  [name] [name] is a 79 y.o. female     MRN : [id number]     DOB: [birth year]  Procedure Date: 12/23/2012  Nuclear Med Background Indication for Stress Test:  Evaluation for Ischemia History:  CAD; Atrial Flutter; Echo; '06 WUX:[id number], EF=75% Cardiac Risk Factors: Carotid Disease, Family History - CAD, Hypertension, LBBB, Lipids, NIDDM and TIA  Symptoms:  DOE, Fatigue and Rapid HR   Nuclear Pre-Procedure Caffeine/Decaff Intake:  None > 12 hrs NPO After: 0745 today a cracker  Lungs:  Clear. O2 Sat: 99% on room air. IV 0.9% NS with Angio Cath:  22g  IV Site: R Antecubital x 1, tolerated well IV Started by:  Irean Hong, RN  Chest Size (in):  34 Cup Size: B  Height: 5\' 6"  (1.676 m)  Weight:  129 lb (58.514 kg)  BMI:  Body mass index is 20.83 kg/(m^2). Tech Comments:  Lopressor taken this am    Nuclear Med Study 1 or 2 day study: 1 day  Stress Test Type:  Adenosine  Reading MD: [name], MD  Order Authorizing Provider:  Dietrich Pates, MD, and Tereso Newcomer, PA-C  Resting Radionuclide: Technetium 74m Sestamibi  Resting Radionuclide Dose: 11.0 mCi   Stress Radionuclide:  Technetium 19m Sestamibi  Stress Radionuclide Dose: 33.0 mCi           Stress Protocol Rest HR: 60 Stress HR: 80  Rest BP: 153/82 Stress BP: 136/52  Exercise Time (min): n/a METS: n/a   Predicted Max HR: 130 bpm % Max HR: 61.54 bpm Rate Pressure Product: 02725   Dose of Adenosine (mg):  32.8 Dose of Lexiscan: n/a mg  Dose of Atropine (mg): n/a Dose of Dobutamine: n/a mcg/kg/min (at max HR)  Stress Test Technologist: Smiley Houseman, CMA-N  Nuclear Technologist:  Domenic Polite, CNMT     Rest Procedure:  Myocardial perfusion imaging was performed at rest 45 minutes following the intravenous administration of Technetium 11m Sestamibi.  Rest ECG: NSR-LBBB  Stress  Procedure:  The patient received IV adenosine at 140 mcg/kg/min for 4 minutes.  Brief episode of complete heart block with infusion.  She c/o head and stomach pressure with infusion.  Technetium 36m Sestamibi was injected at the 2 minute mark and quantitative spect images were obtained after a 45 minute delay.  Stress ECG: No significant change from baseline ECG  QPS Raw Data Images:  Normal; no motion artifact; normal heart/lung ratio. Stress Images:  Normal homogeneous uptake in all areas of the myocardium. Rest Images:  Normal homogeneous uptake in all areas of the myocardium. Subtraction (SDS):  No evidence of ischemia. Transient Ischemic Dilatation (Normal <1.22):  1.00 Lung/Heart Ratio (Normal <0.45):  0.14  Quantitative Gated Spect Images QGS EDV:  65 ml QGS ESV:  22 ml  Impression Exercise Capacity:  Adenosine study with no exercise. BP Response:  Normal blood pressure response. Clinical Symptoms:  Atypical chest pain. ECG Impression:  No significant ST segment change suggestive of ischemia. Comparison with Prior Nuclear Study: No images to compare  Overall Impression:  Normal stress nuclear study.  LV Ejection Fraction: 67%.  LV Wall Motion:  NL LV Function; NL Wall Motion  [name], [name] 12/23/2012

## 2012-12-24 ENCOUNTER — Encounter: Payer: Self-pay | Admitting: Physician Assistant

## 2012-12-24 ENCOUNTER — Ambulatory Visit (INDEPENDENT_AMBULATORY_CARE_PROVIDER_SITE_OTHER): Payer: Medicare Other | Admitting: Physician Assistant

## 2012-12-24 VITALS — BP 152/76 | HR 71 | Ht 66.5 in | Wt 130.8 lb

## 2012-12-24 DIAGNOSIS — R0609 Other forms of dyspnea: Secondary | ICD-10-CM

## 2012-12-24 DIAGNOSIS — I10 Essential (primary) hypertension: Secondary | ICD-10-CM

## 2012-12-24 DIAGNOSIS — R0600 Dyspnea, unspecified: Secondary | ICD-10-CM

## 2012-12-24 DIAGNOSIS — I6529 Occlusion and stenosis of unspecified carotid artery: Secondary | ICD-10-CM

## 2012-12-24 DIAGNOSIS — I4892 Unspecified atrial flutter: Secondary | ICD-10-CM

## 2012-12-24 DIAGNOSIS — I2510 Atherosclerotic heart disease of native coronary artery without angina pectoris: Secondary | ICD-10-CM

## 2012-12-24 DIAGNOSIS — E785 Hyperlipidemia, unspecified: Secondary | ICD-10-CM

## 2012-12-24 NOTE — Patient Instructions (Signed)
Your physician recommends that you continue on your current medications as directed. Please refer to the Current Medication list given to you today.   Your physician recommends that you schedule a follow-up appointment in: Dr Delton See on Monday, February 23 @ 2:00

## 2012-12-24 NOTE — Progress Notes (Signed)
[address], [address] [place], [place]  [address] Phone: [phone] Fax:  [phone]  Date:  12/24/2012   ID:  [name], DOB Feb 02, 1921, MRN [id number]  PCP:  Kristian Covey, MD  Cardiologist:  Dr.  Shawnie Pons => Dr. [name]    History of Present Illness: [name] is a 79 y.o. female with a hx of CAD, prior TIA, carotid stenosis, s/p left CEA 11/2010, paroxysmal AFlutter, HTN, DM2, HL, LBBB. She has been felt to be a poor candidate for Coumadin. LHC 6/08: EF 55%, 1+ MR, Dx 50%, LAD 50%, dLAD 40%, OM1 50-60% (up to 70%), pOM 30%, pRCA 40%, mRCA 50-60%, pPDA 50-60% (up to 70%). Medical Rx was recommended. Echo 05/2012:  EF 60-65%, normal wall motion, Gr 1 DD, MAC, mild MR, PASP 32, reduced excursion of AV noncoronary cusp.  Carotid US 5/14: patent L CEA, RICA < 40%.  Event Monitor 3/14:  NSR.    I saw her recently for evaluation of dyspnea with exertion.  She has felt more short of breath with activities over the last couple of months.  Labs were obtained. These were fairly stable aside from a  minimally elevated BNP. I did not think that she needed diuresis. Myoview was performed yesterday and was considered a normal study.  Since last seen, she feels as though she is doing well. Her breathing is improved. She denies orthopnea, PND or edema. She denies chest pain or syncope.  Adenosine Myoview (12/23/12): Impression  Exercise Capacity: Adenosine study with no exercise.  BP Response: Normal blood pressure response.  Clinical Symptoms: Atypical chest pain.  ECG Impression: No significant ST segment change suggestive of ischemia.  Comparison with Prior Nuclear Study: No images to compare  Overall Impression: Normal stress nuclear study.  LV Ejection Fraction: 67%. LV Wall Motion: NL LV Function; NL Wall Motion  Recent Labs: 12/04/2012: ALT 20; Creatinine 1.0; Hemoglobin 11.7*; Potassium 4.4; Pro B Natriuretic peptide (BNP) 146.0*; TSH 3.18   Wt  Readings from Last 3 Encounters:  12/24/12 130 lb 12.8 oz (59.33 kg)  12/23/12 129 lb (58.514 kg)  12/04/12 128 lb (58.06 kg)     Past Medical History  Diagnosis Date  . COLONIC POLYPS, ADENOMATOUS 12/26/2009  . DIABETES MELLITUS, TYPE II 08/11/2006  . HYPERLIPIDEMIA 09/09/2006  . ANEMIA, NORMOCYTIC 11/30/2009  . HYPERTENSION 09/09/2006  . CAD, NATIVE VESSEL 04/27/2008    Cardiac catheterization 6/08: EF 55%, 1+ MR, Dx 50%, LAD 50%, dLAD 40%, OM1 50-60% (up to 70%), pOM 30%, pRCA 40%, mRCA 50-60%, pPDA 50-60% (up to 70%).  Medical therapy was recommended  . LEFT BUNDLE BRANCH BLOCK 08/11/2006  . Atrial flutter 04/12/2009  . TIA 09/02/2006    Echocardiogram 6/12: Mild to moderate MR, trace AI, trace TR, PASP 34, bubble study negative for intracardiac shunting, normal EF (greater than 55%)  . HEMORRHOIDS-INTERNAL 12/26/2009  . GERD 02/16/2007  . DIVERTICULOSIS, COLON 08/11/2006  . OSTEOARTHRITIS 08/11/2006  . OSTEOPOROSIS 08/11/2006  . Carotid stenosis     dopplers 6/12: LICA 50-79%    Current Outpatient Prescriptions  Medication Sig Dispense Refill  . Ascorbic Acid (VITAMIN C PO) Take by mouth.      Marland Kitchen aspirin 81 MG tablet Take 81 mg by mouth daily.        . calcium carbonate 200 MG capsule Take 250 mg by mouth 2 (two) times daily with a meal.        . Cholecalciferol (VITAMIN D PO) Take  1 tablet by mouth daily.       . clopidogrel (PLAVIX) 75 MG tablet TAKE 1 TABLET DAILY  90 tablet  2  . Cyanocobalamin (VITAMIN B 12 PO) Take 1 tablet by mouth daily.       . fish oil-omega-3 fatty acids 1000 MG capsule Take 1 g by mouth daily.       Marland Kitchen glucose blood test strip Use as instructed once a week      . metoprolol tartrate (LOPRESSOR) 25 MG tablet TAKE ONE-HALF (1/2) TABLET (12.5 MG TOTAL) TWICE A DAY  90 tablet  2  . Multiple Vitamin (MULTIVITAMIN) tablet Take 1 tablet by mouth daily.        . Multiple Vitamins-Minerals (OCUVITE ADULT 50+ PO) Take by mouth daily.        . nitroGLYCERIN  (NITROSTAT) 0.4 MG SL tablet Place 1 tablet (0.4 mg total) under the tongue every 5 (five) minutes as needed for chest pain.  25 tablet  3  . ONE TOUCH LANCETS MISC Use daily as directed  200 each  3  . pantoprazole (PROTONIX) 40 MG tablet TAKE 0.5 TABLET (1/2) EVERY OTHER DAY.  45 tablet  1  . simvastatin (ZOCOR) 40 MG tablet TAKE 1 TABLET AT BEDTIME  90 tablet  2  . VITAMIN E PO Take 1 tablet by mouth daily.       Marland Kitchen zolpidem (AMBIEN) 10 MG tablet Take 1 tablet (10 mg total) by mouth at bedtime as needed. For sleep  90 tablet  1   No current facility-administered medications for this visit.    Allergies:    Allergies  Allergen Reactions  . Doxycycline Hyclate     REACTION: upset stomach  . Nitrofurantoin     REACTION: unspecified  . Sulfamethoxazole-Trimethoprim     REACTION: unspecified  . Sulfonamide Derivatives     REACTION: rash    Social History:  The patient  reports that she has never smoked. She has never used smokeless tobacco. She reports that she drinks alcohol. She reports that she does not use illicit drugs.   Family History:  The patient's family history includes Cancer in her brother and sister; Heart disease in her brother; Heart disease (age of onset: 53) in her mother; Heart disease (age of onset: 27) in her father; Kidney disease in her mother.   ROS:  Please see the history of present illness.  She had some visual changes recently.  She notes recent epistaxis that has resolved.  She notes recent recurrence of her vertigo.  All other systems reviewed and negative.   PHYSICAL EXAM: VS:  BP 152/76  Pulse 71  Ht 5' 6.5" (1.689 m)  Wt 130 lb 12.8 oz (59.33 kg)  BMI 20.80 kg/m2 Well nourished, well developed, in no acute distress HEENT: normal Neck: no JVD Cardiac:  normal S1, S2; RRR; 1/6 systolic murmur at the RUSB Lungs:  Good air movement; no rales; no wheezing Abd: soft, nontender, no hepatomegaly Ext: no edema Skin: warm and dry Neuro:  CNs 2-12 intact,  no focal abnormalities noted  EKG:  NSR, HR 71, normal axis, LBBB     ASSESSMENT AND PLAN:  1. Dyspnea:  Etiology not clear. She feels improved. Recent Myoview low risk. No further cardiac workup warranted. 2. CAD:  Stable. No angina.  Continue ASA, Plavix and statin.   3. Atrial Flutter:  Maintaining NSR.  She is not felt to be a good candidate for coumadin. 4. Hypertension:  Controlled.  Continue current therapy. 5. Hyperlipidemia:  Continue statin. 6. Carotid Stenosis:  She had some recent visual disturbance. Carotid Dopplers have been performed in the last year. She remains on dual antiplatelet therapy. No further workup warranted at this time.  F/u with [facility]. 7. Disposition:  F/u with Dr. Delton See in 3 months.   Signed, Tereso Newcomer, PA-C  12/24/2012 10:16 AM

## 2012-12-28 ENCOUNTER — Encounter: Payer: Self-pay | Admitting: Family Medicine

## 2013-01-01 ENCOUNTER — Encounter (INDEPENDENT_AMBULATORY_CARE_PROVIDER_SITE_OTHER): Payer: Self-pay | Admitting: General Surgery

## 2013-01-01 ENCOUNTER — Ambulatory Visit (INDEPENDENT_AMBULATORY_CARE_PROVIDER_SITE_OTHER): Payer: Medicare Other | Admitting: General Surgery

## 2013-01-01 VITALS — BP 130/62 | HR 60 | Resp 14 | Ht 66.5 in | Wt 132.4 lb

## 2013-01-01 DIAGNOSIS — K648 Other hemorrhoids: Secondary | ICD-10-CM

## 2013-01-01 DIAGNOSIS — K644 Residual hemorrhoidal skin tags: Secondary | ICD-10-CM

## 2013-01-01 NOTE — Progress Notes (Signed)
Chief Complaint  Patient presents with  . Hemorrhoids    HISTORY: Patient is an 79 year old female whom I saw previously in October.   I banded circumferential large internal hemorrhoids at that time.  She has a pattern where she does really well right after local treatment. Since I injected her last, she has not developed recurrent bleeding, but she is starting to feel the sense of pressure and like they are going to prolapse again.  She has not gotten constipated since her last visit.  Her appeal for anusol suppositories was rejected by her insurance company.    EXAM: Filed Vitals:   01/01/13 1228  BP: 130/62  Pulse: 60  Resp: 14    Gen:  No acute distress.  Well nourished and well groomed.   Neurological: Alert and oriented to person, place, and time. Coordination normal.  Head: Normocephalic and atraumatic.  Eyes: Conjunctivae are normal. Pupils are equal, round, and reactive to light. No scleral icterus. Marland Kitchen   Respiratory: Effort normal.  No respiratory distress.  Rectal:  Small circumferential external hemorrhoids. One sl inflamed on right.   No thrombosis seen.  Anoscopy demonstrated smaller to moderate internal hemorrhoids primarily posteriorly on and left lateral.  These were much improved since last visit.  These were injected.     Musculoskeletal: Normal range of motion. Skin: Skin is warm and dry. No rash noted. No diaphoresis. No erythema. No pallor. No clubbing, cyanosis, or edema.   Psychiatric: Normal mood and affect. Behavior is normal. Judgment and thought content normal.    ASSESSMENT AND PLAN: Internal and external bleeding hemorrhoids Pt is going to try to find contact for peer to peer regarding the anusol suppositories.    She was advised to try to get script filled even if out of formulary.  I discussed the possibility of considering biofeedback since she has issues feeling when she has to have a BM.  She is to continue on her bowel regimen.     Maudry Diego MD Surgical Oncology, General and Endocrine Surgery [facility]    Visit Diagnoses: 1. Internal and external bleeding hemorrhoids     Primary Care Physician: Kristian Covey, MD

## 2013-01-01 NOTE — Assessment & Plan Note (Signed)
Pt is going to try to find contact for peer to peer regarding the anusol suppositories.    She was advised to try to get script filled even if out of formulary.  I discussed the possibility of considering biofeedback since she has issues feeling when she has to have a BM.  She is to continue on her bowel regimen.

## 2013-01-01 NOTE — Patient Instructions (Signed)
Try getting anusol suppositories.  Try using twice daily the week you have symptoms.  Follow up in 3 months.

## 2013-01-11 ENCOUNTER — Encounter (INDEPENDENT_AMBULATORY_CARE_PROVIDER_SITE_OTHER): Payer: Medicare Other | Admitting: General Surgery

## 2013-01-26 ENCOUNTER — Telehealth: Payer: Self-pay | Admitting: Family Medicine

## 2013-01-26 NOTE — Telephone Encounter (Signed)
Pt request refill of metoprolol tartrate (LOPRESSOR) 25 MG tablet 90 days [facility]

## 2013-01-27 MED ORDER — METOPROLOL TARTRATE 25 MG PO TABS: ORAL_TABLET | ORAL | Status: DC

## 2013-01-27 NOTE — Telephone Encounter (Signed)
Sent RX to pharmacy 

## 2013-02-18 ENCOUNTER — Telehealth: Payer: Self-pay | Admitting: Cardiology

## 2013-02-18 NOTE — Telephone Encounter (Signed)
Patient had pounding in her temples last night when she laid down to sleep.   This happens sometimes when she lays on her sides but was more persistent last night than normal.  She is feeling weak and tired. She gets SOB with activity, exertion which is unchanged. Denies lightheadedness, dizziness, nausea, cough.  Appetite is usual. BP this am 128/86, HR 86. Took scheduled dose of metoprolol. Instructed her to monitor her BP morning and night and call if any new symptoms or changing symptoms. Pt has appt on 2/23 with Dr. Meda Coffee.  She voices that she was a Dr. Lia Foyer patient and since he retired she has not seen a MD and needs to establish with a new physician.   Changed her 2/23 appt with Dr. Meda Coffee to 03/04/12. Also did offer her appt with [name] on 1/14, but she prefers to wait to see the cardiologist.

## 2013-02-18 NOTE — Telephone Encounter (Signed)
New Message  Pt called states that she has experienced a ponding in her temples.. Pt states that she has experienced this feeling all through the night// No rest// Requests a same day appt// Please call

## 2013-03-02 ENCOUNTER — Other Ambulatory Visit: Payer: Self-pay | Admitting: Family Medicine

## 2013-03-04 ENCOUNTER — Encounter: Payer: Self-pay | Admitting: Cardiology

## 2013-03-04 ENCOUNTER — Ambulatory Visit (INDEPENDENT_AMBULATORY_CARE_PROVIDER_SITE_OTHER): Payer: Medicare Other | Admitting: Cardiology

## 2013-03-04 VITALS — BP 140/62 | HR 75 | Ht 66.5 in | Wt 128.4 lb

## 2013-03-04 DIAGNOSIS — I4892 Unspecified atrial flutter: Secondary | ICD-10-CM

## 2013-03-04 NOTE — Patient Instructions (Signed)
Your physician recommends that you continue on your current medications as directed. Please refer to the Current Medication list given to you today.  Your physician recommends that you schedule a follow-up appointment in 3 months with Dr. Nelson   

## 2013-03-04 NOTE — Progress Notes (Signed)
Patient ID: [name], female   DOB: [birth year], 80 y.o.   MRN: [id number]            Date:  03/04/2013   ID:  [name], DOB 1920-05-15, MRN [id number]  PCP:  Eulas Post, MD  Cardiologist:  Dr.  Bing Quarry => Dr. Ena Dawley    History of Present Illness:  [name] is a 80 y.o. female with a hx of CAD, prior TIA, carotid stenosis, s/p left CEA 11/2010, paroxysmal AFlutter, HTN, DM2, HL, LBBB. She has been felt to be a poor candidate for Coumadin. LHC 6/08: EF 55%, 1+ MR, Dx 50%, LAD 50%, dLAD 40%, OM1 50-60% (up to 70%), pOM 30%, pRCA 40%, mRCA 50-60%, pPDA 50-60% (up to 70%). Medical Rx was recommended. Echo 05/2012:  EF 60-65%, normal wall motion, Gr 1 DD, MAC, mild MR, PASP 32, reduced excursion of AV noncoronary cusp.  Carotid US 5/14: patent L CEA, RICA < 40%.  Event Monitor 3/14:  NSR.    I saw her recently for evaluation of dyspnea with exertion.  She has felt more short of breath with activities over the last couple of months.  Labs were obtained. These were fairly stable aside from a  minimally elevated BNP. I did not think that she needed diuresis. Myoview was performed yesterday and was considered a normal study.  Since last seen, she feels as though she is doing well. Her breathing is improved. She denies orthopnea, PND or edema. She denies chest pain or syncope.  This is a 2 months follow up, no more episodes of SOB. The patient complains of weakness after she eats bowl of cereal with mild in the morning. She feels lightheaded with it. This happens before she drinks her daily coffee. It goes away after she takes rest. No syncope, no chest pain.  Adenosine Myoview (12/23/12): Impression  Exercise Capacity: Adenosine study with no exercise.  BP Response: Normal blood pressure response.  Clinical Symptoms: Atypical chest pain.  ECG Impression: No significant ST segment change suggestive of ischemia.  Comparison with Prior Nuclear Study: No images to  compare  Overall Impression: Normal stress nuclear study.  LV Ejection Fraction: 67%. LV Wall Motion: NL LV Function; NL Wall Motion  Recent Labs: 12/04/2012: ALT 20; Creatinine 1.0; Hemoglobin 11.7*; Potassium 4.4; Pro B Natriuretic peptide (BNP) 146.0*; TSH 3.18   Wt Readings from Last 3 Encounters:  03/04/13 128 lb 6.4 oz (58.242 kg)  01/01/13 132 lb 6.4 oz (60.056 kg)  12/24/12 130 lb 12.8 oz (59.33 kg)     Past Medical History  Diagnosis Date  . COLONIC POLYPS, ADENOMATOUS 12/26/2009  . DIABETES MELLITUS, TYPE II 08/11/2006  . HYPERLIPIDEMIA 09/09/2006  . ANEMIA, NORMOCYTIC 11/30/2009  . HYPERTENSION 09/09/2006  . CAD, NATIVE VESSEL 04/27/2008    Cardiac catheterization 6/08: EF 55%, 1+ MR, Dx 50%, LAD 50%, dLAD 40%, OM1 50-60% (up to 70%), pOM 30%, pRCA 40%, mRCA 50-60%, pPDA 50-60% (up to 70%).  Medical therapy was recommended  . LEFT BUNDLE BRANCH BLOCK 08/11/2006  . Atrial flutter 04/12/2009  . TIA 09/02/2006    Echocardiogram 6/12: Mild to moderate MR, trace AI, trace TR, PASP 34, bubble study negative for intracardiac shunting, normal EF (greater than 55%)  . HEMORRHOIDS-INTERNAL 12/26/2009  . GERD 02/16/2007  . DIVERTICULOSIS, COLON 08/11/2006  . OSTEOARTHRITIS 08/11/2006  . OSTEOPOROSIS 08/11/2006  . Carotid stenosis     dopplers [id number]: LICA [id number]    Current Outpatient Prescriptions  Medication Sig Dispense Refill  . Ascorbic Acid (VITAMIN C PO) Take by mouth.      Marland Kitchen aspirin 81 MG tablet Take 81 mg by mouth daily.        . calcium carbonate 200 MG capsule Take 250 mg by mouth 2 (two) times daily with a meal.        . Cholecalciferol (VITAMIN D PO) Take 1 tablet by mouth daily.       . clopidogrel (PLAVIX) 75 MG tablet TAKE 1 TABLET DAILY  90 tablet  1  . Cyanocobalamin (VITAMIN B 12 PO) Take 1 tablet by mouth daily.       . fish oil-omega-3 fatty acids 1000 MG capsule Take 1 g by mouth daily.       Marland Kitchen glucose blood test strip Use as instructed once a week      .  metoprolol tartrate (LOPRESSOR) 25 MG tablet TAKE ONE-HALF (1/2) TABLET (12.5 MG TOTAL) TWICE A DAY  90 tablet  2  . Multiple Vitamin (MULTIVITAMIN) tablet Take 1 tablet by mouth daily.        . Multiple Vitamins-Minerals (OCUVITE ADULT 50+ PO) Take by mouth daily.        . nitroGLYCERIN (NITROSTAT) 0.4 MG SL tablet Place 1 tablet (0.4 mg total) under the tongue every 5 (five) minutes as needed for chest pain.  25 tablet  3  . ONE TOUCH LANCETS MISC Use daily as directed  200 each  3  . pantoprazole (PROTONIX) 40 MG tablet TAKE 0.5 TABLET (1/2) EVERY OTHER DAY.  45 tablet  1  . simvastatin (ZOCOR) 40 MG tablet TAKE 1 TABLET AT BEDTIME  90 tablet  1  . VITAMIN E PO Take 1 tablet by mouth daily.       Marland Kitchen zolpidem (AMBIEN) 10 MG tablet Take 1 tablet (10 mg total) by mouth at bedtime as needed. For sleep  90 tablet  1   No current facility-administered medications for this visit.    Allergies:    Allergies  Allergen Reactions  . Doxycycline Hyclate     REACTION: upset stomach  . Nitrofurantoin     REACTION: unspecified  . Sulfamethoxazole-Trimethoprim     REACTION: unspecified  . Sulfonamide Derivatives     REACTION: rash    Social History:  The patient  reports that she has never smoked. She has never used smokeless tobacco. She reports that she drinks alcohol. She reports that she does not use illicit drugs.   Family History:  The patient's family history includes Cancer in her brother and sister; Heart disease in her brother; Heart disease (age of onset: 77) in her mother; Heart disease (age of onset: 3) in her father; Kidney disease in her mother.   ROS:  Please see the history of present illness.  She had some visual changes recently.  She notes recent epistaxis that has resolved.  She notes recent recurrence of her vertigo.  All other systems reviewed and negative.   PHYSICAL EXAM: VS:  BP 140/62  Pulse 75  Ht 5' 6.5" (1.689 m)  Wt 128 lb 6.4 oz (58.242 kg)  BMI 20.42  kg/m2 Well nourished, well developed, in no acute distress HEENT: normal Neck: no JVD Cardiac:  normal S1, S2; RRR; 1/6 systolic murmur at the RUSB Lungs:  Good air movement; no rales; no wheezing Abd: soft, nontender, no hepatomegaly Ext: no edema Skin: warm and dry Neuro:  CNs 2-12 intact, no focal abnormalities noted  EKG:  NSR, HR 71, normal axis, LBBB     ASSESSMENT AND PLAN:  1. Weakness/dizziness, might be secondary to low BP in the morning, her BP today is borderline 140/62, we will not change her meds right now. She is asked to check her BP during one of weakness episodes. She is also advised to have her coffee earlier in the morning as later in the day she feels fine.  2. Dyspnea:  Etiology not clear. She feels improved. Recent Myoview low risk. No further cardiac workup warranted.  3. CAD:  Stable. No angina.  Continue ASA, Plavix and statin.    4. Atrial Flutter:  Maintaining NSR.  She is not felt to be a good candidate for coumadin.  5. Hypertension:  Controlled.  Continue current therapy.  6. Hyperlipidemia:  Continue statin.  7. Carotid Stenosis:  Carotid Dopplers have been performed in the last year, scheduled again for May. She remains on dual antiplatelet therapy. No further workup warranted at this time.  F/u with [facility].  1. Disposition:  Follow up in 3 months.    Ena Dawley, Lemmie Evens 03/04/2013  03/04/2013 2:34 PM

## 2013-03-12 ENCOUNTER — Encounter: Payer: Self-pay | Admitting: Family Medicine

## 2013-03-12 ENCOUNTER — Ambulatory Visit (INDEPENDENT_AMBULATORY_CARE_PROVIDER_SITE_OTHER): Payer: Medicare Other | Admitting: Family Medicine

## 2013-03-12 VITALS — BP 130/70 | HR 68 | Wt 128.0 lb

## 2013-03-12 DIAGNOSIS — E119 Type 2 diabetes mellitus without complications: Secondary | ICD-10-CM

## 2013-03-12 DIAGNOSIS — K3189 Other diseases of stomach and duodenum: Secondary | ICD-10-CM

## 2013-03-12 DIAGNOSIS — R1013 Epigastric pain: Secondary | ICD-10-CM

## 2013-03-12 LAB — HEMOGLOBIN A1C: HEMOGLOBIN A1C: 6.7 % — AB (ref 4.6–6.5)

## 2013-03-12 NOTE — Patient Instructions (Signed)
Take Protonix regularly for next 2 weeks then consider every other day- if symptoms are controlled.

## 2013-03-12 NOTE — Progress Notes (Signed)
Pre visit review using our clinic review tool, if applicable. No additional management support is needed unless otherwise documented below in the visit note. 

## 2013-03-12 NOTE — Progress Notes (Signed)
Subjective:    Patient ID: [name], female    DOB: [birth year], 80 y.o.   MRN: [id number]  HPI Patient is seen with complaints of " weak in the stomach". She has some difficulty being specific. She states over the past couple week she's had frequent burping after breakfast and felt somewhat weak about 30 minutes after breakfast. She noticed after eating Cheerios. She wondered if she may have had some relative hypoglycemia. She does not take any hypoglycemic medications. She does have history of type 2 diabetes which is been managed by diet. She started back protonic one half tablet daily and symptoms have improved past few days. She denies any recent chest pains. No dyspnea. No melena. No bowel pain. No recent dysuria. No pleuritic pain. She's had good appetite and weight has been stable.  Other chronic problems include history of hypertension, hyperlipidemia, type 2 diabetes, peripheral vascular disease, CAD, benign positional vertigo, atrial flutter  Past Medical History  Diagnosis Date  . COLONIC POLYPS, ADENOMATOUS 12/26/2009  . DIABETES MELLITUS, TYPE II 08/11/2006  . HYPERLIPIDEMIA 09/09/2006  . ANEMIA, NORMOCYTIC 11/30/2009  . HYPERTENSION 09/09/2006  . CAD, NATIVE VESSEL 04/27/2008    Cardiac catheterization 6/08: EF 55%, 1+ MR, Dx 50%, LAD 50%, dLAD 40%, OM1 50-60% (up to 70%), pOM 30%, pRCA 40%, mRCA 50-60%, pPDA 50-60% (up to 70%).  Medical therapy was recommended  . LEFT BUNDLE BRANCH BLOCK 08/11/2006  . Atrial flutter 04/12/2009  . TIA 09/02/2006    Echocardiogram 6/12: Mild to moderate MR, trace AI, trace TR, PASP 34, bubble study negative for intracardiac shunting, normal EF (greater than 55%)  . HEMORRHOIDS-INTERNAL 12/26/2009  . GERD 02/16/2007  . DIVERTICULOSIS, COLON 08/11/2006  . OSTEOARTHRITIS 08/11/2006  . OSTEOPOROSIS 08/11/2006  . Carotid stenosis     dopplers 2/45: LICA 80-99%   Past Surgical History  Procedure Laterality Date  . Tonsillectomy    . Eye surgery        catarac  . Closed reduction metatarsal fracture      right 5th  . Colonoscopy w/ polypectomy  2001    reports that she has never smoked. She has never used smokeless tobacco. She reports that she drinks alcohol. She reports that she does not use illicit drugs. family history includes Cancer in her brother and sister; Heart disease in her brother; Heart disease (age of onset: 37) in her mother; Heart disease (age of onset: 75) in her father; Kidney disease in her mother. Allergies  Allergen Reactions  . Doxycycline Hyclate     REACTION: upset stomach  . Nitrofurantoin     REACTION: unspecified  . Sulfamethoxazole-Trimethoprim     REACTION: unspecified  . Sulfonamide Derivatives     REACTION: rash      Review of Systems  Constitutional: Negative for fever, chills, appetite change and unexpected weight change.  HENT: Negative for trouble swallowing.   Respiratory: Negative for cough and shortness of breath.   Cardiovascular: Negative for chest pain.  Gastrointestinal: Negative for nausea, vomiting, abdominal pain, diarrhea, constipation and abdominal distention.  Endocrine: Negative for polydipsia and polyuria.  Genitourinary: Negative for dysuria.  Neurological: Negative for dizziness and syncope.  Psychiatric/Behavioral: Negative for confusion.       Objective:   Physical Exam  Constitutional: She appears well-developed and well-nourished.  HENT:  Mouth/Throat: Oropharynx is clear and moist.  Neck: Neck supple. No thyromegaly present.  Cardiovascular: Normal rate and regular rhythm.   Pulmonary/Chest: Effort normal and breath sounds normal.  No respiratory distress. She has no wheezes. She has no rales.  Abdominal: Soft. Bowel sounds are normal. She exhibits no distension and no mass. There is no tenderness. There is no rebound and no guarding.  Musculoskeletal: She exhibits no edema.  Neurological: She is alert.          Assessment & Plan:  Patient presents with  vague symptoms of mild dyspepsia. Question GERD related. Her symptoms have improved with starting back protonix Would recommend continuing this for the next couple of weeks then try tapering back off.  Type 2 diabetes with excellent control off medications. Recheck A1c.

## 2013-03-16 ENCOUNTER — Telehealth: Payer: Self-pay

## 2013-03-16 NOTE — Telephone Encounter (Signed)
Relevant patient education assigned to patient using Emmi. ° °

## 2013-03-29 ENCOUNTER — Other Ambulatory Visit: Payer: Self-pay | Admitting: Vascular Surgery

## 2013-03-29 DIAGNOSIS — I6529 Occlusion and stenosis of unspecified carotid artery: Secondary | ICD-10-CM

## 2013-03-29 DIAGNOSIS — Z48812 Encounter for surgical aftercare following surgery on the circulatory system: Secondary | ICD-10-CM

## 2013-04-05 ENCOUNTER — Encounter (INDEPENDENT_AMBULATORY_CARE_PROVIDER_SITE_OTHER): Payer: Self-pay | Admitting: General Surgery

## 2013-04-05 ENCOUNTER — Ambulatory Visit (INDEPENDENT_AMBULATORY_CARE_PROVIDER_SITE_OTHER): Payer: Medicare Other | Admitting: General Surgery

## 2013-04-05 ENCOUNTER — Ambulatory Visit: Payer: Medicare Other | Admitting: Cardiology

## 2013-04-05 VITALS — BP 146/64 | HR 76 | Temp 98.1°F | Resp 18 | Ht 66.5 in | Wt 128.8 lb

## 2013-04-05 DIAGNOSIS — K644 Residual hemorrhoidal skin tags: Secondary | ICD-10-CM

## 2013-04-05 DIAGNOSIS — K648 Other hemorrhoids: Secondary | ICD-10-CM

## 2013-04-05 MED ORDER — HYDROCORTISONE ACETATE 25 MG RE SUPP: 25.0000 mg | Freq: Two times a day (BID) | RECTAL | Status: DC

## 2013-04-05 NOTE — Assessment & Plan Note (Signed)
Pt to get suppositories even though insurance has not approved it.  Follow up in 4-6 weeks.

## 2013-04-05 NOTE — Patient Instructions (Signed)
Try suppositories.  Follow up in 4-6 weeks.

## 2013-04-05 NOTE — Progress Notes (Signed)
Chief Complaint  Patient presents with  . Routine Post Op    reck hems    HISTORY: Patient is an 80 year old female with circumferential internal and external hemorrhoids whom I banded in October, then injected in November.   She continues to have issues with a prolapsed hemorrhoid.  She constantly feels like she needs to have a bowel movement, but then has issues with being able to evacuate the stool.  She spends quite a bit of time on the toilet.  She takes miralax often, and occasional stool softeners.  She continues to have bleeding when she wipes.    EXAM: Filed Vitals:   04/05/13 1125  BP: 146/64  Pulse: 76  Temp: 98.1 F (36.7 C)  Resp: 18    Gen:  No acute distress.  Well nourished and well groomed.   Neurological: Alert and oriented to person, place, and time. Coordination normal.  Head: Normocephalic and atraumatic.  Eyes: Conjunctivae are normal. Pupils are equal, round, and reactive to light. No scleral icterus. Marland Kitchen   Respiratory: Effort normal.  No respiratory distress.  Rectal:  Small circumferential external hemorrhoids. Prolapsed internal hemorrhoid with irritation.  Anoscopy demonstrates improvement in internal hemorrhoids except on posterior left.  This is location of moderate sized prolapsed hemorrhoid.  This is banded.  Pt tolerated well.   Musculoskeletal: Normal range of motion. Skin: Skin is warm and dry. No rash noted. No diaphoresis. No erythema. No pallor. No clubbing, cyanosis, or edema.   Psychiatric: Normal mood and affect. Behavior is normal. Judgment and thought content normal.    ASSESSMENT AND PLAN: Internal and external bleeding hemorrhoids Pt to get suppositories even though insurance has not approved it.  Follow up in 4-6 weeks.      Milus Height MD Surgical [facility]    Visit Diagnoses: 1. Internal and external bleeding hemorrhoids     Primary Care  Physician: Eulas Post, MD

## 2013-04-06 ENCOUNTER — Telehealth (INDEPENDENT_AMBULATORY_CARE_PROVIDER_SITE_OTHER): Payer: Self-pay | Admitting: General Surgery

## 2013-04-06 NOTE — Telephone Encounter (Signed)
Pt called to let Dr. Barry Dienes know the cost of the Anusol suppositories is too high (> $ 300.) and she has decided NOT to get them after all.  FYI only.

## 2013-05-03 LAB — HM DIABETES EYE EXAM

## 2013-05-07 ENCOUNTER — Encounter (INDEPENDENT_AMBULATORY_CARE_PROVIDER_SITE_OTHER): Payer: Self-pay | Admitting: General Surgery

## 2013-05-07 ENCOUNTER — Ambulatory Visit (INDEPENDENT_AMBULATORY_CARE_PROVIDER_SITE_OTHER): Payer: Medicare Other | Admitting: General Surgery

## 2013-05-07 VITALS — BP 118/76 | HR 72 | Temp 98.5°F | Resp 14 | Ht 66.0 in | Wt 128.0 lb

## 2013-05-07 DIAGNOSIS — K648 Other hemorrhoids: Secondary | ICD-10-CM

## 2013-05-07 DIAGNOSIS — K644 Residual hemorrhoidal skin tags: Secondary | ICD-10-CM

## 2013-05-07 MED ORDER — HYDROCORTISONE 2.5 % RE CREA: 1.0000 "application " | TOPICAL_CREAM | Freq: Two times a day (BID) | RECTAL | Status: DC

## 2013-05-07 NOTE — Assessment & Plan Note (Signed)
Banded posterior hemorrhoid.  Refilled anusol HC.  Follow up in 6 months

## 2013-05-07 NOTE — Progress Notes (Signed)
Chief Complaint  Patient presents with  . Routine Post Op    HISTORY: Patient is an 80 year old female with circumferential internal and external hemorrhoids whom I banded in October, then injected in November, banded last month.  She is doing a bit better and is bothered less.  She still has some sensation of prolapse.  She is not having bleeding.  She otherwise is doing OK.  She is taking fiber supplement.    EXAM: Filed Vitals:   05/07/13 1127  BP: 118/76  Pulse: 72  Temp: 98.5 F (36.9 C)  Resp: 14    Gen:  No acute distress.  Well nourished and well groomed.   Neurological: Alert and oriented to person, place, and time. Coordination normal.  Head: Normocephalic and atraumatic.  Respiratory: Effort normal.  No respiratory distress.  Rectal:  Small circumferential external hemorrhoids. Prolapsed internal hemorrhoid with irritation.  Anoscopy demonstrated posterior hemorrhoid that is enlarged.  This is banded.   Musculoskeletal: Normal range of motion. Skin: Skin is warm and dry. No rash noted. No diaphoresis. No erythema. No pallor. No clubbing, cyanosis, or edema.   Psychiatric: Normal mood and affect. Behavior is normal. Judgment and thought content normal.    ASSESSMENT AND PLAN: Internal and external bleeding hemorrhoids Banded posterior hemorrhoid.  Refilled anusol HC.  Follow up in 6 months    Milus Height MD Surgical Oncology[facility]    Visit Diagnoses: 1. Internal and external bleeding hemorrhoids     Primary Care Physician: Eulas Post, MD

## 2013-05-07 NOTE — Patient Instructions (Signed)
Continue anusol.  Continue avoiding spending too much time on the toilet.  Follow up in 6 months.

## 2013-06-07 ENCOUNTER — Encounter: Payer: Self-pay | Admitting: *Deleted

## 2013-06-08 ENCOUNTER — Ambulatory Visit (INDEPENDENT_AMBULATORY_CARE_PROVIDER_SITE_OTHER): Payer: Medicare Other | Admitting: Cardiology

## 2013-06-08 ENCOUNTER — Encounter: Payer: Self-pay | Admitting: Cardiology

## 2013-06-08 VITALS — BP 140/50 | HR 78 | Ht 66.5 in | Wt 126.0 lb

## 2013-06-08 DIAGNOSIS — J302 Other seasonal allergic rhinitis: Secondary | ICD-10-CM

## 2013-06-08 DIAGNOSIS — I10 Essential (primary) hypertension: Secondary | ICD-10-CM

## 2013-06-08 DIAGNOSIS — J309 Allergic rhinitis, unspecified: Secondary | ICD-10-CM

## 2013-06-08 LAB — COMPREHENSIVE METABOLIC PANEL WITH GFR
ALT: 23 U/L (ref 0–35)
AST: 31 U/L (ref 0–37)
Albumin: 4 g/dL (ref 3.5–5.2)
Alkaline Phosphatase: 31 U/L — ABNORMAL LOW (ref 39–117)
BUN: 24 mg/dL — ABNORMAL HIGH (ref 6–23)
CO2: 25 meq/L (ref 19–32)
Calcium: 9.5 mg/dL (ref 8.4–10.5)
Chloride: 103 meq/L (ref 96–112)
Creatinine, Ser: 1 mg/dL (ref 0.4–1.2)
GFR: 57.21 mL/min — ABNORMAL LOW
Glucose, Bld: 116 mg/dL — ABNORMAL HIGH (ref 70–99)
Potassium: 4.4 meq/L (ref 3.5–5.1)
Sodium: 137 meq/L (ref 135–145)
Total Bilirubin: 0.6 mg/dL (ref 0.3–1.2)
Total Protein: 7.5 g/dL (ref 6.0–8.3)

## 2013-06-08 NOTE — Patient Instructions (Addendum)
Your physician has recommended you make the following change in your medication:   1. Start Allegra 180 mg 1 tablet daily.   Your physician recommends that you return for lab work today for cmet.  Your physician recommends that you schedule a follow-up appointment in 3 months with Dr. Meda Coffee.

## 2013-06-08 NOTE — Progress Notes (Signed)
Patient ID: [name], female   DOB: 16-Jun-1920, 80 y.o.   MRN: [id number]    Date:  06/08/2013  PCP:  Eulas Post, MD  Cardiologist:  Dr.  Bing Quarry => Dr. Ena Dawley    History of Present Illness:  [name] is a 80 y.o. female with a hx of CAD, prior TIA, carotid stenosis, s/p left CEA 11/2010, paroxysmal A-Flutter, HTN, DM2, HL, LBBB. She has been felt to be a poor candidate for Coumadin. LHC 6/08: EF 55%, 1+ MR, Dx 50%, LAD 50%, dLAD 40%, OM1 50-60% (up to 70%), pOM 30%, pRCA 40%, mRCA 50-60%, pPDA 50-60% (up to 70%). Medical Rx was recommended. Echo 05/2012:  EF 60-65%, normal wall motion, Gr 1 DD, MAC, mild MR, PASP 32 mmHg, reduced excursion of AV noncoronary cusp.  Carotid US 5/14: patent L CEA, RICA < 40%.  Event Monitor 3/14:  NSR.    I saw her recently for evaluation of dyspnea with exertion.  She has felt more short of breath with activities over the last couple of months.  Labs were obtained. These were fairly stable aside from a  minimally elevated BNP. I did not think that she needed diuresis. Myoview was performed yesterday and was considered a normal study.  Since last seen, she feels as though she is doing well. Her breathing is improved. She denies orthopnea, PND or edema. She denies chest pain or syncope.  03/03/2013 - This is a 2 months follow up, no more episodes of SOB. The patient complains of weakness after she eats bowl of cereal with mild in the morning. She feels lightheaded with it. This happens before she drinks her daily coffee. It goes away after she takes rest. No syncope, no chest pain.  06/08/2013 - morning dizziness resolved. She denies chest pain or SOB. She is very complaint with her meds. She is completely on the top of her problems. Her complain today is overall lack of energy, fatigue and decreased appetite. She is also experiencing seasonal allergies and inquiring about proper non-sedating antihistamins. Also complaining of LE muscle  stiffness.  Adenosine Myoview (12/23/12): Impression  Exercise Capacity: Adenosine study with no exercise.  BP Response: Normal blood pressure response.  Clinical Symptoms: Atypical chest pain.  ECG Impression: No significant ST segment change suggestive of ischemia.  Comparison with Prior Nuclear Study: No images to compare  Overall Impression: Normal stress nuclear study.  LV Ejection Fraction: 67%. LV Wall Motion: NL LV Function; NL Wall Motion  Recent Labs: 12/04/2012: ALT 20; Creatinine 1.0; Hemoglobin 11.7*; Potassium 4.4; Pro B Natriuretic peptide (BNP) 146.0*; TSH 3.18   Wt Readings from Last 3 Encounters:  06/08/13 126 lb (57.153 kg)  05/07/13 128 lb (58.06 kg)  04/05/13 128 lb 12.8 oz (58.423 kg)     Past Medical History  Diagnosis Date  . COLONIC POLYPS, ADENOMATOUS 12/26/2009  . DIABETES MELLITUS, TYPE II 08/11/2006  . HYPERLIPIDEMIA 09/09/2006  . ANEMIA, NORMOCYTIC 11/30/2009  . HYPERTENSION 09/09/2006  . CAD, NATIVE VESSEL 04/27/2008    Cardiac catheterization 6/08: EF 55%, 1+ MR, Dx 50%, LAD 50%, dLAD 40%, OM1 50-60% (up to 70%), pOM 30%, pRCA 40%, mRCA 50-60%, pPDA 50-60% (up to 70%).  Medical therapy was recommended  . LEFT BUNDLE BRANCH BLOCK 08/11/2006  . Atrial flutter 04/12/2009  . TIA 09/02/2006    Echocardiogram 6/12: Mild to moderate MR, trace AI, trace TR, PASP 34, bubble study negative for intracardiac shunting, normal EF (greater than 55%)  .  HEMORRHOIDS-INTERNAL 12/26/2009  . GERD 02/16/2007  . DIVERTICULOSIS, COLON 08/11/2006  . OSTEOARTHRITIS 08/11/2006  . OSTEOPOROSIS 08/11/2006  . Carotid stenosis     dopplers 1/66: LICA 06-30%  . Hemorrhoids     Current Outpatient Prescriptions  Medication Sig Dispense Refill  . Ascorbic Acid (VITAMIN C PO) Take by mouth.      Marland Kitchen aspirin 81 MG tablet Take 81 mg by mouth daily.        . calcium carbonate 200 MG capsule Take 250 mg by mouth 2 (two) times daily with a meal.        . Cholecalciferol (VITAMIN D PO) Take  1 tablet by mouth daily.       . clopidogrel (PLAVIX) 75 MG tablet TAKE 1 TABLET DAILY  90 tablet  1  . Cyanocobalamin (VITAMIN B 12 PO) Take 1 tablet by mouth daily.       . fish oil-omega-3 fatty acids 1000 MG capsule Take 1 g by mouth daily.       Marland Kitchen glucose blood test strip Use as instructed once a week      . hydrocortisone (ANUSOL-HC) 2.5 % rectal cream Place 1 application rectally 2 (two) times daily.  90 g  3  . hydrocortisone (ANUSOL-HC) 25 MG suppository Place 1 suppository (25 mg total) rectally 2 (two) times daily.  30 suppository  3  . metoprolol tartrate (LOPRESSOR) 25 MG tablet TAKE ONE-HALF (1/2) TABLET (12.5 MG TOTAL) TWICE A DAY  90 tablet  2  . Multiple Vitamin (MULTIVITAMIN) tablet Take 1 tablet by mouth daily.        . Multiple Vitamins-Minerals (OCUVITE ADULT 50+ PO) Take by mouth daily.        . nitroGLYCERIN (NITROSTAT) 0.4 MG SL tablet Place 1 tablet (0.4 mg total) under the tongue every 5 (five) minutes as needed for chest pain.  25 tablet  3  . ONE TOUCH LANCETS MISC Use daily as directed  200 each  3  . pantoprazole (PROTONIX) 40 MG tablet TAKE 0.5 TABLET (1/2) EVERY OTHER DAY.  45 tablet  1  . simvastatin (ZOCOR) 40 MG tablet TAKE 1 TABLET AT BEDTIME  90 tablet  1  . VITAMIN E PO Take 1 tablet by mouth daily.       Marland Kitchen zolpidem (AMBIEN) 10 MG tablet Take 1 tablet (10 mg total) by mouth at bedtime as needed. For sleep  90 tablet  1   No current facility-administered medications for this visit.    Allergies:    Allergies  Allergen Reactions  . Doxycycline Hyclate     REACTION: upset stomach  . Nitrofurantoin     REACTION: unspecified  . Sulfamethoxazole-Trimethoprim     REACTION: unspecified  . Sulfonamide Derivatives     REACTION: rash    Social History:  The patient  reports that she has never smoked. She has never used smokeless tobacco. She reports that she drinks alcohol. She reports that she does not use illicit drugs.   Family History:  The patient's  family history includes Breast cancer in her sister; Colon cancer in her brother; Congestive Heart Failure (age of onset: 33) in her father; Heart attack in her mother; Heart disease in her brother; Heart disease (age of onset: 82) in her mother; Kidney disease in her mother; Uterine cancer in her sister.   ROS:  Please see the history of present illness.  She had some visual changes recently.  She notes recent epistaxis that has resolved.  She notes recent recurrence of her vertigo.  All other systems reviewed and negative.   PHYSICAL EXAM: VS:  BP 140/50  Pulse 78  Ht 5' 6.5" (1.689 m)  Wt 126 lb (57.153 kg)  BMI 20.03 kg/m2 Well nourished, well developed, in no acute distress HEENT: normal Neck: no JVD Cardiac:  normal S1, S2; RRR; 1/6 systolic murmur at the RUSB Lungs:  Good air movement; no rales; no wheezing Abd: soft, nontender, no hepatomegaly Ext: no edema Skin: warm and dry Neuro:  CNs 2-12 intact, no focal abnormalities noted  EKG:  NSR, HR 71, normal axis, LBBB      ASSESSMENT AND PLAN:  1. Fatigue, decreased appetite - might be relate to aging and mild depression after a long winter without physical activity. Her morning dizziness has resolved. She is encouraged to perform even minimal activities outside and mild walking.   2. Dyspnea:  Etiology not clear. Resolved. Recent Myoview low risk. No further cardiac workup warranted.  3. CAD:  Stable. No angina.  Continue ASA, Plavix and statin.    4. Atrial Flutter:  Maintaining NSR.  She is not felt to be a good candidate for coumadin.  5. Hypertension:  Controlled.  Continue current therapy.  6. Hyperlipidemia:  Continue statin.  7. Carotid Stenosis:  Carotid Dopplers have been performed in the last year, scheduled again for May. She remains on dual antiplatelet therapy. No further workup warranted at this time.  F/u with [facility].  8. Seasonal allergies - advised to take allegra 180 mg po daily  9. Muscle stiffness - we  will check liver enzymes today  1. Disposition:  Follow up in 3 months.    Dorothy Spark 06/08/2013

## 2013-06-21 ENCOUNTER — Encounter: Payer: Self-pay | Admitting: Vascular Surgery

## 2013-06-22 ENCOUNTER — Ambulatory Visit (HOSPITAL_COMMUNITY)
Admission: RE | Admit: 2013-06-22 | Discharge: 2013-06-22 | Disposition: A | Discharge status: Home / Self Care | Payer: Medicare Other | Source: Ambulatory Visit | Attending: Vascular Surgery | Admitting: Vascular Surgery

## 2013-06-22 ENCOUNTER — Encounter: Payer: Self-pay | Admitting: Vascular Surgery

## 2013-06-22 ENCOUNTER — Ambulatory Visit (INDEPENDENT_AMBULATORY_CARE_PROVIDER_SITE_OTHER): Payer: Medicare Other | Admitting: Vascular Surgery

## 2013-06-22 VITALS — BP 174/73 | Resp 18 | Ht 66.5 in | Wt 127.3 lb

## 2013-06-22 DIAGNOSIS — Z48812 Encounter for surgical aftercare following surgery on the circulatory system: Secondary | ICD-10-CM | POA: Insufficient documentation

## 2013-06-22 DIAGNOSIS — I6529 Occlusion and stenosis of unspecified carotid artery: Secondary | ICD-10-CM | POA: Insufficient documentation

## 2013-06-22 NOTE — Progress Notes (Signed)
Here today for followup of extracranial cerebrovascular occlusive disease. She is in great health quite active for her age of 38. She denies any significant neurologic deficits. No amaurosis fugax transient ischemic attack or stroke.  Past Medical History  Diagnosis Date  . COLONIC POLYPS, ADENOMATOUS 12/26/2009  . DIABETES MELLITUS, TYPE II 08/11/2006  . HYPERLIPIDEMIA 09/09/2006  . ANEMIA, NORMOCYTIC 11/30/2009  . HYPERTENSION 09/09/2006  . CAD, NATIVE VESSEL 04/27/2008    Cardiac catheterization 6/08: EF 55%, 1+ MR, Dx 50%, LAD 50%, dLAD 40%, OM1 50-60% (up to 70%), pOM 30%, pRCA 40%, mRCA 50-60%, pPDA 50-60% (up to 70%).  Medical therapy was recommended  . LEFT BUNDLE BRANCH BLOCK 08/11/2006  . Atrial flutter 04/12/2009  . TIA 09/02/2006    Echocardiogram 6/12: Mild to moderate MR, trace AI, trace TR, PASP 34, bubble study negative for intracardiac shunting, normal EF (greater than 55%)  . HEMORRHOIDS-INTERNAL 12/26/2009  . GERD 02/16/2007  . DIVERTICULOSIS, COLON 08/11/2006  . OSTEOARTHRITIS 08/11/2006  . OSTEOPOROSIS 08/11/2006  . Carotid stenosis     dopplers 8/46: LICA 96-29%  . Hemorrhoids     History  Substance Use Topics  . Smoking status: Never Smoker   . Smokeless tobacco: Never Used  . Alcohol Use: Yes     Comment: 1 glass of wine with dinner every now and then    Family History  Problem Relation Age of Onset  . Heart disease Mother 64  . Kidney disease Mother   . Congestive Heart Failure Father 64  . Breast cancer Sister   . Colon cancer Brother   . Heart disease Brother   . Uterine cancer Sister   . Heart attack Mother     Allergies  Allergen Reactions  . Doxycycline Hyclate     REACTION: upset stomach  . Nitrofurantoin     REACTION: unspecified  . Sulfamethoxazole-Trimethoprim     REACTION: unspecified  . Sulfonamide Derivatives     REACTION: rash    Current outpatient prescriptions:Ascorbic Acid (VITAMIN C PO), Take by mouth., Disp: , Rfl: ;  aspirin 81  MG tablet, Take 81 mg by mouth daily.  , Disp: , Rfl: ;  calcium carbonate 200 MG capsule, Take 250 mg by mouth 2 (two) times daily with a meal.  , Disp: , Rfl: ;  Cholecalciferol (VITAMIN D PO), Take 1 tablet by mouth daily. , Disp: , Rfl: ;  clopidogrel (PLAVIX) 75 MG tablet, TAKE 1 TABLET DAILY, Disp: 90 tablet, Rfl: 1 Cyanocobalamin (VITAMIN B 12 PO), Take 1 tablet by mouth daily. , Disp: , Rfl: ;  fish oil-omega-3 fatty acids 1000 MG capsule, Take 1 g by mouth daily. , Disp: , Rfl: ;  glucose blood test strip, Use as instructed once a week, Disp: , Rfl: ;  hydrocortisone (ANUSOL-HC) 2.5 % rectal cream, Place 1 application rectally 2 (two) times daily., Disp: 90 g, Rfl: 3 metoprolol tartrate (LOPRESSOR) 25 MG tablet, TAKE ONE-HALF (1/2) TABLET (12.5 MG TOTAL) TWICE A DAY, Disp: 90 tablet, Rfl: 2;  Multiple Vitamin (MULTIVITAMIN) tablet, Take 1 tablet by mouth daily.  , Disp: , Rfl: ;  Multiple Vitamins-Minerals (OCUVITE ADULT 50+ PO), Take by mouth daily.  , Disp: , Rfl: ;  ONE TOUCH LANCETS MISC, Use daily as directed, Disp: 200 each, Rfl: 3 pantoprazole (PROTONIX) 40 MG tablet, TAKE 0.5 TABLET (1/2) EVERY OTHER DAY., Disp: 45 tablet, Rfl: 1;  simvastatin (ZOCOR) 40 MG tablet, TAKE 1 TABLET AT BEDTIME, Disp: 90 tablet, Rfl: 1;  VITAMIN E PO, Take 1 tablet by mouth daily. , Disp: , Rfl: ;  zolpidem (AMBIEN) 10 MG tablet, Take 1 tablet (10 mg total) by mouth at bedtime as needed. For sleep, Disp: 90 tablet, Rfl: 1 hydrocortisone (ANUSOL-HC) 25 MG suppository, Place 1 suppository (25 mg total) rectally 2 (two) times daily., Disp: 30 suppository, Rfl: 3;  nitroGLYCERIN (NITROSTAT) 0.4 MG SL tablet, Place 1 tablet (0.4 mg total) under the tongue every 5 (five) minutes as needed for chest pain., Disp: 25 tablet, Rfl: 3  BP 174/73  Resp 18  Ht 5' 6.5" (1.689 m)  Wt 127 lb 4.8 oz (57.743 kg)  BMI 20.24 kg/m2  Body mass index is 20.24 kg/(m^2).       Physical exam well-developed well-nourished white  female appearing younger than stated age Her carotid incision on the left is well-healed and she has no bruits bilaterally Neurologically she is grossly intact 2+ radial pulses bilaterally  Carotid duplex today in our office revealed widely patent endarterectomy with no evidence of recurrent stenosis on the left. Her right carotid showed no evidence of significant stenosis  Impression and plan: Stable status post left carotid endarterectomy in 2012. We'll continue her usual activities without limitations. We will see her again in one year with a carotid duplex. She will notify should she develop any neurologic deficits

## 2013-06-30 ENCOUNTER — Encounter: Payer: Self-pay | Admitting: Family Medicine

## 2013-06-30 ENCOUNTER — Ambulatory Visit (INDEPENDENT_AMBULATORY_CARE_PROVIDER_SITE_OTHER): Payer: Medicare Other | Admitting: Family Medicine

## 2013-06-30 VITALS — BP 134/70 | HR 76 | Wt 129.0 lb

## 2013-06-30 DIAGNOSIS — F5104 Psychophysiologic insomnia: Secondary | ICD-10-CM | POA: Insufficient documentation

## 2013-06-30 DIAGNOSIS — I10 Essential (primary) hypertension: Secondary | ICD-10-CM

## 2013-06-30 DIAGNOSIS — E785 Hyperlipidemia, unspecified: Secondary | ICD-10-CM

## 2013-06-30 DIAGNOSIS — E119 Type 2 diabetes mellitus without complications: Secondary | ICD-10-CM

## 2013-06-30 DIAGNOSIS — G4700 Insomnia, unspecified: Secondary | ICD-10-CM

## 2013-06-30 LAB — LIPID PANEL
Cholesterol: 123 mg/dL (ref 0–200)
HDL: 45.9 mg/dL (ref >39.00)
LDL CALC: 41 mg/dL (ref 0–99)
Total CHOL/HDL Ratio: 3
Triglycerides: 179 mg/dL — ABNORMAL HIGH (ref 0.0–149.0)
VLDL: 35.8 mg/dL (ref 0.0–40.0)

## 2013-06-30 LAB — HEMOGLOBIN A1C: HEMOGLOBIN A1C: 6.5 % (ref 4.6–6.5)

## 2013-06-30 MED ORDER — CLOPIDOGREL BISULFATE 75 MG PO TABS: ORAL_TABLET | ORAL | Status: DC

## 2013-06-30 MED ORDER — ZOLPIDEM TARTRATE 10 MG PO TABS: 10.0000 mg | ORAL_TABLET | Freq: Every evening | ORAL | Status: DC | PRN

## 2013-06-30 MED ORDER — PANTOPRAZOLE SODIUM 40 MG PO TBEC: DELAYED_RELEASE_TABLET | ORAL | Status: DC

## 2013-06-30 MED ORDER — SIMVASTATIN 40 MG PO TABS: ORAL_TABLET | ORAL | Status: DC

## 2013-06-30 NOTE — Progress Notes (Signed)
Subjective:    Patient ID: [name], female    DOB: 04-18-1920, 80 y.o.   MRN: [id number]  HPI Medical followup.  Doing remarkably well at age 81. She has chronic problems which include type 2 diabetes, hyperlipidemia, hypertension, CAD, peripheral vascular disease, history of TIA, GERD, osteoarthritis. No recent falls. Recent evaluation per vein and vascular surgery. Blood pressure there elevated 174/73. Doppler scans were unremarkable with hx of previous carotid endarterectomy. No recent TIA-type symptoms.  She was seen recently by cardiology. Blood pressure there 140/50. Comprehensive metabolic panel unremarkable. She remains on simvastatin for hyperlipidemia. No recent lipid panel. Blood sugars been controlled by diet. She's never taken diabetes medication. A1c is consistently less than 7%.  Chronic insomnia. Requesting refills Ambien. She's tried several times to come off without success. No recent falls.  Past Medical History  Diagnosis Date  . COLONIC POLYPS, ADENOMATOUS 12/26/2009  . DIABETES MELLITUS, TYPE II 08/11/2006  . HYPERLIPIDEMIA 09/09/2006  . ANEMIA, NORMOCYTIC 11/30/2009  . HYPERTENSION 09/09/2006  . CAD, NATIVE VESSEL 04/27/2008    Cardiac catheterization 6/08: EF 55%, 1+ MR, Dx 50%, LAD 50%, dLAD 40%, OM1 50-60% (up to 70%), pOM 30%, pRCA 40%, mRCA 50-60%, pPDA 50-60% (up to 70%).  Medical therapy was recommended  . LEFT BUNDLE BRANCH BLOCK 08/11/2006  . Atrial flutter 04/12/2009  . TIA 09/02/2006    Echocardiogram 6/12: Mild to moderate MR, trace AI, trace TR, PASP 34, bubble study negative for intracardiac shunting, normal EF (greater than 55%)  . HEMORRHOIDS-INTERNAL 12/26/2009  . GERD 02/16/2007  . DIVERTICULOSIS, COLON 08/11/2006  . OSTEOARTHRITIS 08/11/2006  . OSTEOPOROSIS 08/11/2006  . Carotid stenosis     dopplers 2/83: LICA 15-17%  . Hemorrhoids    Past Surgical History  Procedure Laterality Date  . Tonsillectomy    . Eye surgery      catarac  . Closed  reduction metatarsal fracture      right 5th  . Colonoscopy w/ polypectomy  2001  . Carotid endarterectomy Left 12-05-2010    reports that she has never smoked. She has never used smokeless tobacco. She reports that she drinks alcohol. She reports that she does not use illicit drugs. family history includes Breast cancer in her sister; Colon cancer in her brother; Congestive Heart Failure (age of onset: 10) in her father; Heart attack in her mother; Heart disease in her brother; Heart disease (age of onset: 32) in her mother; Kidney disease in her mother; Uterine cancer in her sister. Allergies  Allergen Reactions  . Doxycycline Hyclate     REACTION: upset stomach  . Nitrofurantoin     REACTION: unspecified  . Sulfamethoxazole-Trimethoprim     REACTION: unspecified  . Sulfonamide Derivatives     REACTION: rash      Review of Systems  Constitutional: Negative for fatigue.  Eyes: Negative for visual disturbance.  Respiratory: Negative for cough, chest tightness, shortness of breath and wheezing.   Cardiovascular: Negative for chest pain, palpitations and leg swelling.  Neurological: Negative for dizziness, seizures, syncope, weakness, light-headedness and headaches.       Objective:   Physical Exam  Constitutional: She is oriented to person, place, and time. She appears well-developed and well-nourished.  Neck: Neck supple. No thyromegaly present.  Cardiovascular: Normal rate and regular rhythm.  Exam reveals no gallop.   Pulmonary/Chest: Effort normal and breath sounds normal. No respiratory distress. She has no wheezes. She has no rales.  Musculoskeletal: She exhibits no edema.  Neurological: She  is alert and oriented to person, place, and time. No cranial nerve deficit.          Assessment & Plan:  #1 hypertension. Stable. Continue current medications #2 hyperlipidemia. Check lipid panel. Recent hepatic function normal. #3 chronic insomnia. Sleep hygiene discussed.  Refill Ambien. Cautions about fall risk #4 type 2 diabetes. Diet controlled. Recheck A1c

## 2013-06-30 NOTE — Progress Notes (Signed)
Pre visit review using our clinic review tool, if applicable. No additional management support is needed unless otherwise documented below in the visit note. 

## 2013-07-29 ENCOUNTER — Telehealth: Payer: Self-pay | Admitting: Family Medicine

## 2013-07-29 NOTE — Telephone Encounter (Signed)
Noted  

## 2013-07-29 NOTE — Telephone Encounter (Signed)
Pt will callback tomorrow if needed

## 2013-07-29 NOTE — Telephone Encounter (Signed)
Dr. Buckner Malta will see the patient tomorrow.  Please schedule.  Thanks!

## 2013-07-29 NOTE — Telephone Encounter (Signed)
Pt decline to see another provider today for ear pain. Pt req to see dr Elease Hashimoto at end of today. Can I sch?

## 2013-07-30 ENCOUNTER — Telehealth: Payer: Self-pay | Admitting: Family Medicine

## 2013-07-30 ENCOUNTER — Ambulatory Visit (INDEPENDENT_AMBULATORY_CARE_PROVIDER_SITE_OTHER): Payer: Medicare Other | Admitting: Family Medicine

## 2013-07-30 ENCOUNTER — Encounter: Payer: Self-pay | Admitting: Family Medicine

## 2013-07-30 VITALS — BP 132/68 | HR 72 | Temp 98.1°F | Wt 129.0 lb

## 2013-07-30 DIAGNOSIS — R51 Headache: Secondary | ICD-10-CM

## 2013-07-30 DIAGNOSIS — R519 Headache, unspecified: Secondary | ICD-10-CM

## 2013-07-30 MED ORDER — GABAPENTIN 100 MG PO CAPS: 100.0000 mg | ORAL_CAPSULE | Freq: Two times a day (BID) | ORAL | Status: DC

## 2013-07-30 NOTE — Patient Instructions (Signed)
Follow up for any rash or worsening pain, otherwise in 4 days to reassess.

## 2013-07-30 NOTE — Progress Notes (Signed)
Subjective:    Patient ID: [name], female    DOB: 1920-06-20, 80 y.o.   MRN: [id number]  Otalgia  Pertinent negatives include no coughing, ear discharge, headaches, hearing loss or rash.   Patient complains of sharp shooting intermittent right ear pain. Onset Tuesday of this week. No loss of hearing. No drainage. No visible rash. No facial weakness. She has occasional pains radiating toward the right scalp region as well. Location is prominently anterior to right ear. No clear triggers. No alleviating factors. She's tried Tylenol without relief. This is a very sharp and fairly intense pain and very fleeting. No history of similar pain previously. Denies headaches. No visual changes.  Past Medical History  Diagnosis Date  . COLONIC POLYPS, ADENOMATOUS 12/26/2009  . DIABETES MELLITUS, TYPE II 08/11/2006  . HYPERLIPIDEMIA 09/09/2006  . ANEMIA, NORMOCYTIC 11/30/2009  . HYPERTENSION 09/09/2006  . CAD, NATIVE VESSEL 04/27/2008    Cardiac catheterization 6/08: EF 55%, 1+ MR, Dx 50%, LAD 50%, dLAD 40%, OM1 50-60% (up to 70%), pOM 30%, pRCA 40%, mRCA 50-60%, pPDA 50-60% (up to 70%).  Medical therapy was recommended  . LEFT BUNDLE BRANCH BLOCK 08/11/2006  . Atrial flutter 04/12/2009  . TIA 09/02/2006    Echocardiogram 6/12: Mild to moderate MR, trace AI, trace TR, PASP 34, bubble study negative for intracardiac shunting, normal EF (greater than 55%)  . HEMORRHOIDS-INTERNAL 12/26/2009  . GERD 02/16/2007  . DIVERTICULOSIS, COLON 08/11/2006  . OSTEOARTHRITIS 08/11/2006  . OSTEOPOROSIS 08/11/2006  . Carotid stenosis     dopplers 7/82: LICA 95-62%  . Hemorrhoids    Past Surgical History  Procedure Laterality Date  . Tonsillectomy    . Eye surgery      catarac  . Closed reduction metatarsal fracture      right 5th  . Colonoscopy w/ polypectomy  2001  . Carotid endarterectomy Left 12-05-2010    reports that she has never smoked. She has never used smokeless tobacco. She reports that she drinks  alcohol. She reports that she does not use illicit drugs. family history includes Breast cancer in her sister; Colon cancer in her brother; Congestive Heart Failure (age of onset: 49) in her father; Heart attack in her mother; Heart disease in her brother; Heart disease (age of onset: 63) in her mother; Kidney disease in her mother; Uterine cancer in her sister. Allergies  Allergen Reactions  . Doxycycline Hyclate     REACTION: upset stomach  . Nitrofurantoin     REACTION: unspecified  . Sulfamethoxazole-Trimethoprim     REACTION: unspecified  . Sulfonamide Derivatives     REACTION: rash      Review of Systems  Constitutional: Negative for fever and chills.  HENT: Positive for ear pain. Negative for congestion, ear discharge, hearing loss and sinus pressure.   Eyes: Negative for visual disturbance.  Respiratory: Negative for cough and shortness of breath.   Cardiovascular: Negative for chest pain.  Skin: Negative for rash.  Neurological: Negative for headaches.       Objective:   Physical Exam  Constitutional: She is oriented to person, place, and time. She appears well-developed and well-nourished.  HENT:  Right Ear: External ear normal.  Left Ear: External ear normal.  Mouth/Throat: Oropharynx is clear and moist.  Neck: Neck supple. No thyromegaly present.  Cardiovascular: Normal rate and regular rhythm.   Pulmonary/Chest: Effort normal and breath sounds normal.  Lymphadenopathy:    She has no cervical adenopathy.  Neurological: She is alert and oriented  to person, place, and time. No cranial nerve deficit.  Skin: No rash noted.          Assessment & Plan:  Transient right facial pain. Symptoms described suggest neuropathic type pain. She does not have any rash currently to suggest shingles but we've asked that she watch closely for rash. She does not have a typical triggers to suggest trigeminal neuralgia. Her pain has been fairly intense. We've recommended trial of  gabapentin 100 mg twice a day and reassess in 4 days.

## 2013-07-30 NOTE — Progress Notes (Signed)
Pre-visit discussion using our clinic review tool. No additional management support is needed unless otherwise documented below in the visit note.  

## 2013-07-30 NOTE — Telephone Encounter (Signed)
Pt wanted Dr Elease Hashimoto to know that she has had the shingles shot

## 2013-08-02 NOTE — Telephone Encounter (Signed)
Pt received shot in 2008

## 2013-08-03 ENCOUNTER — Encounter: Payer: Self-pay | Admitting: Family Medicine

## 2013-08-03 ENCOUNTER — Ambulatory Visit (INDEPENDENT_AMBULATORY_CARE_PROVIDER_SITE_OTHER): Payer: Medicare Other | Admitting: Family Medicine

## 2013-08-03 VITALS — BP 130/70 | HR 68 | Temp 97.5°F | Wt 129.0 lb

## 2013-08-03 DIAGNOSIS — R519 Headache, unspecified: Secondary | ICD-10-CM

## 2013-08-03 DIAGNOSIS — R51 Headache: Secondary | ICD-10-CM

## 2013-08-03 NOTE — Progress Notes (Signed)
Subjective:    Patient ID: [name], female    DOB: May 19, 1920, 80 y.o.   MRN: [id number]  HPI Followup regarding right facial pain. Refer to prior note  "Patient complains of sharp shooting intermittent right ear pain. Onset Tuesday of this week. No loss of hearing. No drainage. No visible rash. No facial weakness. She has occasional pains radiating toward the right scalp region as well. Location is prominently anterior to right ear. No clear triggers. No alleviating factors. She's tried Tylenol without relief. This is a very sharp and fairly intense pain and very fleeting. No history of similar pain previously. Denies headaches. No visual changes"    She described a sharp pain as above without clear trigger factors and absence of any rash. We started low-dose gabapentin 100 mg twice a day and she noticed great improvement. She's having decreased frequency and also greatly reduced severity of pain since then. No facial weakness. No headaches. No visual changes. No skin rash.  Past Medical History  Diagnosis Date  . COLONIC POLYPS, ADENOMATOUS 12/26/2009  . DIABETES MELLITUS, TYPE II 08/11/2006  . HYPERLIPIDEMIA 09/09/2006  . ANEMIA, NORMOCYTIC 11/30/2009  . HYPERTENSION 09/09/2006  . CAD, NATIVE VESSEL 04/27/2008    Cardiac catheterization 6/08: EF 55%, 1+ MR, Dx 50%, LAD 50%, dLAD 40%, OM1 50-60% (up to 70%), pOM 30%, pRCA 40%, mRCA 50-60%, pPDA 50-60% (up to 70%).  Medical therapy was recommended  . LEFT BUNDLE BRANCH BLOCK 08/11/2006  . Atrial flutter 04/12/2009  . TIA 09/02/2006    Echocardiogram 6/12: Mild to moderate MR, trace AI, trace TR, PASP 34, bubble study negative for intracardiac shunting, normal EF (greater than 55%)  . HEMORRHOIDS-INTERNAL 12/26/2009  . GERD 02/16/2007  . DIVERTICULOSIS, COLON 08/11/2006  . OSTEOARTHRITIS 08/11/2006  . OSTEOPOROSIS 08/11/2006  . Carotid stenosis     dopplers 9/47: LICA 09-62%  . Hemorrhoids    Past Surgical History  Procedure  Laterality Date  . Tonsillectomy    . Eye surgery      catarac  . Closed reduction metatarsal fracture      right 5th  . Colonoscopy w/ polypectomy  2001  . Carotid endarterectomy Left 12-05-2010    reports that she has never smoked. She has never used smokeless tobacco. She reports that she drinks alcohol. She reports that she does not use illicit drugs. family history includes Breast cancer in her sister; Colon cancer in her brother; Congestive Heart Failure (age of onset: 42) in her father; Heart attack in her mother; Heart disease in her brother; Heart disease (age of onset: 72) in her mother; Kidney disease in her mother; Uterine cancer in her sister. Allergies  Allergen Reactions  . Doxycycline Hyclate     REACTION: upset stomach  . Nitrofurantoin     REACTION: unspecified  . Sulfamethoxazole-Trimethoprim     REACTION: unspecified  . Sulfonamide Derivatives     REACTION: rash      Review of Systems  Constitutional: Negative for fever and chills.  Eyes: Negative for visual disturbance.  Skin: Negative for rash.  Neurological: Negative for headaches.       Objective:   Physical Exam  Constitutional: She appears well-developed and well-nourished.  HENT:  Right Ear: External ear normal.  Left Ear: External ear normal.  Mouth/Throat: Oropharynx is clear and moist.  Cardiovascular: Normal rate.   Pulmonary/Chest: Effort normal and breath sounds normal.  Neurological: No cranial nerve deficit.  Skin: No rash noted.  Assessment & Plan:  Right facial pain. Greatly improved By description, this sounds more neuropathic. She's not had any rash to suggest shingles. Doubt trigeminal neuralgia. Continue low-dose gabapentin 100 mg twice a day which she is tolerating well no side effects. Reassess one month. If symptoms resolved at that time consider tapering off

## 2013-08-03 NOTE — Patient Instructions (Addendum)
Continue with gabapentin twice daily until follow up. Follow up immediately for any facial weakness or worsening pain.

## 2013-08-03 NOTE — Progress Notes (Signed)
Pre visit review using our clinic review tool, if applicable. No additional management support is needed unless otherwise documented below in the visit note. 

## 2013-08-09 ENCOUNTER — Telehealth: Payer: Self-pay | Admitting: Family Medicine

## 2013-08-09 NOTE — Telephone Encounter (Signed)
Hold gabapentin for now

## 2013-08-09 NOTE — Telephone Encounter (Signed)
Patient Information:  Caller Name: Meeya  Phone: [phone]  Patient: Desiree, [name]  Gender: Female  DOB: 09/01/1920  Age: 80 Years  PCP: Carolann Littler (Family Practice)  Office Follow Up:  Does the office need to follow up with this patient?: Yes  Instructions For The Office: Office please review and call pt regarding possible adjustment to Gabapentin due to no energy since starting this Rx  RN Note:  pt denies any thing but no energy; still able to carry out daily activities but doesn't like feeling like she wnats to lay around.  Would like to have MD possibly make an adjustment to the Gabapentin.  Symptoms  Reason For Call & Symptoms: Pt is calling and states that she was seen last week on 08/03/13 for f/u for sharp pains to the right ear; Gabapentin was started for the sx on 07/30/13; seems to have helped with the pain but she is now feeling very lethargic since starting this medication; taking her forever to get out of bed; no energy; doesn't even want to cook or eat supper last night; doesn't want to stop this med on her own;  Reviewed Health History In EMR: Yes  Reviewed Medications In EMR: Yes  Reviewed Allergies In EMR: Yes  Reviewed Surgeries / Procedures: Yes  Date of Onset of Symptoms: 08/08/2013  Guideline(s) Used:  No Protocol Available - Information Only  Disposition Per Guideline:   Discuss with PCP and Callback by Nurse Today  Reason For Disposition Reached:   Nursing judgment  Advice Given:  Call Back If:  New symptoms develop  You become worse.  Patient Will Follow Care Advice:  YES

## 2013-08-09 NOTE — Telephone Encounter (Signed)
Pt informed

## 2013-08-30 ENCOUNTER — Ambulatory Visit: Payer: Medicare Other | Admitting: Family Medicine

## 2013-08-30 ENCOUNTER — Encounter: Payer: Self-pay | Admitting: Family Medicine

## 2013-08-30 ENCOUNTER — Ambulatory Visit (INDEPENDENT_AMBULATORY_CARE_PROVIDER_SITE_OTHER): Payer: Medicare Other | Admitting: Family Medicine

## 2013-08-30 VITALS — BP 136/68 | HR 69 | Temp 97.9°F | Wt 128.0 lb

## 2013-08-30 DIAGNOSIS — T24212A Burn of second degree of left thigh, initial encounter: Secondary | ICD-10-CM

## 2013-08-30 DIAGNOSIS — T24219A Burn of second degree of unspecified thigh, initial encounter: Secondary | ICD-10-CM

## 2013-08-30 DIAGNOSIS — R42 Dizziness and giddiness: Secondary | ICD-10-CM

## 2013-08-30 DIAGNOSIS — R51 Headache: Secondary | ICD-10-CM

## 2013-08-30 DIAGNOSIS — R519 Headache, unspecified: Secondary | ICD-10-CM

## 2013-08-30 NOTE — Progress Notes (Signed)
Subjective:    Patient ID: [name], female    DOB: 1920-04-10, 80 y.o.   MRN: [id number]  HPI Here for followup several issues as follows  Recent right facial pain. We did not suspect trigeminal neuralgia but this did sound neuropathic. No rash to suggest shingles. Her pain did improve promptly with gabapentin. Her pain is essentially resolved at this time she is no longer taking gabapentin. No facial weakness. No hearing changes.  History of recurrent vertigo. Episode this morning after eating cereal. She was cooking an omelette and had onset suddenly of vertigo. She dropped a frying pan with burn left anterior thigh. Tetanus 2012. No other injuries. Vertigo resolved at this time. No headache, speech changes, or any focal weakness. No confusion.  Burn is only minimally painful. She did not apply any topical antibiotic  Past Medical History  Diagnosis Date  . COLONIC POLYPS, ADENOMATOUS 12/26/2009  . DIABETES MELLITUS, TYPE II 08/11/2006  . HYPERLIPIDEMIA 09/09/2006  . ANEMIA, NORMOCYTIC 11/30/2009  . HYPERTENSION 09/09/2006  . CAD, NATIVE VESSEL 04/27/2008    Cardiac catheterization 6/08: EF 55%, 1+ MR, Dx 50%, LAD 50%, dLAD 40%, OM1 50-60% (up to 70%), pOM 30%, pRCA 40%, mRCA 50-60%, pPDA 50-60% (up to 70%).  Medical therapy was recommended  . LEFT BUNDLE BRANCH BLOCK 08/11/2006  . Atrial flutter 04/12/2009  . TIA 09/02/2006    Echocardiogram 6/12: Mild to moderate MR, trace AI, trace TR, PASP 34, bubble study negative for intracardiac shunting, normal EF (greater than 55%)  . HEMORRHOIDS-INTERNAL 12/26/2009  . GERD 02/16/2007  . DIVERTICULOSIS, COLON 08/11/2006  . OSTEOARTHRITIS 08/11/2006  . OSTEOPOROSIS 08/11/2006  . Carotid stenosis     dopplers 7/40: LICA 81-44%  . Hemorrhoids    Past Surgical History  Procedure Laterality Date  . Tonsillectomy    . Eye surgery      catarac  . Closed reduction metatarsal fracture      right 5th  . Colonoscopy w/ polypectomy  2001  .  Carotid endarterectomy Left 12-05-2010    reports that she has never smoked. She has never used smokeless tobacco. She reports that she drinks alcohol. She reports that she does not use illicit drugs. family history includes Breast cancer in her sister; Colon cancer in her brother; Congestive Heart Failure (age of onset: 73) in her father; Heart attack in her mother; Heart disease in her brother; Heart disease (age of onset: 69) in her mother; Kidney disease in her mother; Uterine cancer in her sister. Allergies  Allergen Reactions  . Doxycycline Hyclate     REACTION: upset stomach  . Nitrofurantoin     REACTION: unspecified  . Sulfamethoxazole-Trimethoprim     REACTION: unspecified  . Sulfonamide Derivatives     REACTION: rash      Review of Systems  Constitutional: Negative for fever, chills, appetite change and unexpected weight change.  Respiratory: Negative for cough and shortness of breath.   Skin: Negative for rash.  Neurological: Positive for dizziness. Negative for tremors, seizures, syncope, facial asymmetry, speech difficulty, weakness and headaches.  Psychiatric/Behavioral: Negative for confusion.       Objective:   Physical Exam  Constitutional: She is oriented to person, place, and time. She appears well-developed and well-nourished.  HENT:  Right Ear: External ear normal.  Left Ear: External ear normal.  Mouth/Throat: Oropharynx is clear and moist.  Neck: Neck supple. No thyromegaly present.  Cardiovascular: Normal rate and regular rhythm.   Murmur heard. Pulmonary/Chest: Effort normal  and breath sounds normal. No respiratory distress. She has no wheezes. She has no rales.  Neurological: She is alert and oriented to person, place, and time. No cranial nerve deficit. Coordination normal.  No focal strength deficits. Ambulating without difficulty.  Skin:  Left anterior upper thigh reveals area of erythema about 4 x 5 cm. She has couple of vesicles from second  degree burn about 2 cm in length  Psychiatric: She has a normal mood and affect. Her behavior is normal.          Assessment & Plan:  #1 recent right facial pain resolved. Leave off gabapentin this time #2 second degree burn left anterior thigh. Burn is cleaned with saline and Silvadene applied. Daily Silvadene and wound care instruction given for the next week. Followup promptly for signs of secondary infection. Tetanus is up-to-date #3 recurrent vertigo. Transient episode this morning currently resolved. Safety precautions discussed. She has had vestibular rehabilitation in the past.

## 2013-08-30 NOTE — Progress Notes (Signed)
Pre visit review using our clinic review tool, if applicable. No additional management support is needed unless otherwise documented below in the visit note. 

## 2013-08-30 NOTE — Patient Instructions (Signed)
Burn Care Your skin is a natural barrier to infection. It is the largest organ of your body. Burns damage this natural protection. To help prevent infection, it is very important to follow your caregiver's instructions in the care of your burn. Burns are classified as:  First degree. There is only redness of the skin (erythema). No scarring is expected.  Second degree. There is blistering of the skin. Scarring may occur with deeper burns.  Third degree. All layers of the skin are injured, and scarring is expected. HOME CARE INSTRUCTIONS   Wash your hands well before changing your bandage.  Change your bandage as often as directed by your caregiver.  Remove the old bandage. If the bandage sticks, you may soak it off with cool, clean water.  Cleanse the burn thoroughly but gently with mild soap and water.  Pat the area dry with a clean, dry cloth.  Apply a thin layer of antibacterial cream to the burn.  Apply a clean bandage as instructed by your caregiver.  Keep the bandage as clean and dry as possible.  Elevate the affected area for the first 24 hours, then as instructed by your caregiver.  Only take over-the-counter or prescription medicines for pain, discomfort, or fever as directed by your caregiver. SEEK IMMEDIATE MEDICAL CARE IF:   You develop excessive pain.  You develop redness, tenderness, swelling, or red streaks near the burn.  The burned area develops yellowish-white fluid (pus) or a bad smell.  You have a fever. MAKE SURE YOU:   Understand these instructions.  Will watch your condition.  Will get help right away if you are not doing well or get worse. Document Released: 01/28/2005 Document Revised: 04/22/2011 Document Reviewed: 06/20/2010 [employer] Patient Information 2015 [employer]. This information is not intended to replace advice given to you by your health care provider. Make sure you discuss any questions you have with your health care  provider.  

## 2013-10-04 ENCOUNTER — Ambulatory Visit (INDEPENDENT_AMBULATORY_CARE_PROVIDER_SITE_OTHER): Payer: Medicare Other | Admitting: Cardiology

## 2013-10-04 ENCOUNTER — Encounter: Payer: Self-pay | Admitting: Cardiology

## 2013-10-04 VITALS — BP 152/72 | HR 77 | Ht 66.6 in | Wt 130.0 lb

## 2013-10-04 DIAGNOSIS — R55 Syncope and collapse: Secondary | ICD-10-CM

## 2013-10-04 DIAGNOSIS — I4892 Unspecified atrial flutter: Secondary | ICD-10-CM

## 2013-10-04 MED ORDER — METOPROLOL TARTRATE 25 MG PO TABS: 25.0000 mg | ORAL_TABLET | Freq: Two times a day (BID) | ORAL | Status: DC
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
## 2013-10-04 NOTE — Patient Instructions (Addendum)
Your physician has recommended you make the following change in your medication:   1. Increase metoprolol tartrate to 25 mg 1 tablet twice a day.  Your physician recommends that you schedule a follow-up appointment in: 2 months with Dr. Meda Coffee.  You have been referred to [facility] to re-establish for Syncope.

## 2013-10-06 ENCOUNTER — Telehealth: Payer: Self-pay | Admitting: Cardiology

## 2013-10-06 NOTE — Telephone Encounter (Signed)
LMTCB about Dr Francesca Oman recommendations

## 2013-10-06 NOTE — Telephone Encounter (Signed)
New message     Patient calling seen Dr. Meda Coffee on yesterday blood pressure medication was adjusted .   C/O not feeling well. Think her blood pressure has drop.

## 2013-10-06 NOTE — Telephone Encounter (Signed)
lmtcb

## 2013-10-06 NOTE — Telephone Encounter (Signed)
Ivy, tell her to go back to 12.5 mg po BID.

## 2013-10-06 NOTE — Telephone Encounter (Signed)
Pt calling to inform Dr Meda Coffee that her BP has dropped significantly.  Pt states that she was seen in the office on 8/24, and Dr Meda Coffee increased her Metoprolol to 25 mg BID.  Pt states since the increase in her BP med, her BP has dropped significantly and she feels "funny." Pt reports her current BP as of this morning- 108/64 HR-76.  Pt denies any cp, sob, palpitations, or syncopal episodes at this time.  Pt concerned that it dropped too much from 152/72 on 8/24 to 108/64 today.  Informed pt that her BP is WNL.  Informed pt to eat a good breakfast, drink plenty of fluids, and hold her morning dose of metoprolol and resume the 2nd dose in the evening.  Advised pt to report her BP to our office in the morning.  Advised pt that if she starts feeling dizzy, having syncopal episodes, cp, sob, palpitations, or becomes acutely distressed, then she should seek immediate medial attention and inform our office.  Informed pt that I will send this message to Dr Meda Coffee for further review and recommendation and follow-up thereafter.  Pt verbalized understanding and agrees with this plan.

## 2013-10-07 MED ORDER — METOPROLOL TARTRATE 12.5 MG HALF TABLET: 12.5000 mg | ORAL_TABLET | Freq: Two times a day (BID) | ORAL | Status: DC

## 2013-10-07 NOTE — Telephone Encounter (Signed)
Contacted pt to endorse Dr Francesca Oman recommendation for the pt to take 12.5 mg metoprolol BID and continue monitoring her pressures.  Pt verbalized understanding and agrees with this plan.  Pt states she feels much better today.  Pt states her current BP is 120/64 today.

## 2013-10-11 ENCOUNTER — Telehealth: Payer: Self-pay | Admitting: Neurology

## 2013-10-11 NOTE — Telephone Encounter (Signed)
Patient calling to request sooner appointment than the one currently scheduled in September, states that she feels like her "temples are being drawn in" and that there is a lot of pressure. Please return call to patient and advise.

## 2013-10-11 NOTE — Telephone Encounter (Signed)
Patient states the head pressure has  Been going on for several days and especially when she wakes up in the mornings and would like to come in sooner than the end of the month.  Please advise.

## 2013-10-12 NOTE — Telephone Encounter (Signed)
The patient's appointment was moved up.

## 2013-10-13 ENCOUNTER — Encounter: Payer: Self-pay | Admitting: Neurology

## 2013-10-13 ENCOUNTER — Ambulatory Visit (INDEPENDENT_AMBULATORY_CARE_PROVIDER_SITE_OTHER): Payer: Medicare Other | Admitting: Neurology

## 2013-10-13 VITALS — BP 144/64 | HR 64 | Resp 16 | Ht 67.0 in | Wt 130.0 lb

## 2013-10-13 DIAGNOSIS — H8110 Benign paroxysmal vertigo, unspecified ear: Secondary | ICD-10-CM

## 2013-10-13 DIAGNOSIS — H8112 Benign paroxysmal vertigo, left ear: Secondary | ICD-10-CM

## 2013-10-13 DIAGNOSIS — R55 Syncope and collapse: Secondary | ICD-10-CM
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
## 2013-10-13 NOTE — Patient Instructions (Signed)
Patient lives near [place] location  needs Pt with vestibular rehab and wants to avoid driving on [address].

## 2013-10-14 NOTE — Progress Notes (Signed)
I agree with the above plan 

## 2013-10-15 ENCOUNTER — Encounter: Payer: Self-pay | Admitting: Internal Medicine

## 2013-10-15 DIAGNOSIS — R55 Syncope and collapse: Secondary | ICD-10-CM | POA: Insufficient documentation

## 2013-10-17 ENCOUNTER — Other Ambulatory Visit: Payer: Self-pay | Admitting: Family Medicine

## 2013-10-19 ENCOUNTER — Telehealth: Payer: Self-pay | Admitting: Neurology

## 2013-10-19 NOTE — Telephone Encounter (Signed)
Patient saw Dr. Brett Fairy on October 13, 2013 for fainting and wants to know why she is having a EEG and Ct scan.  Please advise.

## 2013-10-19 NOTE — Telephone Encounter (Signed)
Patient was notified.

## 2013-10-19 NOTE — Telephone Encounter (Signed)
The differential for loss of awareness is a seizure or a small stroke. That's the test battery for this symptom.  I hope this helps. CD

## 2013-10-19 NOTE — Telephone Encounter (Signed)
Patient is questioning why she's having an CT scan and EEG?  Please call anytime and leave detailed message on voicemail.

## 2013-10-20 ENCOUNTER — Ambulatory Visit (INDEPENDENT_AMBULATORY_CARE_PROVIDER_SITE_OTHER): Payer: Medicare Other | Admitting: Radiology

## 2013-10-20 DIAGNOSIS — R55 Syncope and collapse: Secondary | ICD-10-CM

## 2013-10-20 DIAGNOSIS — R404 Transient alteration of awareness: Secondary | ICD-10-CM

## 2013-10-22 ENCOUNTER — Other Ambulatory Visit: Payer: Self-pay

## 2013-10-22 ENCOUNTER — Encounter: Payer: Self-pay | Admitting: Family Medicine

## 2013-10-22 ENCOUNTER — Ambulatory Visit (INDEPENDENT_AMBULATORY_CARE_PROVIDER_SITE_OTHER): Payer: Medicare Other | Admitting: Family Medicine

## 2013-10-22 ENCOUNTER — Telehealth: Payer: Self-pay | Admitting: Neurology

## 2013-10-22 VITALS — BP 130/76 | HR 65 | Temp 97.8°F | Wt 130.0 lb

## 2013-10-22 DIAGNOSIS — E119 Type 2 diabetes mellitus without complications: Secondary | ICD-10-CM

## 2013-10-22 DIAGNOSIS — G4700 Insomnia, unspecified: Secondary | ICD-10-CM

## 2013-10-22 DIAGNOSIS — M7918 Myalgia, other site: Secondary | ICD-10-CM

## 2013-10-22 DIAGNOSIS — R55 Syncope and collapse: Secondary | ICD-10-CM

## 2013-10-22 DIAGNOSIS — IMO0001 Reserved for inherently not codable concepts without codable children: Secondary | ICD-10-CM

## 2013-10-22 DIAGNOSIS — F5104 Psychophysiologic insomnia: Secondary | ICD-10-CM

## 2013-10-22 LAB — HEMOGLOBIN A1C: Hgb A1c MFr Bld: 6.8 % — ABNORMAL HIGH (ref 4.6–6.5)

## 2013-10-22 MED ORDER — CLOPIDOGREL BISULFATE 75 MG PO TABS: ORAL_TABLET | ORAL | Status: DC

## 2013-10-22 MED ORDER — GABAPENTIN 100 MG PO CAPS: 100.0000 mg | ORAL_CAPSULE | ORAL | Status: DC | PRN

## 2013-10-22 MED ORDER — PANTOPRAZOLE SODIUM 40 MG PO TBEC: DELAYED_RELEASE_TABLET | ORAL | Status: DC

## 2013-10-22 MED ORDER — SIMVASTATIN 40 MG PO TABS: ORAL_TABLET | ORAL | Status: DC

## 2013-10-22 NOTE — Progress Notes (Signed)
Subjective:    Patient ID: [name], female    DOB: 04-Oct-1920, 80 y.o.   MRN: [id number]  Hip Pain    Patient has chronic problems including atrial flutter, CAD, peripheral vascular disease, type 2 diabetes, hyperlipidemia, hypertension, osteoarthritis, osteoporosis, and intermittent vertigo. She is seen today for the following:  Type 2 diabetes. History of good control. Blood sugars been stable. No symptoms of polyuria or polydipsia. Appetite stable  Transient right buttock pain. This occurred last week. Denies any low back pain. Her symptoms actually got better after doing more physical activity with yard work. Denies any classic radiculopathy symptoms. No numbness or weakness. She states her pain is essentially resolved at this time. No pain with ambulation  Recent probable syncopal episode. She had mentioned vertigo and fall but had never mentioned any evidence for syncope last visit. She has seen both cardiologist and neurologist. EEG has been done and CT of head pending. She's had no dizziness whatsoever since then. No history of seizure. No recent falls since last visit  Chronic insomnia. She has taken low-dose Ambien in the past. Sleep hygiene has been discussed  Past Medical History  Diagnosis Date  . COLONIC POLYPS, ADENOMATOUS 12/26/2009  . DIABETES MELLITUS, TYPE II 08/11/2006  . HYPERLIPIDEMIA 09/09/2006  . ANEMIA, NORMOCYTIC 11/30/2009  . HYPERTENSION 09/09/2006  . CAD, NATIVE VESSEL 04/27/2008    Cardiac catheterization 6/08: EF 55%, 1+ MR, Dx 50%, LAD 50%, dLAD 40%, OM1 50-60% (up to 70%), pOM 30%, pRCA 40%, mRCA 50-60%, pPDA 50-60% (up to 70%).  Medical therapy was recommended  . LEFT BUNDLE BRANCH BLOCK 08/11/2006  . Atrial flutter 04/12/2009  . TIA 09/02/2006    Echocardiogram 6/12: Mild to moderate MR, trace AI, trace TR, PASP 34, bubble study negative for intracardiac shunting, normal EF (greater than 55%)  . HEMORRHOIDS-INTERNAL 12/26/2009  . GERD 02/16/2007  .  DIVERTICULOSIS, COLON 08/11/2006  . OSTEOARTHRITIS 08/11/2006  . OSTEOPOROSIS 08/11/2006  . Carotid stenosis     dopplers 2/42: LICA 35-36%  . Hemorrhoids    Past Surgical History  Procedure Laterality Date  . Tonsillectomy    . Eye surgery      catarac  . Closed reduction metatarsal fracture      right 5th  . Colonoscopy w/ polypectomy  2001  . Carotid endarterectomy Left 12-05-2010    reports that she has never smoked. She has never used smokeless tobacco. She reports that she drinks alcohol. She reports that she does not use illicit drugs. family history includes Breast cancer in her sister; Colon cancer in her brother; Congestive Heart Failure (age of onset: 105) in her father; Heart attack in her mother; Heart disease in her brother; Heart disease (age of onset: 18) in her mother; Kidney disease in her mother; Uterine cancer in her sister. Allergies  Allergen Reactions  . Doxycycline Hyclate     REACTION: upset stomach  . Nitrofurantoin     REACTION: unspecified  . Sulfamethoxazole-Trimethoprim     REACTION: unspecified  . Sulfonamide Derivatives     REACTION: rash      Review of Systems  Constitutional: Negative for fatigue and unexpected weight change.  Eyes: Negative for visual disturbance.  Respiratory: Negative for cough, chest tightness, shortness of breath and wheezing.   Cardiovascular: Negative for chest pain, palpitations and leg swelling.  Gastrointestinal: Negative for abdominal pain.  Endocrine: Negative for polydipsia and polyuria.  Genitourinary: Negative for dysuria.  Neurological: Negative for dizziness, seizures, syncope, weakness, light-headedness  and headaches.       Objective:   Physical Exam  Constitutional: She is oriented to person, place, and time. She appears well-developed and well-nourished.  Neck: Neck supple. No thyromegaly present.  Cardiovascular: Normal rate and regular rhythm.  Exam reveals no gallop.   Pulmonary/Chest: Effort  normal and breath sounds normal. No respiratory distress. She has no wheezes. She has no rales.  Musculoskeletal: She exhibits no edema.  Right hip full range of motion. No lateral hip tenderness. Straight leg raise negative.  Neurological: She is alert and oriented to person, place, and time.  Full-strength lower extremities.          Assessment & Plan:  #1 type 2 diabetes. History of good control. Recheck A1c #2 recent right hip/buttock pain. ?strain vs nerve impingement.  Nonfocal exam. She has excellent range of motion right hip. Straight leg raise is negative. Observe for now #3 probable syncopal episode recently. Neurologic workup in process. She's not describing any recent orthostatic type symptoms. EEG pending

## 2013-10-22 NOTE — Telephone Encounter (Signed)
Patient called and asked how to get the gel out of her hair from the EEG.  I spoke to Collie Siad and relayed to patient that the substance is water soluble and just needs a shampoo.  She has tried that and will repeat shampoos.

## 2013-10-22 NOTE — Progress Notes (Signed)
Pre visit review using our clinic review tool, if applicable. No additional management support is needed unless otherwise documented below in the visit note. 

## 2013-10-25 ENCOUNTER — Other Ambulatory Visit (INDEPENDENT_AMBULATORY_CARE_PROVIDER_SITE_OTHER): Payer: Self-pay | Admitting: General Surgery

## 2013-10-25 ENCOUNTER — Ambulatory Visit
Admission: RE | Admit: 2013-10-25 | Discharge: 2013-10-25 | Disposition: A | Discharge status: Home / Self Care | Payer: Medicare Other | Source: Ambulatory Visit | Attending: Neurology | Admitting: Neurology

## 2013-10-25 ENCOUNTER — Ambulatory Visit (INDEPENDENT_AMBULATORY_CARE_PROVIDER_SITE_OTHER): Payer: Medicare Other | Admitting: General Surgery

## 2013-10-25 DIAGNOSIS — K644 Residual hemorrhoidal skin tags: Secondary | ICD-10-CM

## 2013-10-25 DIAGNOSIS — R55 Syncope and collapse: Secondary | ICD-10-CM

## 2013-10-25 DIAGNOSIS — K648 Other hemorrhoids: Principal | ICD-10-CM

## 2013-10-25 MED ORDER — HYDROCORTISONE 2.5 % RE CREA: 1.0000 "application " | TOPICAL_CREAM | Freq: Two times a day (BID) | RECTAL | Status: DC

## 2013-10-26 ENCOUNTER — Telehealth: Payer: Self-pay

## 2013-10-26 NOTE — Telephone Encounter (Signed)
Opened by mistake.

## 2013-10-28 ENCOUNTER — Telehealth: Payer: Self-pay | Admitting: Family Medicine

## 2013-10-28 MED ORDER — PANTOPRAZOLE SODIUM 20 MG PO TBEC: 20.0000 mg | DELAYED_RELEASE_TABLET | ORAL | Status: DC

## 2013-10-28 NOTE — Telephone Encounter (Signed)
ok 

## 2013-10-28 NOTE — Telephone Encounter (Signed)
RX changed and sent to mail order.

## 2013-10-28 NOTE — Telephone Encounter (Signed)
Ref #  [id number] Pt called [facility] b/c she is having trouble splitting the pantoprazole (PROTONIX) 40 MG tablet in half. Manufacturer does not reccommend splitting that anyway. They are requesting a new rx for 20 mg/  90 day

## 2013-10-28 NOTE — Telephone Encounter (Signed)
Is it okay to change?

## 2013-11-04 DIAGNOSIS — R55 Syncope and collapse: Secondary | ICD-10-CM | POA: Insufficient documentation

## 2013-11-04 DIAGNOSIS — R404 Transient alteration of awareness: Secondary | ICD-10-CM | POA: Insufficient documentation

## 2013-11-04 NOTE — Procedures (Signed)
[facility]  EEG (ELECTROENCEPHALOGRAM) REPORT   STUDY DATE:   10-20-13 PATIENT NAME:  [name]   MRN: [id number]   ORDERING CLINICIAN: Larey Seat, MD  Neurologist : Dr. Antony Contras - vertigo evaluation PCP:  Dr Sherren Mocha, Dellis Filbert  @[place]   TECHNOLOGIST: Hassell Done  TECHNIQUE: Electroencephalogram was recorded utilizing standard 10-20 system of lead placement and reformatted into average and bipolar montages.      RECORDING TIME: 31 minutes   ACTIVATION:  strobe lights and hyperventilation    CLINICAL INFORMATION:  Syncope with sudden loss of awareness, 80 year old female patient.      FINDINGS: EEG  Background rhythm of  9.5  hertz .  Well organized, symmetric background.  The heart rates varied from 64-66 bpm in NSR.   Photic stimulation caused entrainment  at 9,11,13 hertz without  epileptiform discharges, periodic changes. Patient recorded in the awake/ drowsy stages but not asleep . Photic stimulation was performed and  Hyperventilation was deferred.   IMPRESSION:  EEG is normal for age , this patient has a surprisingly agile background rhythm at 9.5 hertz.  The patient was called with results on 10-29-13   .  Larey Seat , MD

## 2013-11-10 ENCOUNTER — Institutional Professional Consult (permissible substitution): Payer: Medicare Other | Admitting: Neurology

## 2013-11-11 ENCOUNTER — Telehealth: Payer: Self-pay | Admitting: Neurology

## 2013-11-11 NOTE — Telephone Encounter (Signed)
Patient requesting copy of EEG be mailed to her. I will place in mail today. She does not utilize My Chart.

## 2013-11-11 NOTE — Telephone Encounter (Signed)
Patient requesting EEG results, please return call and advise.

## 2013-11-24 ENCOUNTER — Ambulatory Visit (INDEPENDENT_AMBULATORY_CARE_PROVIDER_SITE_OTHER): Payer: Medicare Other

## 2013-11-24 DIAGNOSIS — Z23 Encounter for immunization: Secondary | ICD-10-CM

## 2013-11-29 ENCOUNTER — Encounter: Payer: Self-pay | Admitting: Family

## 2013-11-29 ENCOUNTER — Ambulatory Visit (INDEPENDENT_AMBULATORY_CARE_PROVIDER_SITE_OTHER): Payer: Medicare Other | Admitting: Family

## 2013-11-29 ENCOUNTER — Telehealth: Payer: Self-pay | Admitting: Family Medicine

## 2013-11-29 VITALS — BP 122/68 | Temp 98.6°F

## 2013-11-29 DIAGNOSIS — G458 Other transient cerebral ischemic attacks and related syndromes: Secondary | ICD-10-CM

## 2013-11-29 DIAGNOSIS — H539 Unspecified visual disturbance: Secondary | ICD-10-CM

## 2013-11-29 LAB — BASIC METABOLIC PANEL
BUN: 22 mg/dL (ref 6–23)
CO2: 28 meq/L (ref 19–32)
Calcium: 9.2 mg/dL (ref 8.4–10.5)
Chloride: 99 mEq/L (ref 96–112)
Creatinine, Ser: 1.1 mg/dL (ref 0.4–1.2)
GFR: 48.92 mL/min — ABNORMAL LOW (ref >60.00)
GLUCOSE: 148 mg/dL — AB (ref 70–99)
Potassium: 4.9 mEq/L (ref 3.5–5.1)
SODIUM: 137 meq/L (ref 135–145)

## 2013-11-29 LAB — POCT URINALYSIS DIPSTICK
Bilirubin, UA: NEGATIVE
GLUCOSE UA: NEGATIVE
Ketones, UA: NEGATIVE
Leukocytes, UA: NEGATIVE
NITRITE UA: NEGATIVE
PH UA: 5.5
Protein, UA: NEGATIVE
Spec Grav, UA: <=1.005
Urobilinogen, UA: 0.2

## 2013-11-29 LAB — HEPATIC FUNCTION PANEL
ALBUMIN: 3.4 g/dL — AB (ref 3.5–5.2)
ALK PHOS: 35 U/L — AB (ref 39–117)
ALT: 19 U/L (ref 0–35)
AST: 25 U/L (ref 0–37)
Bilirubin, Direct: 0.1 mg/dL (ref 0.0–0.3)
Total Bilirubin: 0.4 mg/dL (ref 0.2–1.2)
Total Protein: 7.7 g/dL (ref 6.0–8.3)

## 2013-11-29 NOTE — Telephone Encounter (Signed)
Patient Information:  Caller Name: Ora  Phone: [phone]  Patient: [name], [name]  Gender: Female  DOB: 1920/03/20  Age: 80 Years  PCP: Carolann Littler [facility])  Office Follow Up:  Does the office need to follow up with this patient?: Yes  Instructions For The Office: Patient would like to see Dr. Elease Hashimoto. No availability. Requesting to be worked in. PLEASE REVIEW AND ADVISE.  CONTACT PATIENT.  RN Note:  Patient would like to see Dr. Elease Hashimoto. No availability. Requesting to be worked in. PLEASE REVIEW AND ADVISE.  CONTACT PATIENT.  Symptoms  Reason For Call & Symptoms: Patient states "I think I had TIA on Saturday" .  She did not call EMS but did call her family. She states she got up and was reading the newspaper the Print came out of view and fuzzy. Occured in Right eye.   Lasted "short amount time".  No weakness of either side of body, no changes in speech. She was able to walk normally.  Weak feeling. She felt tired yesterday.  Blood pressure 103/52/ Pulse 78.  Vision is normal today. Speech is clear.  Reviewed Health History In EMR: Yes  Reviewed Medications In EMR: Yes  Reviewed Allergies In EMR: Yes  Reviewed Surgeries / Procedures: Yes  Date of Onset of Symptoms: 11/27/2013  Guideline(s) Used:  Neurologic Deficit  Disposition Per Guideline:   See Today in Office  Reason For Disposition Reached:   Patient wants to be seen  Advice Given:  Call Back If:  Symptoms do not go away within 30 minutes  You become worse.  RN Overrode Recommendation:  Make Appointment  Patient would like to see Dr. Elease Hashimoto. No availability. Requesting to be worked in. PLEASE REVIEW AND ADVISE.  CONTACT PATIENT.

## 2013-11-29 NOTE — Progress Notes (Signed)
Pre visit review using our clinic review tool, if applicable. No additional management support is needed unless otherwise documented below in the visit note. 

## 2013-11-29 NOTE — Progress Notes (Signed)
Subjective:    Patient ID: [name], female    DOB: [birth year], 80 y.o.   MRN: [id number]  HPI 80 year old white female, nonsmoker with a history of transient ischemic attacks currently on Plavix and sent today after having blurred vision in her right eye this occurred abruptly on Saturday morning. Reports temporary vision loss that was restored approximately 2 minutes later. Denies any lightheadedness, dizziness, headaches actively. Has a history of vertigo and sees Dr. Claudina Lick. She has had MRIs, EEGs and carotid Doppler studies done in the past. Most recent carotid Doppler was in May of 2014. Has a stent placed to the left carotid artery.   Review of Systems  Constitutional: Negative.   HENT: Negative.   Eyes: Positive for visual disturbance.  Respiratory: Negative.   Cardiovascular: Negative.  Negative for chest pain and palpitations.  Endocrine: Negative.   Genitourinary: Negative.   Musculoskeletal: Negative.   Skin: Negative.   Allergic/Immunologic: Negative.   Neurological: Negative.   Hematological: Negative.   Psychiatric/Behavioral: Negative.    Past Medical History  Diagnosis Date  . COLONIC POLYPS, ADENOMATOUS 12/26/2009  . DIABETES MELLITUS, TYPE II 08/11/2006  . HYPERLIPIDEMIA 09/09/2006  . ANEMIA, NORMOCYTIC 11/30/2009  . HYPERTENSION 09/09/2006  . CAD, NATIVE VESSEL 04/27/2008    Cardiac catheterization 6/08: EF 55%, 1+ MR, Dx 50%, LAD 50%, dLAD 40%, OM1 50-60% (up to 70%), pOM 30%, pRCA 40%, mRCA 50-60%, pPDA 50-60% (up to 70%).  Medical therapy was recommended  . LEFT BUNDLE BRANCH BLOCK 08/11/2006  . Atrial flutter 04/12/2009  . TIA 09/02/2006    Echocardiogram 6/12: Mild to moderate MR, trace AI, trace TR, PASP 34, bubble study negative for intracardiac shunting, normal EF (greater than 55%)  . HEMORRHOIDS-INTERNAL 12/26/2009  . GERD 02/16/2007  . DIVERTICULOSIS, COLON 08/11/2006  . OSTEOARTHRITIS 08/11/2006  . OSTEOPOROSIS 08/11/2006  . Carotid stenosis    dopplers 5/46: LICA 27-03%  . Hemorrhoids     History   Social History  . Marital Status: Widowed    Spouse Name: N/A    Number of Children: 3  . Years of Education: 12   Occupational History  .     Social History Main Topics  . Smoking status: Never Smoker   . Smokeless tobacco: Never Used  . Alcohol Use: Yes     Comment: 1 glass of wine with dinner every now and then  . Drug Use: No  . Sexual Activity: Not on file   Other Topics Concern  . Not on file   Social History Narrative   Patient is a widow and lives alone.   Patient has three adult children.   Patient is retired.   Patient has a high school.   Patient is right-handed.   Patient does not drink any caffeine.    Past Surgical History  Procedure Laterality Date  . Tonsillectomy    . Eye surgery      catarac  . Closed reduction metatarsal fracture      right 5th  . Colonoscopy w/ polypectomy  2001  . Carotid endarterectomy Left 12-05-2010    Family History  Problem Relation Age of Onset  . Heart disease Mother 14  . Kidney disease Mother   . Congestive Heart Failure Father 10  . Breast cancer Sister   . Colon cancer Brother   . Heart disease Brother   . Uterine cancer Sister   . Heart attack Mother     Allergies  Allergen Reactions  . Doxycycline  Hyclate     REACTION: upset stomach  . Nitrofurantoin     REACTION: unspecified  . Sulfamethoxazole-Trimethoprim     REACTION: unspecified  . Sulfonamide Derivatives     REACTION: rash    Current Outpatient Prescriptions on File Prior to Visit  Medication Sig Dispense Refill  . Ascorbic Acid (VITAMIN C PO) Take by mouth.      Marland Kitchen aspirin 81 MG tablet Take 81 mg by mouth daily.        . calcium carbonate 200 MG capsule Take 250 mg by mouth 2 (two) times daily with a meal.        . Cholecalciferol (VITAMIN D PO) Take 1 tablet by mouth daily.       . clopidogrel (PLAVIX) 75 MG tablet TAKE 1 TABLET DAILY  90 tablet  3  . Cyanocobalamin (VITAMIN B  12 PO) Take 1 tablet by mouth daily.       . fish oil-omega-3 fatty acids 1000 MG capsule Take 1 g by mouth daily.       Marland Kitchen gabapentin (NEURONTIN) 100 MG capsule Take 1 capsule (100 mg total) by mouth as needed.  90 capsule  3  . glucose blood test strip Use as instructed once a week      . hydrocortisone (ANUSOL-HC) 2.5 % rectal cream Place 1 application rectally 2 (two) times daily.  90 g  3  . metoprolol tartrate (LOPRESSOR) 25 MG tablet TAKE ONE-HALF (1/2) TABLET (12.5 MG TOTAL) TWICE A DAY  90 tablet  2  . Multiple Vitamins-Minerals (OCUVITE ADULT 50+ PO) Take by mouth daily.        . ONE TOUCH LANCETS MISC Use daily as directed  200 each  3  . pantoprazole (PROTONIX) 20 MG tablet Take 1 tablet (20 mg total) by mouth every other day.  90 tablet  1  . simvastatin (ZOCOR) 40 MG tablet TAKE 1 TABLET AT BEDTIME  90 tablet  3  . VITAMIN E PO Take 1 tablet by mouth daily.       Marland Kitchen zolpidem (AMBIEN) 10 MG tablet Take 1 tablet (10 mg total) by mouth at bedtime as needed. For sleep  90 tablet  1   No current facility-administered medications on file prior to visit.    BP 122/68  Temp(Src) 98.6 F (37 C) (Oral)chart    Objective:   Physical Exam  Constitutional: She is oriented to person, place, and time. She appears well-developed and well-nourished.  HENT:  Right Ear: External ear normal.  Left Ear: External ear normal.  Mouth/Throat: Oropharynx is clear and moist.  Eyes: Conjunctivae are normal. Pupils are equal, round, and reactive to light.  Neck: Normal range of motion. Neck supple. No thyromegaly present.  Cardiovascular: Normal rate, regular rhythm and normal heart sounds.   Pulmonary/Chest: Effort normal and breath sounds normal.  Abdominal: Soft. Bowel sounds are normal.  Musculoskeletal: Normal range of motion.  Neurological: She is alert and oriented to person, place, and time.  Skin: Skin is warm and dry.  Psychiatric: She has a normal mood and affect.            Assessment & Plan:  Hayes was seen today for vision changes.  Diagnoses and associated orders for this visit:  Visual changes - Basic Metabolic Panel - Hepatic Function Panel - Carotid duplex; Future - POCT urinalysis dipstick  Other specified transient cerebral ischemias - POCT urinalysis dipstick    Discussed the pathophysiology of transient ischemic attacks. Schedule Doppler.  Heart healthy diet. Advised her to go to the emergency department if an event such as this occurs again in the future. Do not delay.

## 2013-11-29 NOTE — Patient Instructions (Signed)
Transient Ischemic Attack  A transient ischemic attack (TIA) is a "warning stroke" that causes stroke-like symptoms. Unlike a stroke, a TIA does not cause permanent damage to the brain. The symptoms of a TIA can happen very fast and do not last long. It is important to know the symptoms of a TIA and what to do. This can help prevent a major stroke or death.  CAUSES   · A TIA is caused by a temporary blockage in an artery in the brain or neck (carotid artery). The blockage does not allow the brain to get the blood supply it needs and can cause different symptoms. The blockage can be caused by either:  ¨ A blood clot.  ¨ Fatty buildup (plaque) in a neck or brain artery.  RISK FACTORS  · High blood pressure (hypertension).  · High cholesterol.  · Diabetes mellitus.  · Heart disease.  · The build up of plaque in the blood vessels (peripheral artery disease or atherosclerosis).  · The build up of plaque in the blood vessels providing blood and oxygen to the brain (carotid artery stenosis).  · An abnormal heart rhythm (atrial fibrillation).  · Obesity.  · Smoking.  · Taking oral contraceptives (especially in combination with smoking).  · Physical inactivity.  · A diet high in fats, salt (sodium), and calories.  · Alcohol use.  · Use of illegal drugs (especially cocaine and methamphetamine).  · Being female.  · Being African American.  · Being over the age of 55.  · Family history of stroke.  · Previous history of blood clots, stroke, TIA, or heart attack.  · Sickle cell disease.  SYMPTOMS   TIA symptoms are the same as a stroke but are temporary. These symptoms usually develop suddenly, or may be newly present upon awakening from sleep:  · Sudden weakness or numbness of the face, arm, or leg, especially on one side of the body.  · Sudden trouble walking or difficulty moving arms or legs.  · Sudden confusion.  · Sudden personality changes.  · Trouble speaking (aphasia) or understanding.  · Difficulty swallowing.  · Sudden  trouble seeing in one or both eyes.  · Double vision.  · Dizziness.  · Loss of balance or coordination.  · Sudden severe headache with no known cause.  · Trouble reading or writing.  · Loss of bowel or bladder control.  · Loss of consciousness.  DIAGNOSIS   Your caregiver may be able to determine the presence or absence of a TIA based on your symptoms, history, and physical exam. Computed tomography (CT scan) of the brain is usually performed to help identify a TIA. Other tests may be done to diagnose a TIA. These tests may include:  · Electrocardiography.  · Continuous heart monitoring.  · Echocardiography.  · Carotid ultrasonography.  · Magnetic resonance imaging (MRI).  · A scan of the brain circulation.  · Blood tests.  PREVENTION   The risk of a TIA can be decreased by appropriately treating high blood pressure, high cholesterol, diabetes, heart disease, and obesity and by quitting smoking, limiting alcohol, and staying physically active.  TREATMENT   Time is of the essence. Since the symptoms of TIA are the same as a stroke, it is important to seek treatment as soon as possible because you may need a medicine to dissolve the clot (thrombolytic) that cannot be given if too much time has passed. Treatment options vary. Treatment options may include rest, oxygen, intravenous (IV) fluids,   and medicines to thin the blood (anticoagulants). Medicines and diet may be used to address diabetes, high blood pressure, and other risk factors. Measures will be taken to prevent short-term and long-term complications, including infection from breathing foreign material into the lungs (aspiration pneumonia), blood clots in the legs, and falls. Treatment options include procedures to either remove plaque in the carotid arteries or dilate carotid arteries that have narrowed due to plaque. Those procedures are:  · Carotid endarterectomy.  · Carotid angioplasty and stenting.  HOME CARE INSTRUCTIONS   · Take all medicines prescribed  by your caregiver. Follow the directions carefully. Medicines may be used to control risk factors for a stroke. Be sure you understand all your medicine instructions.  · You may be told to take aspirin or the anticoagulant warfarin. Warfarin needs to be taken exactly as instructed.  ¨ Taking too much or too little warfarin is dangerous. Too much warfarin increases the risk of bleeding. Too little warfarin continues to allow the risk for blood clots. While taking warfarin, you will need to have regular blood tests to measure your blood clotting time. A PT blood test measures how long it takes for blood to clot. Your PT is used to calculate another value called an INR. Your PT and INR help your caregiver to adjust your dose of warfarin. The dose can change for many reasons. It is critically important that you take warfarin exactly as prescribed.  ¨ Many foods, especially foods high in vitamin K can interfere with warfarin and affect the PT and INR. Foods high in vitamin K include spinach, kale, broccoli, cabbage, collard and turnip greens, brussels sprouts, peas, cauliflower, seaweed, and parsley as well as beef and pork liver, green tea, and soybean oil. You should eat a consistent amount of foods high in vitamin K. Avoid major changes in your diet, or notify your caregiver before changing your diet. Arrange a visit with a dietitian to answer your questions.  ¨ Many medicines can interfere with warfarin and affect the PT and INR. You must tell your caregiver about any and all medicines you take, this includes all vitamins and supplements. Be especially cautious with aspirin and anti-inflammatory medicines. Do not take or discontinue any prescribed or over-the-counter medicine except on the advice of your caregiver or pharmacist.  ¨ Warfarin can have side effects, such as excessive bruising or bleeding. You will need to hold pressure over cuts for longer than usual. Your caregiver or pharmacist will discuss other  potential side effects.  ¨ Avoid sports or activities that may cause injury or bleeding.  ¨ Be mindful when shaving, flossing your teeth, or handling sharp objects.  ¨ Alcohol can change the body's ability to handle warfarin. It is best to avoid alcoholic drinks or consume only very small amounts while taking warfarin. Notify your caregiver if you change your alcohol intake.  ¨ Notify your dentist or other caregivers before procedures.  · Eat a diet that includes 5 or more servings of fruits and vegetables each day. This may reduce the risk of stroke. Certain diets may be prescribed to address high blood pressure, high cholesterol, diabetes, or obesity.  ¨ A low-sodium, low-saturated fat, low-trans fat, low-cholesterol diet is recommended to manage high blood pressure.  ¨ A low-saturated fat, low-trans fat, low-cholesterol, and high-fiber diet may control cholesterol levels.  ¨ A controlled-carbohydrate, controlled-sugar diet is recommended to manage diabetes.  ¨ A reduced-calorie, low-sodium, low-saturated fat, low-trans fat, low-cholesterol diet is recommended to manage obesity.  ·   Maintain a healthy weight.  · Stay physically active. It is recommended that you get at least 30 minutes of activity on most or all days.  · Do not smoke.  · Limit alcohol use even if you are not taking warfarin. Moderate alcohol use is considered to be:  ¨ No more than 2 drinks each day for men.  ¨ No more than 1 drink each day for nonpregnant women.  · Stop drug abuse.  · Home safety. A safe home environment is important to reduce the risk of falls. Your caregiver may arrange for specialists to evaluate your home. Having grab bars in the bedroom and bathroom is often important. Your caregiver may arrange for equipment to be used at home, such as raised toilets and a seat for the shower.  · Follow all instructions for follow-up with your caregiver. This is very important. This includes any referrals and lab tests. Proper follow up can  prevent a stroke or another TIA from occurring.  SEEK MEDICAL CARE IF:  · You have personality changes.  · You have difficulty swallowing.  · You are seeing double.  · You have dizziness.  · You have a fever.  · You have skin breakdown.  SEEK IMMEDIATE MEDICAL CARE IF:   Any of these symptoms may represent a serious problem that is an emergency. Do not wait to see if the symptoms will go away. Get medical help right away. Call your local emergency services (911 in [place] Do not drive yourself to the hospital.  · You have sudden weakness or numbness of the face, arm, or leg, especially on one side of the body.  · You have sudden trouble walking or difficulty moving arms or legs.  · You have sudden confusion.  · You have trouble speaking (aphasia) or understanding.  · You have sudden trouble seeing in one or both eyes.  · You have a loss of balance or coordination.  · You have a sudden, severe headache with no known cause.  · You have new chest pain or an irregular heartbeat.  · You have a partial or total loss of consciousness.  MAKE SURE YOU:   · Understand these instructions.  · Will watch your condition.  · Will get help right away if you are not doing well or get worse.  Document Released: 11/07/2004 Document Revised: 02/02/2013 Document Reviewed: 05/05/2013  ExitCare® Patient Information ©2015 [employer]. This information is not intended to replace advice given to you by your health care provider. Make sure you discuss any questions you have with your health care provider.

## 2013-11-29 NOTE — Telephone Encounter (Signed)
Per Dr.B needs to be seen. Pt is scheduled to see Padonda today 11/29/2013

## 2013-12-01 ENCOUNTER — Emergency Department (HOSPITAL_COMMUNITY)
Admission: EM | Admit: 2013-12-01 | Discharge: 2013-12-01 | Disposition: A | Discharge status: Home / Self Care | Payer: Medicare Other | Attending: Emergency Medicine | Admitting: Emergency Medicine

## 2013-12-01 ENCOUNTER — Emergency Department (HOSPITAL_COMMUNITY): Payer: Medicare Other

## 2013-12-01 ENCOUNTER — Encounter (HOSPITAL_COMMUNITY): Payer: Self-pay | Admitting: Emergency Medicine

## 2013-12-01 DIAGNOSIS — I10 Essential (primary) hypertension: Secondary | ICD-10-CM | POA: Insufficient documentation

## 2013-12-01 DIAGNOSIS — M810 Age-related osteoporosis without current pathological fracture: Secondary | ICD-10-CM | POA: Insufficient documentation

## 2013-12-01 DIAGNOSIS — D649 Anemia, unspecified: Secondary | ICD-10-CM | POA: Diagnosis not present

## 2013-12-01 DIAGNOSIS — G459 Transient cerebral ischemic attack, unspecified: Secondary | ICD-10-CM | POA: Diagnosis present

## 2013-12-01 DIAGNOSIS — Z86010 Personal history of colonic polyps: Secondary | ICD-10-CM | POA: Diagnosis not present

## 2013-12-01 DIAGNOSIS — I4892 Unspecified atrial flutter: Secondary | ICD-10-CM | POA: Insufficient documentation

## 2013-12-01 DIAGNOSIS — K219 Gastro-esophageal reflux disease without esophagitis: Secondary | ICD-10-CM | POA: Diagnosis not present

## 2013-12-01 DIAGNOSIS — Z7901 Long term (current) use of anticoagulants: Secondary | ICD-10-CM | POA: Insufficient documentation

## 2013-12-01 DIAGNOSIS — Z79899 Other long term (current) drug therapy: Secondary | ICD-10-CM | POA: Insufficient documentation

## 2013-12-01 DIAGNOSIS — E119 Type 2 diabetes mellitus without complications: Secondary | ICD-10-CM | POA: Insufficient documentation

## 2013-12-01 DIAGNOSIS — Z9889 Other specified postprocedural states: Secondary | ICD-10-CM | POA: Insufficient documentation

## 2013-12-01 DIAGNOSIS — Z7982 Long term (current) use of aspirin: Secondary | ICD-10-CM | POA: Insufficient documentation

## 2013-12-01 DIAGNOSIS — M1990 Unspecified osteoarthritis, unspecified site: Secondary | ICD-10-CM | POA: Diagnosis not present

## 2013-12-01 DIAGNOSIS — E785 Hyperlipidemia, unspecified: Secondary | ICD-10-CM | POA: Insufficient documentation

## 2013-12-01 DIAGNOSIS — I2510 Atherosclerotic heart disease of native coronary artery without angina pectoris: Secondary | ICD-10-CM | POA: Diagnosis not present

## 2013-12-01 LAB — URINALYSIS, ROUTINE W REFLEX MICROSCOPIC
Bilirubin Urine: NEGATIVE
GLUCOSE, UA: NEGATIVE mg/dL
Hgb urine dipstick: NEGATIVE
Ketones, ur: NEGATIVE mg/dL
Nitrite: NEGATIVE
PROTEIN: NEGATIVE mg/dL
Specific Gravity, Urine: 1.009 (ref 1.005–1.030)
Urobilinogen, UA: 0.2 mg/dL (ref 0.0–1.0)
pH: 5 (ref 5.0–8.0)

## 2013-12-01 LAB — CBC WITH DIFFERENTIAL/PLATELET
BASOS PCT: 0 % (ref 0–1)
Basophils Absolute: 0 10*3/uL (ref 0.0–0.1)
EOS ABS: 0.1 10*3/uL (ref 0.0–0.7)
Eosinophils Relative: 1 % (ref 0–5)
HCT: 34.9 % — ABNORMAL LOW (ref 36.0–46.0)
Hemoglobin: 11.9 g/dL — ABNORMAL LOW (ref 12.0–15.0)
Lymphocytes Relative: 25 % (ref 12–46)
Lymphs Abs: 1.8 10*3/uL (ref 0.7–4.0)
MCH: 32.2 pg (ref 26.0–34.0)
MCHC: 34.1 g/dL (ref 30.0–36.0)
MCV: 94.6 fL (ref 78.0–100.0)
Monocytes Absolute: 0.6 10*3/uL (ref 0.1–1.0)
Monocytes Relative: 9 % (ref 3–12)
NEUTROS PCT: 65 % (ref 43–77)
Neutro Abs: 4.6 10*3/uL (ref 1.7–7.7)
PLATELETS: 169 10*3/uL (ref 150–400)
RBC: 3.69 MIL/uL — ABNORMAL LOW (ref 3.87–5.11)
RDW: 13.3 % (ref 11.5–15.5)
WBC: 7.1 10*3/uL (ref 4.0–10.5)

## 2013-12-01 LAB — BASIC METABOLIC PANEL
Anion gap: 14 (ref 5–15)
BUN: 23 mg/dL (ref 6–23)
CO2: 24 mEq/L (ref 19–32)
Calcium: 9.3 mg/dL (ref 8.4–10.5)
Chloride: 99 mEq/L (ref 96–112)
Creatinine, Ser: 0.95 mg/dL (ref 0.50–1.10)
GFR calc Af Amer: 59 mL/min — ABNORMAL LOW (ref >90)
GFR, EST NON AFRICAN AMERICAN: 51 mL/min — AB (ref >90)
Glucose, Bld: 97 mg/dL (ref 70–99)
POTASSIUM: 5.1 meq/L (ref 3.7–5.3)
SODIUM: 137 meq/L (ref 137–147)

## 2013-12-01 LAB — TROPONIN I

## 2013-12-01 LAB — URINE MICROSCOPIC-ADD ON

## 2013-12-01 NOTE — ED Provider Notes (Signed)
CSN: [id number]     Arrival date & time 12/01/13  1401 History   First MD Initiated Contact with Patient 12/01/13 1403     Chief Complaint  Patient presents with  . Transient Ischemic Attack     (Consider location/radiation/quality/duration/timing/severity/associated sxs/prior Treatment) HPI Comments: Pt comes in with cc of visual disturbance.  Hx of carotid stenosis, L sided endarterectomy 2 years ago, and Rt side being workup up by CV team currently, she has a doppler study scheduled for tomorrow. PMHx also of DM, HTN, CAD. PRt pt, she was watching TV, and noticed that she couldn't see some of the screen from the left side of her eye. She felt that the top left quadrant, part of that area of the TV was missing. Pt had similar sx from her right eye on Sunday. Sx lasted for < 5 min, no repeat. Denies any numbness, tingling, weakness associated  With the sx.  The history is provided by the patient.    Past Medical History  Diagnosis Date  . COLONIC POLYPS, ADENOMATOUS 12/26/2009  . DIABETES MELLITUS, TYPE II 08/11/2006  . HYPERLIPIDEMIA 09/09/2006  . ANEMIA, NORMOCYTIC 11/30/2009  . HYPERTENSION 09/09/2006  . CAD, NATIVE VESSEL 04/27/2008    Cardiac catheterization 6/08: EF 55%, 1+ MR, Dx 50%, LAD 50%, dLAD 40%, OM1 50-60% (up to 70%), pOM 30%, pRCA 40%, mRCA 50-60%, pPDA 50-60% (up to 70%).  Medical therapy was recommended  . LEFT BUNDLE BRANCH BLOCK 08/11/2006  . Atrial flutter 04/12/2009  . TIA 09/02/2006    Echocardiogram 6/12: Mild to moderate MR, trace AI, trace TR, PASP 34, bubble study negative for intracardiac shunting, normal EF (greater than 55%)  . HEMORRHOIDS-INTERNAL 12/26/2009  . GERD 02/16/2007  . DIVERTICULOSIS, COLON 08/11/2006  . OSTEOARTHRITIS 08/11/2006  . OSTEOPOROSIS 08/11/2006  . Carotid stenosis     dopplers 6/19: LICA 50-93%  . Hemorrhoids    Past Surgical History  Procedure Laterality Date  . Tonsillectomy    . Eye surgery      catarac  . Closed reduction  metatarsal fracture      right 5th  . Colonoscopy w/ polypectomy  2001  . Carotid endarterectomy Left 12-05-2010   Family History  Problem Relation Age of Onset  . Heart disease Mother 50  . Kidney disease Mother   . Congestive Heart Failure Father 83  . Breast cancer Sister   . Colon cancer Brother   . Heart disease Brother   . Uterine cancer Sister   . Heart attack Mother    History  Substance Use Topics  . Smoking status: Never Smoker   . Smokeless tobacco: Never Used  . Alcohol Use: Yes   OB History   Grav Para Term Preterm Abortions TAB SAB Ect Mult Living                 Review of Systems  Constitutional: Positive for activity change.  Eyes: Positive for visual disturbance.  Respiratory: Negative for shortness of breath.   Cardiovascular: Negative for chest pain.  Gastrointestinal: Negative for nausea, vomiting and abdominal pain.  Genitourinary: Negative for dysuria.  Musculoskeletal: Negative for neck pain.  Neurological: Negative for dizziness, tremors, speech difficulty, weakness, light-headedness and headaches.      Allergies  Doxycycline hyclate; Nitrofurantoin; Sulfamethoxazole-trimethoprim; and Sulfonamide derivatives  Home Medications   Prior to Admission medications   Medication Sig Start Date End Date Taking? Authorizing Provider  Ascorbic Acid (VITAMIN C PO) Take 1 tablet by mouth daily.  Yes Historical Provider, MD  aspirin 81 MG tablet Take 81 mg by mouth daily.     Yes Historical Provider, MD  calcium carbonate 200 MG capsule Take 250 mg by mouth daily.    Yes Historical Provider, MD  Cholecalciferol (VITAMIN D PO) Take 1 tablet by mouth daily.    Yes Historical Provider, MD  clopidogrel (PLAVIX) 75 MG tablet Take 75 mg by mouth daily.   Yes Historical Provider, MD  Cyanocobalamin (VITAMIN B 12 PO) Take 1 tablet by mouth daily.    Yes Historical Provider, MD  fish oil-omega-3 fatty acids 1000 MG capsule Take 1 g by mouth daily.    Yes  Historical Provider, MD  gabapentin (NEURONTIN) 100 MG capsule Take 1 capsule (100 mg total) by mouth as needed. 10/22/13  Yes Eulas Post, MD  metoprolol tartrate (LOPRESSOR) 25 MG tablet Take 12.5 mg by mouth 2 (two) times daily.   Yes Historical Provider, MD  Multiple Vitamins-Minerals (OCUVITE ADULT 50+ PO) Take 1 tablet by mouth daily.    Yes Historical Provider, MD  pantoprazole (PROTONIX) 20 MG tablet Take 1 tablet (20 mg total) by mouth every other day. 10/28/13  Yes Eulas Post, MD  simvastatin (ZOCOR) 40 MG tablet Take 40 mg by mouth at bedtime.   Yes Historical Provider, MD  VITAMIN E PO Take 1 tablet by mouth daily.    Yes Historical Provider, MD  zolpidem (AMBIEN) 10 MG tablet Take 1 tablet (10 mg total) by mouth at bedtime as needed. For sleep 06/30/13  Yes Eulas Post, MD  glucose blood test strip Use as instructed once a week 08/19/11   Eulas Post, MD  ONE TOUCH LANCETS MISC Use daily as directed 03/25/11   Eulas Post, MD   BP 176/65  Pulse 70  Temp(Src) 97.5 F (36.4 C) (Oral)  Resp 18  Ht 5' 6.5" (1.689 m)  Wt 130 lb (58.968 kg)  BMI 20.67 kg/m2  SpO2 99% Physical Exam  Nursing note and vitals reviewed. Constitutional: She is oriented to person, place, and time. She appears well-developed and well-nourished.  HENT:  Head: Normocephalic and atraumatic.  Eyes: EOM are normal. Pupils are equal, round, and reactive to light.  Neck: Neck supple.  Cardiovascular: Normal rate, regular rhythm and normal heart sounds.   No murmur heard. Pulmonary/Chest: Effort normal. No respiratory distress.  Abdominal: Soft. She exhibits no distension. There is no tenderness. There is no rebound and no guarding.  Neurological: She is alert and oriented to person, place, and time. No cranial nerve deficit. Coordination normal.  Skin: Skin is warm and dry.    ED Course  Procedures (including critical care time) Labs Review Labs Reviewed  CBC WITH DIFFERENTIAL  - Abnormal; Notable for the following:    RBC 3.69 (*)    Hemoglobin 11.9 (*)    HCT 34.9 (*)    All other components within normal limits  BASIC METABOLIC PANEL - Abnormal; Notable for the following:    GFR calc non Af Amer 51 (*)    GFR calc Af Amer 59 (*)    All other components within normal limits  URINALYSIS, ROUTINE W REFLEX MICROSCOPIC - Abnormal; Notable for the following:    Leukocytes, UA SMALL (*)    All other components within normal limits  TROPONIN I  URINE MICROSCOPIC-ADD ON    Imaging Review No results found.   EKG Interpretation None      MDM   Final diagnoses:  Transient cerebral ischemia, unspecified transient cerebral ischemia type   Pt comes in with some visual complains. Currently sx free. Concerns for TIA, Amaurosis Fugax. However, there was no complete vision loss of a eye, but instead unilateral, partial hemianopsia.  I called pt's Neurologist at [facility], and she graciously answered my call. She thinks pt had a TIA. I asked her that given that pt is on dual antiplatelet therapy already, is there any need for MRI? I was told that MRI would be beneficial, looking at other risk modification targets, and potentially for the down stream study tomorrow.   ABCD2 score is HIGH. She agrees, that if the MRI is neg, pt can be discharged with proper d/c instructions and that the dopplers can be done as outpatient.  Pt informed of the plan. I asked her to see her Neurologist again next week. I asked her to come to the ER if her symptoms return. She is aware of the plan - and understands that if the MRi is neg, she will e going home. Pt wants to go home right away, however, has decided to wait for the MRI to be done after the rational for it was discussed.    Varney Biles, MD 12/01/13 1730

## 2013-12-01 NOTE — Discharge Instructions (Signed)
Amaurosis Fugax °Amaurosis fugax is a condition in which a person loses sight in one eye. The loss of vision in the affected eye may be total or partial. It usually lasts just a few seconds or minutes. Then, it returns to normal. Occasionally, it may last for several hours. This is caused by interruption of blood flow to the artery that supplies blood to the retina (lining at the back of the eye, contains nerves needed for sight). The temporary loss of blood flow causes symptoms similar to a stroke. The family of symptoms that happen from a loss of blood flow is called a Transient Ischemic Attack (TIA, mini-stroke). In the case of amaurosis fugax, the eye is the organ that is involved. °SYMPTOMS  °· Painless, sudden loss of vision in one eye. °· Visual loss is often from the top down, appearing like a curtain being pulled down over the field of vision. °· Rapid return of vision. Vision generally comes back in a few minutes to several hours. °CAUSES  °TIAs and amaurosis fugax are caused by a loss of blood flow. This can be due to a buildup of cholesterol and fats (plaque) in the arteries or the heart. If some of that plaque comes off the artery and gets into the bloodstream, it can flow to the artery that supplies blood to the retina, blocking the flow of blood to the retina. When that happens, vision is lost for as long as the blood flow is interrupted. °Factors that make it more likely you will have amaurosis fugax at some point include: °· Smoking. °· Poorly controlled diabetes. °· High blood pressure. °· High cholesterol levels. °Medical conditions that may increase the risk of an attack of amaurosis fugax include: °· Heart disease. °· Diseases of the heart valves. °· Certain diseases of the blood (sickle cell anemia, leukemia). °· Blood clotting (coagulation) disorders. °· Artery inflammation (temporal arteritis, giant cell arteritis). °Since amaurosis fugax is an "incomplete stroke," in some people it can be a  sign of an increased risk for an actual stroke. A stroke can result in permanent vision loss or loss of other body functions. As a result, caring for yourself after amaurosis fugax means taking many of the same steps you should take to prevent a stroke. °HOME CARE INSTRUCTIONS  °· Only take over-the-counter or prescription medicines for pain, discomfort, or fever as directed by your caregiver. °· Take any medicines that are prescribed for control of your blood pressure and cholesterol levels. °· Keep diabetes under control as well as possible. °· Stop smoking. °· Follow diet instructions, if your caregiver has given them to you. °· Try to get at least 30 minutes of moderate physical activity every day. If you have not been active, talk to your caregiver about how to get started. °SEEK IMMEDIATE MEDICAL CARE IF:  °· You lose vision in one or both eyes again, even if only for a short period of time. °· You lose vision in one eye and it does not recover within a very brief time (less than 5-10 minutes). The sooner you see an eye specialist (ophthalmologist), the better the chance of regaining some vision, in the case of a central retinal artery occlusion (CRAO, blockage of central retinal artery). However, most cases of CRAO result in some degree of permanent visual loss, even with aggressive treatment. °· You have symptoms of a stroke: °¨ Weakness in one side of your body. °¨ Difficulty speaking or thinking clearly. °¨ Lack of coordination. °Document   Released: 11/07/2007 Document Revised: 04/22/2011 Document Reviewed: 11/07/2007 °ExitCare® Patient Information ©2015 [employer]. This information is not intended to replace advice given to you by your health care provider. Make sure you discuss any questions you have with your health care provider. ° °

## 2013-12-01 NOTE — ED Provider Notes (Signed)
MR Brain Wo Contrast (Final result)  Result time: 12/01/13 17:56:36    Final result by Rad Results In Interface (12/01/13 17:56:36)    Narrative:   CLINICAL DATA: Acute onset of visual deficit. Left homonymous hemianopsia or left upper quadrant quadranopsia.  EXAM: MRI HEAD WITHOUT CONTRAST  TECHNIQUE: Multiplanar, multiecho pulse sequences of the brain and surrounding structures were obtained without intravenous contrast.  COMPARISON: CT head without contrast 10/25/2013. MRI brain 08/13/2006.  FINDINGS: The diffusion-weighted images demonstrate no evidence for acute or subacute infarction. No hemorrhage or mass lesion is present. The ventricles are proportionate to the degree of atrophy. Periventricular white matter changes demonstrate some progression since the prior study.  Flow is present in the major intracranial arteries. The patient is status post bilateral lens extractions. The globes and orbits are otherwise intact. The paranasal sinuses and the mastoid air cells are clear.  IMPRESSION: 1. No acute intracranial abnormality or significant interval change. 2. Slight progression of periventricular white matter disease which is mostly chronic. 3. Stable moderate atrophy.   Electronically Signed By: Lawrence Santiago M.D. On: 12/01/2013 17:56    MRI without signs of acute infarct. Findings discussed with patient and family. Patient is comfortable being discharged home and following up to get her carotid Dopplers completed in the morning. She was given strict return precautions if symptoms reoccur  Blanchie Dessert, MD 12/01/13 1807

## 2013-12-01 NOTE — ED Notes (Signed)
Per EMS pt was reading at 1255 and felt "some of the letters were missing in left eye" sts the visual disturbance lasted les than 5 minutes and resolved completely. Pt had negative stroke scale. Denies headache, weakness. Pt sts had a similar episode on Saturday but with the right eye. Pt followed up with PCP and was told to call EMS   11/26/2011- Pt had left carotid endercarteomy  EKG- SR left BBB

## 2013-12-01 NOTE — ED Notes (Signed)
Dr. Nanavati at bedside 

## 2013-12-02 ENCOUNTER — Ambulatory Visit (HOSPITAL_COMMUNITY): Payer: Medicare Other | Attending: Cardiology | Admitting: *Deleted

## 2013-12-02 DIAGNOSIS — E785 Hyperlipidemia, unspecified: Secondary | ICD-10-CM | POA: Diagnosis not present

## 2013-12-02 DIAGNOSIS — I2510 Atherosclerotic heart disease of native coronary artery without angina pectoris: Secondary | ICD-10-CM | POA: Insufficient documentation

## 2013-12-02 DIAGNOSIS — H538 Other visual disturbances: Secondary | ICD-10-CM | POA: Diagnosis present

## 2013-12-02 DIAGNOSIS — E119 Type 2 diabetes mellitus without complications: Secondary | ICD-10-CM | POA: Diagnosis not present

## 2013-12-02 DIAGNOSIS — Z8673 Personal history of transient ischemic attack (TIA), and cerebral infarction without residual deficits: Secondary | ICD-10-CM | POA: Insufficient documentation

## 2013-12-02 DIAGNOSIS — H539 Unspecified visual disturbance: Secondary | ICD-10-CM

## 2013-12-02 DIAGNOSIS — R55 Syncope and collapse: Secondary | ICD-10-CM

## 2013-12-02 DIAGNOSIS — I10 Essential (primary) hypertension: Secondary | ICD-10-CM | POA: Diagnosis not present

## 2013-12-02 DIAGNOSIS — I6529 Occlusion and stenosis of unspecified carotid artery: Secondary | ICD-10-CM | POA: Insufficient documentation

## 2013-12-02 NOTE — Progress Notes (Signed)
Carotid Duplex Performed 

## 2013-12-03 ENCOUNTER — Ambulatory Visit: Payer: Medicare Other | Admitting: Cardiology

## 2013-12-03 ENCOUNTER — Telehealth: Payer: Self-pay | Admitting: Family Medicine

## 2013-12-03 NOTE — Telephone Encounter (Signed)
Pt stated that you were going to check her A1C when she came in on Monday.

## 2013-12-03 NOTE — Telephone Encounter (Signed)
Pt would like call back about her A1C Results

## 2013-12-03 NOTE — Telephone Encounter (Signed)
Pt aware that a1c was done in September and is not necessary at this time. She is aware that she is diabetic but does not take medication for it, as she controls it with diet

## 2013-12-06 ENCOUNTER — Ambulatory Visit (INDEPENDENT_AMBULATORY_CARE_PROVIDER_SITE_OTHER): Payer: Medicare Other | Admitting: Cardiology

## 2013-12-06 ENCOUNTER — Encounter: Payer: Self-pay | Admitting: Cardiology

## 2013-12-06 VITALS — BP 180/70 | Ht 66.5 in | Wt 129.4 lb

## 2013-12-06 DIAGNOSIS — I2510 Atherosclerotic heart disease of native coronary artery without angina pectoris: Secondary | ICD-10-CM

## 2013-12-06 DIAGNOSIS — R55 Syncope and collapse: Secondary | ICD-10-CM

## 2013-12-06 DIAGNOSIS — G458 Other transient cerebral ischemic attacks and related syndromes: Secondary | ICD-10-CM

## 2013-12-06 DIAGNOSIS — I639 Cerebral infarction, unspecified: Secondary | ICD-10-CM

## 2013-12-06 DIAGNOSIS — E785 Hyperlipidemia, unspecified: Secondary | ICD-10-CM

## 2013-12-06 DIAGNOSIS — I10 Essential (primary) hypertension: Secondary | ICD-10-CM

## 2013-12-06 HISTORY — DX: Cerebral infarction, unspecified: I63.9

## 2013-12-06 MED ORDER — AMLODIPINE BESYLATE 2.5 MG PO TABS: 2.5000 mg | ORAL_TABLET | Freq: Every day | ORAL | Status: DC

## 2013-12-06 MED ORDER — ATORVASTATIN CALCIUM 20 MG PO TABS: 20.0000 mg | ORAL_TABLET | Freq: Every day | ORAL | Status: DC

## 2013-12-06 NOTE — Patient Instructions (Addendum)
Start amlodipine 2.5mg  daily.   Stop simvastatin.   Start atorvastatin 20mg  daily.   Your physician has requested that you regularly monitor and record your blood pressure readings at home. Please use the same machine at the same time of day to check your readings and record them . I will call you in about 2 weeks to get the readings. [name]  You have been referred to EP(Dr Allred, Dr Caryl Comes or Dr Lovena Le) for evaluation for [facility] placement.   Your physician recommends that you schedule a follow-up appointment in: about 6 weeks with Dr Aundra Dubin.

## 2013-12-06 NOTE — Progress Notes (Signed)
Patient ID: [name], female   DOB: July 17, 1920, 80 y.o.   MRN: [id number] PCP: Dr. Elease Hashimoto  80 yo with history of carotid stenosis s/p CEA, nonobstructive CAD, syncope, and recent TIAs presents for cardiology followup.  She has been seen by Dr Lia Foyer in the past and is seeing me for the first time today.  She had atrial fibrillation documented years ago but has not had a documented recurrence.  She is not on anticoagulation due to concern for profuse hemorrhoidal bleeding.  Last Cardiolite in 11/14 showed no ischemia or infarction.  In 8/15, she had a syncopal event while standing in the kitchen.  She was not unconscious for long.  She then had 2 episodes earlier this month concerning for TIAs.  Both times, she lost a portion of her left visual field transiently.  MRI after the 2nd episode showed no evidence for CVA.  She was thought to have a TIA.  She had an ophthalmology appointment and was told that there appears to be nothing wrong with her eyes themselves.   BP is running high, > 259 systolic most all the time when she checks at home.  She does not get short of breath walking on flat ground.  No orthopnea/PND.  No lightheadedness or tachypalpitations.  No further syncope.  She does get some dyspnea when trying to vacuum.    ECG: NSR, LBBB  Labs (5/15): LDL 41, HDL 46.5 Labs (10/15): K 5.1, creatinine 0.95, HCT 34.9  PMH: 1. H/o TIA: Possible TIA in 10/15 with visual field cut.  MRI (10/15) with no CVA. 2. Carotid stenosis: Left CEA in 10/12.  Carotid dopplers (10/15) with 1-39% bilateral ICA stenosis.   3. Atrial fibrillation: Paroxysmal, only prior documented episode was years ago.  She was not started on anticoagulation back then due to hemorrhoidal bleeding, apparently profuse.  Event monitor (3/14) showed only NSR.  4. Hemorrhoids s/p banding.  5. HTN 6. Type II diabetes 7. Hyperlipidemia 8. LBBB 9. CAD: Nonobstructive.  LHC (6/08) with EF 55%, 1+ MR, Dx 50%, LAD 50%, dLAD 40%,  OM1 50-60% (up to 70%), pOM 30%, pRCA 40%, mRCA 50-60%, pPDA 50-60% (up to 70%).  Adenosine Cardiolite (11/14) with EF 67%, no ischemia or infarction.  10. Syncope: 8/15. EEG (5/15) was unremarkable. 11. Echo (4/14) with EF 60-65%, mild MR, aortic sclerosis without stenosis.   SH: Lives alone in [place], 1 son in [place] and 2 other kids in the [place].  No smoking or ETOH.   FH: No premature CAD  ROS: All systems reviewed and negative except as per HPI.   Current Outpatient Prescriptions  Medication Sig Dispense Refill  . Ascorbic Acid (VITAMIN C PO) Take 1 tablet by mouth daily.       Marland Kitchen aspirin 81 MG tablet Take 81 mg by mouth daily.        . calcium carbonate 200 MG capsule Take 250 mg by mouth daily.       . Cholecalciferol (VITAMIN D PO) Take 1 tablet by mouth daily.       . clopidogrel (PLAVIX) 75 MG tablet Take 75 mg by mouth daily.      . Cyanocobalamin (VITAMIN B 12 PO) Take 1 tablet by mouth daily.       . fish oil-omega-3 fatty acids 1000 MG capsule Take 1 g by mouth daily.       Marland Kitchen gabapentin (NEURONTIN) 100 MG capsule Take 1 capsule (100 mg total) by mouth as needed.  90 capsule  3  . glucose blood test strip Use as instructed once a week      . metoprolol tartrate (LOPRESSOR) 25 MG tablet Take 12.5 mg by mouth 2 (two) times daily.      . Multiple Vitamins-Minerals (OCUVITE ADULT 50+ PO) Take 1 tablet by mouth daily.       . ONE TOUCH LANCETS MISC Use daily as directed  200 each  3  . pantoprazole (PROTONIX) 20 MG tablet Take 1 tablet (20 mg total) by mouth every other day.  90 tablet  1  . VITAMIN E PO Take 1 tablet by mouth daily.       Marland Kitchen zolpidem (AMBIEN) 10 MG tablet Take 1 tablet (10 mg total) by mouth at bedtime as needed. For sleep  90 tablet  1  . amLODipine (NORVASC) 2.5 MG tablet Take 1 tablet (2.5 mg total) by mouth daily.  30 tablet  3  . atorvastatin (LIPITOR) 20 MG tablet Take 1 tablet (20 mg total) by mouth daily.  30 tablet  3   No current  facility-administered medications for this visit.    BP 180/70  Ht 5' 6.5" (1.689 m)  Wt 129 lb 6.4 oz (58.695 kg)  BMI 20.58 kg/m2 General: NAD Neck: No JVD, no thyromegaly or thyroid nodule.  Lungs: Clear to auscultation bilaterally with normal respiratory effort. CV: Nondisplaced PMI.  Heart regular S1/S2, no S3/S4, no murmur.  No peripheral edema.  No carotid bruit.  Normal pedal pulses.  Abdomen: Soft, nontender, no hepatosplenomegaly, no distention.  Skin: Intact without lesions or rashes.  Neurologic: Alert and oriented x 3.  Psych: Normal affect. Extremities: No clubbing or cyanosis.  HEENT: Normal.   Assessment/Plan: 1. Possible TIAs: Patient had 2 episodes where she lost a portion of her left visual field (?homonymous hemianopsia).  Both were transient, and head MRI in 10/15 showed no CVA.  She is currently on ASA and Plavix.  No atrial fibrillation has been identified.  I am concerned that she does indeed have paroxysmal atrial fibrillation (apparently there was an episode detected years ago), and in that case, should be anticoagulated.  I do not, however, want to commit her to anticoagulation unless there is clearly documented atrial fibrillation.  - I think that the best course in this situation would be an implanted LINQ monitor to assess for atrial fibrillation episodes; I will refer her to EP for this.  If atrial fibrillation is found, I will have her stop ASA and Plavix and go on Eliquis 2.5 mg bid (reduced dose due to 80/weight).  She has had a history of hemorrhoidal bleeding in the past that has required banding but none recently.   - Continue ASA 81 and Plavix for now unless atrial fibrillation is documented.  2. HTN: BP is running high. I will have her add amlodipine 2.5 mg daily.  She will call if she has any problems with it.  We will call her in 2 wks for her BP readings.  3. Hyperlipidemia:   As I am adding amlodipine, I will stop simvastatin (risk for interaction)  and place her on atorvastatin 20 mg daily with repeat lipids/LFTs in 2 months.  Goal LDL < 70 with known vascular disease.  4. Syncope: Episode in 8/15 without prodrome.  Patient has not had cardiac monitoring since that time.  There has been no recurrence.  This is another reason I would like the [facility] monitor.  5. Carotid stenosis: s/p CEA, followed VVS.  6. CAD: Moderate, nonobstructive disease.  No ischemic symptoms.  Continue ASA/Plavix, statin.   Loralie Champagne 12/06/2013

## 2013-12-14 ENCOUNTER — Ambulatory Visit (INDEPENDENT_AMBULATORY_CARE_PROVIDER_SITE_OTHER): Payer: Medicare Other | Admitting: Neurology

## 2013-12-14 ENCOUNTER — Encounter: Payer: Self-pay | Admitting: Neurology

## 2013-12-14 VITALS — BP 154/76 | HR 78 | Temp 98.0°F | Resp 14 | Ht 66.25 in | Wt 129.0 lb

## 2013-12-14 DIAGNOSIS — IMO0001 Reserved for inherently not codable concepts without codable children: Secondary | ICD-10-CM

## 2013-12-14 DIAGNOSIS — H8143 Vertigo of central origin, bilateral: Secondary | ICD-10-CM

## 2013-12-14 NOTE — Patient Instructions (Signed)
Cardiac Event Monitoring A cardiac event monitor is a small recording device used to help detect abnormal heart rhythms (arrhythmias). The monitor is used to record heart rhythm when noticeable symptoms such as the following occur:  Fast heartbeats (palpitations), such as heart racing or fluttering.  Dizziness.  Fainting or light-headedness.  Unexplained weakness. The monitor is wired to two electrodes placed on your chest. Electrodes are flat, sticky disks that attach to your skin. The monitor can be worn for up to 30 days. You will wear the monitor at all times, except when bathing.  HOW TO USE YOUR CARDIAC EVENT MONITOR A technician will prepare your chest for the electrode placement. The technician will show you how to place the electrodes, how to work the monitor, and how to replace the batteries. Take time to practice using the monitor before you leave the office. Make sure you understand how to send the information from the monitor to your health care provider. This requires a telephone with a landline, not a cell phone. You need to:  Wear your monitor at all times, except when you are in water:  Do not get the monitor wet.  Take the monitor off when bathing. Do not swim or use a hot tub with it on.  Keep your skin clean. Do not put body lotion or moisturizer on your chest.  Change the electrodes daily or any time they stop sticking to your skin. You might need to use tape to keep them on.  It is possible that your skin under the electrodes could become irritated. To keep this from happening, try to put the electrodes in slightly different places on your chest. However, they must remain in the area under your left breast and in the upper right section of your chest.  Make sure the monitor is safely clipped to your clothing or in a location close to your body that your health care provider recommends.  Press the button to record when you feel symptoms of heart trouble, such as  dizziness, weakness, light-headedness, palpitations, thumping, shortness of breath, unexplained weakness, or a fluttering or racing heart. The monitor is always on and records what happened slightly before you pressed the button, so do not worry about being too late to get good information.  Keep a diary of your activities, such as walking, doing chores, and taking medicine. It is especially important to note what you were doing when you pushed the button to record your symptoms. This will help your health care provider determine what might be contributing to your symptoms. The information stored in your monitor will be reviewed by your health care provider alongside your diary entries.  Send the recorded information as recommended by your health care provider. It is important to understand that it will take some time for your health care provider to process the results.  Change the batteries as recommended by your health care provider. SEEK IMMEDIATE MEDICAL CARE IF:   You have chest pain.  You have extreme difficulty breathing or shortness of breath.  You develop a very fast heartbeat that persists.  You develop dizziness that does not go away.  You faint or constantly feel you are about to faint. Document Released: 11/07/2007 Document Revised: 06/14/2013 Document Reviewed: 07/27/2012 Exit[employer] Patient Information 2015 [employer]. This information is not intended to replace advice given to you by your health care provider. Make sure you discuss any questions you have with your health care provider.  
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
## 2013-12-20 ENCOUNTER — Telehealth: Payer: Self-pay | Admitting: Cardiology

## 2013-12-20 NOTE — Telephone Encounter (Signed)
Pt states she started amlodipine 2.5mg  daily the end of October when she saw Dr Aundra Dubin.  This past Saturday she felt faint and checked her BP-it was 99/46. Pt ate and walked around and felt better. Later  in the day on Saturday  her BP was 104/49.  Pt did not take amlodipine yesterday.  Today her BP ws 131/66 pulse 71, she is feeling better. Pt asking if she should stay off or restart amlodipine.  I will forward to Dr Aundra Dubin for review.

## 2013-12-20 NOTE — Telephone Encounter (Signed)
Stay off amlodipine, check BP daily and call in readings in 1 week.

## 2013-12-20 NOTE — Telephone Encounter (Signed)
Pt.notified

## 2013-12-20 NOTE — Telephone Encounter (Signed)
New message ° ° ° ° ° ° °Talk to the nurse-----pt would not tell me what she wanted °

## 2013-12-24 ENCOUNTER — Encounter: Payer: Self-pay | Admitting: Internal Medicine

## 2013-12-24 ENCOUNTER — Ambulatory Visit (INDEPENDENT_AMBULATORY_CARE_PROVIDER_SITE_OTHER): Payer: Medicare Other | Admitting: Internal Medicine

## 2013-12-24 VITALS — BP 142/78 | HR 80 | Ht 66.5 in | Wt 129.2 lb

## 2013-12-24 DIAGNOSIS — G459 Transient cerebral ischemic attack, unspecified: Secondary | ICD-10-CM

## 2013-12-24 NOTE — Patient Instructions (Signed)
Dates for LINQ Implant---11/23, 12/2,12/4,12/7,12/9,12/11,12/14,12/16,12/28,12/29,12/30,12/31

## 2013-12-26 ENCOUNTER — Encounter: Payer: Self-pay | Admitting: Internal Medicine

## 2013-12-26 NOTE — Progress Notes (Signed)
HPI [name] is referred today by Dr. Aundra Dubin to consider insertion of an ILR. The patient is a very healthy appearing 80 yo woman, with a h/o unexplained syncope in the setting of LBBB. She also has a h/o TIAs which have not been well explained. The patient remains active, exercising regularly and caring for herself. She states that she was told once long ago that she might have had atrial fib but this is not documented. She has preserved LV function.  Allergies  Allergen Reactions  . Doxycycline Hyclate     REACTION: upset stomach  . Nitrofurantoin     REACTION: unspecified  . Sulfamethoxazole-Trimethoprim     REACTION: unspecified  . Sulfonamide Derivatives     REACTION: rash     Current Outpatient Prescriptions  Medication Sig Dispense Refill  . Ascorbic Acid (VITAMIN C PO) Take 1 tablet by mouth daily.     Marland Kitchen aspirin 81 MG tablet Take 81 mg by mouth daily.      Marland Kitchen atorvastatin (LIPITOR) 20 MG tablet Take 1 tablet (20 mg total) by mouth daily. 30 tablet 3  . calcium carbonate 200 MG capsule Take 250 mg by mouth daily.     . Cholecalciferol (VITAMIN D PO) Take 1 tablet by mouth daily.     . clopidogrel (PLAVIX) 75 MG tablet Take 75 mg by mouth daily.    . Cyanocobalamin (VITAMIN B 12 PO) Take 1 tablet by mouth daily.     . fish oil-omega-3 fatty acids 1000 MG capsule Take 1 g by mouth daily.     Marland Kitchen gabapentin (NEURONTIN) 100 MG capsule Take 1 capsule (100 mg total) by mouth as needed. (Patient taking differently: Take 100 mg by mouth daily as needed (pain). ) 90 capsule 3  . glucose blood test strip Use as instructed once a week    . metoprolol tartrate (LOPRESSOR) 25 MG tablet Take 12.5 mg by mouth 2 (two) times daily.    . Multiple Vitamins-Minerals (OCUVITE ADULT 50+ PO) Take 1 tablet by mouth daily.     . ONE TOUCH LANCETS MISC Use daily as directed 200 each 3  . pantoprazole (PROTONIX) 20 MG tablet Take 1 tablet (20 mg total) by mouth every other day. 90 tablet 1  .  VITAMIN E PO Take 1 tablet by mouth daily.     Marland Kitchen zolpidem (AMBIEN) 10 MG tablet Take 1 tablet (10 mg total) by mouth at bedtime as needed. For sleep 90 tablet 1   No current facility-administered medications for this visit.     Past Medical History  Diagnosis Date  . COLONIC POLYPS, ADENOMATOUS 12/26/2009  . DIABETES MELLITUS, TYPE II 08/11/2006  . HYPERLIPIDEMIA 09/09/2006  . ANEMIA, NORMOCYTIC 11/30/2009  . HYPERTENSION 09/09/2006  . CAD, NATIVE VESSEL 04/27/2008    Cardiac catheterization 6/08: EF 55%, 1+ MR, Dx 50%, LAD 50%, dLAD 40%, OM1 50-60% (up to 70%), pOM 30%, pRCA 40%, mRCA 50-60%, pPDA 50-60% (up to 70%).  Medical therapy was recommended  . LEFT BUNDLE BRANCH BLOCK 08/11/2006  . Atrial flutter 04/12/2009  . TIA 09/02/2006    Echocardiogram 6/12: Mild to moderate MR, trace AI, trace TR, PASP 34, bubble study negative for intracardiac shunting, normal EF (greater than 55%)  . HEMORRHOIDS-INTERNAL 12/26/2009  . GERD 02/16/2007  . DIVERTICULOSIS, COLON 08/11/2006  . OSTEOARTHRITIS 08/11/2006  . OSTEOPOROSIS 08/11/2006  . Carotid stenosis     dopplers 6/73: LICA 41-93%  . Hemorrhoids   . Stroke  12-06-13    ROS:   All systems reviewed and negative except as noted in the HPI.   Past Surgical History  Procedure Laterality Date  . Tonsillectomy    . Eye surgery      catarac  . Closed reduction metatarsal fracture      right 5th  . Colonoscopy w/ polypectomy  2001  . Carotid endarterectomy Left 12-05-2010     Family History  Problem Relation Age of Onset  . Heart disease Mother 15  . Kidney disease Mother   . Congestive Heart Failure Father 52  . Breast cancer Sister   . Colon cancer Brother   . Heart disease Brother   . Uterine cancer Sister   . Heart attack Mother      History   Social History  . Marital Status: Widowed    Spouse Name: N/A    Number of Children: 3  . Years of Education: 12   Occupational History  .     Social History Main Topics  .  Smoking status: Never Smoker   . Smokeless tobacco: Never Used  . Alcohol Use: No  . Drug Use: No  . Sexual Activity: Not on file   Other Topics Concern  . Not on file   Social History Narrative   Patient is a widow and lives alone.   Patient has three adult children.   Patient is retired.   Patient has a high school.   Patient is right-handed.   Patient does not drink any caffeine.     BP 142/78 mmHg  Pulse 80  Ht 5' 6.5" (1.689 m)  Wt 129 lb 3.2 oz (58.605 kg)  BMI 20.54 kg/m2  Physical Exam:  Well appearing 80 yo woman, NAD HEENT: Unremarkable Neck:  No JVD, no thyromegally Back:  No CVA tenderness Lungs:  Clear with no wheezes HEART:  Regular rate rhythm, no murmurs, no rubs, no clicks Abd:  soft, positive bowel sounds, no organomegally, no rebound, no guarding Ext:  2 plus pulses, no edema, no cyanosis, no clubbing Skin:  No rashes no nodules Neuro:  CN II through XII intact, motor grossly intact  EKG - NSR with LBBB   Assess/Plan:

## 2013-12-26 NOTE — Assessment & Plan Note (Signed)
The patient has unexplained syncope in the setting of LBBB. The most likely etiology is a Nurse, mental health attack. I have recommended she undergo insertion of an ILR. She will call us if she would like to schedule.

## 2013-12-26 NOTE — Assessment & Plan Note (Signed)
I have recommended that the patient undergo insertion of an ILR. She is considering her options and will call us if she would like to undergo ILR insertion.

## 2013-12-27 ENCOUNTER — Telehealth: Payer: Self-pay | Admitting: Internal Medicine

## 2013-12-27 ENCOUNTER — Other Ambulatory Visit: Payer: Self-pay | Admitting: *Deleted

## 2013-12-27 ENCOUNTER — Ambulatory Visit: Payer: Medicare Other | Admitting: Family Medicine

## 2013-12-27 DIAGNOSIS — R55 Syncope and collapse: Secondary | ICD-10-CM

## 2013-12-27 DIAGNOSIS — I10 Essential (primary) hypertension: Secondary | ICD-10-CM

## 2013-12-27 MED ORDER — ATORVASTATIN CALCIUM 20 MG PO TABS: 20.0000 mg | ORAL_TABLET | Freq: Every day | ORAL | Status: DC

## 2013-12-27 NOTE — Telephone Encounter (Signed)
She would like to be at thte hospital at 9:00am fro her LINQ implant.  I let her know I would call the hospital tomorrow and set this up for her

## 2013-12-27 NOTE — Telephone Encounter (Signed)
New problem   Pt calling to sched an appt for Linq implant. Please call pt.

## 2013-12-27 NOTE — Telephone Encounter (Signed)
Follow Up       Pt calling back to schedule Linq Implant. Pt gave me a date of 01/03/14. Please call pt back and advise.

## 2013-12-29 ENCOUNTER — Other Ambulatory Visit: Payer: Self-pay | Admitting: *Deleted

## 2013-12-29 NOTE — Telephone Encounter (Signed)
Patient aware and will be at the hospital at [facility]

## 2013-12-30 ENCOUNTER — Telehealth: Payer: Self-pay | Admitting: *Deleted

## 2013-12-30 NOTE — Telephone Encounter (Signed)
Pt called to say she checked her BP a short time ago and it was 156/58 HR 72. Pt states she saw Dr Lovena Le 12/24/13 and is scheduled for Linq monitor placement 01/03/14 She has been anxious about having this done although she has been assured by numerous people that it is not a complicated procedure.   She asked that I forward this information to Dr Aundra Dubin.

## 2013-12-30 NOTE — Telephone Encounter (Signed)
PT WAS TALKING TO Desiree Lucy AND HAS FURTHER QUESTIONS, PLS CALL

## 2013-12-30 NOTE — Telephone Encounter (Signed)
Copied from Dr Claris Gladden 12/06/13 office note: HTN: BP is running high. I will have her add amlodipine 2.5 mg daily. She will call if she has any problems with it. We will call her in 2 wks for her BP readings.   12/30/13 Pt has been off amlodipine since 12/20/13. Pt is feeling better, not faint and weak. Pt has not been taking her BP regularly since stopping amlodipine.   See phone note 12/20/13--BP 96/46 on amlodipine 2.5mg  daily, pt felt faint.   Pt will take her BP regularly and call if consistently above 140/90.  I will forward to Dr Aundra Dubin for review.

## 2013-12-30 NOTE — Telephone Encounter (Signed)
If SBP continues > 140, increase amlodipine to 5 mg daily.

## 2013-12-31 ENCOUNTER — Telehealth: Payer: Self-pay | Admitting: Internal Medicine

## 2013-12-31 NOTE — Telephone Encounter (Signed)
Will forward to precert

## 2013-12-31 NOTE — Telephone Encounter (Signed)
MRN:  [id number]   Description: 80 year old female  Provider: Larey Dresser, MD  Department: [facility], RN at 12/20/2013 5:18 PM     Status: Signed       Expand All Collapse All   Pt notified            Larey Dresser, MD at 12/20/2013 4:49 PM     Status: Signed       Expand All Collapse All   Stay off amlodipine, check BP daily and call in readings in 1 week.             Katrine Coho, RN at 12/20/2013 3:54 PM     Status: Signed       Expand All Collapse All   Pt states she started amlodipine 2.5mg  daily the end of October when she saw Dr Aundra Dubin.  This past Saturday she felt faint and checked her BP-it was 99/46. Pt ate and walked around and felt better. Later in the day on Saturday her BP was 104/49.  Pt did not take amlodipine yesterday.  Today her BP ws 131/66 pulse 71, she is feeling better. Pt asking if she should stay off or restart amlodipine.  I will forward to Dr Aundra Dubin for review.       MRN:  [id number]   Description: 80 year old female  Provider: Larey Dresser, MD  Department: [facility] [place], RN at 12/20/2013 5:18 PM     Status: Signed        Pt notified            Larey Dresser, MD at 12/20/2013 4:49 PM     Status: Signed        Stay off amlodipine, check BP daily and call in readings in 1 week.             Katrine Coho, RN at 12/20/2013 3:54 PM     Status: Signed        Pt states she started amlodipine 2.5mg  daily the end of October when she saw Dr Aundra Dubin.  This past Saturday she felt faint and checked her BP-it was 99/46. Pt ate and walked around and felt better. Later in the day on Saturday her BP was 104/49.  Pt did not take amlodipine yesterday.  Today her BP ws 131/66 pulse 71, she is feeling better. Pt asking if she should stay off or restart amlodipine.   I will forward to Dr Aundra Dubin for review.

## 2013-12-31 NOTE — Telephone Encounter (Signed)
New Msg   Patient calling to see if her insurance has approved her surgery for Monday with Dr. Lovena Le.  Please contact at [phone]. It is ok to leave msg.

## 2013-12-31 NOTE — Telephone Encounter (Signed)
Pt has not been taking amlodipine since 12/20/13-she called that day and said her BP was 99/46 several days after she started amlodipine 2.5mg  daily and she felt faint.

## 2013-12-31 NOTE — Telephone Encounter (Signed)
Called patient and left detailed information.  Approval # [id number] for her loop recorder implant on Monday with Dr. Lovena Le.

## 2014-01-03 ENCOUNTER — Ambulatory Visit (HOSPITAL_COMMUNITY)
Admission: RE | Admit: 2014-01-03 | Discharge: 2014-01-03 | Disposition: A | Discharge status: Home / Self Care | Payer: Medicare Other | Source: Ambulatory Visit | Attending: Internal Medicine | Admitting: Internal Medicine

## 2014-01-03 ENCOUNTER — Ambulatory Visit: Payer: Medicare Other | Admitting: Family Medicine

## 2014-01-03 ENCOUNTER — Encounter (HOSPITAL_COMMUNITY)
Admission: RE | Disposition: A | Discharge status: Home / Self Care | Payer: Self-pay | Source: Ambulatory Visit | Attending: Internal Medicine

## 2014-01-03 DIAGNOSIS — Z7902 Long term (current) use of antithrombotics/antiplatelets: Secondary | ICD-10-CM | POA: Diagnosis not present

## 2014-01-03 DIAGNOSIS — Z7982 Long term (current) use of aspirin: Secondary | ICD-10-CM | POA: Diagnosis not present

## 2014-01-03 DIAGNOSIS — R55 Syncope and collapse: Secondary | ICD-10-CM

## 2014-01-03 DIAGNOSIS — Z8673 Personal history of transient ischemic attack (TIA), and cerebral infarction without residual deficits: Secondary | ICD-10-CM | POA: Diagnosis not present

## 2014-01-03 DIAGNOSIS — I447 Left bundle-branch block, unspecified: Secondary | ICD-10-CM | POA: Insufficient documentation

## 2014-01-03 DIAGNOSIS — Z79899 Other long term (current) drug therapy: Secondary | ICD-10-CM | POA: Diagnosis not present

## 2014-01-03 HISTORY — PX: LOOP RECORDER IMPLANT: SHX5477

## 2014-01-03 SURGERY — LOOP RECORDER IMPLANT
Anesthesia: LOCAL

## 2014-01-03 MED ORDER — LIDOCAINE-EPINEPHRINE 1 %-1:100000 IJ SOLN
INTRAMUSCULAR | Status: AC
  Filled 2014-01-03: qty 1

## 2014-01-03 NOTE — Discharge Instructions (Signed)
°  See attached  Loop recorder instructions

## 2014-01-03 NOTE — H&P (View-Only) (Signed)
HPI [name] is referred today by Dr. Aundra Dubin to consider insertion of an ILR. The patient is a very healthy appearing 80 yo woman, with a h/o unexplained syncope in the setting of LBBB. She also has a h/o TIAs which have not been well explained. The patient remains active, exercising regularly and caring for herself. She states that she was told once long ago that she might have had atrial fib but this is not documented. She has preserved LV function.  Allergies  Allergen Reactions  . Doxycycline Hyclate     REACTION: upset stomach  . Nitrofurantoin     REACTION: unspecified  . Sulfamethoxazole-Trimethoprim     REACTION: unspecified  . Sulfonamide Derivatives     REACTION: rash     Current Outpatient Prescriptions  Medication Sig Dispense Refill  . Ascorbic Acid (VITAMIN C PO) Take 1 tablet by mouth daily.     Marland Kitchen aspirin 81 MG tablet Take 81 mg by mouth daily.      Marland Kitchen atorvastatin (LIPITOR) 20 MG tablet Take 1 tablet (20 mg total) by mouth daily. 30 tablet 3  . calcium carbonate 200 MG capsule Take 250 mg by mouth daily.     . Cholecalciferol (VITAMIN D PO) Take 1 tablet by mouth daily.     . clopidogrel (PLAVIX) 75 MG tablet Take 75 mg by mouth daily.    . Cyanocobalamin (VITAMIN B 12 PO) Take 1 tablet by mouth daily.     . fish oil-omega-3 fatty acids 1000 MG capsule Take 1 g by mouth daily.     Marland Kitchen gabapentin (NEURONTIN) 100 MG capsule Take 1 capsule (100 mg total) by mouth as needed. (Patient taking differently: Take 100 mg by mouth daily as needed (pain). ) 90 capsule 3  . glucose blood test strip Use as instructed once a week    . metoprolol tartrate (LOPRESSOR) 25 MG tablet Take 12.5 mg by mouth 2 (two) times daily.    . Multiple Vitamins-Minerals (OCUVITE ADULT 50+ PO) Take 1 tablet by mouth daily.     . ONE TOUCH LANCETS MISC Use daily as directed 200 each 3  . pantoprazole (PROTONIX) 20 MG tablet Take 1 tablet (20 mg total) by mouth every other day. 90 tablet 1  .  VITAMIN E PO Take 1 tablet by mouth daily.     Marland Kitchen zolpidem (AMBIEN) 10 MG tablet Take 1 tablet (10 mg total) by mouth at bedtime as needed. For sleep 90 tablet 1   No current facility-administered medications for this visit.     Past Medical History  Diagnosis Date  . COLONIC POLYPS, ADENOMATOUS 12/26/2009  . DIABETES MELLITUS, TYPE II 08/11/2006  . HYPERLIPIDEMIA 09/09/2006  . ANEMIA, NORMOCYTIC 11/30/2009  . HYPERTENSION 09/09/2006  . CAD, NATIVE VESSEL 04/27/2008    Cardiac catheterization 6/08: EF 55%, 1+ MR, Dx 50%, LAD 50%, dLAD 40%, OM1 50-60% (up to 70%), pOM 30%, pRCA 40%, mRCA 50-60%, pPDA 50-60% (up to 70%).  Medical therapy was recommended  . LEFT BUNDLE BRANCH BLOCK 08/11/2006  . Atrial flutter 04/12/2009  . TIA 09/02/2006    Echocardiogram 6/12: Mild to moderate MR, trace AI, trace TR, PASP 34, bubble study negative for intracardiac shunting, normal EF (greater than 55%)  . HEMORRHOIDS-INTERNAL 12/26/2009  . GERD 02/16/2007  . DIVERTICULOSIS, COLON 08/11/2006  . OSTEOARTHRITIS 08/11/2006  . OSTEOPOROSIS 08/11/2006  . Carotid stenosis     dopplers 4/09: LICA 81-19%  . Hemorrhoids   . Stroke  12-06-13    ROS:   All systems reviewed and negative except as noted in the HPI.   Past Surgical History  Procedure Laterality Date  . Tonsillectomy    . Eye surgery      catarac  . Closed reduction metatarsal fracture      right 5th  . Colonoscopy w/ polypectomy  2001  . Carotid endarterectomy Left 12-05-2010     Family History  Problem Relation Age of Onset  . Heart disease Mother 40  . Kidney disease Mother   . Congestive Heart Failure Father 81  . Breast cancer Sister   . Colon cancer Brother   . Heart disease Brother   . Uterine cancer Sister   . Heart attack Mother      History   Social History  . Marital Status: Widowed    Spouse Name: N/A    Number of Children: 3  . Years of Education: 12   Occupational History  .     Social History Main Topics  .  Smoking status: Never Smoker   . Smokeless tobacco: Never Used  . Alcohol Use: No  . Drug Use: No  . Sexual Activity: Not on file   Other Topics Concern  . Not on file   Social History Narrative   Patient is a widow and lives alone.   Patient has three adult children.   Patient is retired.   Patient has a high school.   Patient is right-handed.   Patient does not drink any caffeine.     BP 142/78 mmHg  Pulse 80  Ht 5' 6.5" (1.689 m)  Wt 129 lb 3.2 oz (58.605 kg)  BMI 20.54 kg/m2  Physical Exam:  Well appearing 80 yo woman, NAD HEENT: Unremarkable Neck:  No JVD, no thyromegally Back:  No CVA tenderness Lungs:  Clear with no wheezes HEART:  Regular rate rhythm, no murmurs, no rubs, no clicks Abd:  soft, positive bowel sounds, no organomegally, no rebound, no guarding Ext:  2 plus pulses, no edema, no cyanosis, no clubbing Skin:  No rashes no nodules Neuro:  CN II through XII intact, motor grossly intact  EKG - NSR with LBBB   Assess/Plan:

## 2014-01-03 NOTE — CV Procedure (Addendum)
EP Procedure Note  Procedure: Insertion of an ILR  Indication: Unexplained syncope  Pre-procedure Diagnosis: Unexplained syncope  Post-procedure Diagnosis: Same as preprocedure diagnosis  Description of the procedure: After informed consent was obtained, the patient was prepped and draped in a sterile fashion. 20 cc of lidocaine was infiltrated and a one cm stab incision made. The Medtronic ILR, serial [id number] was inserted into the subcutaneous space. R waves measured 0.8 mV. Benzoin and steristrips were painted on the skin and the patient recovered in the usual manner.   Complications: none immediately.  Conclusion: successful insertion of a Medtronic ILR in a patient with unexplained syncope.  Mikle Bosworth.

## 2014-01-03 NOTE — Interval H&P Note (Signed)
History and Physical Interval Note:  01/03/2014 9:22 AM  [name]  has presented today for surgery, with the diagnosis of snycope  The various methods of treatment have been discussed with the patient and family. After consideration of risks, benefits and other options for treatment, the patient has consented to  Procedure(s): LOOP RECORDER IMPLANT (N/A) as a surgical intervention .  The patient's history has been reviewed, patient examined, no change in status, stable for surgery.  I have reviewed the patient's chart and labs.  Questions were answered to the patient's satisfaction.     Mikle Bosworth.D.

## 2014-01-10 ENCOUNTER — Telehealth: Payer: Self-pay | Admitting: Family Medicine

## 2014-01-10 MED ORDER — ZOLPIDEM TARTRATE 10 MG PO TABS: 10.0000 mg | ORAL_TABLET | Freq: Every evening | ORAL | Status: DC | PRN

## 2014-01-10 NOTE — Telephone Encounter (Signed)
Needs her refill sent to [facility] for her Ambien 10mg . Would like refills on the Rx if possible.

## 2014-01-10 NOTE — Telephone Encounter (Signed)
Refill OK

## 2014-01-10 NOTE — Telephone Encounter (Signed)
Last visit 10/22/13 [facility] refill 06/30/13 #90 1 refill

## 2014-01-10 NOTE — Telephone Encounter (Signed)
RX faxed to mail order 

## 2014-01-13 NOTE — Telephone Encounter (Signed)
I called pt to see how her BP had been recently. Pt states she has not checked her BP recently, she has been doing very well  and tends not to check her BP when she feels well.  Pt has appt with Dr Aundra Dubin 01/20/14.  Pt will try to take and record her BP regularly and bring to her appt with Dr Aundra Dubin 01/20/14.

## 2014-01-17 ENCOUNTER — Ambulatory Visit (INDEPENDENT_AMBULATORY_CARE_PROVIDER_SITE_OTHER): Payer: Medicare Other | Admitting: Family Medicine

## 2014-01-17 ENCOUNTER — Encounter: Payer: Self-pay | Admitting: Family Medicine

## 2014-01-17 VITALS — BP 132/76 | HR 65 | Temp 97.3°F | Wt 128.0 lb

## 2014-01-17 DIAGNOSIS — E119 Type 2 diabetes mellitus without complications: Secondary | ICD-10-CM

## 2014-01-17 DIAGNOSIS — J3089 Other allergic rhinitis: Secondary | ICD-10-CM

## 2014-01-17 DIAGNOSIS — R55 Syncope and collapse: Secondary | ICD-10-CM

## 2014-01-17 DIAGNOSIS — G4700 Insomnia, unspecified: Secondary | ICD-10-CM

## 2014-01-17 DIAGNOSIS — I10 Essential (primary) hypertension: Secondary | ICD-10-CM

## 2014-01-17 DIAGNOSIS — J309 Allergic rhinitis, unspecified: Secondary | ICD-10-CM

## 2014-01-17 DIAGNOSIS — F5104 Psychophysiologic insomnia: Secondary | ICD-10-CM

## 2014-01-17 NOTE — Patient Instructions (Signed)
May take over-the-counter plain Claritin or plain Allegra for allergy symptoms Could also consider over-the-counter Flonase for allergic nasal symptoms

## 2014-01-17 NOTE — Progress Notes (Signed)
Pre visit review using our clinic review tool, if applicable. No additional management support is needed unless otherwise documented below in the visit note. 

## 2014-01-17 NOTE — Progress Notes (Signed)
Subjective:    Patient ID: [name], female    DOB: 05-Nov-1920, 80 y.o.   MRN: [id number]  HPI   Patient is a for medical follow-up.  She has history of recurrent syncope and also question of previous TIAs most recently in October. She had recent carotid Dopplers which were unchanged. She had a loop recorder implanted recently. No syncope over the past few months. She has underlying left bundle branch block.  Blood pressure been stable mostly 120s status over 70 diastolic. She remains on low-dose metoprolol. No chest pains. No confusion.  Increased allergic symptoms. She has frequent sneezing and nasal congestion and knows  that dust is a trigger. She is currently taking nothing for the symptoms. She has chronic insomnia and has for years been on Ambien. She has had no success with lower dose 5 mg.  History of type 2 diabetes controlled off medication. Last A1c 6.8% and she's been stable in this range for several years. She needs new home monitor. No symptoms of polyuria or polydipsia.  Past Medical History  Diagnosis Date  . COLONIC POLYPS, ADENOMATOUS 12/26/2009  . DIABETES MELLITUS, TYPE II 08/11/2006  . HYPERLIPIDEMIA 09/09/2006  . ANEMIA, NORMOCYTIC 11/30/2009  . HYPERTENSION 09/09/2006  . CAD, NATIVE VESSEL 04/27/2008    Cardiac catheterization 6/08: EF 55%, 1+ MR, Dx 50%, LAD 50%, dLAD 40%, OM1 50-60% (up to 70%), pOM 30%, pRCA 40%, mRCA 50-60%, pPDA 50-60% (up to 70%).  Medical therapy was recommended  . LEFT BUNDLE BRANCH BLOCK 08/11/2006  . Atrial flutter 04/12/2009  . TIA 09/02/2006    Echocardiogram 6/12: Mild to moderate MR, trace AI, trace TR, PASP 34, bubble study negative for intracardiac shunting, normal EF (greater than 55%)  . HEMORRHOIDS-INTERNAL 12/26/2009  . GERD 02/16/2007  . DIVERTICULOSIS, COLON 08/11/2006  . OSTEOARTHRITIS 08/11/2006  . OSTEOPOROSIS 08/11/2006  . Carotid stenosis     dopplers 7/62: LICA 83-15%  . Hemorrhoids   . Stroke 12-06-13   Past  Surgical History  Procedure Laterality Date  . Tonsillectomy    . Eye surgery      catarac  . Closed reduction metatarsal fracture      right 5th  . Colonoscopy w/ polypectomy  2001  . Carotid endarterectomy Left 12-05-2010    reports that she has never smoked. She has never used smokeless tobacco. She reports that she does not drink alcohol or use illicit drugs. family history includes Breast cancer in her sister; Colon cancer in her brother; Congestive Heart Failure (age of onset: 35) in her father; Heart attack in her mother; Heart disease in her brother; Heart disease (age of onset: 17) in her mother; Kidney disease in her mother; Uterine cancer in her sister. Allergies  Allergen Reactions  . Doxycycline Hyclate     REACTION: upset stomach  . Nitrofurantoin     REACTION: unspecified  . Sulfamethoxazole-Trimethoprim     REACTION: unspecified  . Sulfonamide Derivatives     REACTION: rash      Review of Systems  Constitutional: Negative for fatigue.  Eyes: Negative for visual disturbance.  Respiratory: Negative for cough, chest tightness, shortness of breath and wheezing.   Cardiovascular: Negative for chest pain, palpitations and leg swelling.  Endocrine: Negative for polydipsia and polyuria.  Neurological: Negative for dizziness, seizures, syncope, weakness, light-headedness and headaches.       Objective:   Physical Exam  Constitutional: She is oriented to person, place, and time. She appears well-developed and well-nourished.  HENT:  Mouth/Throat: Oropharynx is clear and moist.  Neck: Neck supple.  Cardiovascular: Normal rate and regular rhythm.   Pulmonary/Chest: Effort normal and breath sounds normal. No respiratory distress. She has no wheezes. She has no rales.  Musculoskeletal: She exhibits no edema.  Neurological: She is alert and oriented to person, place, and time. No cranial nerve deficit.          Assessment & Plan:  #1 type 2 diabetes. History of  good control. We'll plan to recheck A1c at follow-up in 3 months. New home glucose monitor given with instructions #2 chronic insomnia. We have tried in the past tapering back her Ambien without success. Sleep hygiene discussed #3 history of recurrent syncope. Loop recorder in place. Followed by cardiology.  No recent syncopal episodes.    #4 hypertension. Stable at goal. #5 allergic rhinitis. Try over-the-counter plain Claritin or Allegra. Avoid decongestants obviously at her age.  Should also be safe to try Flonase or Nasacort

## 2014-01-20 ENCOUNTER — Ambulatory Visit (INDEPENDENT_AMBULATORY_CARE_PROVIDER_SITE_OTHER): Payer: Medicare Other | Admitting: *Deleted

## 2014-01-20 ENCOUNTER — Ambulatory Visit (INDEPENDENT_AMBULATORY_CARE_PROVIDER_SITE_OTHER): Payer: Medicare Other | Admitting: Cardiology

## 2014-01-20 ENCOUNTER — Encounter: Payer: Self-pay | Admitting: Cardiology

## 2014-01-20 VITALS — BP 110/62 | HR 64 | Ht 66.5 in | Wt 126.0 lb

## 2014-01-20 DIAGNOSIS — I2510 Atherosclerotic heart disease of native coronary artery without angina pectoris: Secondary | ICD-10-CM

## 2014-01-20 DIAGNOSIS — E785 Hyperlipidemia, unspecified: Secondary | ICD-10-CM

## 2014-01-20 DIAGNOSIS — R55 Syncope and collapse: Secondary | ICD-10-CM

## 2014-01-20 DIAGNOSIS — I4892 Unspecified atrial flutter: Secondary | ICD-10-CM

## 2014-01-20 DIAGNOSIS — I639 Cerebral infarction, unspecified: Secondary | ICD-10-CM

## 2014-01-20 DIAGNOSIS — E119 Type 2 diabetes mellitus without complications: Secondary | ICD-10-CM

## 2014-01-20 LAB — MDC_IDC_ENUM_SESS_TYPE_INCLINIC

## 2014-01-20 LAB — TSH: TSH: 3.38 u[IU]/mL (ref 0.35–4.50)

## 2014-01-20 NOTE — Progress Notes (Signed)
LINQ check in clinic. (Add-on). Battery status good. Normal device function. No episodes recorded. Rwave measurement 0.59-0.62mV. Monthly summary reports and ROV in 12 mths w/GT.

## 2014-01-20 NOTE — Patient Instructions (Addendum)
Your physician wants you to follow-up in: 6 months with Dr. Kendall Flack will receive a reminder letter in the mail two months in advance. If you don't receive a letter, please call our office to schedule the follow-up appointment.   Your physician recommends that you return for lab work today: TSH

## 2014-01-22 NOTE — Progress Notes (Signed)
Patient ID: [name], female   DOB: 1920-12-22, 80 y.o.   MRN: [id number] PCP: Dr. Elease Hashimoto  80 yo with history of carotid stenosis s/p CEA, nonobstructive CAD, syncope, and recent TIAs presents for cardiology followup.  She had atrial fibrillation documented years ago but has not had a documented recurrence.  She is not on anticoagulation due to concern for profuse hemorrhoidal bleeding.  Last Cardiolite in 11/14 showed no ischemia or infarction.  In 8/15, she had a syncopal event while standing in the kitchen.  She was not unconscious for long.  She then had 2 episodes concerning for TIAs.  Both times, she lost a portion of her left visual field transiently.  MRI after the 2nd episode showed no evidence for CVA.  She was thought to have a TIA.  She had an ophthalmology appointment and was told that there appears to be nothing wrong with her eyes themselves. Linq monitor was placed on 11/23 to look for atrial fibrillation.  She has had no further TIA-like symptoms.  She started amlodipine 2.5 mg daily but stopped it due to low blood pressure.  Currently, SBP running 110s-140s.   She does not get short of breath walking on flat ground.  No orthopnea/PND.  No lightheadedness or tachypalpitations.  No further syncope.  She does get some dyspnea when trying to vacuum.    Linq monitor was interrogated today, no atrial fibrillation noted.   ECG: NSR, IVCD 128 msec  Labs (5/15): LDL 41, HDL 46.5 Labs (10/15): K 5.1, creatinine 0.95, HCT 34.9  PMH: 1. H/o TIA: Possible TIA in 10/15 with visual field cut.  MRI (10/15) with no CVA. Linq monitor placed.  2. Carotid stenosis: Left CEA in 10/12.  Carotid dopplers (10/15) with 1-39% bilateral ICA stenosis.   3. Atrial fibrillation: Paroxysmal, only prior documented episode was years ago.  She was not started on anticoagulation back then due to hemorrhoidal bleeding, apparently profuse.  Event monitor (3/14) showed only NSR.  4. Hemorrhoids s/p banding.   5. HTN 6. Type II diabetes 7. Hyperlipidemia 8. LBBB 9. CAD: Nonobstructive.  LHC (6/08) with EF 55%, 1+ MR, Dx 50%, LAD 50%, dLAD 40%, OM1 50-60% (up to 70%), pOM 30%, pRCA 40%, mRCA 50-60%, pPDA 50-60% (up to 70%).  Adenosine Cardiolite (11/14) with EF 67%, no ischemia or infarction.  10. Syncope: 8/15. EEG (5/15) was unremarkable. 11. Echo (4/14) with EF 60-65%, mild MR, aortic sclerosis without stenosis.   SH: Lives alone in [place], 1 son in [place] and 2 other kids in the [place].  No smoking or ETOH.   FH: No premature CAD  ROS: All systems reviewed and negative except as per HPI.   Current Outpatient Prescriptions  Medication Sig Dispense Refill  . Ascorbic Acid (VITAMIN C PO) Take 1 tablet by mouth daily.     Marland Kitchen aspirin 81 MG tablet Take 81 mg by mouth daily.      Marland Kitchen atorvastatin (LIPITOR) 20 MG tablet Take 1 tablet (20 mg total) by mouth daily. 90 tablet 1  . calcium carbonate 200 MG capsule Take 250 mg by mouth daily.     . Cholecalciferol (VITAMIN D PO) Take 1 tablet by mouth daily.     . clopidogrel (PLAVIX) 75 MG tablet Take 75 mg by mouth daily.    . Cyanocobalamin (VITAMIN B 12 PO) Take 1 tablet by mouth daily.     . fish oil-omega-3 fatty acids 1000 MG capsule Take 1 g by mouth daily.     Marland Kitchen  gabapentin (NEURONTIN) 100 MG capsule Take 1 capsule (100 mg total) by mouth as needed. (Patient taking differently: Take 100 mg by mouth daily as needed (pain). ) 90 capsule 3  . glucose blood test strip Use as instructed once a week    . metoprolol tartrate (LOPRESSOR) 25 MG tablet Take 12.5 mg by mouth 2 (two) times daily.    . Multiple Vitamins-Minerals (MULTIVITAMIN WITH MINERALS) tablet Take 1 tablet by mouth daily.    . Multiple Vitamins-Minerals (OCUVITE ADULT 50+ PO) Take 1 tablet by mouth daily.     . ONE TOUCH LANCETS MISC Use daily as directed 200 each 3  . pantoprazole (PROTONIX) 20 MG tablet Take 1 tablet (20 mg total) by mouth every other day. 90 tablet 1  .  VITAMIN E PO Take 1 tablet by mouth daily.     Marland Kitchen zolpidem (AMBIEN) 10 MG tablet Take 1 tablet (10 mg total) by mouth at bedtime as needed. For sleep 90 tablet 1   No current facility-administered medications for this visit.    BP 110/62 mmHg  Pulse 64  Ht 5' 6.5" (1.689 m)  Wt 126 lb (57.153 kg)  BMI 20.03 kg/m2 General: NAD Neck: No JVD, no thyromegaly or thyroid nodule.  Lungs: Clear to auscultation bilaterally with normal respiratory effort. CV: Nondisplaced PMI.  Heart regular S1/S2, no S3/S4, no murmur.  No peripheral edema.  No carotid bruit.  Normal pedal pulses.  Abdomen: Soft, nontender, no hepatosplenomegaly, no distention.  Skin: Intact without lesions or rashes.  Neurologic: Alert and oriented x 3.  Psych: Normal affect. Extremities: No clubbing or cyanosis.  HEENT: Normal.   Assessment/Plan: 1. Possible TIAs: Patient had 2 episodes where she lost a portion of her left visual field (?homonymous hemianopsia).  Both were transient, and head MRI in 10/15 showed no CVA.  She is currently on ASA and Plavix.  No atrial fibrillation has been identified.  I am concerned that she does indeed have paroxysmal atrial fibrillation (apparently there was an episode detected years ago), and in that case, should be anticoagulated.  I do not, however, want to commit her to anticoagulation unless there is clearly documented atrial fibrillation. She now has a Linq monitor.  If atrial fibrillation is found, I will have her stop ASA and Plavix and go on Eliquis 2.5 mg bid (reduced dose due to age/weight).  She has had a history of hemorrhoidal bleeding in the past that has required banding but none recently.   2. HTN: BP seems to be ok on her home readings.  3. Hyperlipidemia:   Continue atorvastatin, check lipids/LFTs in 1 month.  Goal LDL < 70 with known vascular disease.  4. Syncope: Episode in 8/15 without prodrome. There has been no recurrence.  She now has a Linq monitor.   5. Carotid  stenosis: s/p CEA, followed by [facility].  6. CAD: Moderate, nonobstructive disease.  No ischemic symptoms.  Continue ASA/Plavix, statin.   Loralie Champagne 01/22/2014

## 2014-02-02 ENCOUNTER — Ambulatory Visit (INDEPENDENT_AMBULATORY_CARE_PROVIDER_SITE_OTHER): Payer: Medicare Other | Admitting: *Deleted

## 2014-02-02 DIAGNOSIS — G459 Transient cerebral ischemic attack, unspecified: Secondary | ICD-10-CM

## 2014-02-02 LAB — MDC_IDC_ENUM_SESS_TYPE_REMOTE

## 2014-02-07 NOTE — Progress Notes (Signed)
Loop recorder 

## 2014-03-04 ENCOUNTER — Ambulatory Visit (INDEPENDENT_AMBULATORY_CARE_PROVIDER_SITE_OTHER): Payer: Self-pay | Admitting: *Deleted

## 2014-03-04 DIAGNOSIS — G459 Transient cerebral ischemic attack, unspecified: Secondary | ICD-10-CM

## 2014-03-10 NOTE — Progress Notes (Signed)
Loop recorder 

## 2014-03-14 ENCOUNTER — Encounter: Payer: Self-pay | Admitting: Internal Medicine

## 2014-03-25 ENCOUNTER — Telehealth: Payer: Self-pay | Admitting: Family Medicine

## 2014-03-25 NOTE — Telephone Encounter (Signed)
Pt glucometer has stop working properly and pt would like free meter

## 2014-03-25 NOTE — Telephone Encounter (Signed)
Pt aware that glucometer is ready for pick up

## 2014-03-28 LAB — MDC_IDC_ENUM_SESS_TYPE_REMOTE

## 2014-04-04 ENCOUNTER — Ambulatory Visit (INDEPENDENT_AMBULATORY_CARE_PROVIDER_SITE_OTHER): Payer: Medicare Other | Admitting: *Deleted

## 2014-04-04 DIAGNOSIS — G459 Transient cerebral ischemic attack, unspecified: Secondary | ICD-10-CM

## 2014-04-05 ENCOUNTER — Encounter: Payer: Self-pay | Admitting: Internal Medicine

## 2014-04-05 NOTE — Progress Notes (Signed)
Loop recorder 

## 2014-04-19 LAB — MDC_IDC_ENUM_SESS_TYPE_REMOTE

## 2014-04-21 ENCOUNTER — Ambulatory Visit (INDEPENDENT_AMBULATORY_CARE_PROVIDER_SITE_OTHER): Payer: Medicare Other | Admitting: Family Medicine

## 2014-04-21 ENCOUNTER — Encounter: Payer: Self-pay | Admitting: Family Medicine

## 2014-04-21 VITALS — BP 128/66 | HR 70 | Temp 97.5°F | Wt 128.0 lb

## 2014-04-21 DIAGNOSIS — E785 Hyperlipidemia, unspecified: Secondary | ICD-10-CM

## 2014-04-21 DIAGNOSIS — R296 Repeated falls: Secondary | ICD-10-CM

## 2014-04-21 DIAGNOSIS — I10 Essential (primary) hypertension: Secondary | ICD-10-CM

## 2014-04-21 DIAGNOSIS — R42 Dizziness and giddiness: Secondary | ICD-10-CM

## 2014-04-21 DIAGNOSIS — Z9181 History of falling: Secondary | ICD-10-CM

## 2014-04-21 DIAGNOSIS — E119 Type 2 diabetes mellitus without complications: Secondary | ICD-10-CM

## 2014-04-21 LAB — BASIC METABOLIC PANEL
BUN: 27 mg/dL — ABNORMAL HIGH (ref 6–23)
CO2: 29 mEq/L (ref 19–32)
Calcium: 9.6 mg/dL (ref 8.4–10.5)
Chloride: 104 mEq/L (ref 96–112)
Creatinine, Ser: 1.08 mg/dL (ref 0.40–1.20)
GFR: 50.44 mL/min — ABNORMAL LOW (ref >60.00)
Glucose, Bld: 131 mg/dL — ABNORMAL HIGH (ref 70–99)
Potassium: 4.8 mEq/L (ref 3.5–5.1)
SODIUM: 137 meq/L (ref 135–145)

## 2014-04-21 LAB — LIPID PANEL
Cholesterol: 116 mg/dL (ref 0–200)
HDL: 47.1 mg/dL (ref >39.00)
LDL CALC: 47 mg/dL (ref 0–99)
NonHDL: 68.9
TRIGLYCERIDES: 111 mg/dL (ref 0.0–149.0)
Total CHOL/HDL Ratio: 2
VLDL: 22.2 mg/dL (ref 0.0–40.0)

## 2014-04-21 LAB — HEPATIC FUNCTION PANEL
ALK PHOS: 32 U/L — AB (ref 39–117)
ALT: 21 U/L (ref 0–35)
AST: 24 U/L (ref 0–37)
Albumin: 4.2 g/dL (ref 3.5–5.2)
BILIRUBIN DIRECT: 0.2 mg/dL (ref 0.0–0.3)
BILIRUBIN TOTAL: 0.5 mg/dL (ref 0.2–1.2)
TOTAL PROTEIN: 7.7 g/dL (ref 6.0–8.3)

## 2014-04-21 LAB — HEMOGLOBIN A1C: Hgb A1c MFr Bld: 6.9 % — ABNORMAL HIGH (ref 4.6–6.5)

## 2014-04-21 NOTE — Progress Notes (Signed)
Subjective:    Patient ID: [name], female    DOB: 06-Jul-1920, 81 y.o.   MRN: [id number]  HPI Patient seen for medical follow-up. She has multiple chronic problems including type 2 diabetes, history of cerebrovascular disease with TIAs, osteoarthritis, GERD, hypertension, dyslipidemia, transient atrial fibrillation but not documented on recent event monitor.  She has long history of vertigo. Symptoms are intermittent. She feels off balance infrequently walks "to the left ". She's not had any actual falls recently. No headaches. She had some type of physical therapy years ago but is not sure if this help much.  Type 2 diabetes. Not treated with medication. Last A1c 6.8%. Recently ran out of test strips. Has not monitored sugars regularly over the past few weeks. No polyuria or polydipsia.  Hyperlipidemia treated with atorvastatin. Goal LDL less than 70. Due for lipids. Recent TSH per cardiology normal  Past Medical History  Diagnosis Date  . COLONIC POLYPS, ADENOMATOUS 12/26/2009  . DIABETES MELLITUS, TYPE II 08/11/2006  . HYPERLIPIDEMIA 09/09/2006  . ANEMIA, NORMOCYTIC 11/30/2009  . HYPERTENSION 09/09/2006  . CAD, NATIVE VESSEL 04/27/2008    Cardiac catheterization 6/08: EF 55%, 1+ MR, Dx 50%, LAD 50%, dLAD 40%, OM1 50-60% (up to 70%), pOM 30%, pRCA 40%, mRCA 50-60%, pPDA 50-60% (up to 70%).  Medical therapy was recommended  . LEFT BUNDLE BRANCH BLOCK 08/11/2006  . Atrial flutter 04/12/2009  . TIA 09/02/2006    Echocardiogram 6/12: Mild to moderate MR, trace AI, trace TR, PASP 34, bubble study negative for intracardiac shunting, normal EF (greater than 55%)  . HEMORRHOIDS-INTERNAL 12/26/2009  . GERD 02/16/2007  . DIVERTICULOSIS, COLON 08/11/2006  . OSTEOARTHRITIS 08/11/2006  . OSTEOPOROSIS 08/11/2006  . Carotid stenosis     dopplers 9/03: LICA 00-92%  . Hemorrhoids   . Stroke 12-06-13   Past Surgical History  Procedure Laterality Date  . Tonsillectomy    . Eye surgery      catarac   . Closed reduction metatarsal fracture      right 5th  . Colonoscopy w/ polypectomy  2001  . Carotid endarterectomy Left 12-05-2010  . Loop recorder implant N/A 01/03/2014    Procedure: LOOP RECORDER IMPLANT;  Surgeon: Evans Lance, MD;  Location[facility] LAB;  Service: Cardiovascular;  Laterality: N/A;    reports that she has never smoked. She has never used smokeless tobacco. She reports that she does not drink alcohol or use illicit drugs. family history includes Breast cancer in her sister; Colon cancer in her brother; Congestive Heart Failure (age of onset: 19) in her father; Heart attack in her mother; Heart disease in her brother; Heart disease (age of onset: 23) in her mother; Kidney disease in her mother; Uterine cancer in her sister. Allergies  Allergen Reactions  . Doxycycline Hyclate     REACTION: upset stomach  . Nitrofurantoin     REACTION: unspecified  . Sulfamethoxazole-Trimethoprim     REACTION: unspecified  . Sulfonamide Derivatives     REACTION: rash      Review of Systems  Constitutional: Positive for fatigue.  Eyes: Negative for visual disturbance.  Respiratory: Negative for cough, chest tightness, shortness of breath and wheezing.   Cardiovascular: Negative for chest pain, palpitations and leg swelling.  Gastrointestinal: Negative for abdominal pain.  Endocrine: Negative for polydipsia and polyuria.  Neurological: Positive for dizziness. Negative for seizures, syncope, weakness, light-headedness and headaches.       Objective:   Physical Exam  Constitutional: She is oriented  to person, place, and time. She appears well-developed and well-nourished.  Neck: Neck supple. No thyromegaly present.  Cardiovascular: Normal rate and regular rhythm.   Pulmonary/Chest: Effort normal and breath sounds normal. No respiratory distress. She has no wheezes. She has no rales.  Musculoskeletal: She exhibits no edema.  Neurological: She is alert and oriented to person,  place, and time. No cranial nerve deficit.  No focal weakness. Gait normal          Assessment & Plan:  #1 type 2 diabetes. History of good control without medication. Recheck A1c #2 dyslipidemia. Goal LDL less than 70. Recheck lipid and hepatic panel #3 long history of recurrent vertigo. At high risk for falls. Set up physical therapy for fall risk reduction  #4 hypertension well controlled

## 2014-04-21 NOTE — Progress Notes (Signed)
Pre visit review using our clinic review tool, if applicable. No additional management support is needed unless otherwise documented below in the visit note. 

## 2014-05-03 ENCOUNTER — Encounter: Payer: Self-pay | Admitting: Internal Medicine

## 2014-05-03 ENCOUNTER — Ambulatory Visit (INDEPENDENT_AMBULATORY_CARE_PROVIDER_SITE_OTHER): Payer: Medicare Other | Admitting: *Deleted

## 2014-05-03 DIAGNOSIS — G459 Transient cerebral ischemic attack, unspecified: Secondary | ICD-10-CM | POA: Diagnosis not present

## 2014-05-03 LAB — MDC_IDC_ENUM_SESS_TYPE_REMOTE

## 2014-05-06 NOTE — Progress Notes (Signed)
Loop recorder 

## 2014-05-11 LAB — HM DIABETES EYE EXAM

## 2014-05-16 ENCOUNTER — Encounter: Payer: Self-pay | Admitting: Family Medicine

## 2014-06-02 ENCOUNTER — Ambulatory Visit (INDEPENDENT_AMBULATORY_CARE_PROVIDER_SITE_OTHER): Payer: Medicare Other | Admitting: *Deleted

## 2014-06-02 DIAGNOSIS — G459 Transient cerebral ischemic attack, unspecified: Secondary | ICD-10-CM

## 2014-06-02 LAB — CUP PACEART REMOTE DEVICE CHECK: MDC IDC SESS DTM: 20160518110439

## 2014-06-03 NOTE — Progress Notes (Signed)
Loop recorder 

## 2014-06-11 ENCOUNTER — Other Ambulatory Visit: Payer: Self-pay | Admitting: Cardiology

## 2014-06-15 ENCOUNTER — Encounter: Payer: Self-pay | Admitting: Internal Medicine

## 2014-06-26 ENCOUNTER — Other Ambulatory Visit: Payer: Self-pay | Admitting: Family Medicine

## 2014-06-28 ENCOUNTER — Ambulatory Visit: Payer: Medicare Other | Admitting: Family

## 2014-06-28 ENCOUNTER — Other Ambulatory Visit (HOSPITAL_COMMUNITY): Payer: Medicare Other

## 2014-06-29 ENCOUNTER — Ambulatory Visit (INDEPENDENT_AMBULATORY_CARE_PROVIDER_SITE_OTHER): Payer: Medicare Other | Admitting: Family Medicine

## 2014-06-29 ENCOUNTER — Encounter: Payer: Self-pay | Admitting: Family Medicine

## 2014-06-29 VITALS — BP 130/68 | HR 70 | Temp 97.8°F | Wt 129.0 lb

## 2014-06-29 DIAGNOSIS — M7701 Medial epicondylitis, right elbow: Secondary | ICD-10-CM | POA: Diagnosis not present

## 2014-06-29 DIAGNOSIS — M19072 Primary osteoarthritis, left ankle and foot: Secondary | ICD-10-CM

## 2014-06-29 NOTE — Progress Notes (Signed)
Pre visit review using our clinic review tool, if applicable. No additional management support is needed unless otherwise documented below in the visit note. 

## 2014-06-29 NOTE — Progress Notes (Signed)
Subjective:    Patient ID: [name], female    DOB: 08-15-1920, 81 y.o.   MRN: [id number]  HPI Patient seen for the following issues  Left foot pain. MTP joint. No injury. Pain sometimes with walking but inconsistent. She has not noticed any warmth or significant erythema. No history of gout. He takes Tylenol which helped slightly. No clear exacerbating factors.  Right elbow pain. Pain is medial epicondylar region. She sometimes notice with gripping. She is doing exercise classes that involve barbells that seems to aggravate. She has not tried any icing. No weakness. Denies any older neuropathy symptoms. No numbness. No weakness  Past Medical History  Diagnosis Date  . COLONIC POLYPS, ADENOMATOUS 12/26/2009  . DIABETES MELLITUS, TYPE II 08/11/2006  . HYPERLIPIDEMIA 09/09/2006  . ANEMIA, NORMOCYTIC 11/30/2009  . HYPERTENSION 09/09/2006  . CAD, NATIVE VESSEL 04/27/2008    Cardiac catheterization 6/08: EF 55%, 1+ MR, Dx 50%, LAD 50%, dLAD 40%, OM1 50-60% (up to 70%), pOM 30%, pRCA 40%, mRCA 50-60%, pPDA 50-60% (up to 70%).  Medical therapy was recommended  . LEFT BUNDLE BRANCH BLOCK 08/11/2006  . Atrial flutter 04/12/2009  . TIA 09/02/2006    Echocardiogram 6/12: Mild to moderate MR, trace AI, trace TR, PASP 34, bubble study negative for intracardiac shunting, normal EF (greater than 55%)  . HEMORRHOIDS-INTERNAL 12/26/2009  . GERD 02/16/2007  . DIVERTICULOSIS, COLON 08/11/2006  . OSTEOARTHRITIS 08/11/2006  . OSTEOPOROSIS 08/11/2006  . Carotid stenosis     dopplers 3/84: LICA 53-64%  . Hemorrhoids   . Stroke 12-06-13   Past Surgical History  Procedure Laterality Date  . Tonsillectomy    . Eye surgery      catarac  . Closed reduction metatarsal fracture      right 5th  . Colonoscopy w/ polypectomy  2001  . Carotid endarterectomy Left 12-05-2010  . Loop recorder implant N/A 01/03/2014    Procedure: LOOP RECORDER IMPLANT;  Surgeon: Evans Lance, MD;  Location: [facility] CATH LAB;  Service:  Cardiovascular;  Laterality: N/A;    reports that she has never smoked. She has never used smokeless tobacco. She reports that she does not drink alcohol or use illicit drugs. family history includes Breast cancer in her sister; Colon cancer in her brother; Congestive Heart Failure (age of onset: 92) in her father; Heart attack in her mother; Heart disease in her brother; Heart disease (age of onset: 14) in her mother; Kidney disease in her mother; Uterine cancer in her sister. Allergies  Allergen Reactions  . Doxycycline Hyclate     REACTION: upset stomach  . Nitrofurantoin     REACTION: unspecified  . Sulfamethoxazole-Trimethoprim     REACTION: unspecified  . Sulfonamide Derivatives     REACTION: rash      Review of Systems  Constitutional: Negative for fever and chills.  Respiratory: Negative for shortness of breath.   Cardiovascular: Negative for chest pain.  Musculoskeletal: Positive for arthralgias.  Skin: Negative for rash.  Neurological: Negative for weakness and numbness.  Psychiatric/Behavioral: Negative for confusion.       Objective:   Physical Exam  Constitutional: She appears well-developed and well-nourished.  Cardiovascular: Normal rate and regular rhythm.   Pulmonary/Chest: Effort normal and breath sounds normal. No respiratory distress. She has no wheezes.  Musculoskeletal:  Left foot reveals mild hypertrophic changes MTP joint. No warmth. No erythema. No ulcerations. Minimal tenderness to palpation. Foot is warm to touch. Good distal pulses  Right elbow full range of  motion. She has tenderness of the medial epicondylar region. Mild pain with gripping. No lateral tenderness. No olecranon bursa swelling          Assessment & Plan:  #1 left foot pain. Suspect primary osteoarthritis MTP joint. Doubt this represents inflammatory gout. She will stick with Tylenol for now. Avoid nonsteroidals with her age #2 right medial epicondylitis. Re-recommend icing  couple times daily. Offered corticosteroid injection and at this point she wishes to wait. Handout given

## 2014-06-29 NOTE — Patient Instructions (Signed)
Medial Epicondylitis (Golfer's Elbow) with Rehab Medial epicondylitis involves inflammation and pain around the inner (medial) portion of the elbow. This pain is caused by inflammation of the tendons in the forearm that flex (bring down) the wrist. Medial epicondylitis is also called golfer's elbow, because it is common among golfers. However, it may occur in any individual who flexes the wrist regularly. If medial epicondylitis is left untreated, it may become a chronic problem. SYMPTOMS   Pain, tenderness, or inflammation over the inner (medial) side of the elbow.  Pain or weakness with gripping activities.  Pain that increases with wrist twisting motions (using a screwdriver, playing golf, bowling). CAUSES  Medial epicondylitis is caused by inflammation of the tendons that flex the wrist. Causes of injury may include:  Chronic, repetitive stress and strain to the tendons that run from the wrist and forearm to the elbow.  Sudden strain on the forearm, including wrist snap when serving balls with racquet sports, or throwing a baseball. RISK INCREASES WITH:  Sports or occupations that require repetitive and/or strenuous forearm and wrist movements (pitching a baseball, golfing, carpentry).  Poor wrist and forearm strength and flexibility.  Failure to warm up properly before activity.  Resuming activity before healing, rehabilitation, and conditioning are complete. PREVENTION   Warm up and stretch properly before activity.  Maintain physical fitness:  Strength, flexibility, and endurance.  Cardiovascular fitness.  Wear and use properly fitted equipment.  Learn and use proper technique and have a coach correct improper technique.  Wear a tennis elbow (counterforce) brace. PROGNOSIS  The course of this condition depends on the degree of the injury. If treated properly, acute cases (symptoms lasting less than 4 weeks) are often resolved in 2 to 6 weeks. Chronic (longer lasting  cases) often resolve in 3 to 6 months, but may require physical therapy. RELATED COMPLICATIONS   Frequently recurring symptoms, resulting in a chronic problem. Properly treating the problem the first time decreases frequency of recurrence.  Chronic inflammation, scarring, and partial tendon tear, requiring surgery.  Delayed healing or resolution of symptoms. TREATMENT  Treatment first involves the use of ice and medicine, to reduce pain and inflammation. Strengthening and stretching exercises may reduce discomfort, if performed regularly. These exercises may be performed at home, if the condition is an acute injury. Chronic cases may require a referral to a physical therapist for evaluation and treatment. Your caregiver may advise a corticosteroid injection to help reduce inflammation. Rarely, surgery is needed. MEDICATION  If pain medicine is needed, nonsteroidal anti-inflammatory medicines (aspirin and ibuprofen), or other minor pain relievers (acetaminophen), are often advised.  Do not take pain medicine for 7 days before surgery.  Prescription pain relievers may be given, if your caregiver thinks they are needed. Use only as directed and only as much as you need.  Corticosteroid injections may be recommended. These injections should be reserved only for the most severe cases, because they can only be given a certain number of times. HEAT AND COLD  Cold treatment (icing) should be applied for 10 to 15 minutes every 2 to 3 hours for inflammation and pain, and immediately after activity that aggravates your symptoms. Use ice packs or an ice massage.  Heat treatment may be used before performing stretching and strengthening activities prescribed by your caregiver, physical therapist, or athletic trainer. Use a heat pack or a warm water soak. SEEK MEDICAL CARE IF: Symptoms get worse or do not improve in 2 weeks, despite treatment. EXERCISES  RANGE OF MOTION (  ROM) AND STRETCHING EXERCISES -  Epicondylitis, Medial (Golfer's Elbow) These exercises may help you when beginning to rehabilitate your injury. Your symptoms may go away with or without further involvement from your physician, physical therapist or athletic trainer. While completing these exercises, remember:   Restoring tissue flexibility helps normal motion to return to the joints. This allows healthier, less painful movement and activity.  An effective stretch should be held for at least 30 seconds.  A stretch should never be painful. You should only feel a gentle lengthening or release in the stretched tissue. RANGE OF MOTION - Wrist Flexion, Active-Assisted  Extend your right / left elbow with your fingers pointing down.*  Gently pull the back of your hand towards you, until you feel a gentle stretch on the top of your forearm.  Hold this position for __________ seconds. Repeat __________ times. Complete this exercise __________ times per day.  *If directed by your physician, physical therapist or athletic trainer, complete this stretch with your elbow bent, rather than extended. RANGE OF MOTION - Wrist Extension, Active-Assisted  Extend your right / left elbow and turn your palm upwards.*  Gently pull your palm and fingertips back, so your wrist extends and your fingers point more toward the ground.  You should feel a gentle stretch on the inside of your forearm.  Hold this position for __________ seconds. Repeat __________ times. Complete this exercise __________ times per day. *If directed by your physician, physical therapist or athletic trainer, complete this stretch with your elbow bent, rather than extended. STRETCH - Wrist Extension   Place your right / left fingertips on a tabletop leaving your elbow slightly bent. Your fingers should point backwards.  Gently press your fingers and palm down onto the table, by straightening your elbow. You should feel a stretch on the inside of your forearm.  Hold  this position for __________ seconds. Repeat __________ times. Complete this stretch __________ times per day.  STRENGTHENING EXERCISES - Epicondylitis, Medial (Golfer's Elbow) These exercises may help you when beginning to rehabilitate your injury. They may resolve your symptoms with or without further involvement from your physician, physical therapist or athletic trainer. While completing these exercises, remember:   Muscles can gain both the endurance and the strength needed for everyday activities through controlled exercises.  Complete these exercises as instructed by your physician, physical therapist or athletic trainer. Increase the resistance and repetitions only as guided.  You may experience muscle soreness or fatigue, but the pain or discomfort you are trying to eliminate should never worsen during these exercises. If this pain does get worse, stop and make sure you are following the directions exactly. If the pain is still present after adjustments, discontinue the exercise until you can discuss the trouble with your caregiver. STRENGTH - Wrist Flexors  Sit with your right / left forearm palm-up, and fully supported on a table or countertop. Your elbow should be resting below the height of your shoulder. Allow your wrist to extend over the edge of the surface.  Loosely holding a __________ weight, or a piece of rubber exercise band or tubing, slowly curl your hand up toward your forearm.  Hold this position for __________ seconds. Slowly lower the wrist back to the starting position in a controlled manner. Repeat __________ times. Complete this exercise __________ times per day.  STRENGTH - Wrist Extensors  Sit with your right / left forearm palm-down and fully supported. Your elbow should be resting below the height of your shoulder.   Allow your wrist to extend over the edge of the surface.  Loosely holding a __________ weight, or a piece of rubber exercise band or tubing, slowly  curl your hand up toward your forearm.  Hold this position for __________ seconds. Slowly lower the wrist back to the starting position in a controlled manner. Repeat __________ times. Complete this exercise __________ times per day.  STRENGTH - Ulnar Deviators  Stand with a ____________________ weight in your right / left hand, or sit while holding a rubber exercise band or tubing, with your healthy arm supported on a table or countertop.  Move your wrist so that your pinkie travels toward your forearm and your thumb moves away from your forearm.  Hold this position for __________ seconds and then slowly lower the wrist back to the starting position. Repeat __________ times. Complete this exercise __________ times per day STRENGTH - Grip   Grasp a tennis ball, a dense sponge, or a large, rolled sock in your hand.  Squeeze as hard as you can, without increasing any pain.  Hold this position for __________ seconds. Release your grip slowly. Repeat __________ times. Complete this exercise __________ times per day.  STRENGTH - Forearm Supinators   Sit with your right / left forearm supported on a table, keeping your elbow below shoulder height. Rest your hand over the edge, palm down.  Gently grip a hammer or a soup ladle.  Without moving your elbow, slowly turn your palm and hand upward to a "thumbs-up" position.  Hold this position for __________ seconds. Slowly return to the starting position. Repeat __________ times. Complete this exercise __________ times per day.  STRENGTH - Forearm Pronators  Sit with your right / left forearm supported on a table, keeping your elbow below shoulder height. Rest your hand over the edge, palm up.  Gently grip a hammer or a soup ladle.  Without moving your elbow, slowly turn your palm and hand upward to a "thumbs-up" position.  Hold this position for __________ seconds. Slowly return to the starting position. Repeat __________ times. Complete  this exercise __________ times per day.  Document Released: 01/28/2005 Document Revised: 04/22/2011 Document Reviewed: 05/12/2008 [facility] Patient Information 2015 [place], [place]. This information is not intended to replace advice given to you by your health care provider. Make sure you discuss any questions you have with your health care provider.  Continue with Tylenol for the arthritis pain in foot as needed Consider icing right elbow 2-3 times daily for 20- 30 minutes at a time.

## 2014-07-01 ENCOUNTER — Ambulatory Visit (INDEPENDENT_AMBULATORY_CARE_PROVIDER_SITE_OTHER): Payer: Medicare Other | Admitting: *Deleted

## 2014-07-01 DIAGNOSIS — I639 Cerebral infarction, unspecified: Secondary | ICD-10-CM | POA: Diagnosis not present

## 2014-07-05 NOTE — Progress Notes (Signed)
Loop recorder 

## 2014-07-07 ENCOUNTER — Encounter: Payer: Self-pay | Admitting: Internal Medicine

## 2014-07-07 ENCOUNTER — Ambulatory Visit: Payer: Medicare Other | Admitting: Family

## 2014-07-07 ENCOUNTER — Other Ambulatory Visit (HOSPITAL_COMMUNITY): Payer: Medicare Other

## 2014-07-12 ENCOUNTER — Ambulatory Visit (INDEPENDENT_AMBULATORY_CARE_PROVIDER_SITE_OTHER): Payer: Medicare Other | Admitting: Cardiology

## 2014-07-12 ENCOUNTER — Encounter: Payer: Self-pay | Admitting: Cardiology

## 2014-07-12 VITALS — BP 122/64 | HR 73 | Ht 66.5 in | Wt 130.0 lb

## 2014-07-12 DIAGNOSIS — I6529 Occlusion and stenosis of unspecified carotid artery: Secondary | ICD-10-CM

## 2014-07-12 DIAGNOSIS — I2510 Atherosclerotic heart disease of native coronary artery without angina pectoris: Secondary | ICD-10-CM | POA: Diagnosis not present

## 2014-07-12 NOTE — Patient Instructions (Signed)
Medication Instructions:  No changes today.  Labwork: None today  Testing/Procedures: None today  Follow-Up: Your physician wants you to follow-up in: 1 year with Dr Aundra Dubin. (May 2017).  You will receive a reminder letter in the mail two months in advance. If you don't receive a letter, please call our office to schedule the follow-up appointment.

## 2014-07-13 ENCOUNTER — Telehealth: Payer: Self-pay

## 2014-07-13 MED ORDER — ZOLPIDEM TARTRATE 10 MG PO TABS: 10.0000 mg | ORAL_TABLET | Freq: Every evening | ORAL | Status: DC | PRN

## 2014-07-13 NOTE — Telephone Encounter (Signed)
[name]: zolpidem (AMBIEN) 10 MG tablet

## 2014-07-13 NOTE — Telephone Encounter (Signed)
Last visit 06/29/14 Last refill 01/10/14 #90 1 refill

## 2014-07-13 NOTE — Telephone Encounter (Signed)
Refill with one additional refill. 

## 2014-07-13 NOTE — Progress Notes (Signed)
Patient ID: [name], female   DOB: [birth year], 81 y.o.   MRN: [id number] PCP: Dr. Elease Hashimoto  81 yo with history of carotid stenosis s/p CEA, nonobstructive CAD, syncope, and recent TIAs presents for cardiology followup.  She had atrial fibrillation documented years ago but has not had a documented recurrence.  She is not on anticoagulation due to concern for profuse hemorrhoidal bleeding.  Last Cardiolite in 11/14 showed no ischemia or infarction.  In 8/15, she had a syncopal event while standing in the kitchen.  She was not unconscious for long.  She then had 2 episodes concerning for TIAs.  Both times, she lost a portion of her left visual field transiently.  MRI after the 2nd episode showed no evidence for CVA.  She was thought to have a TIA.  She had an ophthalmology appointment and was told that there appears to be nothing wrong with her eyes themselves. Linq monitor was placed on 11/23 to look for atrial fibrillation.  She has had no further TIA-like symptoms.  BP controlled.   She does not get short of breath walking on flat ground.  No orthopnea/PND.  No lightheadedness or tachypalpitations.  No further syncope.  She does get some dyspnea when vacuuming.  She lives alone and does her housework and yardwork.    No atrial fibrillation has been noted on LINQ interrogations.   ECG: NSR, LBBB  Labs (5/15): LDL 41, HDL 46.5 Labs (10/15): K 5.1, creatinine 0.95, HCT 34.9 Labs (3/16): K 4.8, creatinine 1.08, LDL 47, HDL 47  PMH: 1. H/o TIA: Possible TIA in 10/15 with visual field cut.  MRI (10/15) with no CVA. Linq monitor placed.  2. Carotid stenosis: Left CEA in 10/12.  Carotid dopplers (10/15) with 1-39% bilateral ICA stenosis.   3. Atrial fibrillation: Paroxysmal, only prior documented episode was years ago.  She was not started on anticoagulation back then due to hemorrhoidal bleeding, apparently profuse.  Event monitor (3/14) showed only NSR.  4. Hemorrhoids s/p banding.  5. HTN 6.  Type II diabetes 7. Hyperlipidemia 8. LBBB 9. CAD: Nonobstructive.  LHC (6/08) with EF 55%, 1+ MR, Dx 50%, LAD 50%, dLAD 40%, OM1 50-60% (up to 70%), pOM 30%, pRCA 40%, mRCA 50-60%, pPDA 50-60% (up to 70%).  Adenosine Cardiolite (11/14) with EF 67%, no ischemia or infarction.  10. Syncope: 8/15. EEG (5/15) was unremarkable. 11. Echo (4/14) with EF 60-65%, mild MR, aortic sclerosis without stenosis.   SH: Lives alone in [place], 1 son in [place] and 2 other kids in the [place].  No smoking or ETOH.   FH: No premature CAD  ROS: All systems reviewed and negative except as per HPI.   Current Outpatient Prescriptions  Medication Sig Dispense Refill  . Ascorbic Acid (VITAMIN C PO) Take 1 tablet by mouth daily.     Marland Kitchen aspirin 81 MG tablet Take 81 mg by mouth daily.      Marland Kitchen atorvastatin (LIPITOR) 20 MG tablet TAKE 1 TABLET DAILY 90 tablet 1  . calcium carbonate 200 MG capsule Take 250 mg by mouth daily.     . Cholecalciferol (VITAMIN D PO) Take 1 tablet by mouth daily.     . clopidogrel (PLAVIX) 75 MG tablet Take 75 mg by mouth daily.    . Cyanocobalamin (VITAMIN B 12 PO) Take 1 tablet by mouth daily.     . fish oil-omega-3 fatty acids 1000 MG capsule Take 1 g by mouth daily.     Marland Kitchen gabapentin (  NEURONTIN) 100 MG capsule Take 1 capsule (100 mg total) by mouth as needed. (Patient taking differently: Take 100 mg by mouth daily as needed (pain). ) 90 capsule 3  . glucose blood test strip Use as instructed once a week    . metoprolol tartrate (LOPRESSOR) 25 MG tablet Take 12.5 mg by mouth 2 (two) times daily.    . metoprolol tartrate (LOPRESSOR) 25 MG tablet TAKE ONE-HALF (1/2) TABLET TWICE A DAY 90 tablet 1  . Multiple Vitamins-Minerals (MULTIVITAMIN WITH MINERALS) tablet Take 1 tablet by mouth daily.    . Multiple Vitamins-Minerals (OCUVITE ADULT 50+ PO) Take 1 tablet by mouth daily.     . ONE TOUCH LANCETS MISC Use daily as directed 200 each 3  . pantoprazole (PROTONIX) 20 MG tablet Take 1  tablet (20 mg total) by mouth every other day. 90 tablet 1  . VITAMIN E PO Take 1 tablet by mouth daily.     Marland Kitchen zolpidem (AMBIEN) 10 MG tablet Take 1 tablet (10 mg total) by mouth at bedtime as needed. For sleep 90 tablet 1   No current facility-administered medications for this visit.    BP 122/64 mmHg  Pulse 73  Ht 5' 6.5" (1.689 m)  Wt 130 lb (58.968 kg)  BMI 20.67 kg/m2 General: NAD Neck: No JVD, no thyromegaly or thyroid nodule.  Lungs: Clear to auscultation bilaterally with normal respiratory effort. CV: Nondisplaced PMI.  Heart regular S1/S2, no S3/S4, 2/6 early SEM RUSB.  No peripheral edema.  No carotid bruit.  Normal pedal pulses.  Abdomen: Soft, nontender, no hepatosplenomegaly, no distention.  Skin: Intact without lesions or rashes.  Neurologic: Alert and oriented x 3.  Psych: Normal affect. Extremities: No clubbing or cyanosis.  HEENT: Normal.   Assessment/Plan: 1. Possible TIAs: Patient had 2 episodes where she lost a portion of her left visual field (?homonymous hemianopsia).  Both were transient, and head MRI in 10/15 showed no CVA.  She is currently on ASA and Plavix.  I have been concerned that she has paroxysmal atrial fibrillation (apparently there was an episode detected years ago), and in that case, should be anticoagulated.  I do not, however, want to commit her to anticoagulation unless there is clearly documented atrial fibrillation. She now has a Linq monitor, no atrial fibrillation noted so far.  If atrial fibrillation is found, I will have her stop ASA and Plavix and go on Eliquis 2.5 mg bid (reduced dose due to age/weight).  She has had a history of hemorrhoidal bleeding in the past that has required banding but none recently.   2. HTN: BP stable.  3. Hyperlipidemia:   Continue atorvastatin, good lipids 3/16.  Goal LDL < 70 with known vascular disease.  4. Syncope: Episode in 8/15 without prodrome. There has been no recurrence.  She now has a Linq monitor.   5.  Carotid stenosis: s/p CEA, followed by [facility].  6. CAD: Moderate, nonobstructive disease.  No ischemic symptoms.  Continue ASA/Plavix, statin.   May followup in 1 year.  Continue LINQ monitoring.   Loralie Champagne 07/13/2014

## 2014-07-13 NOTE — Telephone Encounter (Signed)
Rx faxed to mail order 

## 2014-07-14 LAB — CUP PACEART REMOTE DEVICE CHECK: Date Time Interrogation Session: 20160602222836

## 2014-08-01 ENCOUNTER — Encounter: Payer: Self-pay | Admitting: Internal Medicine

## 2014-08-01 ENCOUNTER — Ambulatory Visit (INDEPENDENT_AMBULATORY_CARE_PROVIDER_SITE_OTHER): Payer: Medicare Other | Admitting: *Deleted

## 2014-08-01 DIAGNOSIS — I639 Cerebral infarction, unspecified: Secondary | ICD-10-CM

## 2014-08-02 NOTE — Progress Notes (Signed)
Loop recorder 

## 2014-08-08 ENCOUNTER — Other Ambulatory Visit: Payer: Self-pay

## 2014-08-09 LAB — CUP PACEART REMOTE DEVICE CHECK: Date Time Interrogation Session: 20160628090658

## 2014-08-22 ENCOUNTER — Telehealth: Payer: Self-pay | Admitting: Internal Medicine

## 2014-08-22 NOTE — Telephone Encounter (Signed)
New Message      Pt calling stating that she had an episode with her eyes and is wondering if she had a TIA. Please call back and advise.

## 2014-08-22 NOTE — Telephone Encounter (Signed)
Spoke with patient who states on Saturday morning she was reading the newspaper, states letters on paper appeared to be moving up and down.  She is unsure as to how long these symptoms lasted, states seemed like a long time but was probably not long.  She asks if her linq monitor recorded anything during this time.  I spoke with Garlon Hatchet in device clinic who checked patient's device download; she reports no incidences since November 2015.  I advised patient of those findings and she states she is confused as to what exactly the monitor will show.  I advised her that the symptoms of a TIA are not recorded, that generally these are based on patient's report.  I advised her that Dr. Aundra Dubin advised the [facility] be placed to look for atrial fibrillation which if found could lead to stroke.  I advised her that no episodes of atrial fib have been identified and to call her PCP or neurologist, both of which are aware patient has LINQ, to report symptoms and seek advice.  Patient states she wonders if symptoms are r/t macular degeneration and I advised her to talk with her optometrist about this.  Patient verbalized understanding and agreement.

## 2014-08-23 ENCOUNTER — Encounter: Payer: Self-pay | Admitting: Internal Medicine

## 2014-08-31 ENCOUNTER — Ambulatory Visit (INDEPENDENT_AMBULATORY_CARE_PROVIDER_SITE_OTHER): Payer: Medicare Other | Admitting: *Deleted

## 2014-08-31 DIAGNOSIS — I639 Cerebral infarction, unspecified: Secondary | ICD-10-CM

## 2014-08-31 NOTE — Progress Notes (Signed)
Loop recorder 

## 2014-09-01 LAB — CUP PACEART REMOTE DEVICE CHECK: MDC IDC SESS DTM: 20160721153730

## 2014-09-22 ENCOUNTER — Encounter: Payer: Self-pay | Admitting: Internal Medicine

## 2014-09-28 ENCOUNTER — Other Ambulatory Visit: Payer: Self-pay | Admitting: Family Medicine

## 2014-09-30 ENCOUNTER — Ambulatory Visit (INDEPENDENT_AMBULATORY_CARE_PROVIDER_SITE_OTHER): Payer: Medicare Other | Admitting: *Deleted

## 2014-09-30 DIAGNOSIS — I639 Cerebral infarction, unspecified: Secondary | ICD-10-CM | POA: Diagnosis not present

## 2014-10-05 NOTE — Progress Notes (Signed)
Loop recorder 

## 2014-10-06 ENCOUNTER — Other Ambulatory Visit: Payer: Self-pay | Admitting: Family Medicine

## 2014-10-06 LAB — CUP PACEART REMOTE DEVICE CHECK: MDC IDC SESS DTM: 20160825094938

## 2014-10-06 NOTE — Progress Notes (Signed)
Carelink summary report received. Battery status OK. Normal device function. No new symptom episodes, tachy episodes, brady, or pause episodes. No new AF episodes. Monthly summary reports and ROV with GT in Nov.

## 2014-10-13 ENCOUNTER — Encounter: Payer: Self-pay | Admitting: Internal Medicine

## 2014-10-24 ENCOUNTER — Encounter: Payer: Self-pay | Admitting: Family Medicine

## 2014-10-24 ENCOUNTER — Ambulatory Visit (INDEPENDENT_AMBULATORY_CARE_PROVIDER_SITE_OTHER): Payer: Medicare Other | Admitting: Family Medicine

## 2014-10-24 VITALS — BP 130/78 | HR 63 | Temp 97.7°F | Wt 128.0 lb

## 2014-10-24 DIAGNOSIS — J018 Other acute sinusitis: Secondary | ICD-10-CM

## 2014-10-24 DIAGNOSIS — E119 Type 2 diabetes mellitus without complications: Secondary | ICD-10-CM

## 2014-10-24 DIAGNOSIS — R200 Anesthesia of skin: Secondary | ICD-10-CM

## 2014-10-24 DIAGNOSIS — R208 Other disturbances of skin sensation: Secondary | ICD-10-CM

## 2014-10-24 LAB — HEMOGLOBIN A1C: Hgb A1c MFr Bld: 6.7 % — ABNORMAL HIGH (ref 4.6–6.5)

## 2014-10-24 MED ORDER — AMOXICILLIN 875 MG PO TABS: 875.0000 mg | ORAL_TABLET | Freq: Two times a day (BID) | ORAL | Status: AC

## 2014-10-24 NOTE — Progress Notes (Signed)
Subjective:    Patient ID: [name], female    DOB: 04/10/1920, 81 y.o.   MRN: [id number]  HPI Patient here with multiple issues  Concern for sinusitis. She describes several weeks of some colored nasal discharge. She's had some left submandibular pains and occasional left frontal sinus pain. Occasional headaches. No blurred vision. Frequent sinus congestion. Increased malaise. No fever.  Intermittent numbness involving the fourth and occasionally fifth toe of the right foot. No weakness. No back pain. No urine or stool incontinence. Symptoms are relatively mild  Type 2 diabetes. Last A1c 6.9%. Does not monitor blood sugars regularly. No symptoms of polyuria or polydipsia.  Past Medical History  Diagnosis Date  . COLONIC POLYPS, ADENOMATOUS 12/26/2009  . DIABETES MELLITUS, TYPE II 08/11/2006  . HYPERLIPIDEMIA 09/09/2006  . ANEMIA, NORMOCYTIC 11/30/2009  . HYPERTENSION 09/09/2006  . CAD, NATIVE VESSEL 04/27/2008    Cardiac catheterization 6/08: EF 55%, 1+ MR, Dx 50%, LAD 50%, dLAD 40%, OM1 50-60% (up to 70%), pOM 30%, pRCA 40%, mRCA 50-60%, pPDA 50-60% (up to 70%).  Medical therapy was recommended  . LEFT BUNDLE BRANCH BLOCK 08/11/2006  . Atrial flutter 04/12/2009  . TIA 09/02/2006    Echocardiogram 6/12: Mild to moderate MR, trace AI, trace TR, PASP 34, bubble study negative for intracardiac shunting, normal EF (greater than 55%)  . HEMORRHOIDS-INTERNAL 12/26/2009  . GERD 02/16/2007  . DIVERTICULOSIS, COLON 08/11/2006  . OSTEOARTHRITIS 08/11/2006  . OSTEOPOROSIS 08/11/2006  . Carotid stenosis     dopplers 0/35: LICA 00-93%  . Hemorrhoids   . Stroke 12-06-13   Past Surgical History  Procedure Laterality Date  . Tonsillectomy    . Eye surgery      catarac  . Closed reduction metatarsal fracture      right 5th  . Colonoscopy w/ polypectomy  2001  . Carotid endarterectomy Left 12-05-2010  . Loop recorder implant N/A 01/03/2014    Procedure: LOOP RECORDER IMPLANT;  Surgeon: Evans Lance, MD;  Location: [facility] CATH LAB;  Service: Cardiovascular;  Laterality: N/A;    reports that she has never smoked. She has never used smokeless tobacco. She reports that she does not drink alcohol or use illicit drugs. family history includes Breast cancer in her sister; Colon cancer in her brother; Congestive Heart Failure (age of onset: 56) in her father; Heart attack in her mother; Heart disease in her brother; Heart disease (age of onset: 77) in her mother; Kidney disease in her mother; Uterine cancer in her sister. Allergies  Allergen Reactions  . Doxycycline Hyclate     REACTION: upset stomach  . Nitrofurantoin     REACTION: unspecified  . Sulfamethoxazole-Trimethoprim     REACTION: unspecified  . Sulfonamide Derivatives     REACTION: rash      Review of Systems  Constitutional: Negative for appetite change, fatigue and unexpected weight change.  HENT: Positive for congestion.   Eyes: Negative for visual disturbance.  Respiratory: Negative for cough, chest tightness, shortness of breath and wheezing.   Cardiovascular: Negative for chest pain, palpitations and leg swelling.  Gastrointestinal: Negative for abdominal pain.  Endocrine: Negative for polydipsia and polyuria.  Neurological: Negative for dizziness, seizures, syncope, weakness, light-headedness and headaches.       Objective:   Physical Exam  Constitutional: She is oriented to person, place, and time. She appears well-developed and well-nourished.  HENT:  Right Ear: External ear normal.  Left Ear: External ear normal.  Mouth/Throat: Oropharynx is clear and  moist.  Neck: Neck supple.  Slightly tender left submandibular area but no erythema. No warmth. We cannot appreciate any abnormal enlargement compared to the right side.  Cardiovascular: Normal rate and regular rhythm.   Pulmonary/Chest: Effort normal and breath sounds normal. No respiratory distress. She has no wheezes. She has no rales.    Musculoskeletal: She exhibits edema.  Lymphadenopathy:    She has no cervical adenopathy.  Neurological: She is alert and oriented to person, place, and time. She has normal reflexes.  Full strength lower extremities.  Psychiatric: She has a normal mood and affect. Her behavior is normal.          Assessment & Plan:  #1 type 2 diabetes. History of good control. Recheck A1c #2 possible acute versus chronic sinusitis. Amoxicillin 875 mg twice daily for 10 days. Consider limited CT maxillofacial if symptoms persist #3 intermittent numbness right fourth and fifth toes. Otherwise nonfocal exam. She has good peripheral pulses. No signs of weakness. Observe for now and follow up promptly she has any lower extremity pain or weakness

## 2014-10-24 NOTE — Progress Notes (Signed)
Pre visit review using our clinic review tool, if applicable. No additional management support is needed unless otherwise documented below in the visit note. 

## 2014-10-31 ENCOUNTER — Telehealth: Payer: Self-pay | Admitting: Family Medicine

## 2014-10-31 ENCOUNTER — Ambulatory Visit (INDEPENDENT_AMBULATORY_CARE_PROVIDER_SITE_OTHER): Payer: Medicare Other | Admitting: *Deleted

## 2014-10-31 DIAGNOSIS — I639 Cerebral infarction, unspecified: Secondary | ICD-10-CM

## 2014-10-31 NOTE — Telephone Encounter (Signed)
Any suggestions>?

## 2014-10-31 NOTE — Telephone Encounter (Signed)
PLEASE NOTE: All timestamps contained within this report are represented as [place] Standard Time. CONFIDENTIALTY NOTICE: This fax transmission is intended only for the addressee. It contains information that is legally privileged, confidential or otherwise protected from use or disclosure. If you are not the intended recipient, you are strictly prohibited from reviewing, disclosing, copying using or disseminating any of this information or taking any action in reliance on or regarding this information. If you have received this fax in error, please notify us immediately by telephone so that we can arrange for its return to [place]. Phone: [phone], Toll-Free: [phone], Fax: [phone] Page: 1 of 1 Call Id: [id number] [facility] - Client [place] Patient Name: Anahid Lebeck DOB: 27-Sep-1920 Initial Comment Caller states she was put on amoxicillin last week and she has not been getting any better with her allergies. She is coughing up a lot of phlegm Nurse Assessment Guidelines Guideline Title Affirmed Question Affirmed Notes Final Disposition User Clinical Call Ballinger, RN, Amy Comments CALLER STATES THAT SHE HAS MADE AN APPT WITH HER ENT. WILL CLOSE THIS CLINICAL CALL.

## 2014-11-01 NOTE — Telephone Encounter (Signed)
Called and spoke with patient. Patient states she does not have as much drainage since last week. Has appointment to see ENT tomorrow. Advised to ensure completing Amoxicillin cycle and to call office if symptoms worsen. Patient verbalized understanding.

## 2014-11-02 NOTE — Progress Notes (Signed)
Loop recorder 

## 2014-11-05 LAB — CUP PACEART REMOTE DEVICE CHECK: MDC IDC SESS DTM: 20160918160731

## 2014-11-05 NOTE — Progress Notes (Signed)
Carelink summary report received. Battery status OK. Normal device function. No new symptom episodes, tachy episodes, brady, or pause episodes. No new AF episodes. Monthly summary reports and ROV with GT in 12/2014.

## 2014-11-14 ENCOUNTER — Other Ambulatory Visit: Payer: Self-pay | Admitting: *Deleted

## 2014-11-14 DIAGNOSIS — I6523 Occlusion and stenosis of bilateral carotid arteries: Secondary | ICD-10-CM

## 2014-11-14 DIAGNOSIS — Z48812 Encounter for surgical aftercare following surgery on the circulatory system: Secondary | ICD-10-CM

## 2014-11-16 ENCOUNTER — Ambulatory Visit (INDEPENDENT_AMBULATORY_CARE_PROVIDER_SITE_OTHER): Payer: Medicare Other | Admitting: Family Medicine

## 2014-11-16 ENCOUNTER — Encounter: Payer: Self-pay | Admitting: Family Medicine

## 2014-11-16 ENCOUNTER — Ambulatory Visit (HOSPITAL_COMMUNITY)
Admission: RE | Admit: 2014-11-16 | Discharge: 2014-11-16 | Disposition: A | Discharge status: Home / Self Care | Payer: Medicare Other | Source: Ambulatory Visit | Attending: Family Medicine | Admitting: Family Medicine

## 2014-11-16 VITALS — BP 156/67 | HR 91 | Temp 98.6°F | Ht 66.5 in | Wt 130.0 lb

## 2014-11-16 DIAGNOSIS — R0781 Pleurodynia: Secondary | ICD-10-CM | POA: Insufficient documentation

## 2014-11-16 MED ORDER — TRAMADOL HCL 50 MG PO TABS: 50.0000 mg | ORAL_TABLET | Freq: Four times a day (QID) | ORAL | Status: DC | PRN

## 2014-11-16 NOTE — Addendum Note (Signed)
Addended by: Alysia Penna A on: 11/16/2014 03:13 PM   Modules accepted: Orders

## 2014-11-16 NOTE — Progress Notes (Signed)
Pre visit review using our clinic review tool, if applicable. No additional management support is needed unless otherwise documented below in the visit note. 

## 2014-11-16 NOTE — Progress Notes (Signed)
   Subjective:    Patient ID: [name], female    DOB: 1920/12/27, 81 y.o.   MRN: [id number]  HPI Here for left rib pain after an accident at home 3 days ago. She had been on her knees cleaning the floor in her kitchen, leaning over a stool. When she went o push herself up she felt a sudden sharp pain in the left ribs tat is still present. She is not SOB but ut hurts to take a deep breath. On Tylenol.    Review of Systems  Constitutional: Negative.   Respiratory: Negative.   Cardiovascular: Positive for chest pain. Negative for palpitations and leg swelling.  Genitourinary: Negative.   Neurological: Negative.        Objective:   Physical Exam  Constitutional: She is oriented to person, place, and time. She appears well-developed and well-nourished. No distress.  Cardiovascular: Normal rate, regular rhythm, normal heart sounds and intact distal pulses.   Pulmonary/Chest: Effort normal and breath sounds normal. No respiratory distress. She has no wheezes. She has no rales.  She is quite tender over the inferior lateral left ribs, no crepitus   Neurological: She is alert and oriented to person, place, and time.          Assessment & Plan:  Possible rib fracture, use Tramadol for pain. Apply ice. Get Xrays this afternoon

## 2014-11-17 ENCOUNTER — Encounter: Payer: Self-pay | Admitting: Internal Medicine

## 2014-11-17 ENCOUNTER — Telehealth: Payer: Self-pay | Admitting: Family Medicine

## 2014-11-17 NOTE — Telephone Encounter (Signed)
Pt went to [facility] for rib xray and would like results

## 2014-11-18 NOTE — Telephone Encounter (Signed)
I spoke with pt and went over results. 

## 2014-11-24 ENCOUNTER — Encounter: Payer: Self-pay | Admitting: Vascular Surgery

## 2014-11-29 ENCOUNTER — Encounter: Payer: Self-pay | Admitting: Vascular Surgery

## 2014-11-29 ENCOUNTER — Ambulatory Visit (INDEPENDENT_AMBULATORY_CARE_PROVIDER_SITE_OTHER): Payer: Medicare Other | Admitting: Vascular Surgery

## 2014-11-29 ENCOUNTER — Ambulatory Visit (INDEPENDENT_AMBULATORY_CARE_PROVIDER_SITE_OTHER): Payer: Medicare Other | Admitting: *Deleted

## 2014-11-29 ENCOUNTER — Ambulatory Visit (HOSPITAL_COMMUNITY)
Admission: RE | Admit: 2014-11-29 | Discharge: 2014-11-29 | Disposition: A | Discharge status: Home / Self Care | Payer: Medicare Other | Source: Ambulatory Visit | Attending: Vascular Surgery | Admitting: Vascular Surgery

## 2014-11-29 VITALS — BP 163/66 | HR 69 | Temp 97.9°F | Resp 16 | Ht 66.5 in | Wt 129.0 lb

## 2014-11-29 DIAGNOSIS — I6523 Occlusion and stenosis of bilateral carotid arteries: Secondary | ICD-10-CM | POA: Diagnosis not present

## 2014-11-29 DIAGNOSIS — I638 Other cerebral infarction: Secondary | ICD-10-CM

## 2014-11-29 DIAGNOSIS — I6521 Occlusion and stenosis of right carotid artery: Secondary | ICD-10-CM | POA: Diagnosis not present

## 2014-11-29 DIAGNOSIS — Z48812 Encounter for surgical aftercare following surgery on the circulatory system: Secondary | ICD-10-CM | POA: Diagnosis not present

## 2014-11-29 DIAGNOSIS — I6522 Occlusion and stenosis of left carotid artery: Secondary | ICD-10-CM | POA: Diagnosis not present

## 2014-11-29 DIAGNOSIS — I6389 Other cerebral infarction: Secondary | ICD-10-CM

## 2014-11-29 NOTE — Progress Notes (Signed)
HISTORY AND PHYSICAL     CC:  Follow up carotid duplex scan  Referring Provider:  Eulas Post, MD  HPI: This is a 81 y.o. female who has known carotid stenosis is here for f/u carotid duplex scan.  Denies amaurosis fugax, paresthesias, or hemiparesis.  She did have a left CEA 12/05/14 by Dr. Donnetta Hutching.  She states that about a year ago, she did have a fainting spell and dropped the frying pan and received a burn, but did not describe any visual changes or hemiparesis.  She states that she does have some numbness in both her feet in the mornings, but this gets better.    She does take a beta blocker for hypertension.  She is on Plavix and aspirin.  She does take a statin for cholesterol management.   Past Medical History  Diagnosis Date  . COLONIC POLYPS, ADENOMATOUS 12/26/2009  . DIABETES MELLITUS, TYPE II 08/11/2006  . HYPERLIPIDEMIA 09/09/2006  . ANEMIA, NORMOCYTIC 11/30/2009  . HYPERTENSION 09/09/2006  . CAD, NATIVE VESSEL 04/27/2008    Cardiac catheterization 6/08: EF 55%, 1+ MR, Dx 50%, LAD 50%, dLAD 40%, OM1 50-60% (up to 70%), pOM 30%, pRCA 40%, mRCA 50-60%, pPDA 50-60% (up to 70%).  Medical therapy was recommended  . LEFT BUNDLE BRANCH BLOCK 08/11/2006  . Atrial flutter ([place]) 04/12/2009  . TIA 09/02/2006    Echocardiogram 6/12: Mild to moderate MR, trace AI, trace TR, PASP 34, bubble study negative for intracardiac shunting, normal EF (greater than 55%)  . HEMORRHOIDS-INTERNAL 12/26/2009  . GERD 02/16/2007  . DIVERTICULOSIS, COLON 08/11/2006  . OSTEOARTHRITIS 08/11/2006  . OSTEOPOROSIS 08/11/2006  . Carotid stenosis     dopplers 4/19: LICA 37-90%  . Hemorrhoids   . Stroke [facility]) 12-06-13    Past Surgical History  Procedure Laterality Date  . Tonsillectomy    . Eye surgery      catarac  . Closed reduction metatarsal fracture      right 5th  . Colonoscopy w/ polypectomy  2001  . Carotid endarterectomy Left 12-05-2010  . Loop recorder implant N/A 01/03/2014    Procedure:  LOOP RECORDER IMPLANT;  Surgeon: Evans Lance, MD;  Location: [facility] CATH LAB;  Service: Cardiovascular;  Laterality: N/A;    Allergies  Allergen Reactions  . Doxycycline Hyclate     REACTION: upset stomach  . Nitrofurantoin     REACTION: unspecified  . Sulfamethoxazole-Trimethoprim     REACTION: unspecified  . Sulfonamide Derivatives     REACTION: rash    Current Outpatient Prescriptions  Medication Sig Dispense Refill  . Ascorbic Acid (VITAMIN C PO) Take 1 tablet by mouth daily.     Marland Kitchen aspirin 81 MG tablet Take 81 mg by mouth daily.      Marland Kitchen atorvastatin (LIPITOR) 20 MG tablet TAKE 1 TABLET DAILY (Patient taking differently: TAKE 1 TABLET DAILY at Bedtime) 90 tablet 1  . calcium carbonate 200 MG capsule Take 250 mg by mouth daily.     . Cholecalciferol (VITAMIN D PO) Take 1 tablet by mouth daily.     . clopidogrel (PLAVIX) 75 MG tablet TAKE 1 TABLET DAILY 90 tablet 2  . Cyanocobalamin (VITAMIN B 12 PO) Take 1 tablet by mouth daily.     . fish oil-omega-3 fatty acids 1000 MG capsule Take 1 g by mouth daily.     Marland Kitchen gabapentin (NEURONTIN) 100 MG capsule Take 1 capsule (100 mg total) by mouth as needed. (Patient taking differently: Take 100 mg by mouth  daily as needed (pain). ) 90 capsule 3  . glucose blood test strip Use as instructed once a week    . metoprolol tartrate (LOPRESSOR) 25 MG tablet Take 12.5 mg by mouth 2 (two) times daily.    . metoprolol tartrate (LOPRESSOR) 25 MG tablet TAKE ONE-HALF (1/2) TABLET TWICE A DAY 90 tablet 1  . Multiple Vitamins-Minerals (MULTIVITAMIN WITH MINERALS) tablet Take 1 tablet by mouth daily.    . Multiple Vitamins-Minerals (OCUVITE ADULT 50+ PO) Take 1 tablet by mouth daily.     . ONE TOUCH LANCETS MISC Use daily as directed 200 each 3  . pantoprazole (PROTONIX) 20 MG tablet TAKE 1 TABLET EVERY OTHER DAY 90 tablet 1  . traMADol (ULTRAM) 50 MG tablet Take 1 tablet (50 mg total) by mouth every 6 (six) hours as needed for moderate pain. 60 tablet 0  .  VITAMIN E PO Take 1 tablet by mouth daily.     Marland Kitchen zolpidem (AMBIEN) 10 MG tablet Take 1 tablet (10 mg total) by mouth at bedtime as needed. For sleep 90 tablet 1   No current facility-administered medications for this visit.    Pt's meds include: Statin:  Yes.   Beta Blocker:  Yes.   Aspirin:  Yes.   Other antiplatelets/anticoagulants:  Yes.   Plavix   Family History  Problem Relation Age of Onset  . Heart disease Mother 74  . Kidney disease Mother   . Heart attack Mother   . Congestive Heart Failure Father 52  . Breast cancer Sister   . Colon cancer Brother   . Heart disease Brother   . Uterine cancer Sister     Social History   Social History  . Marital Status: Widowed    Spouse Name: N/A  . Number of Children: 3  . Years of Education: 12   Occupational History  .     Social History Main Topics  . Smoking status: Never Smoker   . Smokeless tobacco: Never Used  . Alcohol Use: No  . Drug Use: No  . Sexual Activity: Not on file   Other Topics Concern  . Not on file   Social History Narrative   Patient is a widow and lives alone.   Patient has three adult children.   Patient is retired.   Patient has a high school.   Patient is right-handed.   Patient does not drink any caffeine.     ROS: []  Positive   [ ]  Negative   [x ] All sytems reviewed and are negative  Cardiovascular: []  chest pain/pressure []  palpitations []  SOB lying flat []  DOE []  pain in legs while walking []  pain in feet when lying flat []  hx of DVT []  hx of phlebitis []  swelling in legs []  varicose veins  Pulmonary: []  productive cough []  asthma []  wheezing  Neurologic: []  weakness in []  arms []  legs []  numbness in []  arms []  legs [] difficulty speaking or slurred speech []  temporary loss of vision in one eye []  dizziness  Hematologic: []  bleeding problems []  problems with blood clotting easily  GI []  vomiting blood []  blood in stool  GU: []  burning with urination []   blood in urine  Psychiatric: []  hx of major depression  Integumentary: []  rashes []  ulcers  Constitutional: []  fever []  chills   PHYSICAL EXAMINATION:  Filed Vitals:   11/29/14 1356  BP: 163/66  Pulse: 69  Temp:   Resp:    Body mass index is 20.51 kg/(m^2).  General:  WDWN in NAD Gait: Normal HENT: WNL; normocephalic Pulmonary: normal non-labored breathing , without Rales, rhonchi,  wheezing Cardiac: regular, with  Systolic Murmur, rubs or gallops; without carotid bruits Abdomen: soft, NT, no masses Skin: without rashes,  without ulcers  Vascular Exam/Pulses:  Right Left  Radial 2+ (normal) 2+ (normal)  Ulnar 2+ (normal) 2+ (normal)  DP 2+ (normal) 2+ (normal)  PT 2+ (normal) 2+ (normal)   Extremities: without ischemic changes, without Gangrene , without cellulitis; without open wounds;  Musculoskeletal: without muscle wasting or atrophy  Neurologic: A&O X 3; Appropriate Affect ; SENSATION: normal; MOTOR FUNCTION:  moving all extremities equally. Speech is fluent/normal   Non-Invasive Vascular Imaging: Carotid Duplex Scan:  11/29/2014  1.  Less than 40% RICA stenosis 2.  Patent left carotid endarterectomy site with no evidence of restenosis.  ASSESSMENT: 81 y.o. female here for f/u carotid duplex scan s/p left CEA  PLAN: -pt is doing well without neurologic events -will f/u in 1 year with repeat carotid duplex -she knows that if she has any visual changes, one sided weakness or paralysis that she is to call us or go to the ER    Leontine Locket, PA-C [facility] [phone]  Clinic MD:   Pt seen and examined in conjunction with Dr. Donnetta Hutching   I have examined the patient, reviewed and agree with above.  Curt Jews, MD 11/29/2014 3:29 PM

## 2014-11-29 NOTE — Progress Notes (Signed)
Filed Vitals:   11/29/14 1346 11/29/14 1348 11/29/14 1355 11/29/14 1356  BP: 166/66 154/67 167/71 163/66  Pulse: 70 68 72 69  Temp:  97.9 F (36.6 C)    TempSrc:  Oral    Resp:  16    Height:  5' 6.5" (1.689 m)    Weight:  129 lb (58.514 kg)    SpO2:  100%

## 2014-12-01 ENCOUNTER — Telehealth: Payer: Self-pay | Admitting: Internal Medicine

## 2014-12-01 ENCOUNTER — Ambulatory Visit (INDEPENDENT_AMBULATORY_CARE_PROVIDER_SITE_OTHER): Payer: Medicare Other | Admitting: Family Medicine

## 2014-12-01 ENCOUNTER — Encounter: Payer: Self-pay | Admitting: Family Medicine

## 2014-12-01 ENCOUNTER — Ambulatory Visit: Payer: Medicare Other | Admitting: Family Medicine

## 2014-12-01 VITALS — BP 110/60 | HR 78 | Temp 97.7°F | Wt 127.0 lb

## 2014-12-01 DIAGNOSIS — R5382 Chronic fatigue, unspecified: Secondary | ICD-10-CM

## 2014-12-01 DIAGNOSIS — R531 Weakness: Secondary | ICD-10-CM

## 2014-12-01 MED ORDER — GLUCOSE BLOOD VI STRP: ORAL_STRIP | Status: DC

## 2014-12-01 NOTE — Progress Notes (Signed)
Subjective:    Patient ID: [name], female    DOB: [birth year], 81 y.o.   MRN: [id number]  HPI Patient seen with 2 week history of very nonspecific symptoms of fatigue and weakness. She states that this tends to be worse early in the mornings. She states she is sleeping very well but wakes up feeling fatigued. She's had some decreased appetite but weight has been stable. She denies any chest pain. Denies dyspnea. No dysuria. Denies any dizziness or syncope. She has diabetes which has been well controlled with last A1c 6.7%. No polyuria or polydipsia. Denies depression. No nausea, vomiting, or diarrhea. She states that she "feels weak in the stomach ". On further description she denies any pain whatsoever in abdomen or chest.  No myalgias.    Chronic problems include controlled type 2 diabetes, history of cerebrovascular disease, hypertension, hyperlipidemia, history of TIA, peripheral vascular disease.  Past Medical History  Diagnosis Date  . COLONIC POLYPS, ADENOMATOUS 12/26/2009  . DIABETES MELLITUS, TYPE II 08/11/2006  . HYPERLIPIDEMIA 09/09/2006  . ANEMIA, NORMOCYTIC 11/30/2009  . HYPERTENSION 09/09/2006  . CAD, NATIVE VESSEL 04/27/2008    Cardiac catheterization 6/08: EF 55%, 1+ MR, Dx 50%, LAD 50%, dLAD 40%, OM1 50-60% (up to 70%), pOM 30%, pRCA 40%, mRCA 50-60%, pPDA 50-60% (up to 70%).  Medical therapy was recommended  . LEFT BUNDLE BRANCH BLOCK 08/11/2006  . Atrial flutter ([place]) 04/12/2009  . TIA 09/02/2006    Echocardiogram 6/12: Mild to moderate MR, trace AI, trace TR, PASP 34, bubble study negative for intracardiac shunting, normal EF (greater than 55%)  . HEMORRHOIDS-INTERNAL 12/26/2009  . GERD 02/16/2007  . DIVERTICULOSIS, COLON 08/11/2006  . OSTEOARTHRITIS 08/11/2006  . OSTEOPOROSIS 08/11/2006  . Carotid stenosis     dopplers 7/84: LICA 69-62%  . Hemorrhoids   . Stroke [facility]) 12-06-13   Past Surgical History  Procedure Laterality Date  . Tonsillectomy    . Eye surgery      catarac  . Closed reduction metatarsal fracture      right 5th  . Colonoscopy w/ polypectomy  2001  . Carotid endarterectomy Left 12-05-2010  . Loop recorder implant N/A 01/03/2014    Procedure: LOOP RECORDER IMPLANT;  Surgeon: Evans Lance, MD;  [facility] C[facility] LAB;  Service: Cardiovascular;  Laterality: N/A;    reports that she has never smoked. She has never used smokeless tobacco. She reports that she does not drink alcohol or use illicit drugs. family history includes Breast cancer in her sister; Colon cancer in her brother; Congestive Heart Failure (age of onset: 33) in her father; Heart attack in her mother; Heart disease in her brother; Heart disease (age of onset: 75) in her mother; Kidney disease in her mother; Uterine cancer in her sister. Allergies  Allergen Reactions  . Doxycycline Hyclate     REACTION: upset stomach  . Nitrofurantoin     REACTION: unspecified  . Sulfamethoxazole-Trimethoprim     REACTION: unspecified  . Sulfonamide Derivatives     REACTION: rash      Review of Systems  Constitutional: Positive for fatigue. Negative for fever, appetite change and unexpected weight change.  HENT: Positive for congestion.   Eyes: Negative for visual disturbance.  Respiratory: Negative for cough, chest tightness, shortness of breath and wheezing.   Cardiovascular: Negative for chest pain, palpitations and leg swelling.  Gastrointestinal: Negative for nausea, vomiting, abdominal pain, diarrhea and blood in stool.  Neurological: Positive for weakness (diffuse and not focal). Negative  for dizziness, seizures, syncope, light-headedness and headaches.  Psychiatric/Behavioral: Negative for sleep disturbance and dysphoric mood.       Objective:   Physical Exam  Constitutional: She is oriented to person, place, and time. She appears well-developed and well-nourished.  HENT:  Right Ear: External ear normal.  Left Ear: External ear normal.  Mouth/Throat: Oropharynx is  clear and moist.  Neck: Neck supple. No thyromegaly present.  Cardiovascular: Normal rate and regular rhythm.   Pulmonary/Chest: Effort normal and breath sounds normal. No respiratory distress. She has no wheezes. She has no rales.  Abdominal: Soft. Bowel sounds are normal. She exhibits no distension and no mass. There is no tenderness. There is no rebound and no guarding.  Musculoskeletal: She exhibits no edema.  Neurological: She is alert and oriented to person, place, and time. No cranial nerve deficit.  No focal weakness          Assessment & Plan:  Patient presents with very nonspecific symptoms of general weakness and fatigue. This seems to be somewhat intermittent. Nonfocal exam. Obtain further labs with CBC, TSH, basic metabolic panel.  Follow-up immediately for any new symptoms. Refills of her glucose test strips given.

## 2014-12-01 NOTE — Telephone Encounter (Signed)
°  New Message  Pt received a letter staing she had a remote check appointment. Pt states she knows nothing about it.. Please call back to discuss

## 2014-12-01 NOTE — Progress Notes (Signed)
Pre visit review using our clinic review tool, if applicable. No additional management support is needed unless otherwise documented below in the visit note. 

## 2014-12-02 LAB — CBC WITH DIFFERENTIAL/PLATELET
BASOS PCT: 1.2 % (ref 0.0–3.0)
Basophils Absolute: 0.1 10*3/uL (ref 0.0–0.1)
Eosinophils Absolute: 0.2 10*3/uL (ref 0.0–0.7)
Eosinophils Relative: 2.5 % (ref 0.0–5.0)
HEMATOCRIT: 35.8 % — AB (ref 36.0–46.0)
Hemoglobin: 11.9 g/dL — ABNORMAL LOW (ref 12.0–15.0)
LYMPHS PCT: 32.9 % (ref 12.0–46.0)
Lymphs Abs: 2.9 10*3/uL (ref 0.7–4.0)
MCHC: 33.2 g/dL (ref 30.0–36.0)
MCV: 97.1 fl (ref 78.0–100.0)
MONOS PCT: 8 % (ref 3.0–12.0)
Monocytes Absolute: 0.7 10*3/uL (ref 0.1–1.0)
NEUTROS ABS: 4.9 10*3/uL (ref 1.4–7.7)
Neutrophils Relative %: 55.4 % (ref 43.0–77.0)
PLATELETS: 206 10*3/uL (ref 150.0–400.0)
RBC: 3.69 Mil/uL — ABNORMAL LOW (ref 3.87–5.11)
RDW: 14.1 % (ref 11.5–15.5)
WBC: 8.8 10*3/uL (ref 4.0–10.5)

## 2014-12-02 LAB — BASIC METABOLIC PANEL
BUN: 30 mg/dL — ABNORMAL HIGH (ref 6–23)
CHLORIDE: 103 meq/L (ref 96–112)
CO2: 27 meq/L (ref 19–32)
Calcium: 9.6 mg/dL (ref 8.4–10.5)
Creatinine, Ser: 1.14 mg/dL (ref 0.40–1.20)
GFR: 47.33 mL/min — ABNORMAL LOW (ref >60.00)
GLUCOSE: 155 mg/dL — AB (ref 70–99)
Potassium: 4.7 mEq/L (ref 3.5–5.1)
SODIUM: 139 meq/L (ref 135–145)

## 2014-12-02 LAB — HEPATIC FUNCTION PANEL
ALBUMIN: 4 g/dL (ref 3.5–5.2)
ALT: 18 U/L (ref 0–35)
AST: 23 U/L (ref 0–37)
Alkaline Phosphatase: 37 U/L — ABNORMAL LOW (ref 39–117)
BILIRUBIN DIRECT: 0.1 mg/dL (ref 0.0–0.3)
Total Bilirubin: 0.3 mg/dL (ref 0.2–1.2)
Total Protein: 7.5 g/dL (ref 6.0–8.3)

## 2014-12-02 LAB — TSH: TSH: 2.37 u[IU]/mL (ref 0.35–4.50)

## 2014-12-02 NOTE — Telephone Encounter (Signed)
Informed pt that her remote appt is done w/ her home monitor. Pt verbalized understanding.

## 2014-12-05 NOTE — Addendum Note (Signed)
Addended by: Dorthula Rue L on: 12/05/2014 11:29 AM   Modules accepted: Orders

## 2014-12-08 NOTE — Progress Notes (Signed)
Loop recorder 

## 2014-12-09 ENCOUNTER — Other Ambulatory Visit: Payer: Self-pay | Admitting: Cardiology

## 2014-12-20 LAB — CUP PACEART REMOTE DEVICE CHECK: MDC IDC SESS DTM: 20161018163522

## 2014-12-20 NOTE — Progress Notes (Signed)
Carelink summary report received. Battery status OK. Normal device function. No new symptom episodes, tachy episodes, brady, or pause episodes. No new AF episodes. Monthly summary reports and ROV with GT in 12/2014 (recall letter sent).

## 2014-12-23 ENCOUNTER — Other Ambulatory Visit: Payer: Self-pay | Admitting: Family Medicine

## 2014-12-29 ENCOUNTER — Ambulatory Visit (INDEPENDENT_AMBULATORY_CARE_PROVIDER_SITE_OTHER): Payer: Medicare Other | Admitting: *Deleted

## 2014-12-29 DIAGNOSIS — I638 Other cerebral infarction: Secondary | ICD-10-CM

## 2014-12-29 DIAGNOSIS — I6389 Other cerebral infarction: Secondary | ICD-10-CM

## 2014-12-29 NOTE — Progress Notes (Signed)
Carelink Summary Report / Loop recorder 

## 2015-01-19 ENCOUNTER — Telehealth: Payer: Self-pay | Admitting: Family Medicine

## 2015-01-19 ENCOUNTER — Encounter: Payer: Self-pay | Admitting: *Deleted

## 2015-01-19 NOTE — Telephone Encounter (Signed)
Refill OK.  Try to reduce to one half tablet, if possible (given her age).  Would still refill for the 10 mg but if she is able to reduce that would be great.

## 2015-01-19 NOTE — Telephone Encounter (Signed)
Requesting: Ambien 10 mg #90 with refills -- okay to refill?  Last seen: 12/01/14 Last refill: 07/13/14

## 2015-01-19 NOTE — Telephone Encounter (Signed)
Pt needs refill on generic ambien 10 mg #90 w/[facility]ills [facility]

## 2015-01-20 ENCOUNTER — Other Ambulatory Visit: Payer: Self-pay | Admitting: *Deleted

## 2015-01-20 MED ORDER — ZOLPIDEM TARTRATE 10 MG PO TABS: 10.0000 mg | ORAL_TABLET | Freq: Every evening | ORAL | Status: DC | PRN

## 2015-01-20 NOTE — Telephone Encounter (Signed)
Rx faxed. Patient is aware.

## 2015-01-30 ENCOUNTER — Ambulatory Visit (INDEPENDENT_AMBULATORY_CARE_PROVIDER_SITE_OTHER): Payer: Medicare Other | Admitting: *Deleted

## 2015-01-30 DIAGNOSIS — I638 Other cerebral infarction: Secondary | ICD-10-CM | POA: Diagnosis not present

## 2015-01-30 DIAGNOSIS — I6389 Other cerebral infarction: Secondary | ICD-10-CM

## 2015-01-30 LAB — CUP PACEART REMOTE DEVICE CHECK: Date Time Interrogation Session: 20161117170609

## 2015-01-31 ENCOUNTER — Telehealth: Payer: Self-pay | Admitting: Family Medicine

## 2015-01-31 MED ORDER — ZOLPIDEM TARTRATE 10 MG PO TABS: 10.0000 mg | ORAL_TABLET | Freq: Every evening | ORAL | Status: DC | PRN

## 2015-01-31 NOTE — Telephone Encounter (Signed)
Ms. Rubley called saying she needs a refill of Ambien asap. She was told by [facility] that Dr. Elease Hashimoto didn't send them the refill. I told her according to her chart he sent the refill on 12.9.16. She said she needs at least six pills until she gets the mail delivery from [place]. Last night she took 2Tylenol and Gabapentin "just to get her to calm down." She doesn't want to have to do that while waiting on [facility]. She asked if the Ambien can be sent to [facility] on [address]. Please give her a call when/if you're able to send the Rx to [facility]. She'd prefer it go quickly so she can pick it up while the sun is out.  Pt's ph# [phone] Thank you.

## 2015-01-31 NOTE — Telephone Encounter (Signed)
Verbally called into pharmacy, #6, 0rf. I callled mailorder and her current RX is pending and had not been shipped yet.

## 2015-01-31 NOTE — Progress Notes (Signed)
Carelink Summary Report / Loop Recorder 

## 2015-01-31 NOTE — Telephone Encounter (Signed)
Correction [facility] [address]

## 2015-02-07 ENCOUNTER — Telehealth: Payer: Self-pay

## 2015-02-07 NOTE — Telephone Encounter (Signed)
PLEASE NOTE: All timestamps contained within this report are represented as [place] Standard Time. CONFIDENTIALTY NOTICE: This fax transmission is intended only for the addressee. It contains information that is legally privileged, confidential or otherwise protected from use or disclosure. If you are not the intended recipient, you are strictly prohibited from reviewing, disclosing, copying using or disseminating any of this information or taking any action in reliance on or regarding this information. If you have received this fax in error, please notify us immediately by telephone so that we can arrange for its return to [place]. Phone: [phone], Toll-Free: [phone], Fax: [phone] Page: 1 of 2 Call Id: [id number] [facility] - Client [place] Patient Name: [name] Gender: Female DOB: November 25, 1920 Age: 81 Y 70 M 7 D Return Phone Number: [phone] (Primary) Address: City/State/Zip: [place] [address] Client [facility] - Client Client Site [facility] Night Physician Carolann Littler Contact Type Call Call Type Triage / Clinical Relationship To Patient Self Return Phone Number [phone] (Primary) Chief Complaint Headache Initial Comment Caller states has a headache, stuffy nose. PreDisposition Call Doctor Nurse Assessment Nurse: Donovan Kail, RN, Barnetta Chapel Date/Time Eilene Ghazi Time): 02/06/2015 1:43:05 PM Confirm and document reason for call. If symptomatic, describe symptoms. ---Caller states has a headache, stuffy nose. Both of her children were here and their children have been very sick and went to see a Dr. Deborha Payment were put on antibiotics. Has the patient traveled out of the country within the last 30 days? ---Not Applicable Does the patient have any new or worsening symptoms? ---Yes Will a triage be completed? ---Yes Related visit to physician  within the last 2 weeks? ---No Does the PT have any chronic conditions? (i.e. diabetes, asthma, etc.) ---Yes List chronic conditions. ---diabetes, blood thinner, A fib, history of TIA, high cholesterol, Is this a behavioral health or substance abuse call? ---No Guidelines Guideline Title Affirmed Question Affirmed Notes Nurse Date/Time Eilene Ghazi Time) Sinus Pain or Congestion [1] Sinus congestion as part of a cold AND [2] present < 10 days (all triage questions negative) Donovan Kail, RN, Barnetta Chapel 02/06/2015 1:46:30 PM Disp. Time Eilene Ghazi Time) Disposition Final User 02/06/2015 1:55:53 PM See Physician within 24 Hours Yes Donovan Kail, RN, Barnetta Chapel Disposition Overriden: Home Care Override Reason: Patient's symptoms need a higher level of care Caller Understands: Yes Disagree/Comply: Comply PLEASE NOTE: All timestamps contained within this report are represented as [place] Standard Time. CONFIDENTIALTY NOTICE: This fax transmission is intended only for the addressee. It contains information that is legally privileged, confidential or otherwise protected from use or disclosure. If you are not the intended recipient, you are strictly prohibited from reviewing, disclosing, copying using or disseminating any of this information or taking any action in reliance on or regarding this information. If you have received this fax in error, please notify us immediately by telephone so that we can arrange for its return to [place]. Phone: [phone], Toll-Free: [phone], Fax: [phone] Page: 2 of 2 Call Id: [id number] Care Advice Given Per Guideline SEE PHYSICIAN WITHIN 24 HOURS: * IF OFFICE WILL BE OPEN: You need to be seen within the next 24 hours. Call your doctor when the office opens, and make an appointment. FOR A STUFFY NOSE - USE NASAL WASHES: * Introduction: Saline (salt water) nasal irrigation (nasal wash) is an effective and simple home remedy for treating stuffy nose and sinus congestion.  The nose can be irrigated by pouring, spraying, or squirting salt water into  the nose and then letting it run back out. * How it Helps: The salt water rinses out excess mucus, washes out any irritants (dust, allergens) that might be present, and moistens the nasal cavity. * Methods: There are several ways to perform nasal irrigation. You can use a saline nasal spray bottle (available over-the-counter), a rubber ear syringe, a medical syringe without the needle, or a NETI POT. STEP-BY-STEP INSTRUCTIONS: * STEP 1: Lean over a sink. * STEP 2: Gently squirt or spray warm salt water into one of your nostrils. * STEP 3: Some of the water may run into the back of your throat. Spit this out. If you swallow the salt water it will not hurt you. * STEP 4: Blow your nose to clean out the water and mucus. * STEP 5: Repeat steps 1-4 for the other nostril. You can do this a couple times a day if it seems to help you. * OXYMETAZOLINE NASAL DROPS (Afrin) are available OTC. Clean out the nose before using. Spray each nostril once, wait one minute for absorption, and then spray a second time. CALL BACK IF: * Difficulty breathing (and not relieved by cleaning out nose) * You become worse. After Care Instructions Given Call Event Type User Date / Time Description Referrals REFERRED TO PCP OFFICE

## 2015-02-27 ENCOUNTER — Ambulatory Visit (INDEPENDENT_AMBULATORY_CARE_PROVIDER_SITE_OTHER): Payer: Medicare Other | Admitting: *Deleted

## 2015-02-27 DIAGNOSIS — I638 Other cerebral infarction: Secondary | ICD-10-CM

## 2015-02-27 DIAGNOSIS — I6389 Other cerebral infarction: Secondary | ICD-10-CM

## 2015-03-03 NOTE — Progress Notes (Signed)
Carelink Summary Report / Loop Recorder 

## 2015-03-07 ENCOUNTER — Telehealth: Payer: Self-pay | Admitting: Cardiology

## 2015-03-07 NOTE — Telephone Encounter (Signed)
Pt states she has an appt with Dr Lovena Le 03/09/15, she did not want to schedule an additional appt at this time.

## 2015-03-07 NOTE — Telephone Encounter (Signed)
Needs appt with me as workin or PA soon.

## 2015-03-07 NOTE — Telephone Encounter (Signed)
Patient called c/o SOB and lowered stamina for 2 months and "stiff feet" from swelling for a few days. She has no CP, but is audibly SOB on the phone. She says her SOB is no change. She reports no weight gain, she st "it actually feels like I have lost weight." She has taken medications as directed. BP yesterday: 121/51 HR: 70 Confirmed OV with Dr. Lovena Le 1/26.  To Dr. Aundra Dubin for further recommendations.

## 2015-03-07 NOTE — Telephone Encounter (Signed)
Pt c/o swelling: STAT is pt has developed SOB within 24 hours  1. How long have you been experiencing swelling? unknown 2. Where is the swelling located? ankles 3.  Are you currently taking a "fluid pill"? no 4.  Are you currently SOB? yes  5.  Have you traveled recently? no

## 2015-03-08 ENCOUNTER — Encounter: Payer: Medicare Other | Admitting: Internal Medicine

## 2015-03-09 ENCOUNTER — Encounter: Payer: Self-pay | Admitting: Internal Medicine

## 2015-03-09 ENCOUNTER — Ambulatory Visit (INDEPENDENT_AMBULATORY_CARE_PROVIDER_SITE_OTHER): Payer: Medicare Other | Admitting: Internal Medicine

## 2015-03-09 VITALS — BP 132/48 | HR 72 | Ht 66.5 in | Wt 123.8 lb

## 2015-03-09 DIAGNOSIS — I10 Essential (primary) hypertension: Secondary | ICD-10-CM | POA: Diagnosis not present

## 2015-03-09 DIAGNOSIS — R0602 Shortness of breath: Secondary | ICD-10-CM | POA: Insufficient documentation

## 2015-03-09 DIAGNOSIS — R300 Dysuria: Secondary | ICD-10-CM

## 2015-03-09 DIAGNOSIS — I2510 Atherosclerotic heart disease of native coronary artery without angina pectoris: Secondary | ICD-10-CM

## 2015-03-09 DIAGNOSIS — R5383 Other fatigue: Secondary | ICD-10-CM | POA: Diagnosis not present

## 2015-03-09 DIAGNOSIS — R55 Syncope and collapse: Secondary | ICD-10-CM

## 2015-03-09 LAB — CBC WITH DIFFERENTIAL/PLATELET
BASOS ABS: 0 10*3/uL (ref 0.0–0.1)
Basophils Relative: 0 % (ref 0–1)
EOS ABS: 0.2 10*3/uL (ref 0.0–0.7)
EOS PCT: 2 % (ref 0–5)
HCT: 33.7 % — ABNORMAL LOW (ref 36.0–46.0)
Hemoglobin: 10.8 g/dL — ABNORMAL LOW (ref 12.0–15.0)
LYMPHS ABS: 3.2 10*3/uL (ref 0.7–4.0)
Lymphocytes Relative: 34 % (ref 12–46)
MCH: 30.9 pg (ref 26.0–34.0)
MCHC: 32 g/dL (ref 30.0–36.0)
MCV: 96.3 fL (ref 78.0–100.0)
MONOS PCT: 8 % (ref 3–12)
MPV: 9 fL (ref 8.6–12.4)
Monocytes Absolute: 0.8 10*3/uL (ref 0.1–1.0)
Neutro Abs: 5.3 10*3/uL (ref 1.7–7.7)
Neutrophils Relative %: 56 % (ref 43–77)
PLATELETS: 221 10*3/uL (ref 150–400)
RBC: 3.5 MIL/uL — ABNORMAL LOW (ref 3.87–5.11)
RDW: 13.6 % (ref 11.5–15.5)
WBC: 9.4 10*3/uL (ref 4.0–10.5)

## 2015-03-09 MED ORDER — FUROSEMIDE 20 MG PO TABS: 20.0000 mg | ORAL_TABLET | Freq: Every day | ORAL | Status: DC

## 2015-03-09 NOTE — Progress Notes (Signed)
HPI [name] returns for ongoing followup of syncope, s/p ILR. The patient is a very healthy appearing 82 yo woman, with a h/o unexplained syncope in the setting of LBBB. She also has a h/o TIAs which have not been well explained. She has preserved LV function. Since her ILR insertion, and over the past month, she notes worsening sob with exertion. When she wakes up in the morning, she feels weak and has trouble getting going. She notes an episode of peripheral edema. No chest pressure. Allergies  Allergen Reactions  . Doxycycline Hyclate     REACTION: upset stomach  . Nitrofurantoin     unknown  . Sulfamethoxazole-Trimethoprim     unknown  . Sulfonamide Derivatives     REACTION: rash     Current Outpatient Prescriptions  Medication Sig Dispense Refill  . Ascorbic Acid (VITAMIN C PO) Take 1 tablet by mouth daily.     Marland Kitchen aspirin 81 MG tablet Take 81 mg by mouth daily.      Marland Kitchen atorvastatin (LIPITOR) 20 MG tablet Take 1 tablet (20 mg total) by mouth daily. 90 tablet 1  . calcium carbonate 200 MG capsule Take 200 mg by mouth daily.     . Cholecalciferol (VITAMIN D PO) Take 1 tablet by mouth daily.     . clopidogrel (PLAVIX) 75 MG tablet Take 75 mg by mouth daily.    . Cyanocobalamin (VITAMIN B 12 PO) Take 1 tablet by mouth daily.     . fish oil-omega-3 fatty acids 1000 MG capsule Take 1 g by mouth daily.     Marland Kitchen glucose blood test strip Use once daily 100 each 3  . metoprolol tartrate (LOPRESSOR) 25 MG tablet Take 12.5 mg by mouth 2 (two) times daily.    . Multiple Vitamins-Minerals (MULTIVITAMIN WITH MINERALS) tablet Take 1 tablet by mouth daily.    . Multiple Vitamins-Minerals (OCUVITE ADULT 50+ PO) Take 1 tablet by mouth daily.     . ONE TOUCH LANCETS MISC Use daily as directed 200 each 3  . pantoprazole (PROTONIX) 20 MG tablet Take 20 mg by mouth daily as needed for heartburn or indigestion.     Marland Kitchen VITAMIN E PO Take 1 tablet by mouth daily.     Marland Kitchen zolpidem (AMBIEN) 10 MG  tablet Take 1 tablet (10 mg total) by mouth at bedtime as needed (TRY TO REDUCE TO HALF TABLET, IF POSSIBLE. #6 verbally called into [facility] pharmacy due to pt waiting on mailorder.). For sleep 6 tablet 0   No current facility-administered medications for this visit.     Past Medical History  Diagnosis Date  . COLONIC POLYPS, ADENOMATOUS 12/26/2009  . DIABETES MELLITUS, TYPE II 08/11/2006  . HYPERLIPIDEMIA 09/09/2006  . ANEMIA, NORMOCYTIC 11/30/2009  . HYPERTENSION 09/09/2006  . CAD, NATIVE VESSEL 04/27/2008    Cardiac catheterization 6/08: EF 55%, 1+ MR, Dx 50%, LAD 50%, dLAD 40%, OM1 50-60% (up to 70%), pOM 30%, pRCA 40%, mRCA 50-60%, pPDA 50-60% (up to 70%).  Medical therapy was recommended  . LEFT BUNDLE BRANCH BLOCK 08/11/2006  . Atrial flutter ([place]) 04/12/2009  . TIA 09/02/2006    Echocardiogram 6/12: Mild to moderate MR, trace AI, trace TR, PASP 34, bubble study negative for intracardiac shunting, normal EF (greater than 55%)  . HEMORRHOIDS-INTERNAL 12/26/2009  . GERD 02/16/2007  . DIVERTICULOSIS, COLON 08/11/2006  . OSTEOARTHRITIS 08/11/2006  . OSTEOPOROSIS 08/11/2006  . Carotid stenosis     dopplers [id number]: LICA [id number]  .  Hemorrhoids   . Stroke ([place]) 12-06-13    ROS:   All systems reviewed and negative except as noted in the HPI.   Past Surgical History  Procedure Laterality Date  . Tonsillectomy    . Eye surgery      catarac  . Closed reduction metatarsal fracture      right 5th  . Colonoscopy w/ polypectomy  2001  . Carotid endarterectomy Left 12-05-2010  . Loop recorder implant N/A 01/03/2014    Procedure: LOOP RECORDER IMPLANT;  Surgeon: Evans Lance, MD;  Location: [facility] CATH LAB;  Service: Cardiovascular;  Laterality: N/A;     Family History  Problem Relation Age of Onset  . Heart disease Mother 53  . Kidney disease Mother   . Heart attack Mother   . Congestive Heart Failure Father 39  . Breast cancer Sister   . Colon cancer Brother   . Heart disease Brother     . Uterine cancer Sister      Social History   Social History  . Marital Status: Widowed    Spouse Name: N/A  . Number of Children: 3  . Years of Education: 12   Occupational History  .     Social History Main Topics  . Smoking status: Never Smoker   . Smokeless tobacco: Never Used  . Alcohol Use: No  . Drug Use: No  . Sexual Activity: Not on file   Other Topics Concern  . Not on file   Social History Narrative   Patient is a widow and lives alone.   Patient has three adult children.   Patient is retired.   Patient has a high school.   Patient is right-handed.   Patient does not drink any caffeine.     BP 132/48 mmHg  Pulse 72  Ht 5' 6.5" (1.689 m)  Wt 123 lb 12.8 oz (56.155 kg)  BMI 19.68 kg/m2  Physical Exam:  Well appearing 82 yo woman, NAD HEENT: Unremarkable Neck:  6 cm JVD, no thyromegally Back:  No CVA tenderness Lungs:  Clear with no wheezes HEART:  Regular rate rhythm, no murmurs, no rubs, no clicks Abd:  soft, positive bowel sounds, no organomegally, no rebound, no guarding Ext:  2 plus pulses, no edema, no cyanosis, no clubbing Skin:  No rashes no nodules Neuro:  CN II through XII intact, motor grossly intact  ILR - no brady or atrial fib   Assess/Plan:

## 2015-03-09 NOTE — Assessment & Plan Note (Signed)
Her blood pressure has been well controlled. Will follow.  

## 2015-03-09 NOTE — Patient Instructions (Addendum)
Medication Instructions:  Your physician has recommended you make the following change in your medication:  1) Start Lasix 20 mg once daily   Labwork: Your physician recommends that you return for lab work today: CBC, BMP, urinalysis     Testing/Procedures: None ordered   Follow-Up: Your physician wants you to follow-up in: 3-4 weeks with Dr. Aundra Dubin and in 12 months with Dr Lovena Le. You will receive a reminder letter in the mail two months in advance. If you don't receive a letter, please call our office to schedule the follow-up appointment.     Any Other Special Instructions Will Be Listed Below (If Applicable).     If you need a refill on your cardiac medications before your next appointment, please call your pharmacy.

## 2015-03-09 NOTE — Assessment & Plan Note (Signed)
The patient's sob is unclear. I suspect diastolic dysfunction but will obtain labs. She will start low dose lasix.

## 2015-03-09 NOTE — Assessment & Plan Note (Signed)
Her syncope remains unclear. Her ILR is unrevealing. Will follow.

## 2015-03-09 NOTE — Assessment & Plan Note (Signed)
She denies angina. I will refer her back to Dr. Aundra Dubin.

## 2015-03-09 NOTE — Addendum Note (Signed)
Addended by: Jordan Likes on: 03/09/2015 02:06 PM   Modules accepted: Orders

## 2015-03-10 ENCOUNTER — Encounter: Payer: Self-pay | Admitting: Family Medicine

## 2015-03-10 ENCOUNTER — Ambulatory Visit (INDEPENDENT_AMBULATORY_CARE_PROVIDER_SITE_OTHER): Payer: Medicare Other | Admitting: Family Medicine

## 2015-03-10 ENCOUNTER — Other Ambulatory Visit: Payer: Self-pay | Admitting: Family Medicine

## 2015-03-10 VITALS — BP 120/58 | HR 77 | Temp 97.4°F | Ht 66.5 in | Wt 121.9 lb

## 2015-03-10 DIAGNOSIS — M545 Low back pain, unspecified: Secondary | ICD-10-CM

## 2015-03-10 DIAGNOSIS — R634 Abnormal weight loss: Secondary | ICD-10-CM | POA: Diagnosis not present

## 2015-03-10 DIAGNOSIS — R5383 Other fatigue: Secondary | ICD-10-CM

## 2015-03-10 LAB — URINALYSIS, MICROSCOPIC ONLY: Yeast: NONE SEEN [HPF]

## 2015-03-10 LAB — COMPREHENSIVE METABOLIC PANEL
ALBUMIN: 3.8 g/dL (ref 3.5–5.2)
ALK PHOS: 37 U/L — AB (ref 39–117)
ALT: 20 U/L (ref 0–35)
AST: 26 U/L (ref 0–37)
BILIRUBIN TOTAL: 0.5 mg/dL (ref 0.2–1.2)
BUN: 26 mg/dL — ABNORMAL HIGH (ref 6–23)
CALCIUM: 9.1 mg/dL (ref 8.4–10.5)
CO2: 28 meq/L (ref 19–32)
CREATININE: 1.02 mg/dL (ref 0.40–1.20)
Chloride: 104 mEq/L (ref 96–112)
GFR: 53.78 mL/min — ABNORMAL LOW (ref >60.00)
Glucose, Bld: 156 mg/dL — ABNORMAL HIGH (ref 70–99)
Potassium: 4.9 mEq/L (ref 3.5–5.1)
Sodium: 138 mEq/L (ref 135–145)
TOTAL PROTEIN: 7 g/dL (ref 6.0–8.3)

## 2015-03-10 LAB — CUP PACEART INCLINIC DEVICE CHECK: Date Time Interrogation Session: 20170126165327

## 2015-03-10 LAB — URINALYSIS, ROUTINE W REFLEX MICROSCOPIC
BILIRUBIN URINE: NEGATIVE
GLUCOSE, UA: NEGATIVE
HGB URINE DIPSTICK: NEGATIVE
KETONES UR: NEGATIVE
Nitrite: NEGATIVE
Specific Gravity, Urine: 1.016 (ref 1.001–1.035)
pH: 5 (ref 5.0–8.0)

## 2015-03-10 LAB — SEDIMENTATION RATE: SED RATE: 71 mm/h — AB (ref 0–22)

## 2015-03-10 NOTE — Progress Notes (Signed)
Pre visit review using our clinic review tool, if applicable. No additional management support is needed unless otherwise documented below in the visit note. 

## 2015-03-10 NOTE — Progress Notes (Signed)
Subjective:    Patient ID: [name], female    DOB: 1920-10-04, 82 y.o.   MRN: [id number]  HPI Patient has multiple chronic medical problems-history of chronic normocytic anemia, atrial flutter, CAD, chronic insomnia, hypertension, GERD, hyperlipidemia, osteoarthritis, osteoporosis, type 2 diabetes. She just saw  cardiologist yesterday and had CBC with hemoglobin 10.8 and urinalysis which is fairly nonspecific.  Major complaint is increased fatigue over several months. She's had some decreased appetite with only very modest weight loss. Also complaining of some right-sided lower back pain for the past few weeks. Denies any radiculopathy symptoms. No fevers or chills. No burning with urination. No recent falls  Had chemistries including TSH last fall that were unremarkable. She's not had any recent chest pains. She feels her mood is stable. She has chronic insomnia and takes Ambien for that and usually gets about 7 hours sleep per night. No changes sleep patterns recently. She's had some mild dyspnea with exertion and cardiologist added furosemide yesterday which has not yet taken. No recent increased peripheral edema.  Denies headaches, abdominal pain, cough, orthopnea  Past Medical History  Diagnosis Date  . COLONIC POLYPS, ADENOMATOUS 12/26/2009  . DIABETES MELLITUS, TYPE II 08/11/2006  . HYPERLIPIDEMIA 09/09/2006  . ANEMIA, NORMOCYTIC 11/30/2009  . HYPERTENSION 09/09/2006  . CAD, NATIVE VESSEL 04/27/2008    Cardiac catheterization 6/08: EF 55%, 1+ MR, Dx 50%, LAD 50%, dLAD 40%, OM1 50-60% (up to 70%), pOM 30%, pRCA 40%, mRCA 50-60%, pPDA 50-60% (up to 70%).  Medical therapy was recommended  . LEFT BUNDLE BRANCH BLOCK 08/11/2006  . Atrial flutter ([place]) 04/12/2009  . TIA 09/02/2006    Echocardiogram 6/12: Mild to moderate MR, trace AI, trace TR, PASP 34, bubble study negative for intracardiac shunting, normal EF (greater than 55%)  . HEMORRHOIDS-INTERNAL 12/26/2009  . GERD 02/16/2007  .  DIVERTICULOSIS, COLON 08/11/2006  . OSTEOARTHRITIS 08/11/2006  . OSTEOPOROSIS 08/11/2006  . Carotid stenosis     dopplers [id number]: LICA [id number]  . Hemorrhoids   . Stroke [facility]) 12-06-13   Past Surgical History  Procedure Laterality Date  . Tonsillectomy    . Eye surgery      catarac  . Closed reduction metatarsal fracture      right 5th  . Colonoscopy w/ polypectomy  2001  . Carotid endarterectomy Left 12-05-2010  . Loop recorder implant N/A 01/03/2014    Procedure: LOOP RECORDER IMPLANT;  Surgeon: Evans Lance, MD;  Location: [facility] CATH LAB;  Service: Cardiovascular;  Laterality: N/A;    reports that she has never smoked. She has never used smokeless tobacco. She reports that she does not drink alcohol or use illicit drugs. family history includes Breast cancer in her sister; Colon cancer in her brother; Congestive Heart Failure (age of onset: 64) in her father; Heart attack in her mother; Heart disease in her brother; Heart disease (age of onset: 110) in her mother; Kidney disease in her mother; Uterine cancer in her sister. Allergies  Allergen Reactions  . Doxycycline Hyclate     REACTION: upset stomach  . Nitrofurantoin     unknown  . Sulfamethoxazole-Trimethoprim     unknown  . Sulfonamide Derivatives     REACTION: rash      Review of Systems  Constitutional: Positive for appetite change, fatigue and unexpected weight change. Negative for fever and chills.  HENT: Negative for trouble swallowing.   Eyes: Negative for visual disturbance.  Respiratory: Negative for cough and shortness of breath.   Cardiovascular:  Negative for chest pain, palpitations and leg swelling.  Gastrointestinal: Negative for nausea, vomiting, abdominal pain and blood in stool.  Genitourinary: Negative for hematuria.  Musculoskeletal: Positive for back pain.  Neurological: Negative for dizziness, seizures and headaches.       Objective:   Physical Exam  Constitutional: She appears well-developed and  well-nourished.  HENT:  Mouth/Throat: Oropharynx is clear and moist.  Neck: Neck supple. No thyromegaly present.  Cardiovascular: Normal rate and regular rhythm.   Pulmonary/Chest: Effort normal and breath sounds normal. No respiratory distress. She has no wheezes. She has no rales.  Abdominal: Soft. Bowel sounds are normal. She exhibits no distension. There is no tenderness. There is no rebound.  Musculoskeletal: She exhibits no edema.  Neurological: She is alert.  Skin: No rash noted.          Assessment & Plan:  82 year old female with multiple chronic problems as above. She presents with nonspecific malaise, modest weight loss, and couple week history of some nonspecific right lumbar back pain. She had CBC yesterday as above. Check comprehensive metabolic panel, sedimentation rate, SPEP, UPEP. We discussed possible abdominal imaging with CT abdomen pelvis if she had continued appetite or weight changes. She already has scheduled follow-up in one month and reassess weight then

## 2015-03-10 NOTE — Telephone Encounter (Signed)
See office visit Dr Lovena Le 03/09/15

## 2015-03-13 ENCOUNTER — Telehealth: Payer: Self-pay | Admitting: Cardiology

## 2015-03-13 NOTE — Telephone Encounter (Signed)
Pt c/o Shortness Of Breath: STAT if SOB developed within the last 24 hours or pt is noticeably SOB on the phone  1. Are you currently SOB (can you hear that pt is SOB on the phone)? Pt stated she is currently 2. How long have you been experiencing SOB? Couple of weeks  3. Are you SOB when sitting or when up moving around? both  4. Are you currently experiencing any other symptoms? no

## 2015-03-13 NOTE — Telephone Encounter (Signed)
Pt states she just doesn't feel good, she does have some shortness of breath with exertion. Pt states she saw Dr Lovena Le last week and he started her on lasix 20mg  daily. Pt states she started lasix yesterday and has not noticed a change in her symptoms.  Pt states she has lost weight, denies LE edema. Pt states she saw also saw Dr Elease Hashimoto last week, pt states she was not given a clear reason for her symptoms.   Pt scheduled to see Dr Aundra Dubin 03/16/15, I offered pt PA appt sooner, she prefers to see Dr Aundra Dubin. Pt advised to avoid physical exertion/activity that produces symptoms until her appt with Dr Aundra Dubin.  Pt verbalized understanding.

## 2015-03-15 ENCOUNTER — Emergency Department (HOSPITAL_COMMUNITY): Payer: Medicare Other

## 2015-03-15 ENCOUNTER — Encounter (HOSPITAL_COMMUNITY): Payer: Self-pay | Admitting: Emergency Medicine

## 2015-03-15 ENCOUNTER — Observation Stay (HOSPITAL_COMMUNITY)
Admission: EM | Admit: 2015-03-15 | Discharge: 2015-03-17 | Disposition: A | Discharge status: Home / Self Care | Payer: Medicare Other | Attending: Family Medicine | Admitting: Family Medicine

## 2015-03-15 ENCOUNTER — Telehealth: Payer: Self-pay | Admitting: Family Medicine

## 2015-03-15 DIAGNOSIS — E785 Hyperlipidemia, unspecified: Secondary | ICD-10-CM | POA: Diagnosis not present

## 2015-03-15 DIAGNOSIS — Z79899 Other long term (current) drug therapy: Secondary | ICD-10-CM | POA: Diagnosis not present

## 2015-03-15 DIAGNOSIS — I10 Essential (primary) hypertension: Secondary | ICD-10-CM | POA: Diagnosis not present

## 2015-03-15 DIAGNOSIS — Z7902 Long term (current) use of antithrombotics/antiplatelets: Secondary | ICD-10-CM | POA: Insufficient documentation

## 2015-03-15 DIAGNOSIS — R0602 Shortness of breath: Secondary | ICD-10-CM | POA: Diagnosis not present

## 2015-03-15 DIAGNOSIS — E119 Type 2 diabetes mellitus without complications: Secondary | ICD-10-CM

## 2015-03-15 DIAGNOSIS — I447 Left bundle-branch block, unspecified: Secondary | ICD-10-CM | POA: Insufficient documentation

## 2015-03-15 DIAGNOSIS — K648 Other hemorrhoids: Secondary | ICD-10-CM | POA: Insufficient documentation

## 2015-03-15 DIAGNOSIS — Z7982 Long term (current) use of aspirin: Secondary | ICD-10-CM | POA: Diagnosis not present

## 2015-03-15 DIAGNOSIS — R531 Weakness: Principal | ICD-10-CM | POA: Insufficient documentation

## 2015-03-15 DIAGNOSIS — I2510 Atherosclerotic heart disease of native coronary artery without angina pectoris: Secondary | ICD-10-CM | POA: Diagnosis not present

## 2015-03-15 DIAGNOSIS — M1990 Unspecified osteoarthritis, unspecified site: Secondary | ICD-10-CM | POA: Diagnosis not present

## 2015-03-15 DIAGNOSIS — Z8673 Personal history of transient ischemic attack (TIA), and cerebral infarction without residual deficits: Secondary | ICD-10-CM | POA: Diagnosis not present

## 2015-03-15 DIAGNOSIS — K649 Unspecified hemorrhoids: Secondary | ICD-10-CM | POA: Diagnosis not present

## 2015-03-15 DIAGNOSIS — K210 Gastro-esophageal reflux disease with esophagitis: Secondary | ICD-10-CM | POA: Diagnosis not present

## 2015-03-15 DIAGNOSIS — D649 Anemia, unspecified: Secondary | ICD-10-CM | POA: Insufficient documentation

## 2015-03-15 DIAGNOSIS — E118 Type 2 diabetes mellitus with unspecified complications: Secondary | ICD-10-CM

## 2015-03-15 DIAGNOSIS — R0600 Dyspnea, unspecified: Secondary | ICD-10-CM | POA: Diagnosis present

## 2015-03-15 DIAGNOSIS — M810 Age-related osteoporosis without current pathological fracture: Secondary | ICD-10-CM | POA: Insufficient documentation

## 2015-03-15 DIAGNOSIS — I4892 Unspecified atrial flutter: Secondary | ICD-10-CM | POA: Diagnosis not present

## 2015-03-15 DIAGNOSIS — K219 Gastro-esophageal reflux disease without esophagitis: Secondary | ICD-10-CM | POA: Diagnosis not present

## 2015-03-15 DIAGNOSIS — R5383 Other fatigue: Secondary | ICD-10-CM | POA: Diagnosis present

## 2015-03-15 DIAGNOSIS — I6529 Occlusion and stenosis of unspecified carotid artery: Secondary | ICD-10-CM | POA: Diagnosis not present

## 2015-03-15 DIAGNOSIS — I480 Paroxysmal atrial fibrillation: Secondary | ICD-10-CM | POA: Diagnosis present

## 2015-03-15 LAB — URINE MICROSCOPIC-ADD ON: RBC / HPF: NONE SEEN RBC/hpf (ref 0–5)

## 2015-03-15 LAB — CBC
HCT: 35.9 % — ABNORMAL LOW (ref 36.0–46.0)
HCT: 34.7 % — ABNORMAL LOW (ref 36.0–46.0)
Hemoglobin: 12 g/dL (ref 12.0–15.0)
Hemoglobin: 11.6 g/dL — ABNORMAL LOW (ref 12.0–15.0)
MCH: 32.3 pg (ref 26.0–34.0)
MCH: 32.1 pg (ref 26.0–34.0)
MCHC: 33.4 g/dL (ref 30.0–36.0)
MCHC: 33.4 g/dL (ref 30.0–36.0)
MCV: 96.8 fL (ref 78.0–100.0)
MCV: 96.1 fL (ref 78.0–100.0)
PLATELETS: 233 10*3/uL (ref 150–400)
Platelets: 221 10*3/uL (ref 150–400)
RBC: 3.71 MIL/uL — ABNORMAL LOW (ref 3.87–5.11)
RBC: 3.61 MIL/uL — ABNORMAL LOW (ref 3.87–5.11)
RDW: 13.7 % (ref 11.5–15.5)
RDW: 13.6 % (ref 11.5–15.5)
WBC: 6.9 10*3/uL (ref 4.0–10.5)
WBC: 7.9 10*3/uL (ref 4.0–10.5)

## 2015-03-15 LAB — BASIC METABOLIC PANEL
Anion gap: 12 (ref 5–15)
BUN: 19 mg/dL (ref 6–20)
CO2: 26 mmol/L (ref 22–32)
CREATININE: 1 mg/dL (ref 0.44–1.00)
Calcium: 9.2 mg/dL (ref 8.9–10.3)
Chloride: 99 mmol/L — ABNORMAL LOW (ref 101–111)
GFR, EST AFRICAN AMERICAN: 55 mL/min — AB (ref >60)
GFR, EST NON AFRICAN AMERICAN: 47 mL/min — AB (ref >60)
Glucose, Bld: 212 mg/dL — ABNORMAL HIGH (ref 65–99)
POTASSIUM: 4.5 mmol/L (ref 3.5–5.1)
SODIUM: 137 mmol/L (ref 135–145)

## 2015-03-15 LAB — PROTEIN ELECTROPHORESIS,RANDOM URN
CREATININE, URINE: 121 mg/dL (ref 20–320)
PROTEIN CREATININE RATIO: 240 mg/g{creat} — AB (ref 21–161)
Total Protein, Urine: 29 mg/dL — ABNORMAL HIGH (ref 5–24)

## 2015-03-15 LAB — URINALYSIS, ROUTINE W REFLEX MICROSCOPIC
BILIRUBIN URINE: NEGATIVE
GLUCOSE, UA: NEGATIVE mg/dL
HGB URINE DIPSTICK: NEGATIVE
KETONES UR: NEGATIVE mg/dL
Nitrite: NEGATIVE
PROTEIN: NEGATIVE mg/dL
Specific Gravity, Urine: 1.008 (ref 1.005–1.030)
pH: 5 (ref 5.0–8.0)

## 2015-03-15 LAB — CREATININE, SERUM
CREATININE: 0.89 mg/dL (ref 0.44–1.00)
GFR calc Af Amer: >60 mL/min (ref >60)
GFR, EST NON AFRICAN AMERICAN: 55 mL/min — AB (ref >60)

## 2015-03-15 LAB — GLUCOSE, CAPILLARY: GLUCOSE-CAPILLARY: 118 mg/dL — AB (ref 65–99)

## 2015-03-15 LAB — I-STAT TROPONIN, ED: Troponin i, poc: 0 ng/mL (ref 0.00–0.08)

## 2015-03-15 LAB — BRAIN NATRIURETIC PEPTIDE: B NATRIURETIC PEPTIDE 5: 141.1 pg/mL — AB (ref 0.0–100.0)

## 2015-03-15 LAB — IMMUNOFIXATION INTE

## 2015-03-15 LAB — TROPONIN I

## 2015-03-15 LAB — TSH: TSH: 5.736 u[IU]/mL — AB (ref 0.350–4.500)

## 2015-03-15 LAB — D-DIMER, QUANTITATIVE: D-Dimer, Quant: 0.63 ug/mL-FEU — ABNORMAL HIGH (ref 0.00–0.50)

## 2015-03-15 MED ORDER — ATORVASTATIN CALCIUM 20 MG PO TABS
20.0000 mg | ORAL_TABLET | Freq: Every day | ORAL | Status: DC
  Administered 2015-03-16: 20 mg via ORAL
  Filled 2015-03-15: qty 1

## 2015-03-15 MED ORDER — ACETAMINOPHEN 325 MG PO TABS: 650.0000 mg | ORAL_TABLET | ORAL | Status: DC | PRN

## 2015-03-15 MED ORDER — PANTOPRAZOLE SODIUM 20 MG PO TBEC
20.0000 mg | DELAYED_RELEASE_TABLET | Freq: Every day | ORAL | Status: DC
  Administered 2015-03-16: 20 mg via ORAL
  Filled 2015-03-15 (×2): qty 1

## 2015-03-15 MED ORDER — LOSARTAN POTASSIUM 25 MG PO TABS
25.0000 mg | ORAL_TABLET | Freq: Every day | ORAL | Status: DC
  Administered 2015-03-16 – 2015-03-17 (×2): 25 mg via ORAL
  Filled 2015-03-15 (×2): qty 1

## 2015-03-15 MED ORDER — ENOXAPARIN SODIUM 40 MG/0.4ML ~~LOC~~ SOLN
40.0000 mg | SUBCUTANEOUS | Status: DC
  Administered 2015-03-16: 40 mg via SUBCUTANEOUS
  Filled 2015-03-15: qty 0.4

## 2015-03-15 MED ORDER — FUROSEMIDE 10 MG/ML IJ SOLN
20.0000 mg | Freq: Every day | INTRAMUSCULAR | Status: DC
  Administered 2015-03-15 – 2015-03-17 (×3): 20 mg via INTRAVENOUS
  Filled 2015-03-15 (×3): qty 2

## 2015-03-15 MED ORDER — SODIUM CHLORIDE 0.9% FLUSH: 3.0000 mL | INTRAVENOUS | Status: DC | PRN

## 2015-03-15 MED ORDER — GABAPENTIN 100 MG PO CAPS
100.0000 mg | ORAL_CAPSULE | Freq: Two times a day (BID) | ORAL | Status: DC
  Administered 2015-03-16 (×2): 100 mg via ORAL
  Filled 2015-03-15 (×3): qty 1

## 2015-03-15 MED ORDER — MULTI-VITAMIN/MINERALS PO TABS: 1.0000 | ORAL_TABLET | Freq: Every day | ORAL | Status: DC

## 2015-03-15 MED ORDER — CALCIUM CARBONATE 200 MG PO CAPS: 200.0000 mg | ORAL_CAPSULE | Freq: Every day | ORAL | Status: DC

## 2015-03-15 MED ORDER — ASPIRIN EC 81 MG PO TBEC
81.0000 mg | DELAYED_RELEASE_TABLET | Freq: Every day | ORAL | Status: DC
  Administered 2015-03-15: 81 mg via ORAL
  Filled 2015-03-15: qty 1

## 2015-03-15 MED ORDER — SODIUM CHLORIDE 0.9 % IV BOLUS (SEPSIS): 1000.0000 mL | Freq: Once | INTRAVENOUS | Status: DC

## 2015-03-15 MED ORDER — SODIUM CHLORIDE 0.9 % IV SOLN: 250.0000 mL | INTRAVENOUS | Status: DC | PRN

## 2015-03-15 MED ORDER — IPRATROPIUM-ALBUTEROL 0.5-2.5 (3) MG/3ML IN SOLN
3.0000 mL | Freq: Four times a day (QID) | RESPIRATORY_TRACT | Status: DC
  Administered 2015-03-15 – 2015-03-16 (×3): 3 mL via RESPIRATORY_TRACT
  Filled 2015-03-15 (×3): qty 3

## 2015-03-15 MED ORDER — METOPROLOL TARTRATE 12.5 MG HALF TABLET
12.5000 mg | ORAL_TABLET | Freq: Two times a day (BID) | ORAL | Status: DC
  Administered 2015-03-15 – 2015-03-17 (×4): 12.5 mg via ORAL
  Filled 2015-03-15 (×4): qty 1

## 2015-03-15 MED ORDER — ZOLPIDEM TARTRATE 5 MG PO TABS
10.0000 mg | ORAL_TABLET | Freq: Every evening | ORAL | Status: DC | PRN
  Administered 2015-03-15 – 2015-03-16 (×2): 10 mg via ORAL
  Filled 2015-03-15 (×2): qty 2

## 2015-03-15 MED ORDER — ADULT MULTIVITAMIN W/MINERALS CH
1.0000 | ORAL_TABLET | Freq: Every day | ORAL | Status: DC
  Administered 2015-03-16 – 2015-03-17 (×2): 1 via ORAL
  Filled 2015-03-15 (×2): qty 1

## 2015-03-15 MED ORDER — ZOLPIDEM TARTRATE 5 MG PO TABS: 5.0000 mg | ORAL_TABLET | Freq: Every evening | ORAL | Status: DC | PRN

## 2015-03-15 MED ORDER — ONDANSETRON HCL 4 MG/2ML IJ SOLN: 4.0000 mg | Freq: Four times a day (QID) | INTRAMUSCULAR | Status: DC | PRN

## 2015-03-15 MED ORDER — CLOPIDOGREL BISULFATE 75 MG PO TABS
75.0000 mg | ORAL_TABLET | Freq: Every day | ORAL | Status: DC
  Filled 2015-03-15: qty 1

## 2015-03-15 MED ORDER — INSULIN ASPART 100 UNIT/ML ~~LOC~~ SOLN: 0.0000 [IU] | Freq: Three times a day (TID) | SUBCUTANEOUS | Status: DC

## 2015-03-15 MED ORDER — CALCIUM CARBONATE 1250 (500 CA) MG PO TABS
1.0000 | ORAL_TABLET | Freq: Every day | ORAL | Status: DC
  Administered 2015-03-16 – 2015-03-17 (×2): 500 mg via ORAL
  Filled 2015-03-15 (×2): qty 1

## 2015-03-15 MED ORDER — INSULIN ASPART 100 UNIT/ML ~~LOC~~ SOLN: 0.0000 [IU] | Freq: Every day | SUBCUTANEOUS | Status: DC

## 2015-03-15 MED ORDER — SODIUM CHLORIDE 0.9 % IV SOLN: Freq: Once | INTRAVENOUS | Status: DC

## 2015-03-15 MED ORDER — SODIUM CHLORIDE 0.9% FLUSH
3.0000 mL | Freq: Two times a day (BID) | INTRAVENOUS | Status: DC
Start: 2015-03-15 — End: 2015-03-17
  Administered 2015-03-15 – 2015-03-17 (×4): 3 mL via INTRAVENOUS

## 2015-03-15 NOTE — Telephone Encounter (Signed)
Patient Name: [name] DOB: 07-11-1920 Initial Comment caller states she received a call from the office re a problem she has - wants to speak to the office - she started on lasix on Sunday - she does not feel well - she feels dry Nurse Assessment Nurse: Vallery Sa, RN, [place] Date/Time ([place] Time): 03/15/2015 10:05:03 AM Confirm and document reason for call. If symptomatic, describe symptoms. You must click the next button to save text entered. ---Caller states she developed weakness several months that is worse today. She developed shortness of breath yesterday. No chest pain. No injury in the past 3 days. Has the patient traveled out of the country within the last 30 days? ---No Does the patient have any new or worsening symptoms? ---Yes Will a triage be completed? ---Yes Related visit to physician within the last 2 weeks? ---Yes Does the PT have any chronic conditions? (i.e. diabetes, asthma, etc.) ---Yes List chronic conditions. ---TIAs in the past, Diabetes, [place] problems in the past Is this a behavioral health or substance abuse call? ---No Guidelines Guideline Title Affirmed Question Affirmed Notes Breathing Difficulty [1] MODERATE difficulty breathing (e.g., speaks in phrases, SOB even at rest, pulse 100-120) AND [2] NEW-onset or WORSE than normal Final Disposition User Go to ED Now Vallery Sa, RN, [facility] - ED Disagree/Comply: Comply

## 2015-03-15 NOTE — H&P (Signed)
History and Physical        Hospital Admission Note Date: 03/15/2015  Patient name: [name] Medical record number: [id number] Date of birth: March 24, 1920 Age: 82 y.o. Gender: female  PCP: Eulas Post, MD  Referring physician: Alisia Ferrari  Chief Complaint:  Shortness of breath  HPI: Patient is a 82 year old female with diabetes, hypertension, CAD, atrial flutter, prior history of TIA presented to ED with multiple complaints, mainly shortness of breath. History was obtained from the patient. Patient reported that she had noticed shortness of breath with exertion in the last few months and worsening in the last 1 week. She denied any chest pain, fevers, chills, any productive cough. Patient had seen Dr. Lovena Le on 03/09/15 and was started on Lasix 20 mg daily however patient has not taken Lasix in the last 2-3 days stating that she felt "dry" and numbness in her fingers. Patient also reports that when she wakes up in the morning, she feels generalized weakness. She has noticed peripheral edema but no abdominal distention or weight gain. She feels coughing at night and otherwise no orthopnea. Patient also reports decreased urine output today with some foul-smelling urine.  ED workup showed unremarkable CBC, BMET, elevated BNP 141, troponin 0.0, UA is still pending Chest x-ray showed emphysema without acute cardiopulmonary process, subtle nodular density in the peripheral left lung.  Review of Systems:  Constitutional: Denies fever, chills, diaphoresis, poor appetite and fatigue.  HEENT: Denies photophobia, eye pain, redness, hearing loss, ear pain, congestion, sore throat, rhinorrhea, sneezing, mouth sores, trouble swallowing, neck pain, neck stiffness and tinnitus.   Respiratory: Please see history of present illness Cardiovascular: Denies chest pain, palpitations and leg  swelling.  Gastrointestinal: Denies nausea, vomiting, abdominal pain, diarrhea, constipation, blood in stool and abdominal distention.  Genitourinary: Denies dysuria, urgency, frequency, hematuria, flank pain and difficulty urinating.  Musculoskeletal: Denies myalgias, back pain, joint swelling, arthralgias and gait problem.  Skin: Denies pallor, rash and wound.  Neurological: Denies dizziness, seizures, syncope, light-headedness, numbness and headaches. + generalized weakness Hematological: Denies adenopathy. Easy bruising, personal or family bleeding history  Psychiatric/Behavioral: Denies suicidal ideation, mood changes, confusion, nervousness, sleep disturbance and agitation  Past Medical History: Past Medical History  Diagnosis Date  . COLONIC POLYPS, ADENOMATOUS 12/26/2009  . DIABETES MELLITUS, TYPE II 08/11/2006  . HYPERLIPIDEMIA 09/09/2006  . ANEMIA, NORMOCYTIC 11/30/2009  . HYPERTENSION 09/09/2006  . CAD, NATIVE VESSEL 04/27/2008    Cardiac catheterization 6/08: EF 55%, 1+ MR, Dx 50%, LAD 50%, dLAD 40%, OM1 50-60% (up to 70%), pOM 30%, pRCA 40%, mRCA 50-60%, pPDA 50-60% (up to 70%).  Medical therapy was recommended  . LEFT BUNDLE BRANCH BLOCK 08/11/2006  . Atrial flutter ([place]) 04/12/2009  . TIA 09/02/2006    Echocardiogram 6/12: Mild to moderate MR, trace AI, trace TR, PASP 34, bubble study negative for intracardiac shunting, normal EF (greater than 55%)  . HEMORRHOIDS-INTERNAL 12/26/2009  . GERD 02/16/2007  . DIVERTICULOSIS, COLON 08/11/2006  . OSTEOARTHRITIS 08/11/2006  . OSTEOPOROSIS 08/11/2006  . Carotid stenosis     dopplers [id number]: LICA [id number]  . Hemorrhoids   . Stroke [facility]) 12-06-13    Past Surgical History  Procedure Laterality  Date  . Tonsillectomy    . Eye surgery      catarac  . Closed reduction metatarsal fracture      right 5th  . Colonoscopy w/ polypectomy  2001  . Carotid endarterectomy Left 12-05-2010  . Loop recorder implant N/A 01/03/2014    Procedure: LOOP  RECORDER IMPLANT;  Surgeon: Evans Lance, MD;  Location: [facility] CATH LAB;  Service: Cardiovascular;  Laterality: N/A;    Medications: Prior to Admission medications   Medication Sig Start Date End Date Taking? Authorizing Provider  Ascorbic Acid (VITAMIN C PO) Take 1 tablet by mouth daily.    Yes Historical Provider, MD  aspirin 81 MG tablet Take 81 mg by mouth daily.     Yes Historical Provider, MD  atorvastatin (LIPITOR) 20 MG tablet Take 1 tablet (20 mg total) by mouth daily. 12/09/14  Yes Larey Dresser, MD  calcium carbonate 200 MG capsule Take 200 mg by mouth daily.    Yes Historical Provider, MD  Cholecalciferol (VITAMIN D PO) Take 1 tablet by mouth daily.    Yes Historical Provider, MD  clopidogrel (PLAVIX) 75 MG tablet Take 75 mg by mouth daily.   Yes Historical Provider, MD  Cyanocobalamin (VITAMIN B 12 PO) Take 1 tablet by mouth daily.    Yes Historical Provider, MD  fish oil-omega-3 fatty acids 1000 MG capsule Take 1 g by mouth daily.    Yes Historical Provider, MD  furosemide (LASIX) 20 MG tablet Take 1 tablet (20 mg total) by mouth daily. 03/09/15  Yes Evans Lance, MD  gabapentin (NEURONTIN) 100 MG capsule Take 100 mg by mouth daily as needed (nerve pain).   Yes Historical Provider, MD  glucose blood test strip Use once daily 12/01/14  Yes Eulas Post, MD  metoprolol tartrate (LOPRESSOR) 25 MG tablet Take 12.5 mg by mouth 2 (two) times daily.   Yes Historical Provider, MD  Multiple Vitamins-Minerals (MULTIVITAMIN WITH MINERALS) tablet Take 1 tablet by mouth daily.   Yes Historical Provider, MD  ONE TOUCH LANCETS MISC Use daily as directed 03/25/11  Yes Eulas Post, MD  pantoprazole (PROTONIX) 20 MG tablet Take 20 mg by mouth daily as needed for heartburn or indigestion.    Yes Historical Provider, MD  VITAMIN E PO Take 1 tablet by mouth daily.    Yes Historical Provider, MD  zolpidem (AMBIEN) 10 MG tablet Take 1 tablet (10 mg total) by mouth at bedtime as needed (TRY  TO REDUCE TO HALF TABLET, IF POSSIBLE. #6 verbally called into [facility] pharmacy due to pt waiting on mailorder.). For sleep Patient taking differently: Take 10 mg by mouth at bedtime. For sleep 01/31/15  Yes Eulas Post, MD    Allergies:   Allergies  Allergen Reactions  . Nitrofurantoin     unknown  . Sulfamethoxazole-Trimethoprim     unknown  . Sulfonamide Derivatives     REACTION: rash    Social History:  Patient reports that she has never smoked. She has never used smokeless tobacco. She reports that she does not drink alcohol or use illicit drugs.  Family History: Family History  Problem Relation Age of Onset  . Heart disease Mother 51  . Kidney disease Mother   . Heart attack Mother   . Congestive Heart Failure Father 59  . Breast cancer Sister   . Colon cancer Brother   . Heart disease Brother   . Uterine cancer Sister     Physical Exam: Blood pressure  145/65, pulse 78, temperature 98.3 F (36.8 C), temperature source Oral, resp. rate 25, SpO2 99 %. General: Alert, awake, oriented x3, in no acute distress. HEENT: normocephalic, atraumatic, anicteric sclera, pink conjunctiva, pupils equal and reactive to light and accomodation, oropharynx clear Neck: supple, no masses or lymphadenopathy, no goiter, no bruits  Heart: Regular rate and rhythm, without murmurs, rubs or gallops. Lungs: Decreased breath sounds at the bases with mild faint wheezing Abdomen: Soft, nontender, nondistended, positive bowel sounds, no masses. Extremities: No clubbing, cyanosis or edema with positive pedal pulses. Neuro: Grossly intact, no focal neurological deficits, strength 5/5 upper and lower extremities bilaterally Psych: alert and oriented x 3, normal mood and affect Skin: no rashes or lesions, warm and dry   LABS on Admission:  Basic Metabolic Panel:  Recent Labs Lab 03/10/15 1237 03/15/15 1209  NA 138 137  K 4.9 4.5  CL 104 99*  CO2 28 26  GLUCOSE 156* 212*  BUN 26* 19   CREATININE 1.02 1.00  CALCIUM 9.1 9.2   Liver Function Tests:  Recent Labs Lab 03/10/15 1237  AST 26  ALT 20  ALKPHOS 37*  BILITOT 0.5  PROT 7.0  ALBUMIN 3.8   No results for input(s): LIPASE, AMYLASE in the last 168 hours. No results for input(s): AMMONIA in the last 168 hours. CBC:  Recent Labs Lab 03/09/15 1236 03/15/15 1209  WBC 9.4 6.9  NEUTROABS 5.3  --   HGB 10.8* 12.0  HCT 33.7* 35.9*  MCV 96.3 96.8  PLT 221 233   Cardiac Enzymes: No results for input(s): CKTOTAL, CKMB, CKMBINDEX, TROPONINI in the last 168 hours. BNP: Invalid input(s): POCBNP CBG: No results for input(s): GLUCAP in the last 168 hours.  Radiological Exams on Admission:  Dg Chest 2 View  03/15/2015  CLINICAL DATA:  Worsening shortness of breath for 3 months. EXAM: CHEST  2 VIEW COMPARISON:  None. FINDINGS: Hyperexpansion is consistent with emphysema. Interstitial markings are diffusely coarsened with chronic features. Ill-defined focal opacity is seen in the peripheral left upper lung. There is pleural-parenchymal scarring in the apices bilaterally. Loop recorder projects over the left heart border. Bones are diffusely demineralized. IMPRESSION: Subtle nodular density in the peripheral left lung. This has been present on previous studies dating back to 11/30/2010, compatible with scarring. Emphysema without acute cardiopulmonary process. Electronically Signed   By: Misty Stanley M.D.   On: 03/15/2015 12:38    *I have personally reviewed the images above*  EKG: Independently reviewed. Rate 70, normal sinus rhythm, no acute ST-T wave changes suggestive of ischemia   Assessment/Plan Principal Problem: Dyspnea on exertion: Differential diagnosis includes acute CHF, Acute on chronic bronchitis, with underlying emphysema seen on chest x-ray, rule out pulmonary embolism. No clear infiltrates on chest x-ray, no symptoms of URI, fevers. BNP slightly elevated. -  Admitted for observation, telemetry,  obtain serial cardiac enzymes, d-dimer. If d-dimer positive, will pursue CTA chest to rule out pulmonary embolism - 2-D echocardiogram, strict I's and O's and daily weights, Lasix 20 mg IV daily - Cardiology consult in a.m., patient wants to see Dr. Benjamine Mola - PT evaluation in a.m.    Active Problems:   Type 2 diabetes mellitus, uncontrolled (HCC) - Obtain hemoglobin A1c, place on sliding scale insulin  Dysuria, oliguria - Obtain UA and urine culture - If UA positive for UTI, will start antibiotics    Hyperlipidemia - Continue Lipitor    Essential hypertension - Continue metoprolol, Lasix    Atrial flutter (HCC) -  Currently rate controlled, on aspirin and Plavix - Not on anticoagulation    GERD -Continue PPI, changed to daily, nocturnal cough could be due to GERD   History of TIAs  - Currently no neurological deficits, continue aspirin, Plavix, Lipitor   DVT prophylaxis: Lovenox   CODE STATUS: Full CODE STATUS, confirmed with the patient   Family Communication: Admission, patients condition and plan of care including tests being ordered have been discussed with the patient and  Patient's son (on the speaker phone)  who indicates understanding and agree with the plan and Code Status  Disposition plan: Further plan will depend as patient's clinical course evolves and further radiologic and laboratory data become available. Likely home When medically ready   Time Spent on Admission: 69 minutes   Amiree No M.D. [facility] 03/15/2015, 5:59 PM Pager: [phone]  If 7PM-7AM, please contact night-coverage [email or web address] Password [email or web address]

## 2015-03-15 NOTE — Care Management Note (Signed)
Case Management Note  Patient Details  Name: RUDOLPH FINNEMORE MRN: [id number] Date of Birth: 03-Jan-1921  Subjective/Objective:                  Per H&P: Fatigue Shortness of Breath PMH: DM, HLD, HTN, CAD, LBBB, a-flutter, CVA presenting with multiple complaints. Generalized weakness, SOB, weight loss (unsure of amount over what period of time), decreased appetite, right sided back pain for several months.  Action/Plan: Cm spoke to patient at the bedside who is here with her neighbor/friend Margie at the bedside. Patient states that she lives alone and is independent. No DME in use and does not anticipate needing any upon return home. Patient denies any home needs or discharge needs at this time. PT eval pending. CM will follow for further discharge planning and home needs that may arise.   Expected Discharge Date:                  Expected Discharge Plan:  Home/Self Care  In-House Referral:     Discharge planning Services  CM Consult  Post Acute Care Choice:    Choice offered to:     DME Arranged:    DME Agency:     HH Arranged:    HH Agency:     Status of Service:  In process, will continue to follow  Medicare Important Message Given:    Date Medicare IM Given:    Medicare IM give by:    Date Additional Medicare IM Given:    Additional Medicare Important Message give by:     If discussed at [place] of Stay Meetings, dates discussed:    Additional Comments:  Guido Sander, RN 03/15/2015, 8:23 PM

## 2015-03-15 NOTE — ED Notes (Addendum)
Pt reports generalized weakness and SOB  x several months getting progressively worse pt instructed to come to ED by PCP. Pt also reports right sided back pain. Pt alert x4. NAD at this time. Pt recently started on lasix.

## 2015-03-15 NOTE — Progress Notes (Signed)
Called ED for report.  Spoke with Haley. 

## 2015-03-15 NOTE — ED Notes (Signed)
Pts neighbor went to get pt food from cafeteria.

## 2015-03-15 NOTE — ED Provider Notes (Signed)
CSN: [id number]     Arrival date & time 03/15/15  1126 History   First MD Initiated Contact with Patient 03/15/15 1409     Chief Complaint  Patient presents with  . Fatigue  . Shortness of Breath   HPI  [name] is a 82 y.o. F PMH significant for DM, HLD, HTN, CAD, LBBB, a-flutter, CVA presenting with multiple complaints.  She endorses generalized weakness, SOB, weight loss (unsure of amount over what period of time), decreased appetite, right sided back pain for several months. Her daughter-in-law, present at bedside, states she has mentioned she has foul-smelling urine, decreased urinary output over the last few weeks. She was recently started on Lasix but has not been taking it for the last few days. She denies fevers, chills, slurred speech, localized weakness, palpitations, chest pain, abdominal pain, nausea, vomiting.  Past Medical History  Diagnosis Date  . COLONIC POLYPS, ADENOMATOUS 12/26/2009  . DIABETES MELLITUS, TYPE II 08/11/2006  . HYPERLIPIDEMIA 09/09/2006  . ANEMIA, NORMOCYTIC 11/30/2009  . HYPERTENSION 09/09/2006  . CAD, NATIVE VESSEL 04/27/2008    Cardiac catheterization 6/08: EF 55%, 1+ MR, Dx 50%, LAD 50%, dLAD 40%, OM1 50-60% (up to 70%), pOM 30%, pRCA 40%, mRCA 50-60%, pPDA 50-60% (up to 70%).  Medical therapy was recommended  . LEFT BUNDLE BRANCH BLOCK 08/11/2006  . Atrial flutter ([place]) 04/12/2009  . TIA 09/02/2006    Echocardiogram 6/12: Mild to moderate MR, trace AI, trace TR, PASP 34, bubble study negative for intracardiac shunting, normal EF (greater than 55%)  . HEMORRHOIDS-INTERNAL 12/26/2009  . GERD 02/16/2007  . DIVERTICULOSIS, COLON 08/11/2006  . OSTEOARTHRITIS 08/11/2006  . OSTEOPOROSIS 08/11/2006  . Carotid stenosis     dopplers [id number]: LICA [id number]  . Hemorrhoids   . Stroke [facility]) 12-06-13   Past Surgical History  Procedure Laterality Date  . Tonsillectomy    . Eye surgery      catarac  . Closed reduction metatarsal fracture      right 5th  .  Colonoscopy w/ polypectomy  2001  . Carotid endarterectomy Left 12-05-2010  . Loop recorder implant N/A 01/03/2014    Procedure: LOOP RECORDER IMPLANT;  Surgeon: Evans Lance, MD;  Location: [facility] CATH LAB;  Service: Cardiovascular;  Laterality: N/A;   Family History  Problem Relation Age of Onset  . Heart disease Mother 4  . Kidney disease Mother   . Heart attack Mother   . Congestive Heart Failure Father 56  . Breast cancer Sister   . Colon cancer Brother   . Heart disease Brother   . Uterine cancer Sister    Social History  Substance Use Topics  . Smoking status: Never Smoker   . Smokeless tobacco: Never Used  . Alcohol Use: No   OB History    No data available     Review of Systems  Ten systems are reviewed and are negative for acute change except as noted in the HPI  Allergies  Doxycycline hyclate; Nitrofurantoin; Sulfamethoxazole-trimethoprim; and Sulfonamide derivatives  Home Medications   Prior to Admission medications   Medication Sig Start Date End Date Taking? Authorizing Provider  Ascorbic Acid (VITAMIN C PO) Take 1 tablet by mouth daily.     Historical Provider, MD  aspirin 81 MG tablet Take 81 mg by mouth daily.      Historical Provider, MD  atorvastatin (LIPITOR) 20 MG tablet Take 1 tablet (20 mg total) by mouth daily. 12/09/14   Larey Dresser, MD  calcium  carbonate 200 MG capsule Take 200 mg by mouth daily.     Historical Provider, MD  Cholecalciferol (VITAMIN D PO) Take 1 tablet by mouth daily.     Historical Provider, MD  clopidogrel (PLAVIX) 75 MG tablet Take 75 mg by mouth daily.    Historical Provider, MD  Cyanocobalamin (VITAMIN B 12 PO) Take 1 tablet by mouth daily.     Historical Provider, MD  fish oil-omega-3 fatty acids 1000 MG capsule Take 1 g by mouth daily.     Historical Provider, MD  furosemide (LASIX) 20 MG tablet Take 1 tablet (20 mg total) by mouth daily. 03/09/15   Evans Lance, MD  glucose blood test strip Use once daily 12/01/14    Eulas Post, MD  metoprolol tartrate (LOPRESSOR) 25 MG tablet Take 12.5 mg by mouth 2 (two) times daily.    Historical Provider, MD  Multiple Vitamins-Minerals (MULTIVITAMIN WITH MINERALS) tablet Take 1 tablet by mouth daily.    Historical Provider, MD  Multiple Vitamins-Minerals (OCUVITE ADULT 50+ PO) Take 1 tablet by mouth daily.     Historical Provider, MD  ONE TOUCH LANCETS MISC Use daily as directed 03/25/11   Eulas Post, MD  pantoprazole (PROTONIX) 20 MG tablet Take 20 mg by mouth daily as needed for heartburn or indigestion.     Historical Provider, MD  VITAMIN E PO Take 1 tablet by mouth daily.     Historical Provider, MD  zolpidem (AMBIEN) 10 MG tablet Take 1 tablet (10 mg total) by mouth at bedtime as needed (TRY TO REDUCE TO HALF TABLET, IF POSSIBLE. #6 verbally called into [facility] pharmacy due to pt waiting on mailorder.). For sleep 01/31/15   Eulas Post, MD   BP 146/56 mmHg  Pulse 76  Temp(Src) 98.3 F (36.8 C) (Oral)  Resp 16  SpO2 100% Physical Exam  Constitutional: She is oriented to person, place, and time. She appears well-developed and well-nourished. No distress.  HENT:  Head: Normocephalic and atraumatic.  Right Ear: External ear normal.  Left Ear: External ear normal.  Oropharynx dry  Eyes: Conjunctivae are normal. Right eye exhibits no discharge. Left eye exhibits no discharge. No scleral icterus.  Neck: Normal range of motion. No tracheal deviation present.  Cardiovascular: Normal rate, regular rhythm, normal heart sounds and intact distal pulses.  Exam reveals no gallop and no friction rub.   No murmur heard. Pulmonary/Chest: Effort normal and breath sounds normal. No respiratory distress. She has no wheezes. She has no rales. She exhibits no tenderness.  Abdominal: Soft. Bowel sounds are normal. She exhibits no distension and no mass. There is no tenderness. There is no rebound and no guarding.  Musculoskeletal: Normal range of motion. She  exhibits no edema or tenderness.  Lymphadenopathy:    She has no cervical adenopathy.  Neurological: She is alert and oriented to person, place, and time. No cranial nerve deficit. Coordination normal.  Skin: Skin is warm and dry. No rash noted. She is not diaphoretic. No erythema.  Psychiatric: She has a normal mood and affect. Her behavior is normal.  Nursing note and vitals reviewed.   ED Course  Procedures  Labs Review Labs Reviewed  BASIC METABOLIC PANEL - Abnormal; Notable for the following:    Chloride 99 (*)    Glucose, Bld 212 (*)    GFR calc non Af Amer 47 (*)    GFR calc Af Amer 55 (*)    All other components within normal limits  CBC - Abnormal; Notable for the following:    RBC 3.71 (*)    HCT 35.9 (*)    All other components within normal limits  BRAIN NATRIURETIC PEPTIDE  URINALYSIS, ROUTINE W REFLEX MICROSCOPIC (NOT AT [facility])  Randolm Idol, ED   Imaging Review Dg Chest 2 View  03/15/2015  CLINICAL DATA:  Worsening shortness of breath for 3 months. EXAM: CHEST  2 VIEW COMPARISON:  None. FINDINGS: Hyperexpansion is consistent with emphysema. Interstitial markings are diffusely coarsened with chronic features. Ill-defined focal opacity is seen in the peripheral left upper lung. There is pleural-parenchymal scarring in the apices bilaterally. Loop recorder projects over the left heart border. Bones are diffusely demineralized. IMPRESSION: Subtle nodular density in the peripheral left lung. This has been present on previous studies dating back to 11/30/2010, compatible with scarring. Emphysema without acute cardiopulmonary process. Electronically Signed   By: Misty Stanley M.D.   On: 03/15/2015 12:38   I have personally reviewed and evaluated these images and lab results as part of my medical decision-making.   EKG Interpretation None      MDM   Final diagnoses:  Weakness   Patient nontoxic appearing, vital signs stable. Patient on patient history and physical  exam, most likely etiologies include pulmonary edema vs pneumonia. Less likely etiologies include myocardial ischemia, foreign body aspiration, epiglottitis, angioedema, pneumothorax, PE, asthma, COPD, bronchospasm, pleural effusion, pneumonia, pneumonitis, malignancy.  BNP 141. BMP with hyperglycemia of 212.  Troponin, CBC, CXR without acute change. Emphysema on CXR.  I personally ambulated the patient and although her O2 sats did not decrease, she became short of breath, which she states is not baseline for her. EKG demonstrates normal sinus rhythm; septal infarct,age undetermined.  Spoke with hospitalist who agrees to admission to telemetry obs. Patient in understanding and agreement with the plan.  UA pending.   Hawthorne Lions, PA-C 03/15/15 [place], MD 03/17/15 [phone]

## 2015-03-15 NOTE — Telephone Encounter (Signed)
Patient is currently admitted. Will route to PCP as FYI.

## 2015-03-15 NOTE — Care Management Obs Status (Signed)
[place] NOTIFICATION   Patient Details  Name: [name] MRN: [id number] Date of Birth: 1921/01/26   Medicare Observation Status Notification Given:  Yes    ZENA WEERTS, RN 03/15/2015, 8:21 PM

## 2015-03-15 NOTE — Telephone Encounter (Signed)
Patient advised to go to ED. I left a message for patient to return phone call - to see what plans are in regards to going to ED. I will keep an eye on patients chart to see if checks in, and try to call patient back in a bit.

## 2015-03-16 ENCOUNTER — Observation Stay (HOSPITAL_BASED_OUTPATIENT_CLINIC_OR_DEPARTMENT_OTHER): Payer: Medicare Other

## 2015-03-16 ENCOUNTER — Ambulatory Visit: Payer: Medicare Other | Admitting: Cardiology

## 2015-03-16 ENCOUNTER — Observation Stay (HOSPITAL_COMMUNITY): Payer: Medicare Other

## 2015-03-16 DIAGNOSIS — R0600 Dyspnea, unspecified: Secondary | ICD-10-CM | POA: Diagnosis not present

## 2015-03-16 DIAGNOSIS — R0602 Shortness of breath: Secondary | ICD-10-CM

## 2015-03-16 LAB — BASIC METABOLIC PANEL
Anion gap: 9 (ref 5–15)
BUN: 19 mg/dL (ref 6–20)
CHLORIDE: 102 mmol/L (ref 101–111)
CO2: 26 mmol/L (ref 22–32)
CREATININE: 1.13 mg/dL — AB (ref 0.44–1.00)
Calcium: 8.9 mg/dL (ref 8.9–10.3)
GFR calc Af Amer: 47 mL/min — ABNORMAL LOW (ref >60)
GFR calc non Af Amer: 41 mL/min — ABNORMAL LOW (ref >60)
Glucose, Bld: 155 mg/dL — ABNORMAL HIGH (ref 65–99)
Potassium: 4.3 mmol/L (ref 3.5–5.1)
Sodium: 137 mmol/L (ref 135–145)

## 2015-03-16 LAB — PROTEIN,TOTAL AND ELECTROPHOR W/IFE
ALBUMIN ELP: 3.6 g/dL — AB (ref 3.8–4.8)
ALPHA-1-GLOBULIN: 0.4 g/dL — AB (ref 0.2–0.3)
ALPHA-2-GLOBULIN: 1.1 g/dL — AB (ref 0.5–0.9)
BETA GLOBULIN: 0.4 g/dL (ref 0.4–0.6)
Beta 2: 0.4 g/dL (ref 0.2–0.5)
Gamma Globulin: 1.4 g/dL (ref 0.8–1.7)
Total Protein, Serum Electrophoresis: 7.3 g/dL (ref 6.1–8.1)

## 2015-03-16 LAB — HEMOGLOBIN A1C
HEMOGLOBIN A1C: 7.6 % — AB (ref 4.8–5.6)
Mean Plasma Glucose: 171 mg/dL

## 2015-03-16 LAB — IMMUNOFIXATION ELECTROPHORESIS
IGA: 249 mg/dL (ref 69–380)
IGG (IMMUNOGLOBIN G), SERUM: 1420 mg/dL (ref 690–1700)
IgM, Serum: 48 mg/dL — ABNORMAL LOW (ref 52–322)

## 2015-03-16 LAB — TROPONIN I
Troponin I: <0.03 ng/mL (ref <0.031)
Troponin I: <0.03 ng/mL (ref <0.031)

## 2015-03-16 LAB — URINE CULTURE: Culture: 7000

## 2015-03-16 LAB — GLUCOSE, CAPILLARY
GLUCOSE-CAPILLARY: 190 mg/dL — AB (ref 65–99)
Glucose-Capillary: 137 mg/dL — ABNORMAL HIGH (ref 65–99)
Glucose-Capillary: 108 mg/dL — ABNORMAL HIGH (ref 65–99)
Glucose-Capillary: 168 mg/dL — ABNORMAL HIGH (ref 65–99)

## 2015-03-16 MED ORDER — CLOPIDOGREL BISULFATE 75 MG PO TABS
75.0000 mg | ORAL_TABLET | Freq: Every day | ORAL | Status: DC
  Administered 2015-03-16 – 2015-03-17 (×2): 75 mg via ORAL
  Filled 2015-03-16: qty 1

## 2015-03-16 MED ORDER — IPRATROPIUM-ALBUTEROL 0.5-2.5 (3) MG/3ML IN SOLN: 3.0000 mL | Freq: Four times a day (QID) | RESPIRATORY_TRACT | Status: DC | PRN

## 2015-03-16 MED ORDER — ASPIRIN EC 81 MG PO TBEC
81.0000 mg | DELAYED_RELEASE_TABLET | Freq: Every day | ORAL | Status: DC
  Administered 2015-03-16: 81 mg via ORAL
  Filled 2015-03-16 (×2): qty 1

## 2015-03-16 MED ORDER — ASPIRIN EC 81 MG PO TBEC
81.0000 mg | DELAYED_RELEASE_TABLET | Freq: Every day | ORAL | Status: DC
  Filled 2015-03-16: qty 1

## 2015-03-16 MED ORDER — IOHEXOL 350 MG/ML SOLN
100.0000 mL | Freq: Once | INTRAVENOUS | Status: AC | PRN
  Administered 2015-03-16: 100 mL via INTRAVENOUS

## 2015-03-16 MED ORDER — ENOXAPARIN SODIUM 30 MG/0.3ML ~~LOC~~ SOLN
30.0000 mg | SUBCUTANEOUS | Status: DC
  Administered 2015-03-17: 30 mg via SUBCUTANEOUS
  Filled 2015-03-16: qty 0.3

## 2015-03-16 NOTE — Progress Notes (Signed)
  Echocardiogram 2D Echocardiogram has been performed.  [name] 03/16/2015, 4:02 PM

## 2015-03-16 NOTE — Progress Notes (Signed)
[facility] PROGRESS NOTE  [name] [id number] DOB: 1920/06/25 DOA: 03/15/2015 PCP: Eulas Post, MD  Assessment/Plan: Principal Problem:   Shortness of breath - copd vs chf exacerbation - awaiting echocardiogram results - troponins negative x 3  Active Problems:   Type 2 diabetes mellitus, controlled ([place]) - continue SSI - will monitor cbg's    Hyperlipidemia - Stable continue statin    Essential hypertension - Pt on metoprolol and ARB - stable    Atrial flutter (HCC) - metoprolol    GERD - Pt on protonix   Code Status: full Family Communication: None at bedside Disposition Plan: pending improvement in condition   Consultants:  None  Procedures:  None  Antibiotics:  None  HPI/Subjective: Pt has no new complaints. No acute issues overnight. States she is feeling better currently  Objective: Filed Vitals:   03/16/15 0945 03/16/15 0947  BP: 132/52   Pulse: 81 82  Temp: 97.6 F (36.4 C)   Resp: 20 18    Intake/Output Summary (Last 24 hours) at 03/16/15 1725 Last data filed at 03/16/15 1027  Gross per 24 hour  Intake    123 ml  Output      0 ml  Net    123 ml   Filed Weights   03/15/15 2315  Weight: 56.201 kg (123 lb 14.4 oz)    Exam:   General:  Pt in nad, alert and awake  Cardiovascular: rrr, no mrg  Respiratory: prolonged exp phase, no wheezes  Abdomen: soft, nt, nd  Musculoskeletal: no cyanosis or clubbing   Data Reviewed: Basic Metabolic Panel:  Recent Labs Lab 03/10/15 1237 03/15/15 1209 03/15/15 2001 03/16/15 0148  NA 138 137  --  137  K 4.9 4.5  --  4.3  CL 104 99*  --  102  CO2 28 26  --  26  GLUCOSE 156* 212*  --  155*  BUN 26* 19  --  19  CREATININE 1.02 1.00 0.89 1.13*  CALCIUM 9.1 9.2  --  8.9   Liver Function Tests:  Recent Labs Lab 03/10/15 1237  AST 26  ALT 20  ALKPHOS 37*  BILITOT 0.5  PROT 7.0  ALBUMIN 3.8   No results for input(s): LIPASE, AMYLASE in the last 168  hours. No results for input(s): AMMONIA in the last 168 hours. CBC:  Recent Labs Lab 03/15/15 1209 03/15/15 2001  WBC 6.9 7.9  HGB 12.0 11.6*  HCT 35.9* 34.7*  MCV 96.8 96.1  PLT 233 221   Cardiac Enzymes:  Recent Labs Lab 03/15/15 2001 03/16/15 0148 03/16/15 0747  TROPONINI <0.03 <0.03 <0.03   BNP (last 3 results)  Recent Labs  03/15/15 1441  BNP 141.1*    ProBNP (last 3 results) No results for input(s): PROBNP in the last 8760 hours.  CBG:  Recent Labs Lab 03/15/15 1958 03/16/15 0800 03/16/15 1159 03/16/15 1719  GLUCAP 118* 137* 190* 108*    Recent Results (from the past 240 hour(s))  Urine culture     Status: None   Collection Time: 03/15/15  7:10 PM  Result Value Ref Range Status   Specimen Description URINE, CLEAN CATCH  Final   Special Requests NONE  Final   Culture 7,000 COLONIES/mL INSIGNIFICANT GROWTH  Final   Report Status 03/16/2015 FINAL  Final     Studies: Dg Chest 2 View  03/15/2015  CLINICAL DATA:  Worsening shortness of breath for 3 months. EXAM: CHEST  2 VIEW COMPARISON:  None. FINDINGS: Hyperexpansion  is consistent with emphysema. Interstitial markings are diffusely coarsened with chronic features. Ill-defined focal opacity is seen in the peripheral left upper lung. There is pleural-parenchymal scarring in the apices bilaterally. Loop recorder projects over the left heart border. Bones are diffusely demineralized. IMPRESSION: Subtle nodular density in the peripheral left lung. This has been present on previous studies dating back to 11/30/2010, compatible with scarring. Emphysema without acute cardiopulmonary process. Electronically Signed   By: Misty Stanley M.D.   On: 03/15/2015 12:38   Ct Angio Chest Pe W/cm &/or Wo Cm  03/16/2015  CLINICAL DATA:  Generalized body weakness, shortness of Breath EXAM: CT ANGIOGRAPHY CHEST WITH CONTRAST TECHNIQUE: Multidetector CT imaging of the chest was performed using the standard protocol during bolus  administration of intravenous contrast. Multiplanar CT image reconstructions and MIPs were obtained to evaluate the vascular anatomy. CONTRAST:  146mL OMNIPAQUE IOHEXOL 350 MG/ML SOLN COMPARISON:  08/29/2008 FINDINGS: Sagittal images of the spine shows osteopenia and mild degenerative changes thoracic spine. Images of the thoracic inlet are unremarkable. Central airways are patent. There is no mediastinal hematoma or adenopathy. Mild thickening of distal esophageal wall. Mild gastroesophageal reflux cannot be excluded. Tiny hiatal hernia. The visualized upper abdomen shows no adrenal gland mass. Atherosclerotic calcifications of thoracic aorta. The study is of excellent technical quality. No pulmonary embolus is noted. Sub- carinal lymph node measures 8 mm short-axis not pathologic by size criteria. No hilar adenopathy. Atherosclerotic calcifications of coronary arteries. Mild bilateral perihilar peribronchial thickening suspicious for mild bronchitic changes. There is bilateral apical pleural parenchymal scarring. Mild pleural thickening is noted bilateral upper lobe with slight progression from prior exam most likely due to scarring. There is peripheral thickening of interlobular septa bilateral upper lobes most likely due to fibrotic changes. There is no segmental consolidation. Minimal atelectasis or infiltrate noted in right middle lobe. Streaky mild interstitial prominence bilateral lower lobe probable due to mild fibrosis or atelectasis. Review of the MIP images confirms the above findings. IMPRESSION: 1. No pulmonary embolus is noted. 2. Subtle mild thickening of esophageal wall suspicious for gastroesophageal reflux. 3. Atherosclerotic calcifications of thoracic aorta and coronary arteries. 4. There is bilateral apical pleural parenchymal scarring. Some apical calcifications are noted bilateral probable due to scarring. Mild peripheral pleural thickening bilateral upper lobe probable due to scarring.  Peripheral mild interstitial prominence bilateral upper lobes probable due to scarring. Mild central bronchitic changes. 5. Minimal interstitial prominence bilateral lower lobes probable due to atelectasis or interstitial fibrosis. Small focal atelectasis or infiltrate in right middle lobe see axial image 95. 6. Osteopenia and mild degenerative changes thoracic spine. Electronically Signed   By: Lahoma Crocker M.D.   On: 03/16/2015 12:15    Scheduled Meds: . aspirin EC  81 mg Oral Daily  . atorvastatin  20 mg Oral q1800  . calcium carbonate  1 tablet Oral Q breakfast  . clopidogrel  75 mg Oral Daily  . [START ON 03/17/2015] enoxaparin (LOVENOX) injection  30 mg Subcutaneous Q24H  . furosemide  20 mg Intravenous Daily  . gabapentin  100 mg Oral BID  . insulin aspart  0-5 Units Subcutaneous QHS  . insulin aspart  0-9 Units Subcutaneous TID WC  . losartan  25 mg Oral Daily  . metoprolol tartrate  12.5 mg Oral BID  . multivitamin with minerals  1 tablet Oral Daily  . pantoprazole  20 mg Oral Daily  . sodium chloride flush  3 mL Intravenous Q12H   Continuous Infusions:   Time  spent: > 35 minutes   Velvet Bathe  [facility] Pager [phone] If 7PM-7AM, please contact night-coverage at [email or web address], password [facility] 03/16/2015, 5:25 PM

## 2015-03-16 NOTE — Evaluation (Signed)
Physical Therapy Evaluation Patient Details Name: [name] MRN: [id number] DOB: [birth year] Today's Date: 03/16/2015   History of Present Illness  Patient is a 82 year old female with diabetes, hypertension, CAD, atrial flutter, prior history of TIA presented to ED with multiple complaints, mainly shortness of breath. History was obtained from the patient. Patient reported that she had noticed shortness of breath with exertion in the last few months and worsening in the last 1 week. She denied any chest pain, fevers, chills, any productive cough. Patient had seen Dr. Lovena Le on 03/09/15 and was started on Lasix 20 mg daily however patient has not taken Lasix in the last 2-3 days stating that she felt "dry" and numbness in her fingers  Clinical Impression  Very pleasant 82 y.o. Female with the above medical history. PTA lived Independently w/o AD. Pt requiring Min G for ambulation and stair training in the hospital environment for safety due to generalized weakness. Pt feels she will continue to get stronger when she is able to get out OOB and PT agrees. Will continue to follow acutely to minimize deconditioning, before pt returns home.    Follow Up Recommendations No PT follow up    Equipment Recommendations  None recommended by PT    Recommendations for Other Services       Precautions / Restrictions Precautions Precautions: None Restrictions Weight Bearing Restrictions: No      Mobility  Bed Mobility Overal bed mobility: Independent             General bed mobility comments: rises to EOB without need for increased time or bed rails  Transfers Overall transfer level: Independent Equipment used: None             General transfer comment: sit/stand from EOB and toilet, no safety concerns, stable upon initial stand  Ambulation/Gait Ambulation/Gait assistance: Min guard Ambulation Distance (Feet): 350 Feet Assistive device: None Gait Pattern/deviations: Step-through  pattern;Drifts right/left;Decreased stride length     General Gait Details: tends to keep arms in front with minimal swing, minimal LOB when performing dual tasks which pt self-corrects, min G for safety  Stairs Stairs: Yes Stairs assistance: Supervision Stair Management: One rail Left;Alternating pattern Number of Stairs: 10 General stair comments: completed stairs without excessive fatigue  Wheelchair Mobility    Modified Rankin (Stroke Patients Only)       Balance Overall balance assessment: Independent                               Standardized Balance Assessment Standardized Balance Assessment : Dynamic Gait Index   Dynamic Gait Index Level Surface: Mild Impairment Change in Gait Speed: Normal Gait with Horizontal Head Turns: Normal Gait with Vertical Head Turns: Normal Gait and Pivot Turn: Mild Impairment Step Over Obstacle: Normal Step Around Obstacles: Mild Impairment Steps: Mild Impairment Total Score: 20       Pertinent Vitals/Pain Pain Assessment: No/denies pain    Home Living Family/patient expects to be discharged to:: Private residence Living Arrangements: Alone Available Help at Discharge: Friend(s);Available PRN/intermittently Type of Home: House Home Access: Level entry     Home Layout: Two level;Able to live on main level with bedroom/bathroom Home Equipment: Other (comment) (power chair to get into the basement)      Prior Function Level of Independence: Independent         Comments: drives, retired, spends time with neighbors     Hand Dominance   Dominant  Hand: Right    Extremity/Trunk Assessment   Upper Extremity Assessment: Overall WFL for tasks assessed           Lower Extremity Assessment: Overall WFL for tasks assessed;Generalized weakness      Cervical / Trunk Assessment: Normal  Communication   Communication: No difficulties  Cognition Arousal/Alertness: Awake/alert Behavior During Therapy: WFL  for tasks assessed/performed Overall Cognitive Status: Within Functional Limits for tasks assessed                      General Comments      Exercises        Assessment/Plan    PT Assessment Patient needs continued PT services  PT Diagnosis Generalized weakness   PT Problem List Decreased activity tolerance;Decreased mobility;Decreased balance  PT Treatment Interventions Gait training;Stair training;Therapeutic activities;Therapeutic exercise;Balance training   PT Goals (Current goals can be found in the Care Plan section) Acute Rehab PT Goals Patient Stated Goal: not to get any weaker PT Goal Formulation: With patient Time For Goal Achievement: 03/30/15 Potential to Achieve Goals: Good    Frequency Min 2X/week   Barriers to discharge        Co-evaluation               End of Session   Activity Tolerance: Patient tolerated treatment well Patient left: in chair;with call bell/phone within reach Nurse Communication: Mobility status    Functional Assessment Tool Used: clinical observation Functional Limitation: Mobility: Walking and moving around Mobility: Walking and Moving Around Current Status [id number]): At least 1 percent but less than 20 percent impaired, limited or restricted Mobility: Walking and Moving Around Goal Status [id number]): At least 1 percent but less than 20 percent impaired, limited or restricted    Time: 0902-0917 PT Time Calculation (min) (ACUTE ONLY): 15 min   Charges:   PT Evaluation $PT Eval Low Complexity: 1 Procedure     PT G Codes:   PT G-Codes **NOT FOR INPATIENT CLASS** Functional Assessment Tool Used: clinical observation Functional Limitation: Mobility: Walking and moving around Mobility: Walking and Moving Around Current Status [id number]): At least 1 percent but less than 20 percent impaired, limited or restricted Mobility: Walking and Moving Around Goal Status [phone]): At least 1 percent but less than 20 percent impaired,  limited or restricted    Ara Kussmaul 03/16/2015, 11:54 AM  Ara Kussmaul, Student Physical Therapist Acute Rehab [phone]

## 2015-03-16 NOTE — Progress Notes (Signed)
03/16/2015 10:16 AM  Patient stated she would like some of her medications adjusted to the times she takes them at home. Will inform department based pharmacy.   Patient also stated that she was going home and that she would prefer not to take her medications until she got home. informed patient that I was unaware of her discharge and that I was informed she had more test to be taken. Patient son [name] called (HPOA) stated he would prefer for her to be discharged as well but understand that patient is alert and oreinted and can make her own medical choices, so if she still chooses to leave AMA we have to grant her wishes.   Informed attending MD about conversations. Will continue to monitor and assess the patient.   [employer], RN-BC, [employer] [facility] [address] Phone [phone]

## 2015-03-17 DIAGNOSIS — R0602 Shortness of breath: Secondary | ICD-10-CM | POA: Diagnosis not present

## 2015-03-17 LAB — BASIC METABOLIC PANEL
ANION GAP: 9 (ref 5–15)
BUN: 22 mg/dL — ABNORMAL HIGH (ref 6–20)
CALCIUM: 8.7 mg/dL — AB (ref 8.9–10.3)
CO2: 26 mmol/L (ref 22–32)
CREATININE: 1.14 mg/dL — AB (ref 0.44–1.00)
Chloride: 98 mmol/L — ABNORMAL LOW (ref 101–111)
GFR, EST AFRICAN AMERICAN: 47 mL/min — AB (ref >60)
GFR, EST NON AFRICAN AMERICAN: 40 mL/min — AB (ref >60)
Glucose, Bld: 125 mg/dL — ABNORMAL HIGH (ref 65–99)
Potassium: 4.2 mmol/L (ref 3.5–5.1)
SODIUM: 133 mmol/L — AB (ref 135–145)

## 2015-03-17 LAB — CUP PACEART REMOTE DEVICE CHECK: Date Time Interrogation Session: 20161217173621

## 2015-03-17 LAB — GLUCOSE, CAPILLARY
GLUCOSE-CAPILLARY: 138 mg/dL — AB (ref 65–99)
Glucose-Capillary: 131 mg/dL — ABNORMAL HIGH (ref 65–99)

## 2015-03-17 MED ORDER — FUROSEMIDE 20 MG PO TABS: 20.0000 mg | ORAL_TABLET | ORAL | Status: DC

## 2015-03-17 MED ORDER — ZOLPIDEM TARTRATE 5 MG PO TABS
5.0000 mg | ORAL_TABLET | Freq: Every evening | ORAL | Status: DC | PRN
Start: 2015-03-17 — End: 2015-03-22

## 2015-03-17 MED ORDER — ALBUTEROL SULFATE HFA 108 (90 BASE) MCG/ACT IN AERS: 2.0000 | INHALATION_SPRAY | Freq: Four times a day (QID) | RESPIRATORY_TRACT | Status: DC | PRN

## 2015-03-17 NOTE — Care Management Obs Status (Signed)
[facility] NOTIFICATION   Patient Details  Name: [name] MRN: [id number] Date of Birth: 1920-11-23   Medicare Observation Status Notification Given:  Yes    Taryne Kiger, Rory Percy, RN 03/17/2015, 10:46 AM

## 2015-03-17 NOTE — Discharge Summary (Signed)
Physician Discharge Summary  [name] [id number] DOB: November 07, 1920 DOA: 03/15/2015  PCP: Eulas Post, MD  Admit date: 03/15/2015 Discharge date: 03/17/2015  Time spent: 35 minutes  Recommendations for Outpatient Follow-up:  1. Follow-up with serum creatinine 2. Will hold Lasix for 3 days then patient can start every other day 3. Recommended patient follow-up with primary care physician for pulmonary function testing   Discharge Diagnoses:  Principal Problem:   Shortness of breath Active Problems:   Type 2 diabetes mellitus, controlled ([place])   Hyperlipidemia   Essential hypertension   Atrial flutter (HCC)   GERD   Dyspnea   Discharge Condition: stable  Diet recommendation: Heart healthy  Filed Weights   03/15/15 2315 03/16/15 2114 03/17/15 0500  Weight: 56.201 kg (123 lb 14.4 oz) 55.384 kg (122 lb 1.6 oz) 55.384 kg (122 lb 1.6 oz)    History of present illness:  Patient is a 82 year old with emphysema, type 2 diabetes mellitus, hyperlipidemia, essential hypertension who presented to the hospital complaining of shortness of breath.  Hospital Course:  Shortness of breath - Thought to be secondary to COPD or CHF exacerbation. Improved with Lasix initially and inhaled bronchodilators. I suspect that this is most likely progression of patient's emphysema given CT angiogram of chest findings. CT angiogram reported no clots but did however report scarring - Will recommend patient follow-up with her primary care physician for pulmonary function testing - She states she does not have albuterol at home such will provide prescription for albuterol discharge  Otherwise for known comorbidities listed above we'll continue home medication regimen listed below  Procedures:  Echocardiogram: EF 55-60% with grade 1 DD  Consultations:  None  Discharge Exam: Filed Vitals:   03/17/15 0552 03/17/15 0820  BP: 116/51 137/60  Pulse: 76 77  Temp: 98.4 F (36.9 C) 97.8 F (36.6  C)  Resp: 16 16    General: Patient in no acute distress, alert and awake Cardiovascular: Regular rate and rhythm, no rubs Respiratory: No increased work of breathing, speaking in full sentences, equal chest rise, no wheezes  Discharge Instructions   Discharge Instructions    Call MD for:  difficulty breathing, headache or visual disturbances    Complete by:  As directed      Call MD for:  temperature >100.4    Complete by:  As directed      Diet - low sodium heart healthy    Complete by:  As directed      Discharge instructions    Complete by:  As directed   Recommend patient follow-up with primary care physician for pulmonary function testing. Also recommended patient follow-up with her cardiologist within the next 1-2 weeks.     Increase activity slowly    Complete by:  As directed           Current Discharge Medication List    CONTINUE these medications which have CHANGED   Details  furosemide (LASIX) 20 MG tablet Take 1 tablet (20 mg total) by mouth every other day. Qty: 90 tablet, Refills: 3    zolpidem (AMBIEN) 5 MG tablet Take 1 tablet (5 mg total) by mouth at bedtime as needed (TRY TO REDUCE TO HALF TABLET, IF POSSIBLE. #6 verbally called into [facility] pharmacy due to pt waiting on mailorder.). For sleep      CONTINUE these medications which have NOT CHANGED   Details  Ascorbic Acid (VITAMIN C PO) Take 1 tablet by mouth daily.     aspirin 81  MG tablet Take 81 mg by mouth daily.      atorvastatin (LIPITOR) 20 MG tablet Take 1 tablet (20 mg total) by mouth daily. Qty: 90 tablet, Refills: 1    calcium carbonate 200 MG capsule Take 200 mg by mouth daily.     Cholecalciferol (VITAMIN D PO) Take 1 tablet by mouth daily.     clopidogrel (PLAVIX) 75 MG tablet Take 75 mg by mouth daily.    Cyanocobalamin (VITAMIN B 12 PO) Take 1 tablet by mouth daily.     fish oil-omega-3 fatty acids 1000 MG capsule Take 1 g by mouth daily.     gabapentin (NEURONTIN) 100 MG  capsule Take 100 mg by mouth daily as needed (nerve pain).    glucose blood test strip Use once daily Qty: 100 each, Refills: 3    metoprolol tartrate (LOPRESSOR) 25 MG tablet Take 12.5 mg by mouth 2 (two) times daily.   Associated Diagnoses: Essential hypertension    Multiple Vitamins-Minerals (MULTIVITAMIN WITH MINERALS) tablet Take 1 tablet by mouth daily.    ONE TOUCH LANCETS MISC Use daily as directed Qty: 200 each, Refills: 3    pantoprazole (PROTONIX) 20 MG tablet Take 20 mg by mouth daily as needed for heartburn or indigestion.     VITAMIN E PO Take 1 tablet by mouth daily.        Allergies  Allergen Reactions  . Nitrofurantoin     unknown  . Sulfamethoxazole-Trimethoprim     unknown  . Sulfonamide Derivatives     REACTION: rash      The results of significant diagnostics from this hospitalization (including imaging, microbiology, ancillary and laboratory) are listed below for reference.    Significant Diagnostic Studies: Dg Chest 2 View  03/15/2015  CLINICAL DATA:  Worsening shortness of breath for 3 months. EXAM: CHEST  2 VIEW COMPARISON:  None. FINDINGS: Hyperexpansion is consistent with emphysema. Interstitial markings are diffusely coarsened with chronic features. Ill-defined focal opacity is seen in the peripheral left upper lung. There is pleural-parenchymal scarring in the apices bilaterally. Loop recorder projects over the left heart border. Bones are diffusely demineralized. IMPRESSION: Subtle nodular density in the peripheral left lung. This has been present on previous studies dating back to 11/30/2010, compatible with scarring. Emphysema without acute cardiopulmonary process. Electronically Signed   By: Misty Stanley M.D.   On: 03/15/2015 12:38   Ct Angio Chest Pe W/cm &/or Wo Cm  03/16/2015  CLINICAL DATA:  Generalized body weakness, shortness of Breath EXAM: CT ANGIOGRAPHY CHEST WITH CONTRAST TECHNIQUE: Multidetector CT imaging of the chest was performed  using the standard protocol during bolus administration of intravenous contrast. Multiplanar CT image reconstructions and MIPs were obtained to evaluate the vascular anatomy. CONTRAST:  140mL OMNIPAQUE IOHEXOL 350 MG/ML SOLN COMPARISON:  08/29/2008 FINDINGS: Sagittal images of the spine shows osteopenia and mild degenerative changes thoracic spine. Images of the thoracic inlet are unremarkable. Central airways are patent. There is no mediastinal hematoma or adenopathy. Mild thickening of distal esophageal wall. Mild gastroesophageal reflux cannot be excluded. Tiny hiatal hernia. The visualized upper abdomen shows no adrenal gland mass. Atherosclerotic calcifications of thoracic aorta. The study is of excellent technical quality. No pulmonary embolus is noted. Sub- carinal lymph node measures 8 mm short-axis not pathologic by size criteria. No hilar adenopathy. Atherosclerotic calcifications of coronary arteries. Mild bilateral perihilar peribronchial thickening suspicious for mild bronchitic changes. There is bilateral apical pleural parenchymal scarring. Mild pleural thickening is noted bilateral upper lobe with  slight progression from prior exam most likely due to scarring. There is peripheral thickening of interlobular septa bilateral upper lobes most likely due to fibrotic changes. There is no segmental consolidation. Minimal atelectasis or infiltrate noted in right middle lobe. Streaky mild interstitial prominence bilateral lower lobe probable due to mild fibrosis or atelectasis. Review of the MIP images confirms the above findings. IMPRESSION: 1. No pulmonary embolus is noted. 2. Subtle mild thickening of esophageal wall suspicious for gastroesophageal reflux. 3. Atherosclerotic calcifications of thoracic aorta and coronary arteries. 4. There is bilateral apical pleural parenchymal scarring. Some apical calcifications are noted bilateral probable due to scarring. Mild peripheral pleural thickening bilateral  upper lobe probable due to scarring. Peripheral mild interstitial prominence bilateral upper lobes probable due to scarring. Mild central bronchitic changes. 5. Minimal interstitial prominence bilateral lower lobes probable due to atelectasis or interstitial fibrosis. Small focal atelectasis or infiltrate in right middle lobe see axial image 95. 6. Osteopenia and mild degenerative changes thoracic spine. Electronically Signed   By: Lahoma Crocker M.D.   On: 03/16/2015 12:15    Microbiology: Recent Results (from the past 240 hour(s))  Urine culture     Status: None   Collection Time: 03/15/15  7:10 PM  Result Value Ref Range Status   Specimen Description URINE, CLEAN CATCH  Final   Special Requests NONE  Final   Culture 7,000 COLONIES/mL INSIGNIFICANT GROWTH  Final   Report Status 03/16/2015 FINAL  Final     Labs: Basic Metabolic Panel:  Recent Labs Lab 03/15/15 1209 03/15/15 2001 03/16/15 0148 03/17/15 0530  NA 137  --  137 133*  K 4.5  --  4.3 4.2  CL 99*  --  102 98*  CO2 26  --  26 26  GLUCOSE 212*  --  155* 125*  BUN 19  --  19 22*  CREATININE 1.00 0.89 1.13* 1.14*  CALCIUM 9.2  --  8.9 8.7*   Liver Function Tests: No results for input(s): AST, ALT, ALKPHOS, BILITOT, PROT, ALBUMIN in the last 168 hours. No results for input(s): LIPASE, AMYLASE in the last 168 hours. No results for input(s): AMMONIA in the last 168 hours. CBC:  Recent Labs Lab 03/15/15 1209 03/15/15 2001  WBC 6.9 7.9  HGB 12.0 11.6*  HCT 35.9* 34.7*  MCV 96.8 96.1  PLT 233 221   Cardiac Enzymes:  Recent Labs Lab 03/15/15 2001 03/16/15 0148 03/16/15 0747  TROPONINI <0.03 <0.03 <0.03   BNP: BNP (last 3 results)  Recent Labs  03/15/15 1441  BNP 141.1*    ProBNP (last 3 results) No results for input(s): PROBNP in the last 8760 hours.  CBG:  Recent Labs Lab 03/16/15 1159 03/16/15 1719 03/16/15 2110 03/17/15 0809 03/17/15 1145  GLUCAP 190* 108* 168* 131* 138*     Signed:  Velvet Bathe MD.  [facility] 03/17/2015, 1:11 PM

## 2015-03-22 ENCOUNTER — Encounter: Payer: Self-pay | Admitting: Family Medicine

## 2015-03-22 ENCOUNTER — Ambulatory Visit (INDEPENDENT_AMBULATORY_CARE_PROVIDER_SITE_OTHER): Payer: Medicare Other | Admitting: Family Medicine

## 2015-03-22 VITALS — BP 138/70 | HR 57 | Temp 97.3°F | Wt 124.8 lb

## 2015-03-22 DIAGNOSIS — R0600 Dyspnea, unspecified: Secondary | ICD-10-CM

## 2015-03-22 DIAGNOSIS — I10 Essential (primary) hypertension: Secondary | ICD-10-CM

## 2015-03-22 DIAGNOSIS — R5382 Chronic fatigue, unspecified: Secondary | ICD-10-CM

## 2015-03-22 DIAGNOSIS — R634 Abnormal weight loss: Secondary | ICD-10-CM | POA: Diagnosis not present

## 2015-03-22 LAB — BASIC METABOLIC PANEL
BUN: 30 mg/dL — ABNORMAL HIGH (ref 6–23)
CHLORIDE: 98 meq/L (ref 96–112)
CO2: 28 meq/L (ref 19–32)
CREATININE: 1.14 mg/dL (ref 0.40–1.20)
Calcium: 9.4 mg/dL (ref 8.4–10.5)
GFR: 47.3 mL/min — ABNORMAL LOW (ref >60.00)
GLUCOSE: 122 mg/dL — AB (ref 70–99)
Potassium: 4.1 mEq/L (ref 3.5–5.1)
Sodium: 135 mEq/L (ref 135–145)

## 2015-03-22 NOTE — Patient Instructions (Signed)
We will call you with the pulmonary referral Continue with Lasix one every other day.

## 2015-03-22 NOTE — Progress Notes (Signed)
Subjective:    Patient ID: [name], female    DOB: 09-29-1920, 82 y.o.   MRN: [id number]  HPI Patient seen for hospital follow-up. 82 year old female with history of chronic normocytic anemia, past history of atrial flutter, CAD, peripheral vascular disease, hypertension, hyperlipidemia, osteoporosis, type 2 diabetes. She has had some progressive recent appetite changes, modest weight loss, and dyspnea even at rest. She also had complained of some recent low back pain and we obtained further labs significant for sedimentation rate of 71. Basic chemistries were normal. We ordered SPEP and UPEP which did not show any abnormal protein spike.  In addition to the mild recent weight loss she is complaining mostly of some recent dyspnea. During recent hospitalization, she had troponins which were negative. D-dimer minimally elevated 0.63. CT angiogram negative for pulmonary embolus. BNP level 141. Echocardiogram revealed ejection fraction 55-60%. Grade 1 diastolic dysfunction. Patient was started on furosemide 20 mg every other day.  She was given albuterol inhaler but she states she does not know "how to use this "    Past Medical History  Diagnosis Date  . COLONIC POLYPS, ADENOMATOUS 12/26/2009  . DIABETES MELLITUS, TYPE II 08/11/2006  . HYPERLIPIDEMIA 09/09/2006  . ANEMIA, NORMOCYTIC 11/30/2009  . HYPERTENSION 09/09/2006  . CAD, NATIVE VESSEL 04/27/2008    Cardiac catheterization 6/08: EF 55%, 1+ MR, Dx 50%, LAD 50%, dLAD 40%, OM1 50-60% (up to 70%), pOM 30%, pRCA 40%, mRCA 50-60%, pPDA 50-60% (up to 70%).  Medical therapy was recommended  . LEFT BUNDLE BRANCH BLOCK 08/11/2006  . Atrial flutter ([place]) 04/12/2009  . TIA 09/02/2006    Echocardiogram 6/12: Mild to moderate MR, trace AI, trace TR, PASP 34, bubble study negative for intracardiac shunting, normal EF (greater than 55%)  . HEMORRHOIDS-INTERNAL 12/26/2009  . GERD 02/16/2007  . DIVERTICULOSIS, COLON 08/11/2006  . OSTEOARTHRITIS 08/11/2006    . OSTEOPOROSIS 08/11/2006  . Carotid stenosis     dopplers [id number]: LICA [id number]  . Hemorrhoids   . Stroke [facility]) 12-06-13   Past Surgical History  Procedure Laterality Date  . Tonsillectomy    . Eye surgery      catarac  . Closed reduction metatarsal fracture      right 5th  . Colonoscopy w/ polypectomy  2001  . Carotid endarterectomy Left 12-05-2010  . Loop recorder implant N/A 01/03/2014    Procedure: LOOP RECORDER IMPLANT;  Surgeon: Evans Lance, MD;  Location: [facility] CATH LAB;  Service: Cardiovascular;  Laterality: N/A;    reports that she has never smoked. She has never used smokeless tobacco. She reports that she does not drink alcohol or use illicit drugs. family history includes Breast cancer in her sister; Colon cancer in her brother; Congestive Heart Failure (age of onset: 62) in her father; Heart attack in her mother; Heart disease in her brother; Heart disease (age of onset: 51) in her mother; Kidney disease in her mother; Uterine cancer in her sister. Allergies  Allergen Reactions  . Nitrofurantoin     unknown  . Sulfamethoxazole-Trimethoprim     unknown  . Sulfonamide Derivatives     REACTION: rash      Review of Systems  Constitutional: Positive for appetite change, fatigue and unexpected weight change. Negative for fever and chills.  HENT: Negative for trouble swallowing.   Respiratory: Positive for shortness of breath. Negative for cough and wheezing.   Cardiovascular: Negative for chest pain, palpitations and leg swelling.  Gastrointestinal: Negative for nausea, vomiting and abdominal pain.  Genitourinary: Negative for dysuria.  Neurological: Negative for dizziness.       Objective:   Physical Exam  Constitutional: She appears well-developed and well-nourished.  HENT:  Mouth/Throat: Oropharynx is clear and moist.  Neck: Neck supple. No JVD present.  Cardiovascular: Normal rate and regular rhythm.   Pulmonary/Chest: Effort normal and breath sounds normal.  No respiratory distress. She has no wheezes. She has no rales.  Musculoskeletal: She exhibits no edema.  Neurological: She is alert.          Assessment & Plan:  #1 dyspnea. Pulse oximetry is 94% room air today and this did drop to 90% with ambulation. Chest x-ray shows question of chronic lung disease with questionable interstitial changes. CT angiogram negative for pulmonary embolus. No evidence for systolic heart failure. We attempted spirometry today but probably invalid result. This suggested "mild restriction ". She denies any chronic cough. She was instructed in proper use of albuterol which was given during hospitalization but not clear this will benefit her. Would like to get pulmonary referral regarding further assessment of her dyspnea and guidance regarding whether any medications may benefit this.  #2 weight loss. Recent lab work unremarkable. Etiology unclear.  She does not seem depressed.  Consider CT abdomen pelvis for further evaluation  #3 type 2 diabetes. This has generally been well controlled. Recent A1c slightly elevated 7.6%. She has follow-up in March and consider repeat at that point

## 2015-03-22 NOTE — Progress Notes (Signed)
Pre visit review using our clinic review tool, if applicable. No additional management support is needed unless otherwise documented below in the visit note. 

## 2015-03-26 NOTE — Progress Notes (Signed)
Cardiology Office Note:    Date:  03/27/2015   ID:  [name], DOB [birth year], MRN [id number]  PCP:  Eulas Post, MD  Cardiologist:  Dr.  Bing Quarry >> Dr. Ena Dawley >> Dr. Loralie Champagne   Electrophysiologist:  Dr. Cristopher Peru   Chief Complaint  Patient presents with  . Shortness of Breath    Follow up; s/p Admit to Hospital since last OV     History of Present Illness:     [name] is a 82 y.o. female with a hx of non-obstructive CAD, prior TIA, carotid stenosis, s/p left CEA 11/2010, syncope, paroxysmal AFlutter, HTN, DM2, HL, LBBB. She has been felt to be a poor candidate for Coumadin. She had atrial fibrillation documented years ago but has not had a documented recurrence. She is not on anticoagulation due to concern for profuse hemorrhoidal bleeding. Last Cardiolite in 11/14 showed no ischemia or infarction. In 8/15, she had a syncopal event while standing in the kitchen. She was not unconscious for long. She then had 2 episodes concerning for TIAs. Both times, she lost a portion of her left visual field transiently. MRI after the 2nd episode showed no evidence for CVA. She was thought to have a TIA. She had an ophthalmology appointment and was told that there appears to be nothing wrong with her eyes themselves. Linq monitor was placed on 11/23 to look for atrial fibrillation.  Last seen by Dr. Loralie Champagne 5/16.    Last seen in this office by Dr. Cristopher Peru in 1/17.  She complained of weakness and dyspnea.   ILR interrogation remained unrevealing.  She was placed on Lasix with plans to follow up in 3-4 weeks.  She was then admitted 2/1-2/3 with worsening dyspnea.  BNP was mildly elevated. D-dimer was also elevated. Chest CTA demonstrated no pulmonary embolism. There was extensive findings consistent with scarring. Discharge notes indicate that the patient's symptoms improved on diuretic therapy as well as well as bronchodilator therapy.  Echocardiogram demonstrated normal LV function with mild diastolic dysfunction. Since discharge, she has followed up with PCP. She had O2 sats drop to 90% with ambulation in the office. PFTs demonstrated suggestion of mild restriction. She has been referred to pulmonology.  Of note, she had a ESR of 71 in 1/17. SPEP/UPEP was non-specific.   Returns for follow-up. She remains fatigued and short of breath with activity.  She notes interscapular back pain with activities like vacuuming. She denies syncope, orthopnea, PND, edema.  She denies wheezing. Denies bleeding issues.  She sees Pulmonology next week.    Past Medical History  Diagnosis Date  . COLONIC POLYPS, ADENOMATOUS 12/26/2009  . DIABETES MELLITUS, TYPE II 08/11/2006  . HYPERLIPIDEMIA 09/09/2006  . ANEMIA, NORMOCYTIC 11/30/2009  . HYPERTENSION 09/09/2006  . CAD, NATIVE VESSEL 04/27/2008    Cardiac catheterization 6/08: EF 55%, 1+ MR, Dx 50%, LAD 50%, dLAD 40%, OM1 50-60% (up to 70%), pOM 30%, pRCA 40%, mRCA 50-60%, pPDA 50-60% (up to 70%).  Medical therapy was recommended  . LEFT BUNDLE BRANCH BLOCK 08/11/2006  . Atrial flutter ([place]) 04/12/2009  . TIA 09/02/2006    Echocardiogram 6/12: Mild to moderate MR, trace AI, trace TR, PASP 34, bubble study negative for intracardiac shunting, normal EF (greater than 55%)  . HEMORRHOIDS-INTERNAL 12/26/2009  . GERD 02/16/2007  . DIVERTICULOSIS, COLON 08/11/2006  . OSTEOARTHRITIS 08/11/2006  . OSTEOPOROSIS 08/11/2006  . Carotid stenosis     dopplers 4/76: LICA 54-65%  . Hemorrhoids   .  Stroke ([place]) 12-06-13  1. H/o TIA: Possible TIA in 10/15 with visual field cut. MRI (10/15) with no CVA. Linq monitor placed.  2. Carotid stenosis: Left CEA in 10/12. Carotid dopplers (10/15) with 1-39% bilateral ICA stenosis.  3. Atrial fibrillation: Paroxysmal, only prior documented episode was years ago. She was not started on anticoagulation back then due to hemorrhoidal bleeding, apparently profuse. Event monitor  (3/14) showed only NSR.  4. Hemorrhoids s/p banding.  5. HTN 6. Type II diabetes 7. Hyperlipidemia 8. LBBB 9. CAD: Nonobstructive. LHC (6/08) with EF 55%, 1+ MR, Dx 50%, LAD 50%, dLAD 40%, OM1 50-60% (up to 70%), pOM 30%, pRCA 40%, mRCA 50-60%, pPDA 50-60% (up to 70%). Adenosine Cardiolite (11/14) with EF 67%, no ischemia or infarction.  10. Syncope: 8/15. EEG (5/15) was unremarkable. 11. Echo (4/14) with EF 60-65%, mild MR, aortic sclerosis without stenosis.   Past Surgical History  Procedure Laterality Date  . Tonsillectomy    . Eye surgery      catarac  . Closed reduction metatarsal fracture      right 5th  . Colonoscopy w/ polypectomy  2001  . Carotid endarterectomy Left 12-05-2010  . Loop recorder implant N/A 01/03/2014    Procedure: LOOP RECORDER IMPLANT;  Surgeon: Evans Lance, MD;  Location: [facility] CATH LAB;  Service: Cardiovascular;  Laterality: N/A;    Current Medications: Outpatient Prescriptions Prior to Visit  Medication Sig Dispense Refill  . albuterol (PROVENTIL HFA;VENTOLIN HFA) 108 (90 Base) MCG/ACT inhaler Inhale 2 puffs into the lungs every 6 (six) hours as needed for wheezing or shortness of breath. 1 Inhaler 0  . Ascorbic Acid (VITAMIN C PO) Take 1 tablet by mouth daily.     Marland Kitchen aspirin 81 MG tablet Take 81 mg by mouth daily.      Marland Kitchen atorvastatin (LIPITOR) 20 MG tablet Take 1 tablet (20 mg total) by mouth daily. 90 tablet 1  . calcium carbonate 200 MG capsule Take 200 mg by mouth daily.     . Cholecalciferol (VITAMIN D PO) Take 1 tablet by mouth daily.     . clopidogrel (PLAVIX) 75 MG tablet Take 75 mg by mouth daily.    . Cyanocobalamin (VITAMIN B 12 PO) Take 1 tablet by mouth daily.     . fish oil-omega-3 fatty acids 1000 MG capsule Take 1 capsule by mouth daily.     . furosemide (LASIX) 20 MG tablet Take 1 tablet (20 mg total) by mouth every other day. 90 tablet 3  . gabapentin (NEURONTIN) 100 MG capsule Take 100 mg by mouth daily as needed (nerve pain).      Marland Kitchen glucose blood test strip Use once daily 100 each 3  . metoprolol tartrate (LOPRESSOR) 25 MG tablet Take 12.5 mg by mouth 2 (two) times daily.    . Multiple Vitamins-Minerals (MULTIVITAMIN WITH MINERALS) tablet Take 1 tablet by mouth daily.    . ONE TOUCH LANCETS MISC Use daily as directed 200 each 3  . pantoprazole (PROTONIX) 20 MG tablet Take 20 mg by mouth daily as needed for heartburn or indigestion.     Marland Kitchen VITAMIN E PO Take 1 tablet by mouth daily.     Marland Kitchen zolpidem (AMBIEN) 10 MG tablet Take 10 mg by mouth daily.      No facility-administered medications prior to visit.     Allergies:   Nitrofurantoin; Sulfamethoxazole-trimethoprim; and Sulfonamide derivatives   Social History   Social History  . Marital Status: Widowed    Spouse  Name: N/A  . Number of Children: 3  . Years of Education: 12   Occupational History  .     Social History Main Topics  . Smoking status: Never Smoker   . Smokeless tobacco: Never Used  . Alcohol Use: No  . Drug Use: No  . Sexual Activity: Not Asked   Other Topics Concern  . None   Social History Narrative   Patient is a widow and lives alone.   Patient has three adult children.   Patient is retired.   Patient has a high school.   Patient is right-handed.   Patient does not drink any caffeine.     Family History:  The patient's family history includes Breast cancer in her sister; Colon cancer in her brother; Congestive Heart Failure (age of onset: 29) in her father; Heart attack in her mother; Heart disease in her brother; Heart disease (age of onset: 36) in her mother; Kidney disease in her mother; Uterine cancer in her sister.   ROS:   Please see the history of present illness.    Review of Systems  Constitution: Positive for decreased appetite and weight loss.  Cardiovascular: Positive for dyspnea on exertion.  Respiratory: Positive for shortness of breath.   All other systems reviewed and are negative.   Physical Exam:    VS:   BP 158/60 mmHg  Pulse 78  Ht 5' 6"  (1.676 m)  Wt 123 lb 1.9 oz (55.847 kg)  BMI 19.88 kg/m2   GEN: Well nourished, well developed, in no acute distress HEENT: normal Neck: no JVD, no masses Cardiac: Normal U1/L2, RRR; 2/6 systolic murmur RUSB, rubs, or gallops, no edema  Respiratory:  Decreased breath sounds, faint exp wheezes throughout. GI: soft, nontender  MS: no deformity or atrophy Skin: warm and dry, no rash Neuro: no focal deficits  Psych: Alert and oriented x 3, normal affect  Wt Readings from Last 3 Encounters:  03/27/15 123 lb 1.9 oz (55.847 kg)  03/22/15 124 lb 12.8 oz (56.609 kg)  03/17/15 122 lb 1.6 oz (55.384 kg)      Studies/Labs Reviewed:     EKG:  EKG is  ordered today.  The ekg ordered today demonstrates NSR, HR 77, LBBB  Recent Labs: 03/10/2015: ALT 20 03/15/2015: B Natriuretic Peptide 141.1*; Hemoglobin 11.6*; Platelets 221; TSH 5.736* 03/22/2015: BUN 30*; Creatinine, Ser 1.14; Potassium 4.1; Sodium 135   Recent Lipid Panel    Component Value Date/Time   CHOL 116 04/21/2014 0857   TRIG 111.0 04/21/2014 0857   TRIG 80 01/13/2006 1010   HDL 47.10 04/21/2014 0857   CHOLHDL 2 04/21/2014 0857   CHOLHDL 2.8 CALC 01/13/2006 1010   VLDL 22.2 04/21/2014 0857   LDLCALC 47 04/21/2014 0857    Additional studies/ records that were reviewed today include:   Echo 03/16/15 Mild LVH, mild focal basal septal hypertrophy, EF 55-60%, no RWMA, Gr 1 DD, MAC, mild MR, mild LAE  Carotid US 10/16 RICA < 40% L CEA patent  Adenosine Myoview (12/23/12): Impression  Exercise Capacity: Adenosine study with no exercise.  BP Response: Normal blood pressure response.  Clinical Symptoms: Atypical chest pain.  ECG Impression: No significant ST segment change suggestive of ischemia.  Comparison with Prior Nuclear Study: No images to compare  Overall Impression: Normal stress nuclear study.  LV Ejection Fraction: 67%. LV Wall Motion: NL LV Function; NL Wall Motion  Echo  05/2012:  EF 60-65%, normal wall motion, Gr 1 DD, MAC, mild MR, PASP  32, reduced excursion of AV noncoronary cusp.   Carotid US 5/14: patent L CEA, RICA < 40%. Event Monitor 3/14: NSR.  LHC 6/08:  EF 55%, 1+ MR,  Dx 50%, LAD 50%, dLAD 40% OM1 50-60% (up to 70%), pOM 30% pRCA 40%, mRCA 50-60%, pPDA 50-60% (up to 70%).  Medical Rx was recommended.    ASSESSMENT:     1. Shortness of breath   2. Transient cerebral ischemia, unspecified transient cerebral ischemia type   3. Essential hypertension   4. Syncope, unspecified syncope type   5. Carotid stenosis, bilateral   6. Coronary artery disease involving native coronary artery of native heart without angina pectoris      PLAN:     In order of problems listed above:  1. Dyspnea - CT findings suggest possible interstitial fibrosis or emphysema.  PFTs with suggestion of restriction. She has a hx of mod non-obs CAD and has not had an assessment for ischemia since 2014.  She does have a hx of interscapular back pain.  She does not appear to be volume overloaded.  She has not had significant findings to suggest CHF.  ECG is unchanged with LBBB.    -  FU with Pulmonology as planned.  -  Arrange Lexiscan Myoview to rule out ischemia.  2. Possible TIA - Recurrent AFib to date has not been identified on ILR. She would likely need anticoagulation resumed if ILR documents AFib.   3. HTN - BP elevated today.  It has been well controlled.  Continue to monitor for now.   4. Syncope - s/p ILR.  FU with Dr. Cristopher Peru as planned.   5. Carotid stenosis - Followed by [facility].  6. CAD - As noted, will proceed with a Myoview as noted.  Continue ASA, Plavix, statin, beta-blocker.  She tells me she would be hesitant to proceed with a cath if her nuclear study is abnormal.  There may be room for medical therapy if needed.  Will see what her nuclear study shows.      Medication Adjustments/Labs and Tests Ordered: Current medicines are reviewed at  length with the patient today.  Concerns regarding medicines are outlined above.  Medication changes, Labs and Tests ordered today are outlined in the Patient Instructions noted below. Patient Instructions  Medication Instructions:  Your physician recommends that you continue on your current medications as directed. Please refer to the Current Medication list given to you today.  Labwork: NONE  Testing/Procedures: Your physician has requested that you have a lexiscan myoview. For further information please visit [email or web address]. Please follow instruction sheet, as given.  Follow-Up: 06/26/15 @ 2 PM WITH Marrion Finan, PAC SAME DAY DR. Aundra Dubin IS IN THE OFFICE  Any Other Special Instructions Will Be Listed Below (If Applicable).  If you need a refill on your cardiac medications before your next appointment, please call your pharmacy.    Signed, [name], PA-C  03/27/2015 5:00 PM    [address], [place], [place]  [address] Phone: [phone]; Fax: [phone]

## 2015-03-27 ENCOUNTER — Ambulatory Visit (INDEPENDENT_AMBULATORY_CARE_PROVIDER_SITE_OTHER): Payer: Medicare Other | Admitting: Physician Assistant

## 2015-03-27 ENCOUNTER — Encounter: Payer: Self-pay | Admitting: Physician Assistant

## 2015-03-27 VITALS — BP 158/60 | HR 78 | Ht 66.0 in | Wt 123.1 lb

## 2015-03-27 DIAGNOSIS — R0602 Shortness of breath: Secondary | ICD-10-CM

## 2015-03-27 DIAGNOSIS — I6523 Occlusion and stenosis of bilateral carotid arteries: Secondary | ICD-10-CM

## 2015-03-27 DIAGNOSIS — G459 Transient cerebral ischemic attack, unspecified: Secondary | ICD-10-CM | POA: Diagnosis not present

## 2015-03-27 DIAGNOSIS — I10 Essential (primary) hypertension: Secondary | ICD-10-CM

## 2015-03-27 DIAGNOSIS — I2510 Atherosclerotic heart disease of native coronary artery without angina pectoris: Secondary | ICD-10-CM | POA: Diagnosis not present

## 2015-03-27 DIAGNOSIS — R079 Chest pain, unspecified: Secondary | ICD-10-CM | POA: Diagnosis not present

## 2015-03-27 DIAGNOSIS — R55 Syncope and collapse: Secondary | ICD-10-CM

## 2015-03-27 NOTE — Patient Instructions (Addendum)
Medication Instructions:  Your physician recommends that you continue on your current medications as directed. Please refer to the Current Medication list given to you today.  Labwork: NONE  Testing/Procedures: Your physician has requested that you have a lexiscan myoview. For further information please visit [email or web address]. Please follow instruction sheet, as given.  Follow-Up: 06/26/15 @ 2 PM WITH SCOTT WEAVER, PAC SAME DAY DR. Aundra Dubin IS IN THE OFFICE  Any Other Special Instructions Will Be Listed Below (If Applicable).  If you need a refill on your cardiac medications before your next appointment, please call your pharmacy.

## 2015-03-28 ENCOUNTER — Telehealth: Payer: Self-pay | Admitting: Family Medicine

## 2015-03-28 ENCOUNTER — Inpatient Hospital Stay: Admission: RE | Admit: 2015-03-28 | Payer: Medicare Other | Source: Ambulatory Visit

## 2015-03-28 NOTE — Telephone Encounter (Signed)
Spoke with Erline Levine and she will follow up with pt in 1 week.

## 2015-03-28 NOTE — Telephone Encounter (Signed)
FYI: We changed this to with contrast to get a better picture. Okay to see if pt would go back to do without contrast when her runny nose gets better?

## 2015-03-28 NOTE — Telephone Encounter (Signed)
Patient called and said she had a runny nose and don't want to proceed with CT.  Pt seems worried about CT and Marzetta Board thinks she is worried about drinking contrast and won't reschedule the procedure with Stacy.  Pt said there was no way she could drink all the contrast.  Also she said she is by herself, and just seemed overwhelmed.

## 2015-03-28 NOTE — Telephone Encounter (Signed)
i suggest we put this on hold for a week until her nasal congestion improves.

## 2015-03-29 ENCOUNTER — Telehealth: Payer: Self-pay | Admitting: Family Medicine

## 2015-03-29 ENCOUNTER — Ambulatory Visit (INDEPENDENT_AMBULATORY_CARE_PROVIDER_SITE_OTHER): Payer: Medicare Other | Admitting: *Deleted

## 2015-03-29 ENCOUNTER — Inpatient Hospital Stay: Admission: RE | Admit: 2015-03-29 | Payer: Medicare Other | Source: Ambulatory Visit

## 2015-03-29 DIAGNOSIS — I6389 Other cerebral infarction: Secondary | ICD-10-CM

## 2015-03-29 DIAGNOSIS — I638 Other cerebral infarction: Secondary | ICD-10-CM

## 2015-03-29 NOTE — Telephone Encounter (Signed)
Her blood sugar has been climbing recently with A1C 7.6%.  I am somewhat reluctant to start Metformin at her age.  All oral medications will carry some risk.  Start low dose Amaryl 1 mg po qd and office follow up in 2 weeks and bring in home readings then to review.

## 2015-03-29 NOTE — Telephone Encounter (Signed)
PLEASE NOTE: All timestamps contained within this report are represented as [place] Standard Time. CONFIDENTIALTY NOTICE: This fax transmission is intended only for the addressee. It contains information that is legally privileged, confidential or otherwise protected from use or disclosure. If you are not the intended recipient, you are strictly prohibited from reviewing, disclosing, copying using or disseminating any of this information or taking any action in reliance on or regarding this information. If you have received this fax in error, please notify us immediately by telephone so that we can arrange for its return to [place]. Phone: [phone], Toll-Free: [phone], Fax: [phone] Page: 1 of 1 Call Id: [id number] [facility] - Client [place] Patient Name: [name] DOB: 1920/11/26 Initial Comment Caller states her BS- 181 and feels weak Nurse Assessment Nurse: Marcelline Deist, RN, Lynda Date/Time (Eastern Time): 03/29/2015 12:14:49 PM Confirm and document reason for call. If symptomatic, describe symptoms. You must click the next button to save text entered. ---Caller states her BS was 181 this afternoon and feels weak. Would like to be on a pill for her sugar. Has the patient traveled out of the country within the last 30 days? ---Not Applicable Does the patient have any new or worsening symptoms? ---Yes Will a triage be completed? ---Yes Related visit to physician within the last 2 weeks? ---No Does the PT have any chronic conditions? (i.e. diabetes, asthma, etc.) ---Yes List chronic conditions. ---emphysema, borderline diabetic, TIA's Is this a behavioral health or substance abuse call? ---No Guidelines Guideline Title Affirmed Question Affirmed Notes Diabetes - High Blood Sugar Blood glucose 60-240 mg/dl (3.5 -13 mmol/ l) (all triage questions negative) Final Disposition User See Physician within [place], RN, Lynda Comments Caller triaged for elevated blood sugar level. She would like her Dr. to look more closely into this, wondering if she needs to be put on a rx for her blood sugars. Uses [facility], allergic to Sulfa drugs. Nurse upgraded outcome since caller states she feels weak, not feeling well today, but not specific on symptoms. Please contact caller at this # with further advice. Nurse offered to schedule caller, but she has a conflict with being seen today. Referrals REFERRED TO PCP OFFICE Disagree/Comply: Comply

## 2015-03-29 NOTE — Progress Notes (Signed)
Carelink Summary Report / Loop Recorder 

## 2015-03-29 NOTE — Telephone Encounter (Signed)
Please advise 

## 2015-03-30 MED ORDER — GLIMEPIRIDE 1 MG PO TABS: 1.0000 mg | ORAL_TABLET | Freq: Every day | ORAL | Status: DC

## 2015-03-30 NOTE — Telephone Encounter (Signed)
Can you schedule this pt ASAP at her convience to "follow up to discuss fluid pill"

## 2015-03-30 NOTE — Telephone Encounter (Signed)
i think we should probably just try to bring her back sooner- maybe by early next week. "Dry feet"  Does not sound like urgent issue.

## 2015-03-30 NOTE — Telephone Encounter (Signed)
Pt new Blood Sugar medication has been sent in and f/u scheduled 3/21. Pt was started on Lasix 20mg  1 qd by Cardiology. Since starting medication pt has been experiencing stiffiness and dryness in both feet. Please advise on this or should she contact cardiology.

## 2015-03-31 NOTE — Telephone Encounter (Signed)
Pt has been scheduled for Tuesday next week.

## 2015-04-04 ENCOUNTER — Ambulatory Visit (INDEPENDENT_AMBULATORY_CARE_PROVIDER_SITE_OTHER): Payer: Medicare Other | Admitting: Family Medicine

## 2015-04-04 VITALS — BP 120/60 | HR 80 | Temp 97.6°F | Ht 66.0 in | Wt 122.8 lb

## 2015-04-04 DIAGNOSIS — R0600 Dyspnea, unspecified: Secondary | ICD-10-CM

## 2015-04-04 DIAGNOSIS — E118 Type 2 diabetes mellitus with unspecified complications: Secondary | ICD-10-CM | POA: Diagnosis not present

## 2015-04-04 MED ORDER — PROAIR RESPICLICK 108 (90 BASE) MCG/ACT IN AEPB: 2.0000 | INHALATION_SPRAY | RESPIRATORY_TRACT | Status: DC | PRN

## 2015-04-04 NOTE — Patient Instructions (Signed)
Monitor blood sugars and if fasting sugars consistently over 160 go ahead and start the Glimepiride.

## 2015-04-04 NOTE — Progress Notes (Signed)
Pre visit review using our clinic review tool, if applicable. No additional management support is needed unless otherwise documented below in the visit note. 

## 2015-04-04 NOTE — Progress Notes (Signed)
Subjective:    Patient ID: [name], female    DOB: Mar 20, 1920, 82 y.o.   MRN: [id number]  HPI Patient seen for the following issues  Dyspnea. Patient's had some progressive subjective complaints of dyspnea for several months. Had recent admission earlier this month.  Reportedly improved initially with Lasix and inhale bronchodilators. CT angiogram revealed no pulmonary embolus. Echocardiogram ejection fraction 55-60%. BNP level 141. Question of emphysema her chest x-ray and CT angiogram. Patient has never smoked but her husband had smoked about 25 years We attempted recent spirometry in office but probably invalid result. Patient is using pro-air inhaler and we observed her technique today which was very poor She denies any hemoptysis, fever, or chills. Rare cough. Not complaining of any chest pain. She has pending follow-up with pulmonary.  Type 2 diabetes. Currently not on medication. Recent A1c 7.6%. She is not checking many fasting sugars. She had 2 hour postprandial blood sugar yesterday 180. No polyuria or polydipsia. We recently called in low-dose glimepiride 1 mg daily but she never started this.  Multiple other chronic problems including history of peripheral vascular disease, past history of atrial flutter, chronic insomnia, hypertension, GERD, hyperlipidemia, osteoporosis, history of TIA.  Denies any recent chest pains. She has some dyspnea with exertion but also occasionally at rest. Symptoms are very intermittent.  Past Medical History  Diagnosis Date  . COLONIC POLYPS, ADENOMATOUS 12/26/2009  . DIABETES MELLITUS, TYPE II 08/11/2006  . HYPERLIPIDEMIA 09/09/2006  . ANEMIA, NORMOCYTIC 11/30/2009  . HYPERTENSION 09/09/2006  . CAD, NATIVE VESSEL 04/27/2008    Cardiac catheterization 6/08: EF 55%, 1+ MR, Dx 50%, LAD 50%, dLAD 40%, OM1 50-60% (up to 70%), pOM 30%, pRCA 40%, mRCA 50-60%, pPDA 50-60% (up to 70%).  Medical therapy was recommended  . LEFT BUNDLE BRANCH BLOCK  08/11/2006  . Atrial flutter ([place]) 04/12/2009  . TIA 09/02/2006    Echocardiogram 6/12: Mild to moderate MR, trace AI, trace TR, PASP 34, bubble study negative for intracardiac shunting, normal EF (greater than 55%)  . HEMORRHOIDS-INTERNAL 12/26/2009  . GERD 02/16/2007  . DIVERTICULOSIS, COLON 08/11/2006  . OSTEOARTHRITIS 08/11/2006  . OSTEOPOROSIS 08/11/2006  . Carotid stenosis     dopplers [id number]: LICA [id number]  . Hemorrhoids   . Stroke [facility]) 12-06-13   Past Surgical History  Procedure Laterality Date  . Tonsillectomy    . Eye surgery      catarac  . Closed reduction metatarsal fracture      right 5th  . Colonoscopy w/ polypectomy  2001  . Carotid endarterectomy Left 12-05-2010  . Loop recorder implant N/A 01/03/2014    Procedure: LOOP RECORDER IMPLANT;  Surgeon: Evans Lance, MD;  Location: [facility] CATH LAB;  Service: Cardiovascular;  Laterality: N/A;    reports that she has never smoked. She has never used smokeless tobacco. She reports that she does not drink alcohol or use illicit drugs. family history includes Breast cancer in her sister; Colon cancer in her brother; Congestive Heart Failure (age of onset: 67) in her father; Heart attack in her mother; Heart disease in her brother; Heart disease (age of onset: 44) in her mother; Kidney disease in her mother; Uterine cancer in her sister. Allergies  Allergen Reactions  . Nitrofurantoin     unknown  . Sulfamethoxazole-Trimethoprim     unknown  . Sulfonamide Derivatives     REACTION: rash       Review of Systems  Constitutional: Positive for fatigue. Negative for fever, chills and  unexpected weight change.  Eyes: Negative for visual disturbance.  Respiratory: Positive for shortness of breath. Negative for cough, chest tightness and wheezing.   Cardiovascular: Negative for chest pain, palpitations and leg swelling.  Gastrointestinal: Negative for abdominal pain.  Genitourinary: Negative for dysuria.  Neurological: Negative for  dizziness, seizures, syncope, weakness, light-headedness and headaches.  Psychiatric/Behavioral: The patient is nervous/anxious.        Objective:   Physical Exam  Constitutional: She is oriented to person, place, and time. She appears well-developed and well-nourished. No distress.  Neck: Neck supple. No thyromegaly present.  Cardiovascular: Normal rate and regular rhythm.   Pulmonary/Chest: Effort normal and breath sounds normal. She has no wheezes.  Musculoskeletal: She exhibits no edema.  Lymphadenopathy:    She has no cervical adenopathy.  Neurological: She is alert and oriented to person, place, and time.  Psychiatric: She has a normal mood and affect. Her behavior is normal.          Assessment & Plan:  #1 type 2 diabetes. Recent A1c 7.6%. At her age, we are not aiming for extremely tight control. Somewhat reluctant to use metformin with her age even though her renal functions been fairly stable. She is reluctant to take any medication this point. Repeat A1c in 3 months. If we do use sulfonylurea medications would use very low-dose to reduce risk of hypoglycemia  #2 dyspnea. Recent echo and CT angiogram as above. Pending appointment with pulmonology. Pulse oximetry today 96%. Question of mild emphysema component per x-rays. Also question some component of anxiety. She has never had significant hypoxemia documented Not convinced bronchodilator will help much. We did recommend trying Proair Respiclick as  she is having tremendous difficulties with technique with regular albuterol inhaler

## 2015-04-06 ENCOUNTER — Ambulatory Visit (INDEPENDENT_AMBULATORY_CARE_PROVIDER_SITE_OTHER): Payer: Medicare Other | Admitting: Pulmonary Disease

## 2015-04-06 ENCOUNTER — Encounter: Payer: Self-pay | Admitting: Pulmonary Disease

## 2015-04-06 VITALS — BP 142/68 | HR 84 | Ht 66.0 in | Wt 122.6 lb

## 2015-04-06 DIAGNOSIS — R0602 Shortness of breath: Secondary | ICD-10-CM

## 2015-04-06 DIAGNOSIS — J849 Interstitial pulmonary disease, unspecified: Secondary | ICD-10-CM

## 2015-04-06 DIAGNOSIS — R05 Cough: Secondary | ICD-10-CM | POA: Diagnosis not present

## 2015-04-06 DIAGNOSIS — R059 Cough, unspecified: Secondary | ICD-10-CM

## 2015-04-06 MED ORDER — IPRATROPIUM-ALBUTEROL 0.5-2.5 (3) MG/3ML IN SOLN
3.0000 mL | RESPIRATORY_TRACT | Status: DC | PRN
Start: 2015-04-06 — End: 2015-04-11

## 2015-04-06 MED ORDER — BUDESONIDE 0.25 MG/2ML IN SUSP: 0.2500 mg | Freq: Two times a day (BID) | RESPIRATORY_TRACT | Status: DC

## 2015-04-06 NOTE — Progress Notes (Signed)
Subjective:    Patient ID: [name], female    DOB: 10/19/1920, 82 y.o.   MRN: [id number]  HPI    This is the case of [name], 82 y.o. Female, who was referred by Dr.Burchette in consultation regarding dyspnea.   As you very well know, patient is a non smoker.  She did not necessarily  have lung issues until October 2016. She had dyspnea, generalized weakness, poor appetite, cough. She felt unwell. He was seen by her primary care doctor. Workup was negative. Chest x-ray did not reveal any acute process. Symptoms persisted. During Thanksgiving, she felt worse as far as her dyspnea is concerned. Patient ended up getting 2 rounds of antibiotics. She was also given albuterol puffer. There was concern about her technique was she was switched to albuterol Respimat. Overall, she is better compared to October but she still gets winded with exertion.  Patient had a chest CT scan.  Chest ct scan (03/16/15) --  honeycombing at the bases, worse from 2010. Bronchiectasis in both upper lung zones which is worse from 2010 chest CT scan. Subpleural fibrosis. These were seen in July 2010 CT scan but not as prominent. CT scan also showed COPD changes   Review of Systems  Constitutional: Negative.  Negative for fever and unexpected weight change.       Lost 10 lbs x 4 mos.   HENT: Negative.  Negative for congestion, dental problem, ear pain, nosebleeds, postnasal drip, rhinorrhea, sinus pressure, sneezing, sore throat and trouble swallowing.   Eyes: Negative.  Negative for redness and itching.  Respiratory: Positive for cough and shortness of breath. Negative for chest tightness and wheezing.   Cardiovascular: Negative.  Negative for palpitations and leg swelling.  Gastrointestinal: Negative.  Negative for nausea and vomiting.  Endocrine: Negative.   Genitourinary: Negative.  Negative for dysuria.  Musculoskeletal: Negative.  Negative for joint swelling.  Skin: Negative.  Negative for rash.    Allergic/Immunologic: Negative.   Neurological: Negative.  Negative for headaches.  Hematological: Negative.  Does not bruise/bleed easily.  Psychiatric/Behavioral: Negative.  Negative for dysphoric mood. The patient is not nervous/anxious.   All other systems reviewed and are negative.  Past Medical History  Diagnosis Date  . COLONIC POLYPS, ADENOMATOUS 12/26/2009  . DIABETES MELLITUS, TYPE II 08/11/2006  . HYPERLIPIDEMIA 09/09/2006  . ANEMIA, NORMOCYTIC 11/30/2009  . HYPERTENSION 09/09/2006  . CAD, NATIVE VESSEL 04/27/2008    Cardiac catheterization 6/08: EF 55%, 1+ MR, Dx 50%, LAD 50%, dLAD 40%, OM1 50-60% (up to 70%), pOM 30%, pRCA 40%, mRCA 50-60%, pPDA 50-60% (up to 70%).  Medical therapy was recommended  . LEFT BUNDLE BRANCH BLOCK 08/11/2006  . Atrial flutter ([place]) 04/12/2009  . TIA 09/02/2006    Echocardiogram 6/12: Mild to moderate MR, trace AI, trace TR, PASP 34, bubble study negative for intracardiac shunting, normal EF (greater than 55%)  . HEMORRHOIDS-INTERNAL 12/26/2009  . GERD 02/16/2007  . DIVERTICULOSIS, COLON 08/11/2006  . OSTEOARTHRITIS 08/11/2006  . OSTEOPOROSIS 08/11/2006  . Carotid stenosis     dopplers XX123456: LICA [id number]  . Hemorrhoids   . Stroke ([place]) 12-06-13   (-) CA. DVT.   Family History  Problem Relation Age of Onset  . Heart disease Mother 17  . Kidney disease Mother   . Heart attack Mother   . Congestive Heart Failure Father 15  . Breast cancer Sister   . Colon cancer Brother   . Heart disease Brother   . Uterine cancer  Sister      Past Surgical History  Procedure Laterality Date  . Tonsillectomy    . Eye surgery      catarac  . Closed reduction metatarsal fracture      right 5th  . Colonoscopy w/ polypectomy  2001  . Carotid endarterectomy Left 12-05-2010  . Loop recorder implant N/A 01/03/2014    Procedure: LOOP RECORDER IMPLANT;  Surgeon: Evans Lance, MD;  Location: [facility] CATH LAB;  Service: Cardiovascular;  Laterality: N/A;    Social  History   Social History  . Marital Status: Widowed    Spouse Name: N/A  . Number of Children: 3  . Years of Education: 12   Occupational History  .     Social History Main Topics  . Smoking status: Never Smoker   . Smokeless tobacco: Never Used  . Alcohol Use: No  . Drug Use: No  . Sexual Activity: Not on file   Other Topics Concern  . Not on file   Social History Narrative   Patient is a widow and lives alone.   Patient has three adult children.   Patient is retired.   Patient has a high school.   Patient is right-handed.   Patient does not drink any caffeine.   Worked in office.   Allergies  Allergen Reactions  . Nitrofurantoin     unknown  . Sulfamethoxazole-Trimethoprim     unknown  . Sulfonamide Derivatives     REACTION: rash     Outpatient Prescriptions Prior to Visit  Medication Sig Dispense Refill  . Ascorbic Acid (VITAMIN C PO) Take 1 tablet by mouth daily.     Marland Kitchen aspirin 81 MG tablet Take 81 mg by mouth daily.      Marland Kitchen atorvastatin (LIPITOR) 20 MG tablet Take 1 tablet (20 mg total) by mouth daily. 90 tablet 1  . calcium carbonate 200 MG capsule Take 200 mg by mouth daily.     . Cholecalciferol (VITAMIN D PO) Take 1 tablet by mouth daily.     . clopidogrel (PLAVIX) 75 MG tablet Take 75 mg by mouth daily.    . Cyanocobalamin (VITAMIN B 12 PO) Take 1 tablet by mouth daily.     . fish oil-omega-3 fatty acids 1000 MG capsule Take 1 capsule by mouth daily.     . furosemide (LASIX) 20 MG tablet Take 1 tablet (20 mg total) by mouth every other day. 90 tablet 3  . gabapentin (NEURONTIN) 100 MG capsule Take 100 mg by mouth daily as needed (nerve pain).    Marland Kitchen glimepiride (AMARYL) 1 MG tablet Take 1 tablet (1 mg total) by mouth daily with breakfast. 30 tablet 5  . glucose blood test strip Use once daily 100 each 3  . metoprolol tartrate (LOPRESSOR) 25 MG tablet Take 12.5 mg by mouth 2 (two) times daily.    . Multiple Vitamins-Minerals (MULTIVITAMIN WITH MINERALS)  tablet Take 1 tablet by mouth daily.    . ONE TOUCH LANCETS MISC Use daily as directed 200 each 3  . pantoprazole (PROTONIX) 20 MG tablet Take 20 mg by mouth daily as needed for heartburn or indigestion.     Marland Kitchen PROAIR RESPICLICK 123XX123 (90 Base) MCG/ACT AEPB Inhale 2 puffs into the lungs every 4 (four) hours as needed. 1 each 3  . VITAMIN E PO Take 1 tablet by mouth daily.     Marland Kitchen zolpidem (AMBIEN) 10 MG tablet Take 10 mg by mouth daily.  No facility-administered medications prior to visit.   Meds ordered this encounter  Medications  . DISCONTD: PROAIR HFA 108 (90 Base) MCG/ACT inhaler    Sig: Inhale 2 puffs into the lungs every 6 (six) hours as needed.  . budesonide (PULMICORT) 0.25 MG/2ML nebulizer solution    Sig: Take 2 mLs (0.25 mg total) by nebulization 2 (two) times daily at 10 AM and 5 PM.    Dispense:  60 mL    Refill:  12  . ipratropium-albuterol (DUONEB) 0.5-2.5 (3) MG/3ML SOLN    Sig: Take 3 mLs by nebulization every 4 (four) hours as needed.    Dispense:  360 mL    Refill:  5          Objective:   Physical Exam Vitals:  Filed Vitals:   04/06/15 1440 04/06/15 1441  BP: 142/68 142/68  Pulse: 84 84  Height: 5\' 6"  (1.676 m)   Weight: 122 lb 9.6 oz (55.611 kg)   SpO2: 96% 96%    Constitutional/General:  Pleasant, well-nourished, well-developed, not in any distress,  Comfortably seating.  Well kempt  Body mass index is 19.8 kg/(m^2). Wt Readings from Last 3 Encounters:  04/06/15 122 lb 9.6 oz (55.611 kg)  04/04/15 122 lb 12.8 oz (55.702 kg)  03/27/15 123 lb 1.9 oz (55.847 kg)      HEENT: Pupils equal and reactive to light and accommodation. Anicteric sclerae. Normal nasal mucosa.   No oral  lesions,  mouth clear,  oropharynx clear, no postnasal drip. (-) Oral thrush. No dental caries.  Airway - Mallampati class III  Neck: No masses. Midline trachea. No JVD, (-) LAD. (-) bruits appreciated.  Respiratory/Chest: Grossly normal chest. (-) deformity. (-)  Accessory muscle use.  Symmetric expansion. (-) Tenderness on palpation.  Resonant on percussion.  Diminished BS on both lower lung zones. (-) wheezing, crackles, rhonchi (-) egophony  Cardiovascular: Regular rate and  rhythm, heart sounds normal, no murmur or gallops, no peripheral edema  Gastrointestinal:  Normal bowel sounds. Soft, non-tender. No hepatosplenomegaly.  (-) masses.   Musculoskeletal:  Normal muscle tone. Normal gait.   Extremities: Grossly normal. (-) clubbing, cyanosis.  (-) edema  Skin: (-) rash,lesions seen.   Neurological/Psychiatric : alert, oriented to time, place, person. Normal mood and affect             Assessment & Plan:  ILD (interstitial lung disease) ([place]) Patient with recent dyspnea since October 2016. Chest ct scan (03/16/15) --  honeycombing at the bases, worse from 2010. Bronchiectasis in both upper lung zones which is worse from 2010 chest CT scan. Subpleural fibrosis. These were seen in July 2010 CT scan but not as prominent. CT scan also showed COPD changes. Patient is conservative. Wants to avoid extensive work up.  All images were personally reviewed.  P> 1. She is having issues with MDIs. Difficult with coordination. 2. Start Pulmicort twice a day. 3. Start duoneb  4 times a day 4. We demonstrated to her how to use the meds. 5. May have issues with medicine coverage.  SOB (shortness of breath) Recent dyspnea. Could be related to ILD. May need 2-D echo if not better.  Cough Better cough. Hold off on Flonase.    Thank you very much for letting me participate in this patient's care. Please do not hesitate to give me a call if you have any questions or concerns regarding the treatment plan.   Patient will follow up with me in 2 months  Monica Becton, MD Pulmonary and [place] Pager: [phone] Office: [phone], Fax: [phone]

## 2015-04-06 NOTE — Assessment & Plan Note (Addendum)
Recent dyspnea. Could be related to ILD. May need 2-D echo if not better.

## 2015-04-06 NOTE — Patient Instructions (Signed)
1. We will  order a nebulizer to help you with your breathing. Someone will call you regarding the machine. 2. We will start Pulmicort via nebulizer 2 times a day. 3. Start  DuoNeb via nebulizer 4 times a day and every 4 hours as needed. 4. Call the office if your  breathing gets worse.  Return to clinic in 2 months.   Monica Becton, MD [facility] Pager [phone] After 3pm or if no response, call [phone] Office: [phone], Fax: [phone]

## 2015-04-06 NOTE — Assessment & Plan Note (Signed)
Patient with recent dyspnea since October 2016. Chest ct scan (03/16/15) --  honeycombing at the bases, worse from 2010. Bronchiectasis in both upper lung zones which is worse from 2010 chest CT scan. Subpleural fibrosis. These were seen in July 2010 CT scan but not as prominent. CT scan also showed COPD changes. Patient is conservative. Wants to avoid extensive work up.  All images were personally reviewed.  P> 1. She is having issues with MDIs. Difficult with coordination. 2. Start Pulmicort twice a day. 3. Start duoneb  4 times a day 4. We demonstrated to her how to use the meds. 5. May have issues with medicine coverage.

## 2015-04-06 NOTE — Assessment & Plan Note (Signed)
Better cough. Hold off on Flonase.   

## 2015-04-07 ENCOUNTER — Telehealth: Payer: Self-pay | Admitting: Pulmonary Disease

## 2015-04-07 NOTE — Telephone Encounter (Signed)
Called spoke with Judson Roch from [facility]. Reports they received rx for nebulizer's but no orders. I am showing in chart this was supposed to be sent to [place].  Spoke with Vita Barley and she reports she did refax the nebulizer medications to [facility]. Nothing further needed

## 2015-04-10 ENCOUNTER — Telehealth: Payer: Self-pay | Admitting: Pulmonary Disease

## 2015-04-10 ENCOUNTER — Telehealth: Payer: Self-pay | Admitting: Family Medicine

## 2015-04-10 DIAGNOSIS — J849 Interstitial pulmonary disease, unspecified: Secondary | ICD-10-CM

## 2015-04-10 NOTE — Telephone Encounter (Signed)
Patient was schedule to have CT several weeks ago.  However, she never showed up.  CT has contacted multiples time and they have been unable to reach her.  As of today, pt still has not had CT done.

## 2015-04-10 NOTE — Telephone Encounter (Signed)
She has refused to go and I would not contact her further regarding CT at this time.

## 2015-04-10 NOTE — Telephone Encounter (Signed)
lmtcb x1 for pt. 

## 2015-04-11 ENCOUNTER — Encounter (HOSPITAL_COMMUNITY): Payer: Medicare Other

## 2015-04-11 MED ORDER — IPRATROPIUM-ALBUTEROL 0.5-2.5 (3) MG/3ML IN SOLN: RESPIRATORY_TRACT | Status: DC

## 2015-04-11 NOTE — Telephone Encounter (Signed)
Spoke with [facility] and they need clarification on Duoneb rx.  Per Dr Corrie Dandy: Patient Instructions       1. We will  order a nebulizer to help you with your breathing. Someone will call you regarding the machine. 2. We will start Pulmicort via nebulizer 2 times a day. 3. Start  DuoNeb via nebulizer 4 times a day and every 4 hours as needed. 4. Call the office if your  breathing gets worse.   Mandy needs new rx faxed with those specific instructions.  New rx printed and placed with form that Rodena Piety has for Dr Corrie Dandy to sign on 04/12/15.

## 2015-04-11 NOTE — Telephone Encounter (Signed)
Ron Agee [phone]) Needs the wording to say "q4 And" in the rx and we can fix the rx.

## 2015-04-12 NOTE — Telephone Encounter (Signed)
Spoke with pt, she is upset that her nebulizer meds have not yet been delivered to her.  I advised pt that this would be signed today. As I went to [name] to follow up on this rx, Rodena Piety was giving form to [place] from [place] to have [employer] sign so rx can be faxed.   Will forward to [facility] to document when this form has been signed.

## 2015-04-12 NOTE — Telephone Encounter (Signed)
Form has been signed and given to Rodena Piety to fax. Rxs were already sent to [place].

## 2015-04-14 ENCOUNTER — Telehealth: Payer: Self-pay | Admitting: Pulmonary Disease

## 2015-04-14 NOTE — Telephone Encounter (Signed)
Per 04/06/15 OV:  Patient Instructions       1. We will  order a nebulizer to help you with your breathing. Someone will call you regarding the machine. 2. We will start Pulmicort via nebulizer 2 times a day. 3. Start  DuoNeb via nebulizer 4 times a day and every 4 hours as needed. 4. Call the office if your  breathing gets worse.  Return to clinic in 2 months.   ---  Called spoke with pt. She wanted to confirm directions for budesonide and duoneb.  Made aware of above. She had no questions. Nothing further needed

## 2015-04-17 LAB — CUP PACEART REMOTE DEVICE CHECK: Date Time Interrogation Session: 20170116180622

## 2015-04-17 NOTE — Progress Notes (Signed)
Carelink summary report received. Battery status OK. Normal device function. No new symptom episodes, tachy episodes, brady, or pause episodes. No new AF episodes. Monthly summary reports and ROV/PRN 

## 2015-04-18 ENCOUNTER — Telehealth: Payer: Self-pay | Admitting: Pulmonary Disease

## 2015-04-18 MED ORDER — NYSTATIN 100000 UNIT/ML MT SUSP: 5.0000 mL | Freq: Two times a day (BID) | OROMUCOSAL | Status: DC

## 2015-04-18 NOTE — Telephone Encounter (Signed)
Stop using pulmicort for 5 days Nystatin 10k S& S 44ml twice daily x 7 days

## 2015-04-18 NOTE — Telephone Encounter (Signed)
Spoke with pt, aware of recs.  rx sent to preferred pharmacy.  Nothing further needed.  

## 2015-04-18 NOTE — Telephone Encounter (Signed)
Spoke with pt, states she has been getting sores in her mouth since starting to use nebulized medications.  Pt believes this is coming from her Pulmicort.  Pt has been rinsing mouth after using nebulized medications.  Pt does not have any white patches in mouth, but does not red raised bumps.  Pt requesting further recs.   Pt uses [facility] [address] to DOD since Dr. Corrie Dandy is on night float.   RA please advise.  Thanks!

## 2015-04-20 ENCOUNTER — Telehealth: Payer: Self-pay | Admitting: Family Medicine

## 2015-04-20 ENCOUNTER — Telehealth: Payer: Self-pay | Admitting: Pulmonary Disease

## 2015-04-20 MED ORDER — ZOLPIDEM TARTRATE 10 MG PO TABS: 10.0000 mg | ORAL_TABLET | Freq: Every day | ORAL | Status: DC

## 2015-04-20 NOTE — Telephone Encounter (Signed)
Pt request refill  zolpidem (AMBIEN) 10 MG tablet  [facility]

## 2015-04-20 NOTE — Telephone Encounter (Signed)
Printed for signature

## 2015-04-20 NOTE — Telephone Encounter (Signed)
Called and spoke to pt. Pt states she feels her SOB has slightly worsened since last OV with AD. Appt made with TP on 3.10.17 for an acute visit - pt states she is fine waiting till tomorrow for an appt and is aware to seek emergency care if s/s worsen. Pt verbalized understanding and denied any further questions or concerns at this time.

## 2015-04-20 NOTE — Telephone Encounter (Signed)
Signed and faxed

## 2015-04-20 NOTE — Telephone Encounter (Signed)
Last seen on 01/20/2016 Appts are up to date Please advise on refill

## 2015-04-20 NOTE — Telephone Encounter (Signed)
Refill with 3 additional refills  We have encouraged her to try to reduce to 5 mg in past but she has been unable.

## 2015-04-21 ENCOUNTER — Telehealth: Payer: Self-pay | Admitting: Adult Health

## 2015-04-21 ENCOUNTER — Encounter: Payer: Self-pay | Admitting: Adult Health

## 2015-04-21 ENCOUNTER — Ambulatory Visit (INDEPENDENT_AMBULATORY_CARE_PROVIDER_SITE_OTHER): Payer: Medicare Other | Admitting: Adult Health

## 2015-04-21 ENCOUNTER — Telehealth: Payer: Self-pay | Admitting: Internal Medicine

## 2015-04-21 VITALS — BP 114/60 | HR 76 | Temp 97.6°F | Ht 66.0 in | Wt 122.0 lb

## 2015-04-21 DIAGNOSIS — J849 Interstitial pulmonary disease, unspecified: Secondary | ICD-10-CM

## 2015-04-21 NOTE — Telephone Encounter (Signed)
Pt was seen if office today with TP. At ov pt requested results of ONO. We informed the patient that we have not received ONO results at this time but informed her that we would send a message to Dr. Tennis Must Dio's for results. Pt voiced understanding and had no further questions. Will send message to Dr. Tennis Must Dio's and Vita Barley for follow up.  Dr. Tennis Must Dio's please advise

## 2015-04-21 NOTE — Telephone Encounter (Signed)
New Message:  Pt called in stating that the was going to be disconnecting the link to her Loop recorder so she will not longer be having remote checks. Please f/u with   Thanks

## 2015-04-21 NOTE — Progress Notes (Signed)
   Subjective:    Patient ID: [name], female    DOB: 01-29-1921, 82 y.o.   MRN: [id number]  HPI 82 yo female seen for pulmonary consult 04/06/15 for dyspnea   TEST  CT chest  Chest ct scan (03/16/15) -- honeycombing at the bases, worse from 2010. Bronchiectasis in both upper lung zones which is worse from 2010 chest CT scan. Subpleural fibrosis. These were seen in July 2010 CT scan but not as prominent. CT scan also showed COPD changes   04/21/15 Acute OV : Dyspnea / ILD  Pt seen 2 weeks ago for consult for dsypnea.  CT cehst showed ILD changes. She did not want to undergo extensive workup . She was changed to pulmicort Twice daily  And Duoneb Four times a day .  Says she still have cough minimally productive with sinus drainge.  Denies any chest tightness/congestion, sinus drainage, fever, nausea or vomiting.  Does not like her nebs feels they are making her worse.  Pulmicort caused her to have sore mouth. She is currently using nystatin rinse with some help.  She denies chest pain or hemoptysis.   Review of Systems Constitutional:   No  weight loss, night sweats,  Fevers, chills,  +fatigue, or  lassitude.  HEENT:   No headaches,  Difficulty swallowing,  Tooth/dental problems, or  Sore throat,                No sneezing, itching, ear ache, nasal congestion, post nasal drip,   CV:  No chest pain,  Orthopnea, PND, swelling in lower extremities, anasarca, dizziness, palpitations, syncope.   GI  No heartburn, indigestion, abdominal pain, nausea, vomiting, diarrhea, change in bowel habits, loss of appetite, bloody stools.   Resp: .  No chest wall deformity  Skin: no rash or lesions.  GU: no dysuria, change in color of urine, no urgency or frequency.  No flank pain, no hematuria   MS:  No joint pain or swelling.  No decreased range of motion.  No back pain.  Psych:  No change in mood or affect. No depression or anxiety.  No memory loss.         Objective:   Physical  Exam  Filed Vitals:   04/21/15 1603  BP: 114/60  Pulse: 76  Temp: 97.6 F (36.4 C)  TempSrc: Oral  Height: 5\' 6"  (1.676 m)  Weight: 122 lb (55.339 kg)  SpO2: 99%   GEN: A/Ox3; pleasant , NAD, elderly   HEENT:  [place]/AT,  EACs-clear, TMs-wnl, NOSE-clear, THROAT-clear, no lesions, no postnasal drip or exudate noted.   NECK:  Supple w/ fair ROM; no JVD; normal carotid impulses w/o bruits; no thyromegaly or nodules palpated; no lymphadenopathy.  RESP  Decreased BS in bases no accessory muscle use, no dullness to percussion  CARD:  RRR, no m/r/g  , no peripheral edema, pulses intact, no cyanosis or clubbing.  GI:   Soft & nt; nml bowel sounds; no organomegaly or masses detected.  Musco: Warm bil, no deformities or joint swelling noted.   Neuro: alert, no focal deficits noted.    Skin: Warm, no lesions or rashes  Tammy Parrett NP-C  [facility]  04/20/15       Assessment & Plan:

## 2015-04-21 NOTE — Telephone Encounter (Signed)
Returned patient's call.  Advised patient to keep [employer] monitor in the event that she would like to resume monitoring in the future.  Clarified that as long as it remains unplugged, she will not be charged.  Patient verbalizes understanding of instructions and denies additional questions or concerns at this time.  Will route to Dr. Lovena Le for review.

## 2015-04-21 NOTE — Patient Instructions (Signed)
May stop Pulmicort Neb  May decrease Duoneb Three times a day  . Rinse after use.  Use Nystatin rinse for 1 week for mouth sores.  Follow up  Dr. Corrie Dandy as planned and As needed   Please contact office for sooner follow up if symptoms do not improve or worsen or seek emergency care

## 2015-04-24 ENCOUNTER — Other Ambulatory Visit: Payer: Self-pay | Admitting: Pulmonary Disease

## 2015-04-24 ENCOUNTER — Ambulatory Visit: Payer: Medicare Other | Admitting: Family Medicine

## 2015-04-24 DIAGNOSIS — J849 Interstitial pulmonary disease, unspecified: Secondary | ICD-10-CM

## 2015-04-24 NOTE — Progress Notes (Signed)
Per AD ONO ordered.

## 2015-04-25 NOTE — Telephone Encounter (Signed)
ONO re-ordered.

## 2015-04-26 ENCOUNTER — Ambulatory Visit: Payer: Medicare Other | Admitting: Family Medicine

## 2015-04-26 LAB — CUP PACEART REMOTE DEVICE CHECK: MDC IDC SESS DTM: 20170215180806

## 2015-04-26 NOTE — Progress Notes (Signed)
Carelink summary report received. Battery status OK. Normal device function. No new symptom episodes, tachy episodes, brady, or pause episodes. No new AF episodes. Monthly summary reports and ROV/PRN 

## 2015-04-27 NOTE — Assessment & Plan Note (Signed)
ILD changes on CT chest with ongoing dyspnea.  Intolerant to Nebs .  Will adjust doses and see if more tolerable   Plan  May stop Pulmicort Neb  May decrease Duoneb Three times a day  . Rinse after use.  Use Nystatin rinse for 1 week for mouth sores.  Follow up  Dr. Corrie Dandy as planned and As needed   Please contact office for sooner follow up if symptoms do not improve or worsen or seek emergency care

## 2015-04-28 ENCOUNTER — Ambulatory Visit (INDEPENDENT_AMBULATORY_CARE_PROVIDER_SITE_OTHER): Payer: Medicare Other | Admitting: *Deleted

## 2015-04-28 DIAGNOSIS — I6389 Other cerebral infarction: Secondary | ICD-10-CM

## 2015-04-28 DIAGNOSIS — I638 Other cerebral infarction: Secondary | ICD-10-CM | POA: Diagnosis not present

## 2015-04-28 NOTE — Progress Notes (Signed)
Carelink Summary Report / Loop Recorder 

## 2015-05-02 ENCOUNTER — Ambulatory Visit: Payer: Medicare Other | Admitting: Family Medicine

## 2015-05-03 ENCOUNTER — Telehealth: Payer: Self-pay | Admitting: Pulmonary Disease

## 2015-05-03 NOTE — Telephone Encounter (Signed)
05/03/15 ONO was U/R. Will update pt.

## 2015-05-03 NOTE — Telephone Encounter (Signed)
Telephone Encounter Info    Author Note Status Last Update User Last Update Date/Time   Rush Landmark, MD [place], MD 05/03/2015 1:53 PM    Telephone Encounter    Expand All Collapse All   05/03/15 ONO was U/R. Will update pt.      Spoke with patient and gave results. Pt also states that since having thrush she has not been using her nebulizer medications. Pt states that she seems to be doing fine right now. Advised pt that if/when she feels she may be SOB, chest tight or wheezing she can use the Duoneb q4h. Pt voiced understanding. No further questions or concerns. Routing to AD for review.

## 2015-05-10 ENCOUNTER — Telehealth: Payer: Self-pay | Admitting: Cardiology

## 2015-05-10 NOTE — Telephone Encounter (Signed)
LMOVM for pt requesting that she send a manual transmission w/ home monitor b/c her monitor has not updated in at least 14 days.

## 2015-05-15 LAB — HM DIABETES EYE EXAM

## 2015-05-17 ENCOUNTER — Encounter: Payer: Self-pay | Admitting: Family Medicine

## 2015-06-05 ENCOUNTER — Other Ambulatory Visit: Payer: Self-pay | Admitting: Cardiology

## 2015-06-06 ENCOUNTER — Ambulatory Visit (INDEPENDENT_AMBULATORY_CARE_PROVIDER_SITE_OTHER): Payer: Medicare Other | Admitting: Pulmonary Disease

## 2015-06-06 ENCOUNTER — Encounter: Payer: Self-pay | Admitting: Pulmonary Disease

## 2015-06-06 VITALS — BP 128/62 | HR 67 | Ht 66.0 in | Wt 122.0 lb

## 2015-06-06 DIAGNOSIS — R05 Cough: Secondary | ICD-10-CM | POA: Diagnosis not present

## 2015-06-06 DIAGNOSIS — J849 Interstitial pulmonary disease, unspecified: Secondary | ICD-10-CM | POA: Diagnosis not present

## 2015-06-06 DIAGNOSIS — R0602 Shortness of breath: Secondary | ICD-10-CM | POA: Diagnosis not present

## 2015-06-06 DIAGNOSIS — R059 Cough, unspecified: Secondary | ICD-10-CM

## 2015-06-06 NOTE — Patient Instructions (Signed)
1. Continue with duoneb 2-3x/d. Every 4 hrs if with shortness of breath. 2. Hold off on Pulmicort.  3. We will order you neb mask supplies.  Return to clinic in 6 mos.

## 2015-06-06 NOTE — Assessment & Plan Note (Addendum)
Patient with recent dyspnea since October 2016. Chest ct scan (03/16/15) --  honeycombing at the bases, worse from 2010. Bronchiectasis in both upper lung zones which is worse from 2010 chest CT scan. Subpleural fibrosis. These were seen in July 2010 CT scan but not as prominent. CT scan also showed COPD changes. Patient is conservative. Wants to avoid extensive work up.  All images were personally reviewed.  P> 1. Cont duoneb. May try 2-3x/d. Plan to wean off if not too SOB. 2. Hold off on pulmicort. Caused thrush.

## 2015-06-06 NOTE — Progress Notes (Signed)
Subjective:    Patient ID: [name], female    DOB: 19-Jan-1921, 82 y.o.   MRN: [id number]  HPI    This is the case of [name], 82 y.o. Female, who was referred by Dr.Burchette in consultation regarding dyspnea.   As you very well know, patient is a non smoker.  She did not necessarily  have lung issues until October 2016. She had dyspnea, generalized weakness, poor appetite, cough. She felt unwell. He was seen by her primary care doctor. Workup was negative. Chest x-ray did not reveal any acute process. Symptoms persisted. During Thanksgiving, she felt worse as far as her dyspnea is concerned. Patient ended up getting 2 rounds of antibiotics. She was also given albuterol puffer. There was concern about her technique was she was switched to albuterol Respimat. Overall, she is better compared to October but she still gets winded with exertion.  Patient had a chest CT scan.  Chest ct scan (03/16/15) --  honeycombing at the bases, worse from 2010. Bronchiectasis in both upper lung zones which is worse from 2010 chest CT scan. Subpleural fibrosis. These were seen in July 2010 CT scan but not as prominent. CT scan also showed COPD changes   ROV (06/06/15) Pt is here for f/u on her fibrosis and bronchiectasis. She stopped Pulmicort secondary to thrush. He feels better. Has not been admitted nor been on antibiotics since last seen.   Review of Systems  Constitutional: Negative.  Negative for fever and unexpected weight change.       Lost 10 lbs x 4 mos.   HENT: Negative.  Negative for congestion, dental problem, ear pain, nosebleeds, postnasal drip, rhinorrhea, sinus pressure, sneezing, sore throat and trouble swallowing.   Eyes: Negative.  Negative for redness and itching.  Respiratory: Positive for cough and shortness of breath. Negative for chest tightness and wheezing.   Cardiovascular: Negative.  Negative for palpitations and leg swelling.  Gastrointestinal: Negative.  Negative for  nausea and vomiting.  Endocrine: Negative.   Genitourinary: Negative.  Negative for dysuria.  Musculoskeletal: Negative.  Negative for joint swelling.  Skin: Negative.  Negative for rash.  Allergic/Immunologic: Negative.   Neurological: Negative.  Negative for headaches.  Hematological: Negative.  Does not bruise/bleed easily.  Psychiatric/Behavioral: Negative.  Negative for dysphoric mood. The patient is not nervous/anxious.   All other systems reviewed and are negative.  Past Medical History  Diagnosis Date  . COLONIC POLYPS, ADENOMATOUS 12/26/2009  . DIABETES MELLITUS, TYPE II 08/11/2006  . HYPERLIPIDEMIA 09/09/2006  . ANEMIA, NORMOCYTIC 11/30/2009  . HYPERTENSION 09/09/2006  . CAD, NATIVE VESSEL 04/27/2008    Cardiac catheterization 6/08: EF 55%, 1+ MR, Dx 50%, LAD 50%, dLAD 40%, OM1 50-60% (up to 70%), pOM 30%, pRCA 40%, mRCA 50-60%, pPDA 50-60% (up to 70%).  Medical therapy was recommended  . LEFT BUNDLE BRANCH BLOCK 08/11/2006  . Atrial flutter ([place]) 04/12/2009  . TIA 09/02/2006    Echocardiogram 6/12: Mild to moderate MR, trace AI, trace TR, PASP 34, bubble study negative for intracardiac shunting, normal EF (greater than 55%)  . HEMORRHOIDS-INTERNAL 12/26/2009  . GERD 02/16/2007  . DIVERTICULOSIS, COLON 08/11/2006  . OSTEOARTHRITIS 08/11/2006  . OSTEOPOROSIS 08/11/2006  . Carotid stenosis     dopplers [id number]: LICA [id number]  . Hemorrhoids   . Stroke ([place]) 12-06-13   (-) CA. DVT.   Family History  Problem Relation Age of Onset  . Heart disease Mother 32  . Kidney disease Mother   .  Heart attack Mother   . Congestive Heart Failure Father 51  . Breast cancer Sister   . Colon cancer Brother   . Heart disease Brother   . Uterine cancer Sister      Past Surgical History  Procedure Laterality Date  . Tonsillectomy    . Eye surgery      catarac  . Closed reduction metatarsal fracture      right 5th  . Colonoscopy w/ polypectomy  2001  . Carotid endarterectomy Left 12-05-2010  .  Loop recorder implant N/A 01/03/2014    Procedure: LOOP RECORDER IMPLANT;  Surgeon: Evans Lance, MD;  Location: [facility] CATH LAB;  Service: Cardiovascular;  Laterality: N/A;    Social History   Social History  . Marital Status: Widowed    Spouse Name: N/A  . Number of Children: 3  . Years of Education: 12   Occupational History  .     Social History Main Topics  . Smoking status: Never Smoker   . Smokeless tobacco: Never Used  . Alcohol Use: No  . Drug Use: No  . Sexual Activity: Not on file   Other Topics Concern  . Not on file   Social History Narrative   Patient is a widow and lives alone.   Patient has three adult children.   Patient is retired.   Patient has a high school.   Patient is right-handed.   Patient does not drink any caffeine.   Worked in office.   Allergies  Allergen Reactions  . Nitrofurantoin     unknown  . Sulfamethoxazole-Trimethoprim     unknown  . Sulfonamide Derivatives     REACTION: rash     Outpatient Prescriptions Prior to Visit  Medication Sig Dispense Refill  . Ascorbic Acid (VITAMIN C PO) Take 1 tablet by mouth daily.     Marland Kitchen aspirin 81 MG tablet Take 81 mg by mouth daily.      Marland Kitchen atorvastatin (LIPITOR) 20 MG tablet TAKE 1 TABLET DAILY 90 tablet 0  . calcium carbonate 200 MG capsule Take 200 mg by mouth daily.     . Cholecalciferol (VITAMIN D PO) Take 1 tablet by mouth daily.     . clopidogrel (PLAVIX) 75 MG tablet Take 75 mg by mouth daily.    . Cyanocobalamin (VITAMIN B 12 PO) Take 1 tablet by mouth daily.     . fish oil-omega-3 fatty acids 1000 MG capsule Take 1 capsule by mouth daily.     . furosemide (LASIX) 20 MG tablet Take 1 tablet (20 mg total) by mouth every other day. 90 tablet 3  . gabapentin (NEURONTIN) 100 MG capsule Take 100 mg by mouth daily as needed (nerve pain).    Marland Kitchen glimepiride (AMARYL) 1 MG tablet Take 1 tablet (1 mg total) by mouth daily with breakfast. 30 tablet 5  . glucose blood test strip Use once daily 100  each 3  . ipratropium-albuterol (DUONEB) 0.5-2.5 (3) MG/3ML SOLN Take 4 times a day and every 4 hours as needed for diff breathing 360 mL 5  . metoprolol tartrate (LOPRESSOR) 25 MG tablet Take 12.5 mg by mouth 2 (two) times daily.    . Multiple Vitamins-Minerals (MULTIVITAMIN WITH MINERALS) tablet Take 1 tablet by mouth daily.    Marland Kitchen nystatin (MYCOSTATIN) 100000 UNIT/ML suspension Take 5 mLs (500,000 Units total) by mouth 2 (two) times daily. 120 mL 0  . ONE TOUCH LANCETS MISC Use daily as directed 200 each 3  . pantoprazole (  PROTONIX) 20 MG tablet Take 20 mg by mouth daily as needed for heartburn or indigestion.     Marland Kitchen PROAIR RESPICLICK 123XX123 (90 Base) MCG/ACT AEPB Inhale 2 puffs into the lungs every 4 (four) hours as needed. 1 each 3  . VITAMIN E PO Take 1 tablet by mouth daily.     Marland Kitchen zolpidem (AMBIEN) 10 MG tablet Take 1 tablet (10 mg total) by mouth daily. 90 tablet 1  . budesonide (PULMICORT) 0.25 MG/2ML nebulizer solution Take 2 mLs (0.25 mg total) by nebulization 2 (two) times daily at 10 AM and 5 PM. (Patient not taking: Reported on 06/06/2015) 60 mL 12   No facility-administered medications prior to visit.   No orders of the defined types were placed in this encounter.          Objective:   Physical Exam Vitals:  Filed Vitals:   06/06/15 1109  BP: 128/62  Pulse: 67  Height: 5\' 6"  (1.676 m)  Weight: 122 lb (55.339 kg)  SpO2: 96%    Constitutional/General:  Pleasant, well-nourished, well-developed, not in any distress,  Comfortably seating.  Well kempt  Body mass index is 19.7 kg/(m^2). Wt Readings from Last 3 Encounters:  06/06/15 122 lb (55.339 kg)  04/21/15 122 lb (55.339 kg)  04/06/15 122 lb 9.6 oz (55.611 kg)      HEENT: Pupils equal and reactive to light and accommodation. Anicteric sclerae. Normal nasal mucosa.   No oral  lesions,  mouth clear,  oropharynx clear, no postnasal drip. (-) Oral thrush. No dental caries.  Airway - Mallampati class III  Neck: No  masses. Midline trachea. No JVD, (-) LAD. (-) bruits appreciated.  Respiratory/Chest: Grossly normal chest. (-) deformity. (-) Accessory muscle use.  Symmetric expansion. (-) Tenderness on palpation.  Resonant on percussion.  Diminished BS on both lower lung zones. (-) wheezing, crackles, rhonchi (-) egophony  Cardiovascular: Regular rate and  rhythm, heart sounds normal, no murmur or gallops, no peripheral edema  Gastrointestinal:  Normal bowel sounds. Soft, non-tender. No hepatosplenomegaly.  (-) masses.   Musculoskeletal:  Normal muscle tone. Normal gait.   Extremities: Grossly normal. (-) clubbing, cyanosis.  (-) edema  Skin: (-) rash,lesions seen.   Neurological/Psychiatric : alert, oriented to time, place, person. Normal mood and affect             Assessment & Plan:  ILD (interstitial lung disease) ([place]) Patient with recent dyspnea since October 2016. Chest ct scan (03/16/15) --  honeycombing at the bases, worse from 2010. Bronchiectasis in both upper lung zones which is worse from 2010 chest CT scan. Subpleural fibrosis. These were seen in July 2010 CT scan but not as prominent. CT scan also showed COPD changes. Patient is conservative. Wants to avoid extensive work up.  All images were personally reviewed.  P> 1. Cont duoneb. May try 2-3x/d. Plan to wean off if not too SOB. 2. Hold off on pulmicort. Caused thrush.     SOB (shortness of breath) Recent dyspnea. Could be related to ILD. May need 2-D echo if not better.    Cough Better cough. Hold off on Flonase.    Shortness of breath Recent dyspnea. Could be related to ILD. May need 2-D echo if not better.  SOB stable x 2 mos. Will observe.     Return to clinic in 6 mos       J. Shirl Harris, MD Pulmonary and [place] Pager: [phone] Office: [phone],  Fax: [phone]

## 2015-06-06 NOTE — Assessment & Plan Note (Signed)
Recent dyspnea. Could be related to ILD. May need 2-D echo if not better.  SOB stable x 2 mos. Will observe.

## 2015-06-06 NOTE — Assessment & Plan Note (Signed)
Better cough. Hold off on Flonase.   

## 2015-06-06 NOTE — Assessment & Plan Note (Signed)
Recent dyspnea. Could be related to ILD. May need 2-D echo if not better.

## 2015-06-13 ENCOUNTER — Telehealth: Payer: Self-pay | Admitting: Family Medicine

## 2015-06-13 NOTE — Telephone Encounter (Signed)
Pt needs PA for ambien call [phone] . [facility]

## 2015-06-16 NOTE — Telephone Encounter (Signed)
Constance Holster, Please check to see who is doing PA's at [facility] today.

## 2015-06-19 ENCOUNTER — Other Ambulatory Visit: Payer: Self-pay | Admitting: Family Medicine

## 2015-06-19 NOTE — Telephone Encounter (Signed)
PA for Ambien was denied. i am waiting on the fax from the insurance company to list alternative medications.

## 2015-06-26 ENCOUNTER — Encounter: Payer: Self-pay | Admitting: Physician Assistant

## 2015-06-26 ENCOUNTER — Ambulatory Visit (INDEPENDENT_AMBULATORY_CARE_PROVIDER_SITE_OTHER): Payer: Medicare Other | Admitting: Physician Assistant

## 2015-06-26 VITALS — BP 140/50 | HR 76 | Ht 66.0 in | Wt 121.1 lb

## 2015-06-26 DIAGNOSIS — J849 Interstitial pulmonary disease, unspecified: Secondary | ICD-10-CM | POA: Diagnosis not present

## 2015-06-26 DIAGNOSIS — I10 Essential (primary) hypertension: Secondary | ICD-10-CM

## 2015-06-26 DIAGNOSIS — I638 Other cerebral infarction: Secondary | ICD-10-CM | POA: Diagnosis not present

## 2015-06-26 DIAGNOSIS — I2510 Atherosclerotic heart disease of native coronary artery without angina pectoris: Secondary | ICD-10-CM | POA: Diagnosis not present

## 2015-06-26 DIAGNOSIS — I6529 Occlusion and stenosis of unspecified carotid artery: Secondary | ICD-10-CM

## 2015-06-26 DIAGNOSIS — I6389 Other cerebral infarction: Secondary | ICD-10-CM

## 2015-06-26 NOTE — Progress Notes (Signed)
Cardiology Office Note:    Date:  06/26/2015   ID:  [name], DOB 12/08/1920, MRN [id number]  PCP:  Eulas Post, MD  Cardiologist: Dr. Bing Quarry >> Dr. Ena Dawley >> Dr. Loralie Champagne  Electrophysiologist: Dr. Cristopher Peru  Pulmonologist:  Dr. Caralee Ates  Referring MD: Eulas Post, MD   Chief Complaint  Patient presents with  . Coronary Artery Disease    follow up    History of Present Illness:     [name] is a 82 y.o. female with a hx of non-obstructive CAD, prior TIA, carotid stenosis, s/p left CEA 11/2010, syncope, paroxysmal AFlutter, HTN, DM2, HL, LBBB. She has been felt to be a poor candidate for Coumadin. She had atrial fibrillation documented years ago but has not had a documented recurrence. She is not on anticoagulation due to concern for profuse hemorrhoidal bleeding. Last Cardiolite in 11/14 showed no ischemia or infarction. In 8/15, she had a syncopal event while standing in the kitchen. She was not unconscious for long. She then had 2 episodes concerning for TIAs. Both times, she lost a portion of her left visual field transiently. MRI after the 2nd episode showed no evidence for CVA. She was thought to have a TIA. She had an ophthalmology appointment and was told that there appears to be nothing wrong with her eyes themselves. Linq monitor was placed on 11/23 to look for atrial fibrillation.   Last seen by Dr. Loralie Champagne 5/16. She saw Dr. Cristopher Peru in 1/17. At that visit, she complained of weakness and dyspnea. ILR interrogation remained unrevealing. She was placed on Lasix with plans to follow up in 3-4 weeks. She was then admitted in 2/17 with worsening dyspnea. BNP was mildly elevated. D-dimer was also elevated. Chest CTA demonstrated no pulmonary embolism. There was extensive findings consistent with scarring. Discharge notes indicate that the patient's symptoms improved on diuretic therapy as well as  well as bronchodilator therapy. Echocardiogram demonstrated normal LV function with mild diastolic dysfunction. After discharge, she followed up with PCP. She had O2 sats drop to 90% with ambulation in the office. PFTs demonstrated suggestion of mild restriction. She was referred to pulmonology. Of note, she had a ESR of 71 in 1/17. SPEP/UPEP was non-specific.   I saw her in FU in 2/17.  I ordered a [employer] but she decided not to do this.    She saw pulmonology in 2/17. Conservative therapy was recommended for her interstitial lung disease. Returns for FU.    She tells me she feels better on her current pulmonary regimen.  She denies chest pain, syncope. Denies significant edema.  Denies any bleeding issues.     Past Medical History  Diagnosis Date  . COLONIC POLYPS, ADENOMATOUS 12/26/2009  . DIABETES MELLITUS, TYPE II 08/11/2006  . HYPERLIPIDEMIA 09/09/2006  . ANEMIA, NORMOCYTIC 11/30/2009  . HYPERTENSION 09/09/2006  . CAD, NATIVE VESSEL 04/27/2008    Cardiac catheterization 6/08: EF 55%, 1+ MR, Dx 50%, LAD 50%, dLAD 40%, OM1 50-60% (up to 70%), pOM 30%, pRCA 40%, mRCA 50-60%, pPDA 50-60% (up to 70%).  Medical therapy was recommended  . LEFT BUNDLE BRANCH BLOCK 08/11/2006  . Atrial flutter ([place]) 04/12/2009  . TIA 09/02/2006    Echocardiogram 6/12: Mild to moderate MR, trace AI, trace TR, PASP 34, bubble study negative for intracardiac shunting, normal EF (greater than 55%)  . HEMORRHOIDS-INTERNAL 12/26/2009  . GERD 02/16/2007  . DIVERTICULOSIS, COLON 08/11/2006  . OSTEOARTHRITIS 08/11/2006  .  OSTEOPOROSIS 08/11/2006  . Carotid stenosis     dopplers 2/40: LICA 97-35%  . Hemorrhoids   . Stroke ([place]) 12-06-13  1. H/o TIA: Possible TIA in 10/15 with visual field cut. MRI (10/15) with no CVA. Linq monitor placed.  2. Carotid stenosis: Left CEA in 10/12. Carotid dopplers (10/15) with 1-39% bilateral ICA stenosis.  3. Atrial fibrillation: Paroxysmal, only prior documented episode was  years ago. She was not started on anticoagulation back then due to hemorrhoidal bleeding, apparently profuse. Event monitor (3/14) showed only NSR.  4. Hemorrhoids s/p banding.  5. HTN 6. Type II diabetes 7. Hyperlipidemia 8. LBBB 9. CAD: Nonobstructive. LHC (6/08) with EF 55%, 1+ MR, Dx 50%, LAD 50%, dLAD 40%, OM1 50-60% (up to 70%), pOM 30%, pRCA 40%, mRCA 50-60%, pPDA 50-60% (up to 70%). Adenosine Cardiolite (11/14) with EF 67%, no ischemia or infarction.  10. Syncope: 8/15. EEG (5/15) was unremarkable. 11. Echo (4/14) with EF 60-65%, mild MR, aortic sclerosis without stenosis.    Past Surgical History  Procedure Laterality Date  . Tonsillectomy    . Eye surgery      catarac  . Closed reduction metatarsal fracture      right 5th  . Colonoscopy w/ polypectomy  2001  . Carotid endarterectomy Left 12-05-2010  . Loop recorder implant N/A 01/03/2014    Procedure: LOOP RECORDER IMPLANT;  Surgeon: Evans Lance, MD;  Location: [facility] CATH LAB;  Service: Cardiovascular;  Laterality: N/A;    Current Medications: Outpatient Prescriptions Prior to Visit  Medication Sig Dispense Refill  . Ascorbic Acid (VITAMIN C PO) Take 1 tablet by mouth daily.     Marland Kitchen aspirin 81 MG tablet Take 81 mg by mouth daily.      Marland Kitchen atorvastatin (LIPITOR) 20 MG tablet TAKE 1 TABLET DAILY 90 tablet 0  . calcium carbonate 200 MG capsule Take 200 mg by mouth daily.     . Cholecalciferol (VITAMIN D PO) Take 1 tablet by mouth daily.     . clopidogrel (PLAVIX) 75 MG tablet Take 75 mg by mouth daily.    . Cyanocobalamin (VITAMIN B 12 PO) Take 1 tablet by mouth daily.     . fish oil-omega-3 fatty acids 1000 MG capsule Take 1 capsule by mouth daily.     . furosemide (LASIX) 20 MG tablet Take 1 tablet (20 mg total) by mouth every other day. 90 tablet 3  . gabapentin (NEURONTIN) 100 MG capsule Take 100 mg by mouth daily as needed (nerve pain).    Marland Kitchen glucose blood test strip Use once daily 100 each 3  .  ipratropium-albuterol (DUONEB) 0.5-2.5 (3) MG/3ML SOLN Take 4 times a day and every 4 hours as needed for diff breathing 360 mL 5  . metoprolol tartrate (LOPRESSOR) 25 MG tablet TAKE ONE-HALF (1/2) TABLET TWICE A DAY 90 tablet 1  . Multiple Vitamins-Minerals (MULTIVITAMIN WITH MINERALS) tablet Take 1 tablet by mouth daily.    Marland Kitchen nystatin (MYCOSTATIN) 100000 UNIT/ML suspension Take 5 mLs (500,000 Units total) by mouth 2 (two) times daily. 120 mL 0  . ONE TOUCH LANCETS MISC Use daily as directed 200 each 3  . pantoprazole (PROTONIX) 20 MG tablet Take 20 mg by mouth daily as needed for heartburn or indigestion.     Marland Kitchen PROAIR RESPICLICK 329 (90 Base) MCG/ACT AEPB Inhale 2 puffs into the lungs every 4 (four) hours as needed. 1 each 3  . VITAMIN E PO Take 1 tablet by mouth daily.     Marland Kitchen  zolpidem (AMBIEN) 10 MG tablet Take 1 tablet (10 mg total) by mouth daily. 90 tablet 1  . budesonide (PULMICORT) 0.25 MG/2ML nebulizer solution Take 2 mLs (0.25 mg total) by nebulization 2 (two) times daily at 10 AM and 5 PM. (Patient not taking: Reported on 06/06/2015) 60 mL 12  . glimepiride (AMARYL) 1 MG tablet Take 1 tablet (1 mg total) by mouth daily with breakfast. (Patient not taking: Reported on 06/26/2015) 30 tablet 5   No facility-administered medications prior to visit.      Allergies:   Nitrofurantoin; Sulfamethoxazole-trimethoprim; and Sulfonamide derivatives   Social History   Social History  . Marital Status: Widowed    Spouse Name: N/A  . Number of Children: 3  . Years of Education: 12   Occupational History  .     Social History Main Topics  . Smoking status: Never Smoker   . Smokeless tobacco: Never Used  . Alcohol Use: No  . Drug Use: No  . Sexual Activity: Not Asked   Other Topics Concern  . None   Social History Narrative   Patient is a widow and lives alone.   Patient has three adult children.   Patient is retired.   Patient has a high school.   Patient is right-handed.   Patient  does not drink any caffeine.     Family History:  The patient's family history includes Breast cancer in her sister; Colon cancer in her brother; Congestive Heart Failure (age of onset: 74) in her father; Heart attack in her mother; Heart disease in her brother; Heart disease (age of onset: 49) in her mother; Kidney disease in her mother; Uterine cancer in her sister.   ROS:   Please see the history of present illness.    Review of Systems  Constitution: Positive for decreased appetite and weight loss.   All other systems reviewed and are negative.   Physical Exam:    VS:  BP 140/50 mmHg  Pulse 76  Ht 5' 6"  (1.676 m)  Wt 121 lb 1.9 oz (54.94 kg)  BMI 19.56 kg/m2   GEN: Well nourished, well developed, in no acute distress HEENT: normal Neck: no JVD, no masses Cardiac: Normal S1/S2, RRR; no murmurs, rubs, or gallops, no edema;     Respiratory:  clear to auscultation bilaterally; no wheezing, rhonchi or rales GI: soft, nontender, nondistended MS: no deformity or atrophy Skin: warm and dry Neuro: No focal deficits  Psych: Alert and oriented x 3, normal affect  Wt Readings from Last 3 Encounters:  06/26/15 121 lb 1.9 oz (54.94 kg)  06/06/15 122 lb (55.339 kg)  04/21/15 122 lb (55.339 kg)      Studies/Labs Reviewed:     EKG:  EKG is not ordered today.  The ekg ordered today demonstrates n/a  Recent Labs: 03/10/2015: ALT 20 03/15/2015: B Natriuretic Peptide 141.1*; Hemoglobin 11.6*; Platelets 221; TSH 5.736* 03/22/2015: BUN 30*; Creatinine, Ser 1.14; Potassium 4.1; Sodium 135   Recent Lipid Panel    Component Value Date/Time   CHOL 116 04/21/2014 0857   TRIG 111.0 04/21/2014 0857   TRIG 80 01/13/2006 1010   HDL 47.10 04/21/2014 0857   CHOLHDL 2 04/21/2014 0857   CHOLHDL 2.8 CALC 01/13/2006 1010   VLDL 22.2 04/21/2014 0857   LDLCALC 47 04/21/2014 0857    Additional studies/ records that were reviewed today include:   Echo 03/16/15 Mild LVH, mild focal basal septal  hypertrophy, EF 55-60%, no RWMA, Gr 1 DD, MAC, mild  MR, mild LAE  Carotid US 10/16 RICA < 40% L CEA patent  Adenosine Myoview (12/23/12): Impression  Exercise Capacity: Adenosine study with no exercise.  BP Response: Normal blood pressure response.  Clinical Symptoms: Atypical chest pain.  ECG Impression: No significant ST segment change suggestive of ischemia.  Comparison with Prior Nuclear Study: No images to compare  Overall Impression: Normal stress nuclear study.  LV Ejection Fraction: 67%. LV Wall Motion: NL LV Function; NL Wall Motion  Echo 05/2012:  EF 60-65%, normal wall motion, Gr 1 DD, MAC, mild MR, PASP 32, reduced excursion of AV noncoronary cusp.   Carotid US 5/14:  patent L CEA, RICA < 40%. Event Monitor 3/14: NSR.  LHC 6/08:  EF 55%, 1+ MR,  Dx 50%, LAD 50%, dLAD 40% OM1 50-60% (up to 70%), pOM 30% pRCA 40%, mRCA 50-60%, pPDA 50-60% (up to 70%).  Medical Rx was recommended.    ASSESSMENT:     1. Coronary artery disease involving native coronary artery of native heart without angina pectoris   2. ILD (interstitial lung disease) ([place])   3. Cerebrovascular accident (CVA) due to other mechanism ([place])   4. Essential hypertension   5. Carotid stenosis, unspecified laterality     PLAN:     In order of problems listed above:  1. CAD -  No angina.  Continue ASA, Plavix, statin, beta-blocker.  2. ILD - FU with Pulmonology as planned.   3. Possible TIA - Recurrent AFib to date has not been identified on ILR. She would likely need anticoagulation resumed if ILR documents AFib. However, she has decided to stop following up on her ILR.  4. HTN - Controlled.   5. Carotid stenosis - Followed by [facility].   Medication Adjustments/Labs and Tests Ordered: Current medicines are reviewed at length with the patient today.  Concerns regarding medicines are outlined above.  Medication changes, Labs and Tests ordered today are outlined in the Patient Instructions noted  below. Patient Instructions  Medication Instructions:  Your physician recommends that you continue on your current medications as directed. Please refer to the Current Medication list given to you today. Labwork: NONE Testing/Procedures: NONE Follow-Up: Your physician wants you to follow-up in: [place], PAC OR DR. Aundra Dubin You will receive a reminder letter in the mail two months in advance. If you don't receive a letter, please call our office to schedule the follow-up appointment. Any Other Special Instructions Will Be Listed Below (If Applicable). If you need a refill on your cardiac medications before your next appointment, please call your pharmacy.   Signed, [name], PA-C  06/26/2015 3:21 PM    [facility] [address], [place], [place]  [address] Phone: [phone]; Fax: [phone]

## 2015-06-26 NOTE — Patient Instructions (Addendum)
Medication Instructions:  Your physician recommends that you continue on your current medications as directed. Please refer to the Current Medication list given to you today. Labwork: NONE Testing/Procedures: NONE Follow-Up: Your physician wants you to follow-up in: [place], PAC OR DR. Aundra Dubin You will receive a reminder letter in the mail two months in advance. If you don't receive a letter, please call our office to schedule the follow-up appointment. Any Other Special Instructions Will Be Listed Below (If Applicable). If you need a refill on your cardiac medications before your next appointment, please call your pharmacy.

## 2015-06-27 ENCOUNTER — Ambulatory Visit (INDEPENDENT_AMBULATORY_CARE_PROVIDER_SITE_OTHER): Payer: Medicare Other | Admitting: Family Medicine

## 2015-06-27 VITALS — BP 128/60 | HR 73 | Temp 97.8°F | Ht 66.0 in | Wt 122.0 lb

## 2015-06-27 DIAGNOSIS — M19041 Primary osteoarthritis, right hand: Secondary | ICD-10-CM | POA: Diagnosis not present

## 2015-06-27 DIAGNOSIS — E118 Type 2 diabetes mellitus with unspecified complications: Secondary | ICD-10-CM | POA: Diagnosis not present

## 2015-06-27 DIAGNOSIS — J849 Interstitial pulmonary disease, unspecified: Secondary | ICD-10-CM | POA: Diagnosis not present

## 2015-06-27 DIAGNOSIS — M19042 Primary osteoarthritis, left hand: Secondary | ICD-10-CM | POA: Diagnosis not present

## 2015-06-27 LAB — POCT GLYCOSYLATED HEMOGLOBIN (HGB A1C): HEMOGLOBIN A1C: 6.4

## 2015-06-27 NOTE — Progress Notes (Signed)
Pre visit review using our clinic review tool, if applicable. No additional management support is needed unless otherwise documented below in the visit note. 

## 2015-06-27 NOTE — Progress Notes (Signed)
Subjective:    Patient ID: [name], female    DOB: 05-04-1920, 82 y.o.   MRN: [id number]  HPI Patient seen for medical follow-up  Chronic problems include history of type 2 diabetes, TIAs , osteoporosis, osteoarthritis , interstitial lung disease, chronic insomnia , CAD , history of atrial flutter. She has recently been followed by pulmonary and was placed on DuoNeb which she does think has helped her breathing. She still has some dyspnea with exertion but no chest pains.   Type 2 diabetes. Last A1c 7.6%. No polyuria or polydipsia. Weight and appetite are stable. Does not monitor home blood sugars very often. Currently taking no medications for diabetes   Chronic insomnia. We've tried in the past tapering her off Ambien but she has been unable to do so.   She complains of arthritis involving her hands- especially her thumbs. Some days worse than others. Sometimes has stiffness. No severe pain. No erythema or warmth.  Past Medical History  Diagnosis Date  . COLONIC POLYPS, ADENOMATOUS 12/26/2009  . DIABETES MELLITUS, TYPE II 08/11/2006  . HYPERLIPIDEMIA 09/09/2006  . ANEMIA, NORMOCYTIC 11/30/2009  . HYPERTENSION 09/09/2006  . CAD, NATIVE VESSEL 04/27/2008    Cardiac catheterization 6/08: EF 55%, 1+ MR, Dx 50%, LAD 50%, dLAD 40%, OM1 50-60% (up to 70%), pOM 30%, pRCA 40%, mRCA 50-60%, pPDA 50-60% (up to 70%).  Medical therapy was recommended  . LEFT BUNDLE BRANCH BLOCK 08/11/2006  . Atrial flutter ([place]) 04/12/2009  . TIA 09/02/2006    Echocardiogram 6/12: Mild to moderate MR, trace AI, trace TR, PASP 34, bubble study negative for intracardiac shunting, normal EF (greater than 55%)  . HEMORRHOIDS-INTERNAL 12/26/2009  . GERD 02/16/2007  . DIVERTICULOSIS, COLON 08/11/2006  . OSTEOARTHRITIS 08/11/2006  . OSTEOPOROSIS 08/11/2006  . Carotid stenosis     dopplers [id number]: LICA [id number]  . Hemorrhoids   . Stroke [facility]) 12-06-13   Past Surgical History  Procedure Laterality Date  . Tonsillectomy      . Eye surgery      catarac  . Closed reduction metatarsal fracture      right 5th  . Colonoscopy w/ polypectomy  2001  . Carotid endarterectomy Left 12-05-2010  . Loop recorder implant N/A 01/03/2014    Procedure: LOOP RECORDER IMPLANT;  Surgeon: Evans Lance, MD;  Location: [facility] CATH LAB;  Service: Cardiovascular;  Laterality: N/A;    reports that she has never smoked. She has never used smokeless tobacco. She reports that she does not drink alcohol or use illicit drugs. family history includes Breast cancer in her sister; Colon cancer in her brother; Congestive Heart Failure (age of onset: 25) in her father; Heart attack in her mother; Heart disease in her brother; Heart disease (age of onset: 76) in her mother; Kidney disease in her mother; Uterine cancer in her sister. Allergies  Allergen Reactions  . Nitrofurantoin     unknown  . Sulfamethoxazole-Trimethoprim     unknown  . Sulfonamide Derivatives     REACTION: rash  ]   Review of Systems  Constitutional: Negative for fatigue.  Eyes: Negative for visual disturbance.  Respiratory: Negative for cough, chest tightness and wheezing.   Cardiovascular: Negative for chest pain, palpitations and leg swelling.  Endocrine: Negative for polydipsia and polyuria.  Neurological: Negative for dizziness, seizures, syncope, weakness, light-headedness and headaches.       Objective:   Physical Exam  Constitutional: She appears well-developed and well-nourished.  Neck: Neck supple.  Cardiovascular: Normal rate  and regular rhythm.   Pulmonary/Chest: Effort normal and breath sounds normal. No respiratory distress. She has no wheezes. She has no rales.  Musculoskeletal: She exhibits no edema.  Lymphadenopathy:    She has no cervical adenopathy.          Assessment & Plan:   #1 type 2 diabetes. Marginal control. Recheck A1c. Not aiming for tight control with her age and multiple co-morbidities.  A1C 6.4%.     #2 chronic dyspnea with  interstitial lung disease. Stable on nebulizers. Continue pulmonary follow-up   #3 osteoarthritis involving mostly her hands. Avoid non-steroidals at her age. She will try some Tylenol as needed  Eulas Post MD [facility]

## 2015-06-29 ENCOUNTER — Telehealth: Payer: Self-pay | Admitting: Family Medicine

## 2015-06-29 NOTE — Telephone Encounter (Signed)
PA doe zolpidem is denied as it is a plan exclusion.

## 2015-07-01 ENCOUNTER — Other Ambulatory Visit: Payer: Self-pay | Admitting: Family Medicine

## 2015-07-02 NOTE — Telephone Encounter (Signed)
She has been on this for many years.  Is there anyway to get this approved?  We should give pt a heads up that they may not be covering and I would advise that she try to reduce to 5 mg qhs with the remaining supply she has left.

## 2015-07-03 ENCOUNTER — Ambulatory Visit: Payer: Medicare Other | Admitting: Family Medicine

## 2015-07-03 NOTE — Telephone Encounter (Signed)
Pt does not want to reduce the medication, she feels it would not be affective since she has took 10mg  1qhs for years now. Please advise on the appeals process for her insurance. Thanks.

## 2015-07-07 LAB — CUP PACEART REMOTE DEVICE CHECK: Date Time Interrogation Session: 20170317183931

## 2015-07-12 NOTE — Telephone Encounter (Signed)
So sorry for the delay - I am just getting to PA messages. No, a plan exclusion means that they will not cover the medication at all.

## 2015-07-13 MED ORDER — ZOLPIDEM TARTRATE 5 MG PO TABS: 10.0000 mg | ORAL_TABLET | Freq: Every day | ORAL | Status: DC

## 2015-07-13 NOTE — Telephone Encounter (Signed)
I have updated her medication list.

## 2015-07-25 ENCOUNTER — Telehealth: Payer: Self-pay | Admitting: Family Medicine

## 2015-07-25 NOTE — Telephone Encounter (Signed)
Last refill of 10mg  of Ambien was 04/20/2015 #90, 1 rf. Pt never received the refill bc insurance does not want to pay for 10mg , they want her to switch to the 5mg  tablet.  She refuses to switch to the 5mg  tablet and is wanting to pay out of pocket for #90 of the 10mg  tablets. She says that she is trying to cut back on the mg but some nights she needs 10mg .  Pt has 6 tablets left so refill would not be due until Monday. Okay to fill?

## 2015-07-25 NOTE — Telephone Encounter (Signed)
Due for refill on 07/31/2015.

## 2015-07-25 NOTE — Telephone Encounter (Signed)
OK 

## 2015-07-25 NOTE — Telephone Encounter (Signed)
Patient dropped off a handicap placard and left a note for Dr. Elease Hashimoto to send zolpidem (AMBIEN) 5 MG tablet to [facility]. Ambien 10 Mg 90 days, She has 6 left. [facility] is cheaper than [facility]. I left the note attached to the handicap placard if you need to go to that for reference.

## 2015-07-31 MED ORDER — ZOLPIDEM TARTRATE 10 MG PO TABS: 10.0000 mg | ORAL_TABLET | Freq: Every evening | ORAL | Status: DC | PRN

## 2015-07-31 NOTE — Telephone Encounter (Signed)
Verbally called in for patient.  

## 2015-07-31 NOTE — Telephone Encounter (Signed)
Will verbally call in medication for patient at 10am to [facility] when they open.

## 2015-08-17 ENCOUNTER — Emergency Department (HOSPITAL_COMMUNITY)
Admission: EM | Admit: 2015-08-17 | Discharge: 2015-08-17 | Disposition: A | Discharge status: Home / Self Care | Payer: Medicare Other | Attending: Emergency Medicine | Admitting: Emergency Medicine

## 2015-08-17 ENCOUNTER — Encounter (HOSPITAL_COMMUNITY): Payer: Self-pay | Admitting: Emergency Medicine

## 2015-08-17 DIAGNOSIS — M1990 Unspecified osteoarthritis, unspecified site: Secondary | ICD-10-CM | POA: Diagnosis not present

## 2015-08-17 DIAGNOSIS — Z8673 Personal history of transient ischemic attack (TIA), and cerebral infarction without residual deficits: Secondary | ICD-10-CM | POA: Diagnosis not present

## 2015-08-17 DIAGNOSIS — S61217A Laceration without foreign body of left little finger without damage to nail, initial encounter: Secondary | ICD-10-CM | POA: Insufficient documentation

## 2015-08-17 DIAGNOSIS — Y9389 Activity, other specified: Secondary | ICD-10-CM | POA: Diagnosis not present

## 2015-08-17 DIAGNOSIS — Z8679 Personal history of other diseases of the circulatory system: Secondary | ICD-10-CM | POA: Insufficient documentation

## 2015-08-17 DIAGNOSIS — Z955 Presence of coronary angioplasty implant and graft: Secondary | ICD-10-CM | POA: Diagnosis not present

## 2015-08-17 DIAGNOSIS — Y929 Unspecified place or not applicable: Secondary | ICD-10-CM | POA: Insufficient documentation

## 2015-08-17 DIAGNOSIS — E119 Type 2 diabetes mellitus without complications: Secondary | ICD-10-CM | POA: Diagnosis not present

## 2015-08-17 DIAGNOSIS — E785 Hyperlipidemia, unspecified: Secondary | ICD-10-CM | POA: Insufficient documentation

## 2015-08-17 DIAGNOSIS — Y999 Unspecified external cause status: Secondary | ICD-10-CM | POA: Diagnosis not present

## 2015-08-17 DIAGNOSIS — Z79899 Other long term (current) drug therapy: Secondary | ICD-10-CM | POA: Insufficient documentation

## 2015-08-17 DIAGNOSIS — Z7982 Long term (current) use of aspirin: Secondary | ICD-10-CM | POA: Insufficient documentation

## 2015-08-17 DIAGNOSIS — IMO0002 Reserved for concepts with insufficient information to code with codable children: Secondary | ICD-10-CM

## 2015-08-17 DIAGNOSIS — W07XXXA Fall from chair, initial encounter: Secondary | ICD-10-CM | POA: Diagnosis not present

## 2015-08-17 DIAGNOSIS — I10 Essential (primary) hypertension: Secondary | ICD-10-CM | POA: Insufficient documentation

## 2015-08-17 DIAGNOSIS — Z7902 Long term (current) use of antithrombotics/antiplatelets: Secondary | ICD-10-CM | POA: Diagnosis not present

## 2015-08-17 DIAGNOSIS — M810 Age-related osteoporosis without current pathological fracture: Secondary | ICD-10-CM | POA: Insufficient documentation

## 2015-08-17 DIAGNOSIS — S6992XA Unspecified injury of left wrist, hand and finger(s), initial encounter: Secondary | ICD-10-CM | POA: Diagnosis present

## 2015-08-17 MED ORDER — LIDOCAINE HCL 2 % IJ SOLN
20.0000 mL | Freq: Once | INTRAMUSCULAR | Status: AC
  Administered 2015-08-17: 400 mg
  Filled 2015-08-17: qty 20

## 2015-08-17 MED ORDER — TETANUS-DIPHTH-ACELL PERTUSSIS 5-2.5-18.5 LF-MCG/0.5 IM SUSP
0.5000 mL | Freq: Once | INTRAMUSCULAR | Status: AC
  Administered 2015-08-17: 0.5 mL via INTRAMUSCULAR
  Filled 2015-08-17: qty 0.5

## 2015-08-17 MED ORDER — BACITRACIN ZINC 500 UNIT/GM EX OINT
TOPICAL_OINTMENT | CUTANEOUS | Status: AC
  Administered 2015-08-17: 1
  Filled 2015-08-17: qty 0.9

## 2015-08-17 NOTE — ED Notes (Addendum)
Pt answered for Joya Gaskins' while in lobby even after name being called twice. Writer only realized mistake once pt had been in room for a few minutes.

## 2015-08-17 NOTE — ED Notes (Signed)
Pt states she was using a "sliding chair" taking the trash out and the arm on the chair broke and she fell cutting her left pinky finger on the transfer casing of the sliding chair.  Denies loss of consciousness. A&O on assessment.

## 2015-08-17 NOTE — ED Provider Notes (Signed)
CSN: [id number]     Arrival date & time 08/17/15  1857 History   First MD Initiated Contact with Patient 08/17/15 1932     Chief Complaint  Patient presents with  . Finger Injury   HPI   89 YOF presents today with laceration to her  Left distal lateral pinky finger. Patient reports that she is on blood thinners, minimal bleeding but difficult to control. Blood has stopped by the time my evaluation. Patient denies any numbness tingling or weakness in the finger full active range of motion. Patient reports that she was using a sliding chair to take the trash out when the arm fell off causing her to cut the finger on metal. Uncertain date of last tetanus.  Past Medical History  Diagnosis Date  . COLONIC POLYPS, ADENOMATOUS 12/26/2009  . DIABETES MELLITUS, TYPE II 08/11/2006  . HYPERLIPIDEMIA 09/09/2006  . ANEMIA, NORMOCYTIC 11/30/2009  . HYPERTENSION 09/09/2006  . CAD, NATIVE VESSEL 04/27/2008    Cardiac catheterization 6/08: EF 55%, 1+ MR, Dx 50%, LAD 50%, dLAD 40%, OM1 50-60% (up to 70%), pOM 30%, pRCA 40%, mRCA 50-60%, pPDA 50-60% (up to 70%).  Medical therapy was recommended  . LEFT BUNDLE BRANCH BLOCK 08/11/2006  . Atrial flutter ([place]) 04/12/2009  . TIA 09/02/2006    Echocardiogram 6/12: Mild to moderate MR, trace AI, trace TR, PASP 34, bubble study negative for intracardiac shunting, normal EF (greater than 55%)  . HEMORRHOIDS-INTERNAL 12/26/2009  . GERD 02/16/2007  . DIVERTICULOSIS, COLON 08/11/2006  . OSTEOARTHRITIS 08/11/2006  . OSTEOPOROSIS 08/11/2006  . Carotid stenosis     dopplers [id number]: LICA [id number]  . Hemorrhoids   . Stroke [facility]) 12-06-13   Past Surgical History  Procedure Laterality Date  . Tonsillectomy    . Eye surgery      catarac  . Closed reduction metatarsal fracture      right 5th  . Colonoscopy w/ polypectomy  2001  . Carotid endarterectomy Left 12-05-2010  . Loop recorder implant N/A 01/03/2014    Procedure: LOOP RECORDER IMPLANT;  Surgeon: Evans Lance, MD;   Location: [facility] C[facility] LAB;  Service: Cardiovascular;  Laterality: N/A;   Family History  Problem Relation Age of Onset  . Heart disease Mother 52  . Kidney disease Mother   . Heart attack Mother   . Congestive Heart Failure Father 17  . Breast cancer Sister   . Colon cancer Brother   . Heart disease Brother   . Uterine cancer Sister    Social History  Substance Use Topics  . Smoking status: Never Smoker   . Smokeless tobacco: Never Used  . Alcohol Use: No   OB History    No data available     Review of Systems  All other systems reviewed and are negative.   Allergies  Nitrofurantoin; Sulfamethoxazole-trimethoprim; and Sulfonamide derivatives  Home Medications   Prior to Admission medications   Medication Sig Start Date End Date Taking? Authorizing Provider  Ascorbic Acid (VITAMIN C PO) Take 1 tablet by mouth daily.     Historical Provider, MD  aspirin 81 MG tablet Take 81 mg by mouth daily.      Historical Provider, MD  atorvastatin (LIPITOR) 20 MG tablet TAKE 1 TABLET DAILY 06/06/15   Larey Dresser, MD  calcium carbonate 200 MG capsule Take 200 mg by mouth daily.     Historical Provider, MD  Cholecalciferol (VITAMIN D PO) Take 1 tablet by mouth daily.     Historical Provider, MD  clopidogrel (PLAVIX) 75 MG tablet TAKE 1 TABLET DAILY 07/03/15   Eulas Post, MD  Cyanocobalamin (VITAMIN B 12 PO) Take 1 tablet by mouth daily.     Historical Provider, MD  fish oil-omega-3 fatty acids 1000 MG capsule Take 1 capsule by mouth daily.     Historical Provider, MD  furosemide (LASIX) 20 MG tablet Take 1 tablet (20 mg total) by mouth every other day. 03/20/15   Velvet Bathe, MD  gabapentin (NEURONTIN) 100 MG capsule Take 100 mg by mouth daily as needed (nerve pain).    Historical Provider, MD  glucose blood test strip Use once daily 12/01/14   Eulas Post, MD  ipratropium-albuterol (DUONEB) 0.5-2.5 (3) MG/3ML SOLN Take 4 times a day and every 4 hours as needed for diff  breathing 04/11/15   [place], MD  metoprolol tartrate (LOPRESSOR) 25 MG tablet TAKE ONE-HALF (1/2) TABLET TWICE A DAY 06/19/15   Eulas Post, MD  Multiple Vitamins-Minerals (MULTIVITAMIN WITH MINERALS) tablet Take 1 tablet by mouth daily.    Historical Provider, MD  nystatin (MYCOSTATIN) 100000 UNIT/ML suspension Take 5 mLs (500,000 Units total) by mouth 2 (two) times daily. 04/18/15   Rigoberto Noel, MD  ONE TOUCH LANCETS MISC Use daily as directed 03/25/11   Eulas Post, MD  pantoprazole (PROTONIX) 20 MG tablet Take 20 mg by mouth daily as needed for heartburn or indigestion.     Historical Provider, MD  PROAIR RESPICLICK [id number] Base) MCG/ACT AEPB Inhale 2 puffs into the lungs every 4 (four) hours as needed. 04/04/15   Eulas Post, MD  VITAMIN E PO Take 1 tablet by mouth daily.     Historical Provider, MD  zolpidem (AMBIEN) 10 MG tablet Take 1 tablet (10 mg total) by mouth at bedtime as needed for sleep. 07/31/15 08/30/15  Eulas Post, MD   BP 177/72 mmHg  Pulse 83  Temp(Src) 97.9 F (36.6 C) (Oral)  Resp 14  SpO2 95%   Physical Exam  Constitutional: She is oriented to person, place, and time. She appears well-developed and well-nourished.  HENT:  Head: Normocephalic and atraumatic.  Eyes: Conjunctivae are normal. Pupils are equal, round, and reactive to light. Right eye exhibits no discharge. Left eye exhibits no discharge. No scleral icterus.  Neck: Normal range of motion. No JVD present. No tracheal deviation present.  Pulmonary/Chest: Effort normal. No stridor.  Musculoskeletal:  One centimeter the middle laceration to the left lateral pinky, no deep involvement, sensation intact, cap refill intact  Neurological: She is alert and oriented to person, place, and time. Coordination normal.  Psychiatric: She has a normal mood and affect. Her behavior is normal. Judgment and thought content normal.  Nursing note and vitals reviewed.      ED Course   Procedures (including critical care time)  LACERATION REPAIR Performed by: Elmer Ramp Authorized by: Elmer Ramp Consent: Verbal consent obtained. Risks and benefits: risks, benefits and alternatives were discussed Consent given by: patient Patient identity confirmed: provided demographic data Prepped and Draped in normal sterile fashion Wound explored  Laceration Location: Left pinky  Laceration Length: 1 cm  No Foreign Bodies seen or palpated  Anesthesia: Digital block   Local anesthetic: lidocaine 1 % 0 epinephrine  Anesthetic total: 2 ml  Irrigation method: syringe Amount of cleaning: standard  Skin closure: Simple   Number of sutures: 6   Technique: Simple interrupted   Patient tolerance: Patient tolerated the procedure well with  no immediate complications. Labs Review Labs Reviewed - No data to display  Imaging Review No results found. I have personally reviewed and evaluated these images and lab results as part of my medical decision-making.   EKG Interpretation None      MDM   Final diagnoses:  Laceration    Labs:   Imaging:  Consults:  Therapeutics:T dap  Discharge Meds:   Assessment/Plan:Simple laceration repaired without complication. Return precautions given, patient verbalized understanding and agreement today's plan.       Okey Regal, PA-C 08/17/15 2157  Daleen Bo, MD 08/18/15 [phone]

## 2015-08-17 NOTE — ED Notes (Signed)
On assessment pt's finger is lacerated to the lateral side of the pinky finger.  Bleeding is not completely controlled at this time.  Redressed finger.

## 2015-09-04 ENCOUNTER — Other Ambulatory Visit: Payer: Self-pay | Admitting: Cardiology

## 2015-09-05 ENCOUNTER — Telehealth: Payer: Self-pay | Admitting: Family Medicine

## 2015-09-05 DIAGNOSIS — R29898 Other symptoms and signs involving the musculoskeletal system: Secondary | ICD-10-CM

## 2015-09-05 NOTE — Telephone Encounter (Signed)
I think that would be a great idea.  OK to set up.

## 2015-09-05 NOTE — Telephone Encounter (Signed)
Pt would like to know if dr Elease Hashimoto will approve for her to have physical therapy for leg weakness. Pt would like to go to  [facility] [phone]. Pt has medicare

## 2015-09-06 NOTE — Telephone Encounter (Signed)
Referral ordered to be scheduled.

## 2015-09-06 NOTE — Telephone Encounter (Signed)
Pt is aware that referral has been ordered.

## 2015-09-23 ENCOUNTER — Other Ambulatory Visit: Payer: Self-pay | Admitting: Family Medicine

## 2015-11-08 ENCOUNTER — Ambulatory Visit (INDEPENDENT_AMBULATORY_CARE_PROVIDER_SITE_OTHER)
Admission: RE | Admit: 2015-11-08 | Discharge: 2015-11-08 | Disposition: A | Discharge status: Home / Self Care | Payer: Medicare Other | Source: Ambulatory Visit | Attending: Family Medicine | Admitting: Family Medicine

## 2015-11-08 ENCOUNTER — Ambulatory Visit (INDEPENDENT_AMBULATORY_CARE_PROVIDER_SITE_OTHER): Payer: Medicare Other | Admitting: Family Medicine

## 2015-11-08 ENCOUNTER — Telehealth: Payer: Self-pay | Admitting: Family Medicine

## 2015-11-08 VITALS — BP 160/68 | HR 83 | Temp 98.1°F | Ht 66.0 in | Wt 123.5 lb

## 2015-11-08 DIAGNOSIS — M25561 Pain in right knee: Secondary | ICD-10-CM

## 2015-11-08 DIAGNOSIS — Z23 Encounter for immunization: Secondary | ICD-10-CM

## 2015-11-08 DIAGNOSIS — M79601 Pain in right arm: Secondary | ICD-10-CM | POA: Diagnosis not present

## 2015-11-08 DIAGNOSIS — M79604 Pain in right leg: Secondary | ICD-10-CM | POA: Diagnosis not present

## 2015-11-08 NOTE — Telephone Encounter (Signed)
[facility] - Client [facility] Patient Name: [name] DOB: March 14, 1920 Initial Comment Caller states c/o right knee. Nurse Assessment Nurse: Dimas Chyle, RN, Dellis Filbert Date/Time Eilene Ghazi Time): 11/08/2015 9:46:04 AM Confirm and document reason for call. If symptomatic, describe symptoms. You must click the next button to save text entered. ---Caller states c/o right knee. Symptoms started on Tuesday morning. No bruising. Having difficulty walking. Has the patient traveled out of the country within the last 30 days? ---No Does the patient have any new or worsening symptoms? ---Yes Will a triage be completed? ---Yes Related visit to physician within the last 2 weeks? ---No Does the PT have any chronic conditions? (i.e. diabetes, asthma, etc.) ---Yes List chronic conditions. ---Diabetes type 2 and hx of TIAs Is this a behavioral health or substance abuse call? ---No Guidelines Guideline Title Affirmed Question Affirmed Notes Knee Pain [1] MODERATE pain (e.g., interferes with normal activities, limping) AND [2] present > 3 days Final Disposition User See PCP When Office is Open (within 3 days) Dimas Chyle, RN, [employer] Referrals REFERRED TO PCP OFFICE Disagree/Comply: Leta Baptist

## 2015-11-08 NOTE — Progress Notes (Signed)
Subjective:     Patient ID: [name], female   DOB: 1920/04/25, 82 y.o.   MRN: [id number]  HPI Patient seen with right lower extremity pain. She did a lot of yard work over the weekend but denies injury. She noticed onset of pain which is very poorly localized yesterday mostly around the right knee region with some pain proximal and distal. She denies any back pain whatsoever. Denies any lower extremity numbness or weakness. No urine or stool incontinence. She notices that her pain is worse with movement but not at rest. She slept well last night. Denies any leg edema.. Does not describe any claudication-type symptoms. She did take some Tylenol last night which may have helped and she slept well. She denies any hip pain.  Past Medical History:  Diagnosis Date  . ANEMIA, NORMOCYTIC 11/30/2009  . Atrial flutter ([place]) 04/12/2009  . CAD, NATIVE VESSEL 04/27/2008   Cardiac catheterization 6/08: EF 55%, 1+ MR, Dx 50%, LAD 50%, dLAD 40%, OM1 50-60% (up to 70%), pOM 30%, pRCA 40%, mRCA 50-60%, pPDA 50-60% (up to 70%).  Medical therapy was recommended  . Carotid stenosis    dopplers [id number]: LICA [id number]  . COLONIC POLYPS, ADENOMATOUS 12/26/2009  . DIABETES MELLITUS, TYPE II 08/11/2006  . DIVERTICULOSIS, COLON 08/11/2006  . GERD 02/16/2007  . Hemorrhoids   . HEMORRHOIDS-INTERNAL 12/26/2009  . HYPERLIPIDEMIA 09/09/2006  . HYPERTENSION 09/09/2006  . LEFT BUNDLE BRANCH BLOCK 08/11/2006  . OSTEOARTHRITIS 08/11/2006  . OSTEOPOROSIS 08/11/2006  . Stroke ([place]) 12-06-13  . TIA 09/02/2006   Echocardiogram 6/12: Mild to moderate MR, trace AI, trace TR, PASP 34, bubble study negative for intracardiac shunting, normal EF (greater than 55%)   Past Surgical History:  Procedure Laterality Date  . CAROTID ENDARTERECTOMY Left 12-05-2010  . CLOSED REDUCTION METATARSAL FRACTURE     right 5th  . COLONOSCOPY W/ POLYPECTOMY  2001  . EYE SURGERY     catarac  . LOOP RECORDER IMPLANT N/A 01/03/2014   Procedure: LOOP  RECORDER IMPLANT;  Surgeon: Evans Lance, MD;  Location: [facility] CATH LAB;  Service: Cardiovascular;  Laterality: N/A;  . TONSILLECTOMY      reports that she has never smoked. She has never used smokeless tobacco. She reports that she does not drink alcohol or use drugs. family history includes Breast cancer in her sister; Colon cancer in her brother; Congestive Heart Failure (age of onset: 24) in her father; Heart attack in her mother; Heart disease in her brother; Heart disease (age of onset: 35) in her mother; Kidney disease in her mother; Uterine cancer in her sister. Allergies  Allergen Reactions  . Nitrofurantoin     unknown  . Sulfamethoxazole-Trimethoprim     unknown  . Sulfonamide Derivatives     REACTION: rash     Review of Systems  Constitutional: Negative for chills and fever.  Respiratory: Negative for shortness of breath.   Cardiovascular: Negative for chest pain.  Gastrointestinal: Negative for abdominal pain.  Genitourinary: Negative for flank pain.  Musculoskeletal: Negative for back pain and joint swelling.  Skin: Negative for rash.  Neurological: Negative for dizziness, weakness and numbness.       Objective:   Physical Exam  Constitutional: She appears well-developed and well-nourished.  Cardiovascular: Normal rate and regular rhythm.   Pulmonary/Chest: Effort normal and breath sounds normal. No respiratory distress.  Musculoskeletal: She exhibits no edema.  Straight leg raise are negative. She has no edema right lower extremity. No calf tenderness. Full range  of motion right knee. No warmth or erythema. No localized tenderness. Right hip she has somewhat restricted range of motion but no reproducible pain with internal or external rotation. No visible leg edema or erythema.  Feet are warm to touch with excellent capillary refill and excellent dorsalis pedis pulse  Neurological:  Symmetric reflexes ankle and knee with full strength of plantarflexion,  dorsiflexion, and knee extension bilaterally  Skin: No rash noted.       Assessment:     Right lower extremity pain. She does not describe claudication symptoms and exam would not suggest this. She does not have anything to suggest likely DVT. She denies any low back pain but given the fact that her pain is from the thigh down below the knee consider possible neurogenic pain. She does not have straight leg raises and non-focal neurologic exam.    Plan:     -Start with x-rays of the right knee -Consider Tylenol 2 every 6 hours as needed for pain -If pain not relieved with Tylenol, consider adding back gabapentin 100 mg up to 3 times a day -Follow-up promptly for new lower extremity swelling, weakness, progressive pain o/w follow-up in one week to reassess  Eulas Post MD [facility]

## 2015-11-08 NOTE — Progress Notes (Signed)
Pre visit review using our clinic review tool, if applicable. No additional management support is needed unless otherwise documented below in the visit note. 

## 2015-11-08 NOTE — Telephone Encounter (Signed)
Appointment to see Dr. Elease Hashimoto at 1:45pm today

## 2015-11-08 NOTE — Patient Instructions (Signed)
Try some Tylenol for pain as needed May also take Gabapentin up to one three times daily as needed Follow up for any increased leg edema or other concerns.

## 2015-11-09 ENCOUNTER — Telehealth: Payer: Self-pay | Admitting: Family Medicine

## 2015-11-09 NOTE — Telephone Encounter (Signed)
Pt returning your call concerning xray results.

## 2015-11-10 ENCOUNTER — Telehealth: Payer: Self-pay | Admitting: Pulmonary Disease

## 2015-11-10 ENCOUNTER — Other Ambulatory Visit: Payer: Self-pay

## 2015-11-10 DIAGNOSIS — M79604 Pain in right leg: Secondary | ICD-10-CM

## 2015-11-10 DIAGNOSIS — R0600 Dyspnea, unspecified: Secondary | ICD-10-CM

## 2015-11-10 DIAGNOSIS — J849 Interstitial pulmonary disease, unspecified: Secondary | ICD-10-CM

## 2015-11-10 NOTE — Telephone Encounter (Signed)
Spoke with pt, requesting to switch DME companies from [place] [facility] to [facility] for her nebulizer.  Pt stated multiple times in conversation that she is unable to come in for an OV d/t being incapacitated.   Order placed to switch from [place] to [facility].  Nothing further needed.

## 2015-11-10 NOTE — Telephone Encounter (Signed)
Please see results note annotations.

## 2015-11-13 ENCOUNTER — Other Ambulatory Visit: Payer: Self-pay | Admitting: Family Medicine

## 2015-11-14 ENCOUNTER — Telehealth: Payer: Self-pay | Admitting: Family Medicine

## 2015-11-14 NOTE — Telephone Encounter (Signed)
Opal Sidles needs verbal orders for PT

## 2015-11-14 NOTE — Telephone Encounter (Signed)
OK 

## 2015-11-14 NOTE — Telephone Encounter (Signed)
[name] is aware.

## 2015-11-16 ENCOUNTER — Ambulatory Visit (INDEPENDENT_AMBULATORY_CARE_PROVIDER_SITE_OTHER): Payer: Medicare Other | Admitting: Family Medicine

## 2015-11-16 VITALS — BP 130/60 | HR 83 | Temp 98.0°F | Ht 66.0 in | Wt 123.0 lb

## 2015-11-16 DIAGNOSIS — M25561 Pain in right knee: Secondary | ICD-10-CM

## 2015-11-16 NOTE — Progress Notes (Signed)
Pre visit review using our clinic review tool, if applicable. No additional management support is needed unless otherwise documented below in the visit note. 

## 2015-11-16 NOTE — Progress Notes (Signed)
Subjective:     Patient ID: [name], female   DOB: 1920-09-05, 82 y.o.   MRN: [id number]  HPI Patient seen for follow-up regarding right lower extremity pain. Refer to recent note. She had been doing lots of yard work. She been sitting in a squatted position for a prolonged period. No specific injury. She had severe pain which is somewhat poorly localized on the right knee region but no visible swelling or ecchymosis. X-rays of the knee revealed very well preserved joint space and no acute abnormality. She is currently getting home physical therapy and was taking Tylenol and pain has greatly improved. She has only very minimal pain with ambulation and changing positions. No low back pain. No weakness.  Past Medical History:  Diagnosis Date  . ANEMIA, NORMOCYTIC 11/30/2009  . Atrial flutter ([place]) 04/12/2009  . CAD, NATIVE VESSEL 04/27/2008   Cardiac catheterization 6/08: EF 55%, 1+ MR, Dx 50%, LAD 50%, dLAD 40%, OM1 50-60% (up to 70%), pOM 30%, pRCA 40%, mRCA 50-60%, pPDA 50-60% (up to 70%).  Medical therapy was recommended  . Carotid stenosis    dopplers [id number]: LICA [id number]  . COLONIC POLYPS, ADENOMATOUS 12/26/2009  . DIABETES MELLITUS, TYPE II 08/11/2006  . DIVERTICULOSIS, COLON 08/11/2006  . GERD 02/16/2007  . Hemorrhoids   . HEMORRHOIDS-INTERNAL 12/26/2009  . HYPERLIPIDEMIA 09/09/2006  . HYPERTENSION 09/09/2006  . LEFT BUNDLE BRANCH BLOCK 08/11/2006  . OSTEOARTHRITIS 08/11/2006  . OSTEOPOROSIS 08/11/2006  . Stroke ([place]) 12-06-13  . TIA 09/02/2006   Echocardiogram 6/12: Mild to moderate MR, trace AI, trace TR, PASP 34, bubble study negative for intracardiac shunting, normal EF (greater than 55%)   Past Surgical History:  Procedure Laterality Date  . CAROTID ENDARTERECTOMY Left 12-05-2010  . CLOSED REDUCTION METATARSAL FRACTURE     right 5th  . COLONOSCOPY W/ POLYPECTOMY  2001  . EYE SURGERY     catarac  . LOOP RECORDER IMPLANT N/A 01/03/2014   Procedure: LOOP RECORDER IMPLANT;   Surgeon: Evans Lance, MD;  Location: [facility] CATH LAB;  Service: Cardiovascular;  Laterality: N/A;  . TONSILLECTOMY      reports that she has never smoked. She has never used smokeless tobacco. She reports that she does not drink alcohol or use drugs. family history includes Breast cancer in her sister; Colon cancer in her brother; Congestive Heart Failure (age of onset: 70) in her father; Heart attack in her mother; Heart disease in her brother; Heart disease (age of onset: 94) in her mother; Kidney disease in her mother; Uterine cancer in her sister. Allergies  Allergen Reactions  . Nitrofurantoin     unknown  . Sulfamethoxazole-Trimethoprim     unknown  . Sulfonamide Derivatives     REACTION: rash     Review of Systems  Musculoskeletal: Negative for back pain.  Neurological: Negative for weakness and numbness.       Objective:   Physical Exam  Constitutional: She appears well-developed and well-nourished.  Cardiovascular: Normal rate and regular rhythm.   Pulmonary/Chest: Effort normal and breath sounds normal. No respiratory distress. She has no wheezes. She has no rales.  Musculoskeletal:  Straight leg raise is negative on the right. She has no knee effusion. No ecchymosis. Full range of motion right knee. No localized bony tenderness. No leg edema       Assessment:     Recent right lower extremity pain following yard work. Unremarkable x-ray. Suspect muscle strain/soft tissue inflammation-greatly improved    Plan:     -  Continue Tylenol as needed -Finish course of home physical therapy -Follow-up for any recurrent symptoms, otherwise in 3 months  Eulas Post MD [facility] at [facility]

## 2015-12-01 ENCOUNTER — Encounter: Payer: Self-pay | Admitting: Vascular Surgery

## 2015-12-05 ENCOUNTER — Encounter: Payer: Self-pay | Admitting: Vascular Surgery

## 2015-12-05 ENCOUNTER — Ambulatory Visit (INDEPENDENT_AMBULATORY_CARE_PROVIDER_SITE_OTHER): Payer: Medicare Other | Admitting: Vascular Surgery

## 2015-12-05 ENCOUNTER — Ambulatory Visit (HOSPITAL_COMMUNITY)
Admission: RE | Admit: 2015-12-05 | Discharge: 2015-12-05 | Disposition: A | Discharge status: Home / Self Care | Payer: Medicare Other | Source: Ambulatory Visit | Attending: Vascular Surgery | Admitting: Vascular Surgery

## 2015-12-05 VITALS — BP 156/64 | HR 68 | Temp 97.6°F | Resp 18 | Ht 66.0 in | Wt 125.9 lb

## 2015-12-05 DIAGNOSIS — I6522 Occlusion and stenosis of left carotid artery: Secondary | ICD-10-CM

## 2015-12-05 DIAGNOSIS — I6523 Occlusion and stenosis of bilateral carotid arteries: Secondary | ICD-10-CM | POA: Diagnosis not present

## 2015-12-05 LAB — VAS US CAROTID
LCCADDIAS: 13 cm/s
LCCAPDIAS: 14 cm/s
LEFT ECA DIAS: 0 cm/s
LEFT VERTEBRAL DIAS: 5 cm/s
LICADDIAS: -19 cm/s
LICAPDIAS: 14 cm/s
Left CCA dist sys: 84 cm/s
Left CCA prox sys: 93 cm/s
Left ICA dist sys: -80 cm/s
Left ICA prox sys: 52 cm/s
RCCAPSYS: 95 cm/s
RIGHT CCA MID DIAS: 11 cm/s
RIGHT ECA DIAS: -1 cm/s
RIGHT VERTEBRAL DIAS: 10 cm/s
Right CCA prox dias: 11 cm/s
Right cca dist sys: -96 cm/s

## 2015-12-05 NOTE — Progress Notes (Signed)
[facility] [place]  Patient name: [name] MRN: [id number] DOB: 11-May-1920 Sex: female  REASON FOR VISIT: Follow-up left carotid endarterectomy October 2012  HPI: [name] is a 82 y.o. female here today for follow-up. She is here today with her son. She looks quite good especially at her age of 88. She's had no neurologic deficits. Does report a sensation of fullness in her left neck. This had not been present in years past. She denies any amaurosis fugax, transient ischemic attack or stroke.  Past Medical History:  Diagnosis Date  . ANEMIA, NORMOCYTIC 11/30/2009  . Atrial flutter ([place]) 04/12/2009  . CAD, NATIVE VESSEL 04/27/2008   Cardiac catheterization 6/08: EF 55%, 1+ MR, Dx 50%, LAD 50%, dLAD 40%, OM1 50-60% (up to 70%), pOM 30%, pRCA 40%, mRCA 50-60%, pPDA 50-60% (up to 70%).  Medical therapy was recommended  . Carotid stenosis    dopplers [id number]: LICA [id number]  . COLONIC POLYPS, ADENOMATOUS 12/26/2009  . DIABETES MELLITUS, TYPE II 08/11/2006  . DIVERTICULOSIS, COLON 08/11/2006  . GERD 02/16/2007  . Hemorrhoids   . HEMORRHOIDS-INTERNAL 12/26/2009  . HYPERLIPIDEMIA 09/09/2006  . HYPERTENSION 09/09/2006  . LEFT BUNDLE BRANCH BLOCK 08/11/2006  . OSTEOARTHRITIS 08/11/2006  . OSTEOPOROSIS 08/11/2006  . Stroke ([place]) 12-06-13  . TIA 09/02/2006   Echocardiogram 6/12: Mild to moderate MR, trace AI, trace TR, PASP 34, bubble study negative for intracardiac shunting, normal EF (greater than 55%)    Family History  Problem Relation Age of Onset  . Heart disease Mother 7  . Kidney disease Mother   . Heart attack Mother   . Congestive Heart Failure Father 52  . Colon cancer Brother   . Heart disease Brother   . Breast cancer Sister   . Uterine cancer Sister     SOCIAL HISTORY: Social History  Substance Use Topics  . Smoking status: Never Smoker  . Smokeless tobacco: Never Used  . Alcohol use No    Allergies    Allergen Reactions  . Nitrofurantoin     unknown  . Sulfamethoxazole-Trimethoprim     unknown  . Sulfonamide Derivatives     REACTION: rash    Current Outpatient Prescriptions  Medication Sig Dispense Refill  . Ascorbic Acid (VITAMIN C PO) Take 1 tablet by mouth daily.     Marland Kitchen aspirin 81 MG tablet Take 81 mg by mouth daily.      Marland Kitchen atorvastatin (LIPITOR) 20 MG tablet TAKE 1 TABLET DAILY 90 tablet 1  . calcium carbonate 200 MG capsule Take 200 mg by mouth daily.     . Cholecalciferol (VITAMIN D PO) Take 1 tablet by mouth daily.     . clopidogrel (PLAVIX) 75 MG tablet TAKE 1 TABLET DAILY 90 tablet 1  . Cyanocobalamin (VITAMIN B 12 PO) Take 1 tablet by mouth daily.     . fish oil-omega-3 fatty acids 1000 MG capsule Take 1 capsule by mouth daily.     . furosemide (LASIX) 20 MG tablet Take 1 tablet (20 mg total) by mouth every other day. 90 tablet 3  . gabapentin (NEURONTIN) 100 MG capsule TAKE ONE CAPSULE BY MOUTH TWICE DAILY 60 capsule 3  . glucose blood test strip Use once daily 100 each 3  . ipratropium-albuterol (DUONEB) 0.5-2.5 (3) MG/3ML SOLN Take 4 times a day and every 4 hours as needed for diff breathing 360 mL 5  . metoprolol tartrate (LOPRESSOR) 25 MG tablet TAKE ONE-HALF (1/2) TABLET TWICE A  DAY 90 tablet 1  . Multiple Vitamins-Minerals (MULTIVITAMIN WITH MINERALS) tablet Take 1 tablet by mouth daily.    Marland Kitchen nystatin (MYCOSTATIN) 100000 UNIT/ML suspension Take 5 mLs (500,000 Units total) by mouth 2 (two) times daily. 120 mL 0  . ONE TOUCH LANCETS MISC Use daily as directed 200 each 3  . pantoprazole (PROTONIX) 20 MG tablet TAKE 1 TABLET EVERY OTHER DAY 90 tablet 2  . PROAIR RESPICLICK 123XX123 (90 Base) MCG/ACT AEPB Inhale 2 puffs into the lungs every 4 (four) hours as needed. 1 each 3  . VITAMIN E PO Take 1 tablet by mouth daily.     Marland Kitchen zolpidem (AMBIEN) 10 MG tablet Take 1 tablet (10 mg total) by mouth at bedtime as needed for sleep. 90 tablet 1   No current facility-administered  medications for this visit.     REVIEW OF SYSTEMS:  [X]  denotes positive finding, [ ]  denotes negative finding Cardiac  Comments:  Chest pain or chest pressure:    Shortness of breath upon exertion:    Short of breath when lying flat:    Irregular heart rhythm:        Vascular    Pain in calf, thigh, or hip brought on by ambulation:    Pain in feet at night that wakes you up from your sleep:     Blood clot in your veins:    Leg swelling:           PHYSICAL EXAM: Vitals:   12/05/15 1342 12/05/15 1343  BP: (!) 157/69 (!) 156/64  Pulse: 68   Resp: 18   Temp: 97.6 F (36.4 C)   TempSrc: Oral   SpO2: 99%   Weight: 125 lb 14.4 oz (57.1 kg)   Height: 5\' 6"  (1.676 m)     GENERAL: The patient is a well-nourished female, in no acute distress. The vital signs are documented above. CARDIOVASCULAR: 2+ radial pulses bilaterally. Left carotid incision well-healed with no bruits bilaterally and no evidence of false aneurysm on the left PULMONARY: There is good air exchange  MUSCULOSKELETAL: There are no major deformities or cyanosis. NEUROLOGIC: No focal weakness or paresthesias are detected. SKIN: There are no ulcers or rashes noted. PSYCHIATRIC: The patient has a normal affect.  DATA:  Carotid duplex today was reviewed with the patient and her son present. She has a widely patent endarterectomy site with no evidence of stenosis. No evidence of stenosis in her right carotid artery.  MEDICAL ISSUES: Stable status post left carotid endarterectomy 2012. Will continue usual activities. We'll was reassured that her neck fullness would not be related to any recurrent carotid disease. She will present to the emergency department should she develop any neurologic deficits. Otherwise will see [place] in one year with repeat carotid duplex follow-up    Rosetta Posner, MD [facility] Tel [phone] Pager [phone]

## 2015-12-07 ENCOUNTER — Other Ambulatory Visit: Payer: Self-pay | Admitting: *Deleted

## 2015-12-07 DIAGNOSIS — I6523 Occlusion and stenosis of bilateral carotid arteries: Secondary | ICD-10-CM

## 2015-12-16 ENCOUNTER — Other Ambulatory Visit: Payer: Self-pay | Admitting: Family Medicine

## 2015-12-27 ENCOUNTER — Ambulatory Visit (INDEPENDENT_AMBULATORY_CARE_PROVIDER_SITE_OTHER): Payer: Medicare Other | Admitting: Pulmonary Disease

## 2015-12-27 ENCOUNTER — Encounter: Payer: Self-pay | Admitting: Pulmonary Disease

## 2015-12-27 VITALS — BP 124/68 | HR 83 | Ht 66.0 in | Wt 128.2 lb

## 2015-12-27 DIAGNOSIS — Z23 Encounter for immunization: Secondary | ICD-10-CM

## 2015-12-27 DIAGNOSIS — R05 Cough: Secondary | ICD-10-CM

## 2015-12-27 DIAGNOSIS — K219 Gastro-esophageal reflux disease without esophagitis: Secondary | ICD-10-CM | POA: Diagnosis not present

## 2015-12-27 DIAGNOSIS — J849 Interstitial pulmonary disease, unspecified: Secondary | ICD-10-CM

## 2015-12-27 DIAGNOSIS — R059 Cough, unspecified: Secondary | ICD-10-CM

## 2015-12-27 NOTE — Assessment & Plan Note (Addendum)
Patient with recent dyspnea since October 2016. Has been stable the last 6 months.  Chest ct scan (03/16/15) --  honeycombing at the bases, worse from 2010. Bronchiectasis in both upper lung zones which is worse from 2010 chest CT scan. Subpleural fibrosis. These were seen in July 2010 CT scan but not as prominent. CT scan also showed COPD changes. Patient is conservative. Wants to avoid extensive work up.    P> 1. Cont duoneb every 4 hrs prn for SOB. Usually she gets winded after doing heavy work such as cleaning the house and vacuuming. She had issues with her DME [facility]. She was not happy with [employer]. She has not received supplies recently. She wants to stay with them for now unless her issues worsen. We will order nebulizer supplies from [facility] today. Told patient to call if she is having issues with [employer] again. 2. Hold off on pulmicort. Caused thrush.  3. Pt received PNA 23 when she was 82 yo. I mentioned about Prevnar 13. She wanted to get it today. She received the flu shot already. 4. Need to be aggressive as far as reflux disease medicine. Told her to touch base with her primary care doctor whether she can take her medicine half tablet every day rather than every other day.

## 2015-12-27 NOTE — Addendum Note (Signed)
Addended by: Benson Setting L on: 12/27/2015 04:38 PM   Modules accepted: Orders

## 2015-12-27 NOTE — Progress Notes (Signed)
Subjective:    Patient ID: [name], female    DOB: Aug 12, 1920, 82 y.o.   MRN: [id number]  HPI    This is the case of [name], 82 y.o. Female, who was referred by Dr.Burchette in consultation regarding dyspnea.   As you very well know, patient is a non smoker.  She did not necessarily  have lung issues until October 2016. She had dyspnea, generalized weakness, poor appetite, cough. She felt unwell. He was seen by her primary care doctor. Workup was negative. Chest x-ray did not reveal any acute process. Symptoms persisted. During Thanksgiving, she felt worse as far as her dyspnea is concerned. Patient ended up getting 2 rounds of antibiotics. She was also given albuterol puffer. There was concern about her technique was she was switched to albuterol Respimat. Overall, she is better compared to October but she still gets winded with exertion.  Patient had a chest CT scan.  Chest ct scan (03/16/15) --  honeycombing at the bases, worse from 2010. Bronchiectasis in both upper lung zones which is worse from 2010 chest CT scan. Subpleural fibrosis. These were seen in July 2010 CT scan but not as prominent. CT scan also showed COPD changes   ROV (06/06/15) Pt is here for f/u on her fibrosis and bronchiectasis. She stopped Pulmicort secondary to thrush. He feels better. Has not been admitted nor been on antibiotics since last seen.  ROV 12/27/15 Patient returns to the office as follow-up on her interstitial lung disease. Since last seen, she states she has been doing relatively fair. Has not been admitted nor been on antibiotics since last seen. Occasional cough. Occasional exertional dyspnea, usually when she cleans the house. She uses DuoNeb when necessary. Has reflux disease for which she takes medicine every other day. Occasional acting up of reflux disease.   Review of Systems  Constitutional: Negative.  Negative for fever and unexpected weight change.  HENT: Negative.  Negative for  congestion, dental problem, ear pain, nosebleeds, postnasal drip, rhinorrhea, sinus pressure, sneezing, sore throat and trouble swallowing.   Eyes: Negative.  Negative for redness and itching.  Respiratory: Positive for cough and shortness of breath. Negative for chest tightness and wheezing.   Cardiovascular: Negative.  Negative for palpitations and leg swelling.  Gastrointestinal: Negative.  Negative for nausea and vomiting.  Endocrine: Negative.   Genitourinary: Negative.  Negative for dysuria.  Musculoskeletal: Negative.  Negative for joint swelling.  Skin: Negative.  Negative for rash.  Allergic/Immunologic: Negative.   Neurological: Negative.  Negative for headaches.  Hematological: Negative.  Does not bruise/bleed easily.  Psychiatric/Behavioral: Negative.  Negative for dysphoric mood. The patient is not nervous/anxious.   All other systems reviewed and are negative.      Objective:   Physical Exam Vitals:  Vitals:   12/27/15 1505  BP: 124/68  Pulse: 83  SpO2: 95%  Weight: 128 lb 3.2 oz (58.2 kg)  Height: 5\' 6"  (1.676 m)    Constitutional/General:  Pleasant, well-nourished, well-developed, not in any distress,  Comfortably seating.  Well kempt  Body mass index is 20.69 kg/m. Wt Readings from Last 3 Encounters:  12/27/15 128 lb 3.2 oz (58.2 kg)  12/05/15 125 lb 14.4 oz (57.1 kg)  11/16/15 123 lb (55.8 kg)      HEENT: Pupils equal and reactive to light and accommodation. Anicteric sclerae. Normal nasal mucosa.   No oral  lesions,  mouth clear,  oropharynx clear, no postnasal drip. (-) Oral thrush. No dental caries.  Airway - Mallampati class III  Neck: No masses. Midline trachea. No JVD, (-) LAD. (-) bruits appreciated.  Respiratory/Chest: Grossly normal chest. (-) deformity. (-) Accessory muscle use.  Symmetric expansion. (-) Tenderness on palpation.  Resonant on percussion.  Diminished BS on both lower lung zones. (-) wheezing, crackles, rhonchi (-)  egophony  Cardiovascular: Regular rate and  rhythm, heart sounds normal, no murmur or gallops, no peripheral edema  Gastrointestinal:  Normal bowel sounds. Soft, non-tender. No hepatosplenomegaly.  (-) masses.   Musculoskeletal:  Normal muscle tone. Normal gait.   Extremities: Grossly normal. (-) clubbing, cyanosis.  (-) edema  Skin: (-) rash,lesions seen.   Neurological/Psychiatric : alert, oriented to time, place, person. Normal mood and affect             Assessment & Plan:  ILD (interstitial lung disease) ([place]) Patient with recent dyspnea since October 2016. Has been stable the last 6 months.  Chest ct scan (03/16/15) --  honeycombing at the bases, worse from 2010. Bronchiectasis in both upper lung zones which is worse from 2010 chest CT scan. Subpleural fibrosis. These were seen in July 2010 CT scan but not as prominent. CT scan also showed COPD changes. Patient is conservative. Wants to avoid extensive work up.    P> 1. Cont duoneb every 4 hrs prn for SOB. Usually she gets winded after doing heavy work such as cleaning the house and vacuuming. She had issues with her DME [employer]. She was not happy with [employer]. She has not received supplies recently. She wants to stay with them for now unless her issues worsen. We will order nebulizer supplies from [facility] today. Told patient to call if she is having issues with [employer] again. 2. Hold off on pulmicort. Caused thrush.  3. Pt received PNA 23 when she was 82 yo. I mentioned about Prevnar 13. She wanted to get it today. She received the flu shot already. 4. Need to be aggressive as far as reflux disease medicine. Told her to touch base with her primary care doctor whether she can take her medicine half tablet every day rather than every other day.   Cough Better cough. Hold off on Flonase.    GERD Occasional GERD sx. On meds 1/2 tab EOD.  Told him to touch base with his primary care doctor regarding taking meds  daily.   Return to clinic in 6 mos       J. Shirl Harris, MD Pulmonary and [place] Pager: [phone] Office: [phone], Fax: [phone]

## 2015-12-27 NOTE — Assessment & Plan Note (Signed)
Occasional GERD sx. On meds 1/2 tab EOD.  Told him to touch base with his primary care doctor regarding taking meds daily.

## 2015-12-27 NOTE — Patient Instructions (Signed)
It was a pleasure taking care of you today!  You are diagnosed with Interstitial Lung Disease (ILD).  This usually causes swelling and/or scarring of your lungs causing you to be short of breath, have cough, or have low oxygen level.   Please make sure you take your medications as needed.   Your medications include the following: Duoneb every 4 hrs as needed.   Please call the office if your symptoms worsen. You may have a flare up of ILD and this may require steroids and antibiotics.   Please make sure your vaccines are up to date. .   Return to clinic in 6 months.

## 2015-12-27 NOTE — Assessment & Plan Note (Signed)
Better cough. Hold off on Flonase.

## 2015-12-28 ENCOUNTER — Other Ambulatory Visit: Payer: Self-pay

## 2015-12-28 DIAGNOSIS — J849 Interstitial pulmonary disease, unspecified: Secondary | ICD-10-CM

## 2015-12-28 DIAGNOSIS — R0602 Shortness of breath: Secondary | ICD-10-CM

## 2015-12-30 ENCOUNTER — Other Ambulatory Visit: Payer: Self-pay | Admitting: Family Medicine

## 2016-01-02 ENCOUNTER — Telehealth: Payer: Self-pay | Admitting: Pulmonary Disease

## 2016-01-02 NOTE — Addendum Note (Signed)
Addended by: Lianne Cure A on: 01/02/2016 01:48 PM   Modules accepted: Orders

## 2016-01-03 ENCOUNTER — Telehealth: Payer: Self-pay

## 2016-01-03 NOTE — Telephone Encounter (Signed)
AD  [employer] called to informed us that this pt. Is not eligible for a refill of her neb. Supplies because she received supplies 10/19/15 and insurance will only cover for every 6 months. Will follow up with pt. And inform her as well.

## 2016-01-03 NOTE — Telephone Encounter (Signed)
LMOM TCB x1  To inform pt. That the reason she would not be able to receive a refill of her neb supplies was because she received some back on 10/19/15. And insurance will only allow every 6 months.   A message will be fowarded to AD

## 2016-01-08 NOTE — Telephone Encounter (Signed)
lmtcb x2 for pt. 

## 2016-01-09 NOTE — Telephone Encounter (Signed)
lmtcb x3 for pt. Per triage protocol, message will be closed.  

## 2016-01-23 ENCOUNTER — Other Ambulatory Visit: Payer: Self-pay | Admitting: Emergency Medicine

## 2016-01-23 ENCOUNTER — Telehealth: Payer: Self-pay | Admitting: Family Medicine

## 2016-01-23 MED ORDER — ZOLPIDEM TARTRATE 10 MG PO TABS: 10.0000 mg | ORAL_TABLET | Freq: Every evening | ORAL | 1 refills | Status: DC | PRN

## 2016-01-23 NOTE — Telephone Encounter (Signed)
Verbally called in rx for patient

## 2016-01-23 NOTE — Telephone Encounter (Signed)
Pt needs refill on zolpidem 10 mg #90 with one refill send to [facility]

## 2016-02-19 ENCOUNTER — Ambulatory Visit: Payer: Medicare Other | Admitting: Family Medicine

## 2016-02-20 NOTE — Telephone Encounter (Signed)
[name] please close out this telephone note for me

## 2016-03-02 ENCOUNTER — Other Ambulatory Visit: Payer: Self-pay | Admitting: Cardiology

## 2016-03-05 ENCOUNTER — Telehealth: Payer: Self-pay | Admitting: Family Medicine

## 2016-03-05 MED ORDER — ZOLPIDEM TARTRATE 10 MG PO TABS: 10.0000 mg | ORAL_TABLET | Freq: Every evening | ORAL | 1 refills | Status: DC | PRN

## 2016-03-05 NOTE — Telephone Encounter (Signed)
Medication sent in for patient. 

## 2016-03-05 NOTE — Telephone Encounter (Signed)
Pt states [facility] never received the Rx zolpidem (AMBIEN) 10 MG tablet  Sent back 01/23/16.  Pt states she has been trying to fill 2 wks. Last bottle she has says 11/2015.  Can you resend?

## 2016-04-05 ENCOUNTER — Ambulatory Visit (INDEPENDENT_AMBULATORY_CARE_PROVIDER_SITE_OTHER): Payer: Medicare Other | Admitting: Family Medicine

## 2016-04-05 VITALS — BP 160/60 | HR 82 | Ht 66.0 in | Wt 128.0 lb

## 2016-04-05 DIAGNOSIS — R0781 Pleurodynia: Secondary | ICD-10-CM

## 2016-04-05 MED ORDER — HYDROCODONE-ACETAMINOPHEN 5-325 MG PO TABS: 1.0000 | ORAL_TABLET | Freq: Four times a day (QID) | ORAL | 0 refills | Status: DC | PRN

## 2016-04-05 NOTE — Progress Notes (Signed)
Subjective:     Patient ID: [name], female   DOB: 1920-04-05, 83 y.o.   MRN: [id number]  HPI Patient seen following fall. Last Tuesday she was going to play bridge and was entering a foyer and tripped over a floor rug. She landed but is not sure exactly how she landed. She did not notice any pain right away. No loss of consciousness. Denied any upper extremity pain or any hip pain. By later that day and especially the next day she had some bilateral lower rib cage pain. Her pain is worse with movement and also deep breathing. She's not any visible bruising or swelling. She's using Tylenol with some relief No cough and no fever.  Past Medical History:  Diagnosis Date  . ANEMIA, NORMOCYTIC 11/30/2009  . Atrial flutter ([place]) 04/12/2009  . CAD, NATIVE VESSEL 04/27/2008   Cardiac catheterization 6/08: EF 55%, 1+ MR, Dx 50%, LAD 50%, dLAD 40%, OM1 50-60% (up to 70%), pOM 30%, pRCA 40%, mRCA 50-60%, pPDA 50-60% (up to 70%).  Medical therapy was recommended  . Carotid stenosis    dopplers [id number]: LICA [id number]  . COLONIC POLYPS, ADENOMATOUS 12/26/2009  . DIABETES MELLITUS, TYPE II 08/11/2006  . DIVERTICULOSIS, COLON 08/11/2006  . GERD 02/16/2007  . Hemorrhoids   . HEMORRHOIDS-INTERNAL 12/26/2009  . HYPERLIPIDEMIA 09/09/2006  . HYPERTENSION 09/09/2006  . LEFT BUNDLE BRANCH BLOCK 08/11/2006  . OSTEOARTHRITIS 08/11/2006  . OSTEOPOROSIS 08/11/2006  . Stroke ([place]) 12-06-13  . TIA 09/02/2006   Echocardiogram 6/12: Mild to moderate MR, trace AI, trace TR, PASP 34, bubble study negative for intracardiac shunting, normal EF (greater than 55%)   Past Surgical History:  Procedure Laterality Date  . CAROTID ENDARTERECTOMY Left 12-05-2010  . CLOSED REDUCTION METATARSAL FRACTURE     right 5th  . COLONOSCOPY W/ POLYPECTOMY  2001  . EYE SURGERY     catarac  . LOOP RECORDER IMPLANT N/A 01/03/2014   Procedure: LOOP RECORDER IMPLANT;  Surgeon: Evans Lance, MD;  Location: [facility] CATH LAB;  Service: Cardiovascular;   Laterality: N/A;  . TONSILLECTOMY      reports that she has never smoked. She has never used smokeless tobacco. She reports that she does not drink alcohol or use drugs. family history includes Breast cancer in her sister; Colon cancer in her brother; Congestive Heart Failure (age of onset: 26) in her father; Heart attack in her mother; Heart disease in her brother; Heart disease (age of onset: 77) in her mother; Kidney disease in her mother; Uterine cancer in her sister. Allergies  Allergen Reactions  . Nitrofurantoin     unknown  . Sulfamethoxazole-Trimethoprim     unknown  . Sulfonamide Derivatives     REACTION: rash     Review of Systems  Constitutional: Negative for chills and fever.  Respiratory: Negative for shortness of breath.   Cardiovascular: Negative for chest pain, palpitations and leg swelling.       Objective:   Physical Exam  Constitutional: She appears well-developed and well-nourished.  Cardiovascular: Normal rate and regular rhythm.   Pulmonary/Chest: Effort normal and breath sounds normal. No respiratory distress. She has no wheezes. She has no rales.  Has tenderness over the right and left lower anterior rib cage. No visible ecchymosis or swelling.  Abdominal: Soft. She exhibits no mass. There is no tenderness. There is no rebound and no guarding.       Assessment:     Anterior rib cage pain bilaterally following fall  Plan:     -We discussed the fact with her and her son that she could have anterior rib fractures but would not change management. She has clear lungs to auscultation. Continue regular Tylenol and if not relieved with that we wrote for limited hydrocodone 5 mg 1-2 every 6 hours for severe pain -Follow-up immediately for any fever, increasing cough, shortness of breath  Eulas Post MD [facility]

## 2016-04-05 NOTE — Progress Notes (Signed)
Pre visit review using our clinic review tool, if applicable. No additional management support is needed unless otherwise documented below in the visit note. 

## 2016-04-05 NOTE — Patient Instructions (Signed)
Start with the Tylenol two every 6 hours and may go to Vicodin if not relieved with that.

## 2016-05-16 LAB — HM DIABETES EYE EXAM

## 2016-05-17 ENCOUNTER — Encounter: Payer: Self-pay | Admitting: Family Medicine

## 2016-05-22 ENCOUNTER — Encounter: Payer: Self-pay | Admitting: Family Medicine

## 2016-05-22 ENCOUNTER — Ambulatory Visit (INDEPENDENT_AMBULATORY_CARE_PROVIDER_SITE_OTHER): Payer: Medicare Other | Admitting: Family Medicine

## 2016-05-22 VITALS — BP 110/70 | HR 77 | Temp 97.7°F | Wt 125.5 lb

## 2016-05-22 DIAGNOSIS — R7989 Other specified abnormal findings of blood chemistry: Secondary | ICD-10-CM

## 2016-05-22 DIAGNOSIS — R946 Abnormal results of thyroid function studies: Secondary | ICD-10-CM | POA: Diagnosis not present

## 2016-05-22 DIAGNOSIS — I10 Essential (primary) hypertension: Secondary | ICD-10-CM | POA: Diagnosis not present

## 2016-05-22 DIAGNOSIS — E118 Type 2 diabetes mellitus with unspecified complications: Secondary | ICD-10-CM | POA: Diagnosis not present

## 2016-05-22 DIAGNOSIS — R531 Weakness: Secondary | ICD-10-CM

## 2016-05-22 LAB — CBC WITH DIFFERENTIAL/PLATELET
BASOS PCT: 0.4 % (ref 0.0–3.0)
Basophils Absolute: 0 10*3/uL (ref 0.0–0.1)
EOS ABS: 0.2 10*3/uL (ref 0.0–0.7)
Eosinophils Relative: 3 % (ref 0.0–5.0)
HCT: 35.7 % — ABNORMAL LOW (ref 36.0–46.0)
HEMOGLOBIN: 11.9 g/dL — AB (ref 12.0–15.0)
LYMPHS ABS: 2.5 10*3/uL (ref 0.7–4.0)
Lymphocytes Relative: 39.8 % (ref 12.0–46.0)
MCHC: 33.5 g/dL (ref 30.0–36.0)
MCV: 96.9 fl (ref 78.0–100.0)
MONO ABS: 0.6 10*3/uL (ref 0.1–1.0)
Monocytes Relative: 9.5 % (ref 3.0–12.0)
NEUTROS ABS: 3 10*3/uL (ref 1.4–7.7)
NEUTROS PCT: 47.3 % (ref 43.0–77.0)
PLATELETS: 184 10*3/uL (ref 150.0–400.0)
RBC: 3.68 Mil/uL — ABNORMAL LOW (ref 3.87–5.11)
RDW: 13.5 % (ref 11.5–15.5)
WBC: 6.4 10*3/uL (ref 4.0–10.5)

## 2016-05-22 LAB — BASIC METABOLIC PANEL
BUN: 29 mg/dL — ABNORMAL HIGH (ref 6–23)
CHLORIDE: 103 meq/L (ref 96–112)
CO2: 27 mEq/L (ref 19–32)
CREATININE: 1.16 mg/dL (ref 0.40–1.20)
Calcium: 9.4 mg/dL (ref 8.4–10.5)
GFR: 46.24 mL/min — AB (ref >60.00)
Glucose, Bld: 134 mg/dL — ABNORMAL HIGH (ref 70–99)
POTASSIUM: 4 meq/L (ref 3.5–5.1)
Sodium: 139 mEq/L (ref 135–145)

## 2016-05-22 LAB — LIPID PANEL
CHOLESTEROL: 126 mg/dL (ref 0–200)
HDL: 47.4 mg/dL (ref >39.00)
LDL CALC: 53 mg/dL (ref 0–99)
NonHDL: 78.26
Total CHOL/HDL Ratio: 3
Triglycerides: 128 mg/dL (ref 0.0–149.0)
VLDL: 25.6 mg/dL (ref 0.0–40.0)

## 2016-05-22 LAB — HEPATIC FUNCTION PANEL
ALT: 20 U/L (ref 0–35)
AST: 28 U/L (ref 0–37)
Albumin: 4.1 g/dL (ref 3.5–5.2)
Alkaline Phosphatase: 37 U/L — ABNORMAL LOW (ref 39–117)
BILIRUBIN TOTAL: 0.7 mg/dL (ref 0.2–1.2)
Bilirubin, Direct: 0.2 mg/dL (ref 0.0–0.3)
Total Protein: 7.5 g/dL (ref 6.0–8.3)

## 2016-05-22 LAB — HEMOGLOBIN A1C: HEMOGLOBIN A1C: 7.1 % — AB (ref 4.6–6.5)

## 2016-05-22 LAB — TSH: TSH: 3.46 u[IU]/mL (ref 0.35–4.50)

## 2016-05-22 NOTE — Progress Notes (Signed)
Pre visit review using our clinic review tool, if applicable. No additional management support is needed unless otherwise documented below in the visit note. 

## 2016-05-22 NOTE — Progress Notes (Signed)
Subjective:     Patient ID: [name], female   DOB: September 29, 1920, 83 y.o.   MRN: [id number]  HPI Patient has chronic problems including history of TIA, peripheral vascular disease, hypertension, CAD, interstitial lung disease, GERD, type 2 diabetes, osteoporosis. She lives alone but has very supportive children- although they do not live nearby.  She comes in today with complaints of generalized weakness and lack of energy. She's had similar complaints in the past. She has noticed some diminished appetite and weight is down about 2 pounds from last visit. Current medications reviewed and compliant with all. No recent chest pains. Denies any headaches, abdominal pain, dyspnea, or orthopnea. She has some chronic intermittent constipation. Generally sleeps about 6-8 hours at night. Denies depression symptoms.  No recent falls since last visit. She has chronic insomnia and takes low-dose Ambien 5 mg daily at bedtime. He's had some mild pain left lower rib cage area but no pleuritic pain. No cough.  Past Medical History:  Diagnosis Date  . ANEMIA, NORMOCYTIC 11/30/2009  . Atrial flutter ([place]) 04/12/2009  . CAD, NATIVE VESSEL 04/27/2008   Cardiac catheterization 6/08: EF 55%, 1+ MR, Dx 50%, LAD 50%, dLAD 40%, OM1 50-60% (up to 70%), pOM 30%, pRCA 40%, mRCA 50-60%, pPDA 50-60% (up to 70%).  Medical therapy was recommended  . Carotid stenosis    dopplers 0/35: LICA 46-56%  . COLONIC POLYPS, ADENOMATOUS 12/26/2009  . DIABETES MELLITUS, TYPE II 08/11/2006  . DIVERTICULOSIS, COLON 08/11/2006  . GERD 02/16/2007  . Hemorrhoids   . HEMORRHOIDS-INTERNAL 12/26/2009  . HYPERLIPIDEMIA 09/09/2006  . HYPERTENSION 09/09/2006  . LEFT BUNDLE BRANCH BLOCK 08/11/2006  . OSTEOARTHRITIS 08/11/2006  . OSTEOPOROSIS 08/11/2006  . Stroke ([place]) 12-06-13  . TIA 09/02/2006   Echocardiogram 6/12: Mild to moderate MR, trace AI, trace TR, PASP 34, bubble study negative for intracardiac shunting, normal EF (greater than 55%)    Past Surgical History:  Procedure Laterality Date  . CAROTID ENDARTERECTOMY Left 12-05-2010  . CLOSED REDUCTION METATARSAL FRACTURE     right 5th  . COLONOSCOPY W/ POLYPECTOMY  2001  . EYE SURGERY     catarac  . LOOP RECORDER IMPLANT N/A 01/03/2014   Procedure: LOOP RECORDER IMPLANT;  Surgeon: Evans Lance, MD;  Location: [facility] C[facility];  Service: Cardiovascular;  Laterality: N/A;  . TONSILLECTOMY      reports that she has never smoked. She has never used smokeless tobacco. She reports that she does not drink alcohol or use drugs. family history includes Breast cancer in her sister; Colon cancer in her brother; Congestive Heart Failure (age of onset: 79) in her father; Heart attack in her mother; Heart disease in her brother; Heart disease (age of onset: 67) in her mother; Kidney disease in her mother; Uterine cancer in her sister. Allergies  Allergen Reactions  . Nitrofurantoin     unknown  . Sulfamethoxazole-Trimethoprim     unknown  . Sulfonamide Derivatives     REACTION: rash     Review of Systems  Constitutional: Positive for appetite change and fatigue. Negative for chills and fever.  HENT: Negative for trouble swallowing.   Eyes: Negative for visual disturbance.  Respiratory: Negative for shortness of breath.   Cardiovascular: Negative for chest pain.  Gastrointestinal: Positive for constipation. Negative for abdominal pain, nausea and vomiting.  Endocrine: Negative for polydipsia and polyuria.  Genitourinary: Negative for dysuria.  Neurological: Positive for weakness. Negative for dizziness, seizures, syncope and headaches.  Objective:   Physical Exam  Constitutional: She is oriented to person, place, and time. She appears well-developed and well-nourished.  HENT:  Mouth/Throat: Oropharynx is clear and moist.  Neck: Neck supple. No thyromegaly present.  Cardiovascular: Normal rate.   Pulmonary/Chest: Effort normal and breath sounds normal. No respiratory  distress. She has no wheezes. She has no rales.  Abdominal: Soft. She exhibits no mass. There is no tenderness. There is no rebound and no guarding.  Musculoskeletal: She exhibits no edema.  Neurological: She is alert and oriented to person, place, and time. No cranial nerve deficit.       Assessment:     #1 nonspecific symptoms of fatigue and generalized weakness. She's had gradual decline with some gradual decrease in body weight over many months  #2 hypertension stable and at goal  #3 history of type 2 diabetes currently not treated with medication  #4 history of dyslipidemia  #5 chronic insomnia  #6 mildly elevated TSH of 5.7 by labs last February 2017    Plan:     -Obtain follow-up labs including CBC, comp present medical panel, hemoglobin A1c, TSH -She is encouraged to increase her hydration. May be not drinking enough fluids -Close follow-up within 3 weeks. -May consider discontinuing Ambien and consider low-dose mirtazapine 15 mg daily at bedtime to address both her insomnia and loss of appetite -We have suggested nutritional supplements such as Boost but she states she has these already at home and has not had any appetite to consume these.  Eulas Post MD [facility]

## 2016-05-31 ENCOUNTER — Other Ambulatory Visit: Payer: Self-pay | Admitting: Cardiology

## 2016-06-03 ENCOUNTER — Emergency Department (HOSPITAL_COMMUNITY): Payer: Medicare Other

## 2016-06-03 ENCOUNTER — Inpatient Hospital Stay (HOSPITAL_COMMUNITY)
Admission: EM | Admit: 2016-06-03 | Discharge: 2016-06-04 | DRG: 086 | Disposition: A | Discharge status: Home Health | Payer: Medicare Other | Attending: Internal Medicine | Admitting: Internal Medicine

## 2016-06-03 DIAGNOSIS — E119 Type 2 diabetes mellitus without complications: Secondary | ICD-10-CM | POA: Diagnosis not present

## 2016-06-03 DIAGNOSIS — Z7982 Long term (current) use of aspirin: Secondary | ICD-10-CM | POA: Diagnosis not present

## 2016-06-03 DIAGNOSIS — S066X1A Traumatic subarachnoid hemorrhage with loss of consciousness of 30 minutes or less, initial encounter: Principal | ICD-10-CM | POA: Diagnosis present

## 2016-06-03 DIAGNOSIS — I2510 Atherosclerotic heart disease of native coronary artery without angina pectoris: Secondary | ICD-10-CM | POA: Diagnosis present

## 2016-06-03 DIAGNOSIS — M1990 Unspecified osteoarthritis, unspecified site: Secondary | ICD-10-CM | POA: Diagnosis present

## 2016-06-03 DIAGNOSIS — E785 Hyperlipidemia, unspecified: Secondary | ICD-10-CM | POA: Diagnosis not present

## 2016-06-03 DIAGNOSIS — I10 Essential (primary) hypertension: Secondary | ICD-10-CM | POA: Diagnosis present

## 2016-06-03 DIAGNOSIS — K219 Gastro-esophageal reflux disease without esophagitis: Secondary | ICD-10-CM | POA: Diagnosis present

## 2016-06-03 DIAGNOSIS — Z8673 Personal history of transient ischemic attack (TIA), and cerebral infarction without residual deficits: Secondary | ICD-10-CM

## 2016-06-03 DIAGNOSIS — W19XXXA Unspecified fall, initial encounter: Secondary | ICD-10-CM | POA: Diagnosis not present

## 2016-06-03 DIAGNOSIS — I609 Nontraumatic subarachnoid hemorrhage, unspecified: Secondary | ICD-10-CM

## 2016-06-03 DIAGNOSIS — I480 Paroxysmal atrial fibrillation: Secondary | ICD-10-CM | POA: Diagnosis present

## 2016-06-03 DIAGNOSIS — E118 Type 2 diabetes mellitus with unspecified complications: Secondary | ICD-10-CM | POA: Diagnosis not present

## 2016-06-03 DIAGNOSIS — Z79899 Other long term (current) drug therapy: Secondary | ICD-10-CM

## 2016-06-03 DIAGNOSIS — R55 Syncope and collapse: Secondary | ICD-10-CM | POA: Diagnosis not present

## 2016-06-03 DIAGNOSIS — Z7902 Long term (current) use of antithrombotics/antiplatelets: Secondary | ICD-10-CM

## 2016-06-03 DIAGNOSIS — M810 Age-related osteoporosis without current pathological fracture: Secondary | ICD-10-CM | POA: Diagnosis present

## 2016-06-03 DIAGNOSIS — I4892 Unspecified atrial flutter: Secondary | ICD-10-CM | POA: Diagnosis not present

## 2016-06-03 LAB — RAPID URINE DRUG SCREEN, HOSP PERFORMED
Amphetamines: NOT DETECTED
BARBITURATES: NOT DETECTED
Benzodiazepines: NOT DETECTED
COCAINE: NOT DETECTED
Opiates: NOT DETECTED
TETRAHYDROCANNABINOL: NOT DETECTED

## 2016-06-03 LAB — CBC WITH DIFFERENTIAL/PLATELET
Basophils Absolute: 0 10*3/uL (ref 0.0–0.1)
Basophils Relative: 0 %
EOS PCT: 2 %
Eosinophils Absolute: 0.2 10*3/uL (ref 0.0–0.7)
HCT: 35.1 % — ABNORMAL LOW (ref 36.0–46.0)
HEMOGLOBIN: 11.3 g/dL — AB (ref 12.0–15.0)
LYMPHS ABS: 2.2 10*3/uL (ref 0.7–4.0)
LYMPHS PCT: 24 %
MCH: 31.2 pg (ref 26.0–34.0)
MCHC: 32.2 g/dL (ref 30.0–36.0)
MCV: 97 fL (ref 78.0–100.0)
Monocytes Absolute: 0.7 10*3/uL (ref 0.1–1.0)
Monocytes Relative: 7 %
NEUTROS ABS: 6 10*3/uL (ref 1.7–7.7)
NEUTROS PCT: 67 %
PLATELETS: 167 10*3/uL (ref 150–400)
RBC: 3.62 MIL/uL — AB (ref 3.87–5.11)
RDW: 13.3 % (ref 11.5–15.5)
WBC: 9.1 10*3/uL (ref 4.0–10.5)

## 2016-06-03 LAB — COMPREHENSIVE METABOLIC PANEL
ALK PHOS: 38 U/L (ref 38–126)
ALT: 26 U/L (ref 14–54)
ANION GAP: 11 (ref 5–15)
AST: 36 U/L (ref 15–41)
Albumin: 3.7 g/dL (ref 3.5–5.0)
BUN: 23 mg/dL — AB (ref 6–20)
CALCIUM: 9.1 mg/dL (ref 8.9–10.3)
CO2: 25 mmol/L (ref 22–32)
CREATININE: 1.08 mg/dL — AB (ref 0.44–1.00)
Chloride: 102 mmol/L (ref 101–111)
GFR calc Af Amer: 49 mL/min — ABNORMAL LOW (ref >60)
GFR calc non Af Amer: 43 mL/min — ABNORMAL LOW (ref >60)
Glucose, Bld: 170 mg/dL — ABNORMAL HIGH (ref 65–99)
Potassium: 4 mmol/L (ref 3.5–5.1)
Sodium: 138 mmol/L (ref 135–145)
TOTAL PROTEIN: 7.1 g/dL (ref 6.5–8.1)
Total Bilirubin: 0.3 mg/dL (ref 0.3–1.2)

## 2016-06-03 LAB — URINALYSIS, ROUTINE W REFLEX MICROSCOPIC
Bilirubin Urine: NEGATIVE
GLUCOSE, UA: NEGATIVE mg/dL
Hgb urine dipstick: NEGATIVE
Ketones, ur: NEGATIVE mg/dL
Nitrite: NEGATIVE
PH: 5 (ref 5.0–8.0)
Protein, ur: NEGATIVE mg/dL
SPECIFIC GRAVITY, URINE: 1.006 (ref 1.005–1.030)

## 2016-06-03 LAB — GLUCOSE, CAPILLARY: GLUCOSE-CAPILLARY: 128 mg/dL — AB (ref 65–99)

## 2016-06-03 LAB — I-STAT TROPONIN, ED: Troponin i, poc: 0.01 ng/mL (ref 0.00–0.08)

## 2016-06-03 LAB — ETHANOL: Alcohol, Ethyl (B): <5 mg/dL (ref <5)

## 2016-06-03 LAB — CBG MONITORING, ED: Glucose-Capillary: 183 mg/dL — ABNORMAL HIGH (ref 65–99)

## 2016-06-03 LAB — LIPASE, BLOOD: LIPASE: 55 U/L — AB (ref 11–51)

## 2016-06-03 LAB — MAGNESIUM: MAGNESIUM: 1.8 mg/dL (ref 1.7–2.4)

## 2016-06-03 MED ORDER — SODIUM CHLORIDE 0.9 % IV SOLN
INTRAVENOUS | Status: DC
  Administered 2016-06-03 – 2016-06-04 (×2): via INTRAVENOUS

## 2016-06-03 MED ORDER — LABETALOL HCL 5 MG/ML IV SOLN: 5.0000 mg | INTRAVENOUS | Status: DC | PRN

## 2016-06-03 MED ORDER — STROKE: EARLY STAGES OF RECOVERY BOOK
Freq: Once | Status: AC
  Administered 2016-06-03: 06:00:00
  Filled 2016-06-03: qty 1

## 2016-06-03 MED ORDER — IPRATROPIUM-ALBUTEROL 0.5-2.5 (3) MG/3ML IN SOLN: 3.0000 mL | RESPIRATORY_TRACT | Status: DC | PRN

## 2016-06-03 MED ORDER — ALBUTEROL SULFATE (2.5 MG/3ML) 0.083% IN NEBU: 2.5000 mg | INHALATION_SOLUTION | RESPIRATORY_TRACT | Status: DC | PRN

## 2016-06-03 MED ORDER — ACETAMINOPHEN 160 MG/5ML PO SOLN: 650.0000 mg | ORAL | Status: DC | PRN

## 2016-06-03 MED ORDER — ONDANSETRON HCL 4 MG/2ML IJ SOLN: 4.0000 mg | Freq: Four times a day (QID) | INTRAMUSCULAR | Status: DC | PRN

## 2016-06-03 MED ORDER — METOPROLOL TARTRATE 12.5 MG HALF TABLET
12.5000 mg | ORAL_TABLET | Freq: Two times a day (BID) | ORAL | Status: DC
  Administered 2016-06-03 – 2016-06-04 (×3): 12.5 mg via ORAL
  Filled 2016-06-03 (×3): qty 1

## 2016-06-03 MED ORDER — PANTOPRAZOLE SODIUM 20 MG PO TBEC
20.0000 mg | DELAYED_RELEASE_TABLET | ORAL | Status: DC
  Filled 2016-06-03 (×2): qty 1

## 2016-06-03 MED ORDER — FUROSEMIDE 20 MG PO TABS
20.0000 mg | ORAL_TABLET | ORAL | Status: DC
  Administered 2016-06-04: 20 mg via ORAL
  Filled 2016-06-03: qty 1

## 2016-06-03 MED ORDER — ONDANSETRON 4 MG PO TBDP: 4.0000 mg | ORAL_TABLET | Freq: Four times a day (QID) | ORAL | Status: DC | PRN

## 2016-06-03 MED ORDER — ATORVASTATIN CALCIUM 10 MG PO TABS
20.0000 mg | ORAL_TABLET | Freq: Every day | ORAL | Status: DC
  Administered 2016-06-03: 20 mg via ORAL
  Filled 2016-06-03 (×2): qty 2

## 2016-06-03 MED ORDER — ACETAMINOPHEN 325 MG PO TABS: 650.0000 mg | ORAL_TABLET | ORAL | Status: DC | PRN

## 2016-06-03 MED ORDER — DOCUSATE SODIUM 100 MG PO CAPS
100.0000 mg | ORAL_CAPSULE | Freq: Two times a day (BID) | ORAL | Status: DC
  Administered 2016-06-03 – 2016-06-04 (×3): 100 mg via ORAL
  Filled 2016-06-03 (×3): qty 1

## 2016-06-03 MED ORDER — ACETAMINOPHEN 650 MG RE SUPP: 650.0000 mg | RECTAL | Status: DC | PRN

## 2016-06-03 NOTE — Evaluation (Signed)
Occupational Therapy Evaluation Patient Details Name: [name] MRN: [id number] DOB: 1920-06-21 Today's Date: 06/03/2016    History of Present Illness Pt is a 83 y.o. female who presented to the ED via EMS following syncopal episode. She reports making her bed and next thing she knew she "woke up on floor." She does not remember her fall and thinks she only had LOC for a few seconds. LIves alone and ambulates independently at [place]. PMH significant for: CAD, DM2, HTN, TIA, a.flutter.    Clinical Impression   PTA, pt was independent with basic ADL and functional mobility. Pt currently requires overall min guard assist for standing ADL due to decreased balance. She additionally demonstrates decreased problem solving, short-term memory, and awareness of deficits impacting safety with home management skills. Pt lives alone and is active in the community. Feel she would benefit from home health OT services post-acute D/C in order to maximize independence and safety with ADL and IADL at home. Will continue to follow while admitted.     Follow Up Recommendations  Home health OT;Supervision/Assistance - 24 hour    Equipment Recommendations  None recommended by OT    Recommendations for Other Services       Precautions / Restrictions Precautions Precautions: Fall Precaution Comments: Unsteady Restrictions Weight Bearing Restrictions: No      Mobility Bed Mobility Overal bed mobility: Modified Independent             General bed mobility comments: OOB in chair on arrivaol  Transfers Overall transfer level: Needs assistance Equipment used: None Transfers: Sit to/from Stand Sit to Stand: Supervision         General transfer comment: Hands-on guarding required for power-up to full standing position. Pt declines RW.     Balance Overall balance assessment: Needs assistance Sitting-balance support: Feet supported;No upper extremity supported Sitting balance-Leahy Scale:  Fair     Standing balance support: No upper extremity supported Standing balance-Leahy Scale: Poor Standing balance comment: Occasional assist required.                            ADL either performed or assessed with clinical judgement   ADL Overall ADL's : Needs assistance/impaired Eating/Feeding: Set up;Sitting   Grooming: Supervision/safety;Standing   Upper Body Bathing: Set up;Sitting   Lower Body Bathing: Sit to/from stand;Min guard   Upper Body Dressing : Set up;Sitting   Lower Body Dressing: Sit to/from stand;Min guard   Toilet Transfer: Ambulation;Regular Toilet;Min guard   Toileting- Clothing Manipulation and Hygiene: Sit to/from stand;Min guard       Functional mobility during ADLs: Min guard General ADL Comments: Difficulty with higher level cognitive skills impacting safety with home management and medication management tasks. Pt unsteady on feet during standing ADL. Educated on fall prevention strategies.     Vision Baseline Vision/History: Wears glasses Wears Glasses: At all times Patient Visual Report: No change from baseline Vision Assessment?: No apparent visual deficits     Perception     Praxis      Pertinent Vitals/Pain Pain Assessment: No/denies pain     Hand Dominance Right   Extremity/Trunk Assessment Upper Extremity Assessment Upper Extremity Assessment: Overall WFL for tasks assessed   Lower Extremity Assessment Lower Extremity Assessment: Overall WFL for tasks assessed   Cervical / Trunk Assessment Cervical / Trunk Assessment: Kyphotic   Communication Communication Communication: No difficulties   Cognition Arousal/Alertness: Awake/alert Behavior During Therapy: WFL for tasks assessed/performed Overall Cognitive  Status: Impaired/Different from baseline Area of Impairment: Attention;Memory;Safety/judgement;Problem solving                   Current Attention Level: Sustained Memory: Decreased short-term  memory   Safety/Judgement: Decreased awareness of safety   Problem Solving: Slow processing;Difficulty sequencing;Requires verbal cues General Comments: Difficulty with higher level cognitive tasks related to medication management. Educated on compensatory strategies and pt reports understanding but demonstrates decreased awareness of deficits.   General Comments  Provided printed HEP and reviewed with pt    Exercises Exercises: General Lower Extremity General Exercises - Lower Extremity Ankle Circles/Pumps: 10 reps Long Arc Quad: 10 reps Hip ABduction/ADduction: 10 reps Hip Flexion/Marching: 10 reps   Shoulder Instructions      Home Living Family/patient expects to be discharged to:: Private residence Living Arrangements: Alone Available Help at Discharge:  (Potentially friends; family calls daily) Type of Home: House Home Access: Level entry     Home Layout: Two level;Able to live on main level with bedroom/bathroom (uses stair lift) Alternate Level Stairs-Number of Steps: 14 Alternate Level Stairs-Rails: Right;Left;Can reach both Bathroom Shower/Tub: Teacher, early years/pre: Standard Bathroom Accessibility: Yes   Home Equipment: None          Prior Functioning/Environment Level of Independence: Independent        Comments: Drives, plays bridge multiple times each week. Looking for company to assist with house cleaning.        OT Problem List: Decreased activity tolerance;Impaired balance (sitting and/or standing);Decreased cognition;Decreased safety awareness;Decreased knowledge of use of DME or AE      OT Treatment/Interventions: Self-care/ADL training;Therapeutic exercise;DME and/or AE instruction;Patient/family education;Cognitive remediation/compensation;Therapeutic activities    OT Goals(Current goals can be found in the care plan section) Acute Rehab OT Goals Patient Stated Goal: Return to PLOF OT Goal Formulation: With patient Time For  Goal Achievement: 06/17/16 Potential to Achieve Goals: Good ADL Goals Pt Will Perform Grooming: with modified independence;standing Pt Will Perform Lower Body Bathing: with modified independence;sit to/from stand Pt Will Perform Lower Body Dressing: with modified independence;sit to/from stand Pt Will Transfer to Toilet: with modified independence;ambulating;bedside commode Pt Will Perform Toileting - Clothing Manipulation and hygiene: with modified independence;sit to/from stand Additional ADL Goal #1: Pt will independently implement 3 strategies to compensate for decreased short-term memory in order to improve safety with medication management.  OT Frequency: Min 2X/week   Barriers to D/C:            Co-evaluation              End of Session Equipment Utilized During Treatment: Gait belt Nurse Communication: Mobility status  Activity Tolerance: Patient tolerated treatment well Patient left: in chair;with call bell/phone within reach;with chair alarm set  OT Visit Diagnosis: Unsteadiness on feet (R26.81)                Time: 5170-0174 OT Time Calculation (min): 24 min Charges:  OT General Charges $OT Visit: 1 Procedure OT Evaluation $OT Eval Moderate Complexity: 1 Procedure OT Treatments $Self Care/Home Management : 8-22 mins G-Codes: OT G-codes **NOT FOR INPATIENT CLASS** Functional Assessment Tool Used: AM-PAC 6 Clicks Daily Activity Functional Limitation: Self care Self Care Current Status (B4496): At least 40 percent but less than 60 percent impaired, limited or restricted Self Care Goal Status (P5916): At least 1 percent but less than 20 percent impaired, limited or restricted   Norman Herrlich, [place] OTR/L  Pager: [place] 06/03/2016,  1:15 PM

## 2016-06-03 NOTE — ED Notes (Signed)
Patient transported to CT and then XR

## 2016-06-03 NOTE — Consult Note (Signed)
Reason for Consult:SAH after fall Referring Physician: Faatimah Spielberg is an 83 y.o. female.  HPI: Patient is a Web designer, fiercely independent lady who lives alone with very little social support, although "I get out a lot and play bridge with my friends," who fell and hit her head last night. She has a h/o PMHx of CAD, DM2, HTN, osteoarthritis, TIA, and stroke, for which she takes plavix and aspirin. She was brought in by EMS to the Emergency Department for an evaluation s/p a possible syncopal episode that occurred ~4 hours PTA. Pt reports she was making her bed this evening when she suddenly "woke up on the floor." She states she does not remember her fall but believes she only lost consciousness for a few seconds. She notes associated residual left side pain s/p a recent fall that occurred while walking into a friend's home on a "rainy" day. Pt reports she lives alone and can ambulate independently without any assistance. She denies any other complaints. She says she has very little help and "is looking for someone to come in."  She says she has no family in town, still drives and shops.  She says she "doesn't want to go to one of those homes.  That's not going to happen."  Currently, she states the left side of her head is sore, but otherwise has no complaints.  Past Medical History:  Diagnosis Date  . ANEMIA, NORMOCYTIC 11/30/2009  . Atrial flutter ([place]) 04/12/2009  . CAD, NATIVE VESSEL 04/27/2008   Cardiac catheterization 6/08: EF 55%, 1+ MR, Dx 50%, LAD 50%, dLAD 40%, OM1 50-60% (up to 70%), pOM 30%, pRCA 40%, mRCA 50-60%, pPDA 50-60% (up to 70%).  Medical therapy was recommended  . Carotid stenosis    dopplers 8/41: LICA 66-06%  . COLONIC POLYPS, ADENOMATOUS 12/26/2009  . DIABETES MELLITUS, TYPE II 08/11/2006  . DIVERTICULOSIS, COLON 08/11/2006  . GERD 02/16/2007  . Hemorrhoids   . HEMORRHOIDS-INTERNAL 12/26/2009  . HYPERLIPIDEMIA 09/09/2006  . HYPERTENSION 09/09/2006  . LEFT BUNDLE  BRANCH BLOCK 08/11/2006  . OSTEOARTHRITIS 08/11/2006  . OSTEOPOROSIS 08/11/2006  . Stroke ([place]) 12-06-13  . TIA 09/02/2006   Echocardiogram 6/12: Mild to moderate MR, trace AI, trace TR, PASP 34, bubble study negative for intracardiac shunting, normal EF (greater than 55%)    Past Surgical History:  Procedure Laterality Date  . CAROTID ENDARTERECTOMY Left 12-05-2010  . CLOSED REDUCTION METATARSAL FRACTURE     right 5th  . COLONOSCOPY W/ POLYPECTOMY  2001  . EYE SURGERY     catarac  . LOOP RECORDER IMPLANT N/A 01/03/2014   Procedure: LOOP RECORDER IMPLANT;  Surgeon: Evans Lance, MD;  Location: [facility] CATH LAB;  Service: Cardiovascular;  Laterality: N/A;  . TONSILLECTOMY      Family History  Problem Relation Age of Onset  . Heart disease Mother 34  . Kidney disease Mother   . Heart attack Mother   . Congestive Heart Failure Father 94  . Colon cancer Brother   . Heart disease Brother   . Breast cancer Sister   . Uterine cancer Sister     Social History:  reports that she has never smoked. She has never used smokeless tobacco. She reports that she does not drink alcohol or use drugs.  Allergies:  Allergies  Allergen Reactions  . Nitrofurantoin     unknown  . Sulfamethoxazole-Trimethoprim     unknown  . Sulfonamide Derivatives     REACTION: rash    Medications:  I have reviewed the patient's current medications.  Results for orders placed or performed during the hospital encounter of 06/03/16 (from the past 48 hour(s))  CBC with Differential/Platelet     Status: Abnormal   Collection Time: 06/03/16  1:11 AM  Result Value Ref Range   WBC 9.1 4.0 - 10.5 K/uL   RBC 3.62 (L) 3.87 - 5.11 MIL/uL   Hemoglobin 11.3 (L) 12.0 - 15.0 g/dL   HCT 35.1 (L) 36.0 - 46.0 %   MCV 97.0 78.0 - 100.0 fL   MCH 31.2 26.0 - 34.0 pg   MCHC 32.2 30.0 - 36.0 g/dL   RDW 13.3 11.5 - 15.5 %   Platelets 167 150 - 400 K/uL   Neutrophils Relative % 67 %   Neutro Abs 6.0 1.7 - 7.7 K/uL    Lymphocytes Relative 24 %   Lymphs Abs 2.2 0.7 - 4.0 K/uL   Monocytes Relative 7 %   Monocytes Absolute 0.7 0.1 - 1.0 K/uL   Eosinophils Relative 2 %   Eosinophils Absolute 0.2 0.0 - 0.7 K/uL   Basophils Relative 0 %   Basophils Absolute 0.0 0.0 - 0.1 K/uL  Comprehensive metabolic panel     Status: Abnormal   Collection Time: 06/03/16  1:11 AM  Result Value Ref Range   Sodium 138 135 - 145 mmol/L   Potassium 4.0 3.5 - 5.1 mmol/L   Chloride 102 101 - 111 mmol/L   CO2 25 22 - 32 mmol/L   Glucose, Bld 170 (H) 65 - 99 mg/dL   BUN 23 (H) 6 - 20 mg/dL   Creatinine, Ser 1.08 (H) 0.44 - 1.00 mg/dL   Calcium 9.1 8.9 - 10.3 mg/dL   Total Protein 7.1 6.5 - 8.1 g/dL   Albumin 3.7 3.5 - 5.0 g/dL   AST 36 15 - 41 U/L   ALT 26 14 - 54 U/L   Alkaline Phosphatase 38 38 - 126 U/L   Total Bilirubin 0.3 0.3 - 1.2 mg/dL   GFR calc non Af Amer 43 (L) >60 mL/min   GFR calc Af Amer 49 (L) >60 mL/min    Comment: (NOTE) The eGFR has been calculated using the CKD EPI equation. This calculation has not been validated in all clinical situations. eGFR's persistently <60 mL/min signify possible Chronic Kidney Disease.    Anion gap 11 5 - 15  Lipase, blood     Status: Abnormal   Collection Time: 06/03/16  1:11 AM  Result Value Ref Range   Lipase 55 (H) 11 - 51 U/L  Magnesium     Status: None   Collection Time: 06/03/16  1:11 AM  Result Value Ref Range   Magnesium 1.8 1.7 - 2.4 mg/dL  I-stat troponin, ED     Status: None   Collection Time: 06/03/16  1:22 AM  Result Value Ref Range   Troponin i, poc 0.01 0.00 - 0.08 ng/mL   Comment 3            Comment: Due to the release kinetics of cTnI, a negative result within the first hours of the onset of symptoms does not rule out myocardial infarction with certainty. If myocardial infarction is still suspected, repeat the test at appropriate intervals.   Ethanol     Status: None   Collection Time: 06/03/16  2:32 AM  Result Value Ref Range   Alcohol,  Ethyl (B) <5 <5 mg/dL    Comment:        LOWEST DETECTABLE LIMIT  FOR SERUM ALCOHOL IS 5 mg/dL FOR MEDICAL PURPOSES ONLY   CBG monitoring, ED     Status: Abnormal   Collection Time: 06/03/16  2:36 AM  Result Value Ref Range   Glucose-Capillary 183 (H) 65 - 99 mg/dL   Comment 1 Notify RN   Rapid urine drug screen (hospital performed)     Status: None   Collection Time: 06/03/16  3:05 AM  Result Value Ref Range   Opiates NONE DETECTED NONE DETECTED   Cocaine NONE DETECTED NONE DETECTED   Benzodiazepines NONE DETECTED NONE DETECTED   Amphetamines NONE DETECTED NONE DETECTED   Tetrahydrocannabinol NONE DETECTED NONE DETECTED   Barbiturates NONE DETECTED NONE DETECTED    Comment:        DRUG SCREEN FOR MEDICAL PURPOSES ONLY.  IF CONFIRMATION IS NEEDED FOR ANY PURPOSE, NOTIFY LAB WITHIN 5 DAYS.        LOWEST DETECTABLE LIMITS FOR URINE DRUG SCREEN Drug Class       Cutoff (ng/mL) Amphetamine      1000 Barbiturate      200 Benzodiazepine   650 Tricyclics       354 Opiates          300 Cocaine          300 THC              50   Urinalysis, Routine w reflex microscopic     Status: Abnormal   Collection Time: 06/03/16  3:05 AM  Result Value Ref Range   Color, Urine STRAW (A) YELLOW   APPearance CLEAR CLEAR   Specific Gravity, Urine 1.006 1.005 - 1.030   pH 5.0 5.0 - 8.0   Glucose, UA NEGATIVE NEGATIVE mg/dL   Hgb urine dipstick NEGATIVE NEGATIVE   Bilirubin Urine NEGATIVE NEGATIVE   Ketones, ur NEGATIVE NEGATIVE mg/dL   Protein, ur NEGATIVE NEGATIVE mg/dL   Nitrite NEGATIVE NEGATIVE   Leukocytes, UA LARGE (A) NEGATIVE   RBC / HPF 0-5 0 - 5 RBC/hpf   WBC, UA 6-30 0 - 5 WBC/hpf   Bacteria, UA RARE (A) NONE SEEN   Squamous Epithelial / LPF 0-5 (A) NONE SEEN   Mucous PRESENT   Glucose, capillary     Status: Abnormal   Collection Time: 06/03/16  6:08 AM  Result Value Ref Range   Glucose-Capillary 128 (H) 65 - 99 mg/dL   Comment 1 Notify RN    Comment 2 Document in Chart       Dg Chest 2 View  Result Date: 06/03/2016 CLINICAL DATA:  Syncope EXAM: CHEST  2 VIEW COMPARISON:  Chest CT 03/16/2015, CXR 03/15/2015 FINDINGS: Heart is top-normal in size. There is aortic atherosclerosis at the arch. External cardiac monitoring device projects over the left heart border. Chronic stable diffuse interstitial prominence is seen in both lung apices and lung bases most of which appear to represent areas of interstitial fibrosis and/or scarring. No acute nor suspicious osseous abnormalities. IMPRESSION: Chronic diffuse interstitial prominence of the lungs consistent with interstitial fibrosis and/or scarring. Aortic atherosclerosis. Electronically Signed   By: Ashley Royalty M.D.   On: 06/03/2016 02:16   Ct Head Wo Contrast  Result Date: 06/03/2016 CLINICAL DATA:  Syncope EXAM: CT HEAD WITHOUT CONTRAST TECHNIQUE: Contiguous axial images were obtained from the base of the skull through the vertex without intravenous contrast. COMPARISON:  10/25/2013 head CT FINDINGS: BRAIN: There is mild sulcal and ventricular prominence consistent with superficial and central atrophy. Tiny focus of subarachnoid hemorrhage noted  in the right posterior frontal lobe near the vertex, series 3, image 28, series 5, image 38. Moderate degree of small vessel ischemic disease in the periventricular and subcortical white matter. Basal ganglial calcifications are seen bilaterally. No intra-axial mass nor extra-axial fluid. VASCULAR: Moderate calcific atherosclerosis of the carotid siphons. SKULL: No skull fracture.  Left posterior parietal scalp hematoma. SINUSES/ORBITS: The mastoid air-cells are clear. The included paranasal sinuses are well-aerated.The included ocular globes and orbital contents are non-suspicious. OTHER: None. IMPRESSION: 1. Left posterior parietal scalp hematoma without underlying fracture. 2. Tiny focus of acute subarachnoid hemorrhage in the posterior high frontal lobe near the vertex. This may  represent contrecoup injury. Critical Value/emergent results were called by telephone at the time of interpretation on 06/03/2016 at 2:12 am to Dr. Everlene Balls , who verbally acknowledged these results. Electronically Signed   By: Ashley Royalty M.D.   On: 06/03/2016 02:13    Review of Systems - Negative except syncope.  She has history of prior TIAs.    Blood pressure 121/65, pulse 78, temperature 98.4 F (36.9 C), resp. rate (!) 24, height 5' 6"  (1.676 m), weight 56.9 kg (125 lb 7.1 oz), SpO2 97 %. Physical Exam  Constitutional: She is oriented to person, place, and time. She appears well-developed and well-nourished.  HENT:  Head: Head is with contusion.    Eyes: Conjunctivae and EOM are normal. Pupils are equal, round, and reactive to light.  Neck: Trachea normal and normal range of motion. Neck supple.  Neurological: She is alert and oriented to person, place, and time. She has normal strength and normal reflexes. No cranial nerve deficit or sensory deficit. GCS eye subscore is 4. GCS verbal subscore is 5. GCS motor subscore is 6.  No pronator drift    Assessment/Plan: Patient is pleasant, independent 83 year old lady who lives alone, who fell and struck her head with brief LOC.  She is on plavix.  She says her balance is poor.  She has a small SAH in the high posterior frontal region on the right. She should be evaluated by PT for balance issues and safety of home environment.  She should have assistance in obtaining social support as she is completely independent and lives alone with no family support.  She is still shopping and driving.  She does not want to go to a nursing facility, but will need greater support to maintain her independence and safety.  A discussion should be help with her primary physician (Dr. Elease Hashimoto) regarding risk/benefit of restarting plavix, given fall, LOC, SAH.  She needs no further workup for [facility], including f/u CT, unless patient becomes increasingly  symptomatic.  I will consult social work.  Peggyann Shoals, MD 06/03/2016, 7:26 AM

## 2016-06-03 NOTE — Progress Notes (Addendum)
Patient admitted after midnight, will plan to observe overnight and d/c in AM if neuro status stable.  Home health ordered per PT recommendations.  Appears to be on plavix/ASA for CAD-- no recent stents- patient to follow up with cardiology.  Eulogio Bear DO

## 2016-06-03 NOTE — ED Provider Notes (Addendum)
[facility] DEPT Provider Note   CSN: [id number] Arrival date & time: 06/03/16  8756  By signing my name below, I, Higinio Plan, attest that this documentation has been prepared under the direction and in the presence of Everlene Balls, MD . Electronically Signed: Higinio Plan, Scribe. 06/03/2016. 1:02 AM.  History   Chief Complaint Chief Complaint  Patient presents with  . Fall  . Loss of Consciousness   The history is provided by the patient. No language interpreter was used.   HPI Comments: [name] is a 83 y.o. female with PMHx of CAD, DM2, HTN, osteoarthritis, TIA, and stroke, brought in by EMS to the Emergency Department for an evaluation s/p a possible syncopal episode that occurred ~4 hours PTA. Pt reports she was making her bed this evening when she suddenly "woke up on the floor." She states she does not remember her fall but believes she only lost consciousness for a few seconds. She notes associated residual left side pain s/p a recent fall that occurred while walking into a friend's home on a "rainy" day. Pt reports she lives alone and can ambulate independently without any assistance. She denies any other complaints.   Past Medical History:  Diagnosis Date  . ANEMIA, NORMOCYTIC 11/30/2009  . Atrial flutter ([place]) 04/12/2009  . CAD, NATIVE VESSEL 04/27/2008   Cardiac catheterization 6/08: EF 55%, 1+ MR, Dx 50%, LAD 50%, dLAD 40%, OM1 50-60% (up to 70%), pOM 30%, pRCA 40%, mRCA 50-60%, pPDA 50-60% (up to 70%).  Medical therapy was recommended  . Carotid stenosis    dopplers 4/33: LICA 29-51%  . COLONIC POLYPS, ADENOMATOUS 12/26/2009  . DIABETES MELLITUS, TYPE II 08/11/2006  . DIVERTICULOSIS, COLON 08/11/2006  . GERD 02/16/2007  . Hemorrhoids   . HEMORRHOIDS-INTERNAL 12/26/2009  . HYPERLIPIDEMIA 09/09/2006  . HYPERTENSION 09/09/2006  . LEFT BUNDLE BRANCH BLOCK 08/11/2006  . OSTEOARTHRITIS 08/11/2006  . OSTEOPOROSIS 08/11/2006  . Stroke ([place]) 12-06-13  . TIA 09/02/2006   Echocardiogram 6/12: Mild to moderate MR, trace AI, trace TR, PASP 34, bubble study negative for intracardiac shunting, normal EF (greater than 55%)    Patient Active Problem List   Diagnosis Date Noted  . ILD (interstitial lung disease) ([place]) 04/06/2015  . Cough 04/06/2015  . Shortness of breath 03/15/2015  . Dyspnea 03/15/2015  . SOB (shortness of breath) 03/09/2015  . Perennial allergic rhinitis 01/17/2014  . Syncope and collapse 11/04/2013  . Transient alteration of awareness 11/04/2013  . Syncope 10/15/2013  . Chronic insomnia 06/30/2013  . Aftercare following surgery of the circulatory system, [place] 06/22/2013  . Seasonal allergies 06/08/2013  . Normocytic anemia 10/29/2012  . Benign paroxysmal positional vertigo 06/16/2012  . Vertigo 03/23/2012  . Occlusion and stenosis of carotid artery without mention of cerebral infarction 06/11/2011  . Carotid stenosis 10/18/2010  . Amaurosis fugax 10/18/2010  . Palpitations 06/14/2010  . KNEE PAIN 01/24/2010  . COLONIC POLYPS, ADENOMATOUS 12/26/2009  . Internal and external bleeding hemorrhoids 12/26/2009  . CONSTIPATION 12/26/2009  . ANEMIA, NORMOCYTIC 11/30/2009  . FATIGUE / MALAISE 11/27/2009  . Atrial flutter ([place]) 04/12/2009  . COLONIC POLYPS, HYPERPLASTIC, HX OF 03/01/2009  . CAD, NATIVE VESSEL 04/27/2008  . GERD 02/16/2007  . Hyperlipidemia 09/09/2006  . Essential hypertension 09/09/2006  . Transient cerebral ischemia 09/02/2006  . Type 2 diabetes mellitus, controlled ([place]) 08/11/2006  . LEFT BUNDLE BRANCH BLOCK 08/11/2006  . DIVERTICULOSIS, COLON 08/11/2006  . Osteoarthritis 08/11/2006  . OSTEOPOROSIS 08/11/2006    Past Surgical History:  Procedure Laterality Date  . CAROTID ENDARTERECTOMY Left 12-05-2010  . CLOSED REDUCTION METATARSAL FRACTURE     right 5th  . COLONOSCOPY W/ POLYPECTOMY  2001  . EYE SURGERY     catarac  . LOOP RECORDER IMPLANT N/A 01/03/2014   Procedure: LOOP RECORDER IMPLANT;  Surgeon: Evans Lance, MD;  Location: [facility] CATH LAB;  Service: Cardiovascular;  Laterality: N/A;  . TONSILLECTOMY      OB History    No data available     Home Medications    Prior to Admission medications   Medication Sig Start Date End Date Taking? Authorizing Provider  Ascorbic Acid (VITAMIN C PO) Take 1 tablet by mouth daily.    Yes Historical Provider, MD  aspirin 81 MG tablet Take 81 mg by mouth daily.     Yes Historical Provider, MD  atorvastatin (LIPITOR) 20 MG tablet TAKE 1 TABLET DAILY 05/31/16  Yes Liliane Shi, PA-C  calcium carbonate 200 MG capsule Take 200 mg by mouth daily.    Yes Historical Provider, MD  Cholecalciferol (VITAMIN D PO) Take 1 tablet by mouth daily.    Yes Historical Provider, MD  clopidogrel (PLAVIX) 75 MG tablet TAKE 1 TABLET DAILY 01/01/16  Yes Eulas Post, MD  Cyanocobalamin (VITAMIN B 12 PO) Take 1 tablet by mouth daily.    Yes Historical Provider, MD  fish oil-omega-3 fatty acids 1000 MG capsule Take 1 capsule by mouth daily.    Yes Historical Provider, MD  furosemide (LASIX) 20 MG tablet Take 1 tablet (20 mg total) by mouth every other day. 03/20/15  Yes Velvet Bathe, MD  ipratropium-albuterol (DUONEB) 0.5-2.5 (3) MG/3ML SOLN Take 4 times a day and every 4 hours as needed for diff breathing 04/11/15  Yes Jose Angelo A Corrie Dandy, MD  metoprolol tartrate (LOPRESSOR) 25 MG tablet TAKE ONE-HALF (1/2) TABLET TWICE A DAY 12/18/15  Yes Eulas Post, MD  Multiple Vitamins-Minerals (MULTIVITAMIN WITH MINERALS) tablet Take 1 tablet by mouth daily.   Yes Historical Provider, MD  pantoprazole (PROTONIX) 20 MG tablet TAKE 1 TABLET EVERY OTHER DAY 09/25/15  Yes Eulas Post, MD  PROAIR RESPICLICK [phone] Base) MCG/ACT AEPB Inhale 2 puffs into the lungs every 4 (four) hours as needed. 04/04/15  Yes Eulas Post, MD  VITAMIN E PO Take 1 tablet by mouth daily.    Yes Historical Provider, MD  zolpidem (AMBIEN) 10 MG tablet Take 1 tablet (10 mg total) by mouth at bedtime as  needed for sleep. 03/05/16 06/03/16 Yes Eulas Post, MD  gabapentin (NEURONTIN) 100 MG capsule TAKE ONE CAPSULE BY MOUTH TWICE DAILY Patient not taking: Reported on 06/03/2016 11/13/15   Eulas Post, MD  glucose blood test strip Use once daily 12/01/14   Eulas Post, MD  nystatin (MYCOSTATIN) 100000 UNIT/ML suspension Take 5 mLs (500,000 Units total) by mouth 2 (two) times daily. Patient not taking: Reported on 06/03/2016 04/18/15   Rigoberto Noel, MD  ONE TOUCH LANCETS MISC Use daily as directed 03/25/11   Eulas Post, MD    Family History Family History  Problem Relation Age of Onset  . Heart disease Mother 31  . Kidney disease Mother   . Heart attack Mother   . Congestive Heart Failure Father 25  . Colon cancer Brother   . Heart disease Brother   . Breast cancer Sister   . Uterine cancer Sister     Social History Social History  Substance  Use Topics  . Smoking status: Never Smoker  . Smokeless tobacco: Never Used  . Alcohol use No   Allergies   Nitrofurantoin; Sulfamethoxazole-trimethoprim; and Sulfonamide derivatives  Review of Systems Review of Systems  Constitutional: Negative for fever.  Neurological: Positive for syncope (possible syncope).  All other systems reviewed and are negative.  Physical Exam Updated Vital Signs BP (!) 191/92 (BP Location: Right Arm)   Pulse 90   Temp 98.4 F (36.9 C) (Oral)   Ht 5\' 6"  (1.676 m)   Wt 125 lb (56.7 kg)   SpO2 99%   BMI 20.18 kg/m   Physical Exam  Constitutional: She is oriented to person, place, and time. She appears well-developed and well-nourished. No distress.  HENT:  Head: Normocephalic.  Nose: Nose normal.  Mouth/Throat: Oropharynx is clear and moist. No oropharyngeal exudate.  L parietal hematoma  Eyes: Conjunctivae and EOM are normal. Pupils are equal, round, and reactive to light. No scleral icterus.  Neck: Normal range of motion. Neck supple. No JVD present. No tracheal deviation  present. No thyromegaly present.  Cardiovascular: Normal rate, regular rhythm and normal heart sounds.  Exam reveals no gallop and no friction rub.   No murmur heard. Pulmonary/Chest: Effort normal and breath sounds normal. No respiratory distress. She has no wheezes. She exhibits no tenderness.  Abdominal: Soft. Bowel sounds are normal. She exhibits no distension and no mass. There is no tenderness. There is no rebound and no guarding.  Musculoskeletal: Normal range of motion. She exhibits no edema or tenderness.  Lymphadenopathy:    She has no cervical adenopathy.  Neurological: She is alert and oriented to person, place, and time. No cranial nerve deficit. She exhibits normal muscle tone.  Normal strength and sensation in all extremities. Normal cerebellar exam.   Skin: Skin is warm and dry. No rash noted. No erythema. No pallor.  Nursing note and vitals reviewed.  ED Treatments / Results  DIAGNOSTIC STUDIES:  Oxygen Saturation is 98% on RA, normal by my interpretation.    COORDINATION OF CARE:  12:52 AM Discussed treatment plan with pt at bedside and pt agreed to plan.  Labs (all labs ordered are listed, but only abnormal results are displayed) Labs Reviewed  CBC WITH DIFFERENTIAL/PLATELET - Abnormal; Notable for the following:       Result Value   RBC 3.62 (*)    Hemoglobin 11.3 (*)    HCT 35.1 (*)    All other components within normal limits  COMPREHENSIVE METABOLIC PANEL - Abnormal; Notable for the following:    Glucose, Bld 170 (*)    BUN 23 (*)    Creatinine, Ser 1.08 (*)    GFR calc non Af Amer 43 (*)    GFR calc Af Amer 49 (*)    All other components within normal limits  LIPASE, BLOOD - Abnormal; Notable for the following:    Lipase 55 (*)    All other components within normal limits  URINE CULTURE  MAGNESIUM  ETHANOL  RAPID URINE DRUG SCREEN, HOSP PERFORMED  URINALYSIS, ROUTINE W REFLEX MICROSCOPIC  I-STAT TROPOININ, ED  CBG MONITORING, ED    EKG  EKG  Interpretation  Date/Time:  Monday June 03 2016 00:54:04 EDT Ventricular Rate:  85 PR Interval:    QRS Duration: 131 QT Interval:  424 QTC Calculation: 505 R Axis:   14 Text Interpretation:  Sinus rhythm Left bundle branch block Prolonged QT new since last tracing Confirmed by Glynn Octave [phone]) on 06/03/2016  1:01:58 AM       Radiology Dg Chest 2 View  Result Date: 06/03/2016 CLINICAL DATA:  Syncope EXAM: CHEST  2 VIEW COMPARISON:  Chest CT 03/16/2015, CXR 03/15/2015 FINDINGS: Heart is top-normal in size. There is aortic atherosclerosis at the arch. External cardiac monitoring device projects over the left heart border. Chronic stable diffuse interstitial prominence is seen in both lung apices and lung bases most of which appear to represent areas of interstitial fibrosis and/or scarring. No acute nor suspicious osseous abnormalities. IMPRESSION: Chronic diffuse interstitial prominence of the lungs consistent with interstitial fibrosis and/or scarring. Aortic atherosclerosis. Electronically Signed   By: Ashley Royalty M.D.   On: 06/03/2016 02:16   Ct Head Wo Contrast  Result Date: 06/03/2016 CLINICAL DATA:  Syncope EXAM: CT HEAD WITHOUT CONTRAST TECHNIQUE: Contiguous axial images were obtained from the base of the skull through the vertex without intravenous contrast. COMPARISON:  10/25/2013 head CT FINDINGS: BRAIN: There is mild sulcal and ventricular prominence consistent with superficial and central atrophy. Tiny focus of subarachnoid hemorrhage noted in the right posterior frontal lobe near the vertex, series 3, image 28, series 5, image 38. Moderate degree of small vessel ischemic disease in the periventricular and subcortical white matter. Basal ganglial calcifications are seen bilaterally. No intra-axial mass nor extra-axial fluid. VASCULAR: Moderate calcific atherosclerosis of the carotid siphons. SKULL: No skull fracture.  Left posterior parietal scalp hematoma. SINUSES/ORBITS:  The mastoid air-cells are clear. The included paranasal sinuses are well-aerated.The included ocular globes and orbital contents are non-suspicious. OTHER: None. IMPRESSION: 1. Left posterior parietal scalp hematoma without underlying fracture. 2. Tiny focus of acute subarachnoid hemorrhage in the posterior high frontal lobe near the vertex. This may represent contrecoup injury. Critical Value/emergent results were called by telephone at the time of interpretation on 06/03/2016 at 2:12 am to Dr. Everlene Balls , who verbally acknowledged these results. Electronically Signed   By: Ashley Royalty M.D.   On: 06/03/2016 02:13    Procedures Procedures (including critical care time)  Medications Ordered in ED Medications - No data to display  Initial Impression / Assessment and Plan / ED Course  I have reviewed the triage vital signs and the nursing notes.  Pertinent labs & imaging results that were available during my care of the patient were reviewed by me and considered in my medical decision making (see chart for details).     Patient presents to the ED for syncopal episode.  Currently her neurological exam is normal.  Will perform syncope work up including CT head given trauma found on exam.  She currently does not want anything for pain and feels back to her normal baseline.  2:27 AM CT reveals a small SAH in the frontal lobe.  I spoke with Dr. Vertell Limber with neurosurgery who states this patient is not a surgical candidate given her age and how small the bleed is.  They will consult on the patient and follow in the hospital. Recommend for hospitalist admission.    CRITICAL CARE Performed by: Everlene Balls   Total critical care time: 45 minutes [facility]  Critical care time was exclusive of separately billable procedures and treating other patients.  Critical care was necessary to treat or prevent imminent or life-threatening deterioration.  Critical care was time spent personally by me on the  following activities: development of treatment plan with patient and/or surrogate as well as nursing, discussions with consultants, evaluation of patient's response to treatment, examination of patient, obtaining history from patient  or surrogate, ordering and performing treatments and interventions, ordering and review of laboratory studies, ordering and review of radiographic studies, pulse oximetry and re-evaluation of patient's condition.   I personally performed the services described in this documentation, which was scribed in my presence. The recorded information has been reviewed and is accurate.     Final Clinical Impressions(s) / ED Diagnoses   Final diagnoses:  Syncope, unspecified syncope type  SAH (subarachnoid hemorrhage) ([place])    New Prescriptions New Prescriptions   No medications on file      Everlene Balls, MD 06/03/16 [phone]

## 2016-06-03 NOTE — Evaluation (Addendum)
Physical Therapy Evaluation Patient Details Name: [name] MRN: [id number] DOB: 09/23/1920 Today's Date: 06/03/2016   History of Present Illness  Pt is a 83 y/o female who presents s/p fall at home sustaining a SAH. PMH significant for CVA, OA, osteoporosis, L BBB, HTN, DMII.  Clinical Impression  Pt admitted with above diagnosis. Pt currently with functional limitations due to the deficits listed below (see PT Problem List). At the time of PT eval pt was able to perform transfers and ambulation with min guard to min assist for balance support and safety. Pt with occasional lateral LOB in hall during gait training, however safety awareness appears low. Pt able to verbalize that she is not at baseline and attempts to justify why she is losing her balance and feeling tired. However, pt does not feel she needs an AD despite these deficits. Increased education provided for safety awareness and use of SPC. Pt will benefit from skilled PT to increase their independence and safety with mobility to allow discharge to the venue listed below.     Follow Up Recommendations Home health PT;Supervision for mobility/OOB    Equipment Recommendations  None recommended by PT    Recommendations for Other Services       Precautions / Restrictions Precautions Precautions: Fall Precaution Comments: Unsteady Restrictions Weight Bearing Restrictions: No      Mobility  Bed Mobility Overal bed mobility: Modified Independent             General bed mobility comments: HOB slightly elevated and min use of rails required. Pt was able to transition to EOB without assistance.   Transfers Overall transfer level: Needs assistance Equipment used: None Transfers: Sit to/from Stand Sit to Stand: Min guard         General transfer comment: Hands-on guarding required for power-up to full standing position. Pt declines RW.   Ambulation/Gait Ambulation/Gait assistance: Min assist Ambulation Distance  (Feet): 300 Feet Assistive device: Straight cane Gait Pattern/deviations: Step-through pattern;Decreased stride length;Trunk flexed;Narrow base of support Gait velocity: Decreased Gait velocity interpretation: Below normal speed for age/gender General Gait Details: Pt with 1 LOB upon initiation of gait training. She then demonstrated decreased awareness of environment and tripped on the leg of BSC over toilet in bathroom. Pt required min assist to recover. Pt was then given [facility] for support for the rest of gait training, with occasional min assist required for balance support (LOB to R and L).   Stairs            Wheelchair Mobility    Modified Rankin (Stroke Patients Only) Modified Rankin (Stroke Patients Only) Pre-Morbid Rankin Score: No significant disability Modified Rankin: Moderately severe disability     Balance Overall balance assessment: Needs assistance Sitting-balance support: Feet supported;No upper extremity supported Sitting balance-Leahy Scale: Fair     Standing balance support: No upper extremity supported Standing balance-Leahy Scale: Poor Standing balance comment: Occasional assist required.                              Pertinent Vitals/Pain Pain Assessment: No/denies pain    Home Living Family/patient expects to be discharged to:: Private residence Living Arrangements: Alone Available Help at Discharge: Friend(s);Available PRN/intermittently (Family calls daily) Type of Home: House Home Access: Level entry     Home Layout: Two level;Able to live on main level with bedroom/bathroom Home Equipment: Cane - single point (Stair lift to enter basement)  Prior Function Level of Independence: Independent         Comments: Drives, plays bridge multiple times each week. Looking for company to assist with house cleaning.     Hand Dominance   Dominant Hand: Right    Extremity/Trunk Assessment   Upper Extremity Assessment Upper  Extremity Assessment: Defer to OT evaluation    Lower Extremity Assessment Lower Extremity Assessment: Overall WFL for tasks assessed (Strength 5/5 in quads, hams, hip flexors)    Cervical / Trunk Assessment Cervical / Trunk Assessment: Kyphotic  Communication   Communication: No difficulties  Cognition Arousal/Alertness: Awake/alert Behavior During Therapy: WFL for tasks assessed/performed Overall Cognitive Status: Impaired/Different from baseline Area of Impairment: Safety/judgement;Problem solving;Attention                   Current Attention Level: Sustained Memory: Decreased short-term memory   Safety/Judgement: Decreased awareness of safety   Problem Solving: Slow processing;Difficulty sequencing;Requires verbal cues General Comments: Difficulty with higher level cognitive tasks related to medication management. Educated on compensatory strategies and pt reports understanding but demonstrates decreased awareness of deficits.      General Comments General comments (skin integrity, edema, etc.): Provided printed HEP and reviewed with pt    Exercises General Exercises - Lower Extremity Ankle Circles/Pumps: 10 reps Long Arc Quad: 10 reps Hip ABduction/ADduction: 10 reps Hip Flexion/Marching: 10 reps   Assessment/Plan    PT Assessment Patient needs continued PT services  PT Problem List Decreased strength;Decreased range of motion;Decreased activity tolerance;Decreased balance;Decreased mobility;Decreased knowledge of use of DME;Decreased safety awareness;Decreased knowledge of precautions;Pain       PT Treatment Interventions DME instruction;Gait training;Stair training;Functional mobility training;Therapeutic activities;Therapeutic exercise;Neuromuscular re-education;Patient/family education    PT Goals (Current goals can be found in the Care Plan section)  Acute Rehab PT Goals Patient Stated Goal: Return to PLOF PT Goal Formulation: With patient Time For  Goal Achievement: 06/10/16 Potential to Achieve Goals: Good    Frequency Min 3X/week   Barriers to discharge Decreased caregiver support Pt lives alone    Co-evaluation               End of Session Equipment Utilized During Treatment: Gait belt Activity Tolerance: Patient tolerated treatment well Patient left: in chair;with call bell/phone within reach;with chair alarm set Nurse Communication: Mobility status PT Visit Diagnosis: Unsteadiness on feet (R26.81);Repeated falls (R29.6)    Time: 8416-6063 PT Time Calculation (min) (ACUTE ONLY): 48 min   Charges:   PT Evaluation $PT Eval Moderate Complexity: 1 Procedure PT Treatments $Gait Training: 23-37 mins   PT G Codes:   PT G-Codes **NOT FOR INPATIENT CLASS** Functional Assessment Tool Used: Clinical judgement;AM-PAC 6 Clicks Basic Mobility Functional Limitation: Mobility: Walking and moving around Mobility: Walking and Moving Around Current Status (K1601): At least 40 percent but less than 60 percent impaired, limited or restricted Mobility: Walking and Moving Around Goal Status [phone]): At least 20 percent but less than 40 percent impaired, limited or restricted    Rolinda Roan, PT, DPT Acute Rehabilitation Services Pager: [place] 06/03/2016, 11:47 AM

## 2016-06-03 NOTE — Progress Notes (Signed)
Pt came in at 0400 from home to [facility] ED brought by EMS  S?P pass out at home.  Pt oriented to unit and use of call light safety measures and fall precaution discussed with pt and pt verbaliezd good understanding. Initial assessment done no neuro deficit.  Cardiac monitor in use. IV fluid started .  No acute distress noted.

## 2016-06-03 NOTE — ED Notes (Signed)
Pt returned from xray and CT.

## 2016-06-03 NOTE — ED Triage Notes (Signed)
Per EMS. Pt reports she "passed out for a few seconds" but got up and "was not feeling herself".  She has hx of HNT, TIA and diabetic.

## 2016-06-03 NOTE — Care Management Note (Signed)
Case Management Note  Patient Details  Name: MERINDA VICTORINO MRN: [id number] Date of Birth: 23-May-1920  Subjective/Objective:                    Action/Plan: Pt with [facility] orders. CM met with the patient and provided her a list of [facility] agencies. She selected [facility]. Jermaine with [facility] notified and accepted the referral.  Pt states her neighbors will provide transportation home and are available to assist some at home.  Pt was interested in having some assistance at home. CM provided her a private duty list and informed her that these services are not covered under her insurance. Pt voiced understanding.   Expected Discharge Date:  06/05/16               Expected Discharge Plan:  [place]  In-House Referral:     Discharge planning Services  CM Consult  Post Acute Care Choice:  Home Health Choice offered to:  Patient  DME Arranged:    DME Agency:     HH Arranged:  [place]:  [place]  Status of Service:  In process, will continue to follow  If discussed at Long Length of Stay Meetings, dates discussed:    Additional Comments:  Pollie Friar, RN 06/03/2016, 4:34 PM

## 2016-06-03 NOTE — H&P (Signed)
History and Physical    TERRANCE LANAHAN RJJ:[id number] DOB: [birth year] DOA: 06/03/2016  PCP: Eulas Post, MD  Patient coming from: Home  I have personally briefly reviewed patient's old medical records in [place]  Chief Complaint: Syncope  HPI: [name] is a 83 y.o. female with medical history significant of CAD, DM2, HTN, TIA, a.flutter.  Patient brought in to ED by EMS for evaluation of syncopal episode that occurred 4 hrs PTA.  Was making bed this evening when next she knew she "woke up on floor".  Doesn't remember fall, thinks she only had LOC for a few seconds.  Lives alone and ambulates independently at baseline.   ED Course: CT head shows Frontal lobe traumatic small SAH.   Review of Systems: As per HPI otherwise 10 point review of systems negative.   Past Medical History:  Diagnosis Date  . ANEMIA, NORMOCYTIC 11/30/2009  . Atrial flutter ([place]) 04/12/2009  . CAD, NATIVE VESSEL 04/27/2008   Cardiac catheterization 6/08: EF 55%, 1+ MR, Dx 50%, LAD 50%, dLAD 40%, OM1 50-60% (up to 70%), pOM 30%, pRCA 40%, mRCA 50-60%, pPDA 50-60% (up to 70%).  Medical therapy was recommended  . Carotid stenosis    dopplers 0/16: LICA 01-09%  . COLONIC POLYPS, ADENOMATOUS 12/26/2009  . DIABETES MELLITUS, TYPE II 08/11/2006  . DIVERTICULOSIS, COLON 08/11/2006  . GERD 02/16/2007  . Hemorrhoids   . HEMORRHOIDS-INTERNAL 12/26/2009  . HYPERLIPIDEMIA 09/09/2006  . HYPERTENSION 09/09/2006  . LEFT BUNDLE BRANCH BLOCK 08/11/2006  . OSTEOARTHRITIS 08/11/2006  . OSTEOPOROSIS 08/11/2006  . Stroke ([place]) 12-06-13  . TIA 09/02/2006   Echocardiogram 6/12: Mild to moderate MR, trace AI, trace TR, PASP 34, bubble study negative for intracardiac shunting, normal EF (greater than 55%)    Past Surgical History:  Procedure Laterality Date  . CAROTID ENDARTERECTOMY Left 12-05-2010  . CLOSED REDUCTION METATARSAL FRACTURE     right 5th  . COLONOSCOPY W/ POLYPECTOMY  2001  . EYE SURGERY     catarac   . LOOP RECORDER IMPLANT N/A 01/03/2014   Procedure: LOOP RECORDER IMPLANT;  Surgeon: Evans Lance, MD;  Location: [facility] CATH LAB;  Service: Cardiovascular;  Laterality: N/A;  . TONSILLECTOMY       reports that she has never smoked. She has never used smokeless tobacco. She reports that she does not drink alcohol or use drugs.  Allergies  Allergen Reactions  . Nitrofurantoin     unknown  . Sulfamethoxazole-Trimethoprim     unknown  . Sulfonamide Derivatives     REACTION: rash    Family History  Problem Relation Age of Onset  . Heart disease Mother 22  . Kidney disease Mother   . Heart attack Mother   . Congestive Heart Failure Father 77  . Colon cancer Brother   . Heart disease Brother   . Breast cancer Sister   . Uterine cancer Sister      Prior to Admission medications   Medication Sig Start Date End Date Taking? Authorizing Provider  Ascorbic Acid (VITAMIN C PO) Take 1 tablet by mouth daily.    Yes Historical Provider, MD  aspirin 81 MG tablet Take 81 mg by mouth daily.     Yes Historical Provider, MD  atorvastatin (LIPITOR) 20 MG tablet TAKE 1 TABLET DAILY 05/31/16  Yes Liliane Shi, PA-C  calcium carbonate 200 MG capsule Take 200 mg by mouth daily.    Yes Historical Provider, MD  Cholecalciferol (VITAMIN D PO) Take 1  tablet by mouth daily.    Yes Historical Provider, MD  clopidogrel (PLAVIX) 75 MG tablet TAKE 1 TABLET DAILY 01/01/16  Yes Eulas Post, MD  Cyanocobalamin (VITAMIN B 12 PO) Take 1 tablet by mouth daily.    Yes Historical Provider, MD  fish oil-omega-3 fatty acids 1000 MG capsule Take 1 capsule by mouth daily.    Yes Historical Provider, MD  furosemide (LASIX) 20 MG tablet Take 1 tablet (20 mg total) by mouth every other day. 03/20/15  Yes Velvet Bathe, MD  ipratropium-albuterol (DUONEB) 0.5-2.5 (3) MG/3ML SOLN Take 4 times a day and every 4 hours as needed for diff breathing 04/11/15  Yes Jose Angelo A Corrie Dandy, MD  metoprolol tartrate (LOPRESSOR) 25 MG  tablet TAKE ONE-HALF (1/2) TABLET TWICE A DAY 12/18/15  Yes Eulas Post, MD  Multiple Vitamins-Minerals (MULTIVITAMIN WITH MINERALS) tablet Take 1 tablet by mouth daily.   Yes Historical Provider, MD  pantoprazole (PROTONIX) 20 MG tablet TAKE 1 TABLET EVERY OTHER DAY 09/25/15  Yes Eulas Post, MD  PROAIR RESPICLICK 710 [phone] Base) MCG/ACT AEPB Inhale 2 puffs into the lungs every 4 (four) hours as needed. 04/04/15  Yes Eulas Post, MD  VITAMIN E PO Take 1 tablet by mouth daily.    Yes Historical Provider, MD  zolpidem (AMBIEN) 10 MG tablet Take 1 tablet (10 mg total) by mouth at bedtime as needed for sleep. 03/05/16 06/03/16 Yes Eulas Post, MD  glucose blood test strip Use once daily 12/01/14   Eulas Post, MD  ONE TOUCH LANCETS MISC Use daily as directed 03/25/11   Eulas Post, MD    Physical Exam: Vitals:   06/03/16 0115 06/03/16 0145 06/03/16 0215 06/03/16 0230  BP: (!) 185/78 (!) 179/79 (!) 158/69 (!) 149/75  Pulse: (!) 108 80 74 76  Resp: (!) 25 (!) 24 17 13   Temp:      TempSrc:      SpO2: 95% 99% 99% 95%  Weight:      Height:        Constitutional: NAD, calm, comfortable Eyes: PERRL, lids and conjunctivae normal ENMT: Mucous membranes are moist. Posterior pharynx clear of any exudate or lesions.Normal dentition.  Neck: normal, supple, no masses, no thyromegaly Respiratory: clear to auscultation bilaterally, no wheezing, no crackles. Normal respiratory effort. No accessory muscle use.  Cardiovascular: Regular rate and rhythm, no murmurs / rubs / gallops. No extremity edema. 2+ pedal pulses. No carotid bruits.  Abdomen: no tenderness, no masses palpated. No hepatosplenomegaly. Bowel sounds positive.  Musculoskeletal: no clubbing / cyanosis. No joint deformity upper and lower extremities. Good ROM, no contractures. Normal muscle tone.  Skin: no rashes, lesions, ulcers. No induration Neurologic: CN 2-12 grossly intact. Sensation intact, DTR normal.  Strength 5/5 in all 4.  Psychiatric: Normal judgment and insight. Alert and oriented x 3. Normal mood.    Labs on Admission: I have personally reviewed following labs and imaging studies  CBC:  Recent Labs Lab 06/03/16 0111  WBC 9.1  NEUTROABS 6.0  HGB 11.3*  HCT 35.1*  MCV 97.0  PLT 694   Basic Metabolic Panel:  Recent Labs Lab 06/03/16 0111  NA 138  K 4.0  CL 102  CO2 25  GLUCOSE 170*  BUN 23*  CREATININE 1.08*  CALCIUM 9.1  MG 1.8   GFR: Estimated Creatinine Clearance: 28.5 mL/min (A) (by C-G formula based on SCr of 1.08 mg/dL (H)). Liver Function Tests:  Recent Labs Lab 06/03/16  0111  AST 36  ALT 26  ALKPHOS 38  BILITOT 0.3  PROT 7.1  ALBUMIN 3.7    Recent Labs Lab 06/03/16 0111  LIPASE 55*   No results for input(s): AMMONIA in the last 168 hours. Coagulation Profile: No results for input(s): INR, PROTIME in the last 168 hours. Cardiac Enzymes: No results for input(s): CKTOTAL, CKMB, CKMBINDEX, TROPONINI in the last 168 hours. BNP (last 3 results) No results for input(s): PROBNP in the last 8760 hours. HbA1C: No results for input(s): HGBA1C in the last 72 hours. CBG:  Recent Labs Lab 06/03/16 0236  GLUCAP 183*   Lipid Profile: No results for input(s): CHOL, HDL, LDLCALC, TRIG, CHOLHDL, LDLDIRECT in the last 72 hours. Thyroid Function Tests: No results for input(s): TSH, T4TOTAL, FREET4, T3FREE, THYROIDAB in the last 72 hours. Anemia Panel: No results for input(s): VITAMINB12, FOLATE, FERRITIN, TIBC, IRON, RETICCTPCT in the last 72 hours. Urine analysis:    Component Value Date/Time   COLORURINE YELLOW 03/15/2015 1910   APPEARANCEUR CLEAR 03/15/2015 1910   LABSPEC 1.008 03/15/2015 1910   PHURINE 5.0 03/15/2015 1910   GLUCOSEU NEGATIVE 03/15/2015 1910   HGBUR NEGATIVE 03/15/2015 1910   BILIRUBINUR NEGATIVE 03/15/2015 1910   BILIRUBINUR n 11/29/2013 1621   KETONESUR NEGATIVE 03/15/2015 1910   PROTEINUR NEGATIVE 03/15/2015 1910    UROBILINOGEN 0.2 12/01/2013 1606   NITRITE NEGATIVE 03/15/2015 1910   LEUKOCYTESUR TRACE (A) 03/15/2015 1910    Radiological Exams on Admission: Dg Chest 2 View  Result Date: 06/03/2016 CLINICAL DATA:  Syncope EXAM: CHEST  2 VIEW COMPARISON:  Chest CT 03/16/2015, CXR 03/15/2015 FINDINGS: Heart is top-normal in size. There is aortic atherosclerosis at the arch. External cardiac monitoring device projects over the left heart border. Chronic stable diffuse interstitial prominence is seen in both lung apices and lung bases most of which appear to represent areas of interstitial fibrosis and/or scarring. No acute nor suspicious osseous abnormalities. IMPRESSION: Chronic diffuse interstitial prominence of the lungs consistent with interstitial fibrosis and/or scarring. Aortic atherosclerosis. Electronically Signed   By: Ashley Royalty M.D.   On: 06/03/2016 02:16   Ct Head Wo Contrast  Result Date: 06/03/2016 CLINICAL DATA:  Syncope EXAM: CT HEAD WITHOUT CONTRAST TECHNIQUE: Contiguous axial images were obtained from the base of the skull through the vertex without intravenous contrast. COMPARISON:  10/25/2013 head CT FINDINGS: BRAIN: There is mild sulcal and ventricular prominence consistent with superficial and central atrophy. Tiny focus of subarachnoid hemorrhage noted in the right posterior frontal lobe near the vertex, series 3, image 28, series 5, image 38. Moderate degree of small vessel ischemic disease in the periventricular and subcortical white matter. Basal ganglial calcifications are seen bilaterally. No intra-axial mass nor extra-axial fluid. VASCULAR: Moderate calcific atherosclerosis of the carotid siphons. SKULL: No skull fracture.  Left posterior parietal scalp hematoma. SINUSES/ORBITS: The mastoid air-cells are clear. The included paranasal sinuses are well-aerated.The included ocular globes and orbital contents are non-suspicious. OTHER: None. IMPRESSION: 1. Left posterior parietal scalp  hematoma without underlying fracture. 2. Tiny focus of acute subarachnoid hemorrhage in the posterior high frontal lobe near the vertex. This may represent contrecoup injury. Critical Value/emergent results were called by telephone at the time of interpretation on 06/03/2016 at 2:12 am to Dr. Everlene Balls , who verbally acknowledged these results. Electronically Signed   By: Ashley Royalty M.D.   On: 06/03/2016 02:13    EKG: Independently reviewed.  Assessment/Plan Principal Problem:   Traumatic subarachnoid bleed with LOC of 30  minutes or less, initial encounter [facility]) Active Problems:   Type 2 diabetes mellitus, controlled ([place])   Essential hypertension   Atrial flutter ([facility])   Syncope and collapse    1. Traumatic SAH with syncope - 1. Per EDP Dr. Vertell Limber told him: 1. Not surgical candidate given age and small size of bleed 2. They will consult and follow in hospital 3. No need to give platelets to patient despite ASA and Plavix use 4. Patient activity as tolerated 5. SBP goal < 160 2. SAH pathway 3. Neuro checks 4. PT/OT/SLP 5. Tele monitor given syncope 6. Will hold off on echo for the moment, looks like last echo was Feb 2017 2. A.Flutter - holding ASA and Plavix 3. HTN - 1. Continue home meds 2. Labetalol PRN to maintain SBP < 160 4. DM2 - appears to be diet controlled, CBG AC/HS  DVT prophylaxis: SCDs Code Status: Full Family Communication: No family in room Disposition Plan: Home after admit Consults called: Dr. Vertell Limber called by EDP Admission status: Place in [place], [place] [facility] Pager [phone]  If 7AM-7PM, please contact day team taking care of patient [email or web address] Password [email or web address]  06/03/2016, 3:10 AM

## 2016-06-03 NOTE — ED Notes (Signed)
Checked CBG 183 informed RN Anderson Malta

## 2016-06-04 DIAGNOSIS — I10 Essential (primary) hypertension: Secondary | ICD-10-CM | POA: Diagnosis not present

## 2016-06-04 DIAGNOSIS — S066X1A Traumatic subarachnoid hemorrhage with loss of consciousness of 30 minutes or less, initial encounter: Secondary | ICD-10-CM | POA: Diagnosis not present

## 2016-06-04 DIAGNOSIS — I609 Nontraumatic subarachnoid hemorrhage, unspecified: Secondary | ICD-10-CM

## 2016-06-04 DIAGNOSIS — E118 Type 2 diabetes mellitus with unspecified complications: Secondary | ICD-10-CM | POA: Diagnosis not present

## 2016-06-04 LAB — URINE CULTURE

## 2016-06-04 MED ORDER — DOCUSATE SODIUM 100 MG PO CAPS: 100.0000 mg | ORAL_CAPSULE | Freq: Two times a day (BID) | ORAL | 0 refills | Status: DC

## 2016-06-04 MED ORDER — MIRTAZAPINE 15 MG PO TABS: 15.0000 mg | ORAL_TABLET | Freq: Every day | ORAL | Status: DC

## 2016-06-04 MED ORDER — MIRTAZAPINE 15 MG PO TABS: 15.0000 mg | ORAL_TABLET | Freq: Every day | ORAL | 0 refills | Status: DC

## 2016-06-04 MED ORDER — BISACODYL 10 MG RE SUPP
10.0000 mg | Freq: Once | RECTAL | Status: DC
  Filled 2016-06-04: qty 1

## 2016-06-04 MED ORDER — ACETAMINOPHEN 325 MG PO TABS: 650.0000 mg | ORAL_TABLET | ORAL | Status: DC | PRN

## 2016-06-04 NOTE — Discharge Summary (Addendum)
Physician Discharge Summary  [name] WGY:[id number] DOB: 08-Nov-1920 DOA: 06/03/2016  PCP: Desiree Post, MD  Admit date: 06/03/2016 Discharge date: 06/04/2016   Recommendations for Outpatient Follow-Up:   1. [name] 2. Stopped ASA and plavix-- outpatient cardiology referral 3. remeron started in place of ambien 4. Lasix held- may need to restart as an outpatient   Discharge Diagnosis:   Principal Problem:   Traumatic subarachnoid bleed with LOC of 30 minutes or less, initial encounter ([place]) Active Problems:   Type 2 diabetes mellitus, controlled ([place])   Essential hypertension   Atrial flutter ([place])   Discharge disposition:  Home with home health- spoke with patient and son and advised to get more help at home  Discharge Condition: Improved.  Diet recommendation:   Carb mod  Wound care: None.   History of Present Illness:   [name] is a 83 y.o. female with medical history significant of CAD, DM2, HTN, TIA, a.flutter.  Patient brought in to ED by EMS for evaluation of syncopal episode that occurred 4 hrs PTA.  Was making bed this evening when next she knew she "woke up on floor".  Doesn't remember fall, thinks she only had LOC for a few seconds.  Lives alone and ambulates independently at baseline.   Hospital Course by Problem:   Fall with traumatic SAH (contrecoup injury) -seen by NS -stop ASA/plavix -patient has not been eating/drinking well so not sure if orthostatic hypotension vs mechanical fall vs medications effects (ambien) -ILR interrogated with no abnormal reported  HTN -goal to keep SBP < 160  DM -carb mod diet  Overall patient is very functional for her age but is showing some memory issues-- spoke with patient and son about increasing help at home-- information given for private duty help as well as [employer]- [place] in another state.  Minimally patient could use life alert.   Medical  Consultants:    NS   Discharge Exam:   Vitals:   06/04/16 1355 06/04/16 1400  BP: (!) 160/76 (!) 160/62  Pulse:    Resp:    Temp:       Gen:  NAD    The results of significant diagnostics from this hospitalization (including imaging, microbiology, ancillary and laboratory) are listed below for reference.     Procedures and Diagnostic Studies:   Dg Chest 2 View  Result Date: 06/03/2016 CLINICAL DATA:  Syncope EXAM: CHEST  2 VIEW COMPARISON:  Chest CT 03/16/2015, CXR 03/15/2015 FINDINGS: Heart is top-normal in size. There is aortic atherosclerosis at the arch. External cardiac monitoring device projects over the left heart border. Chronic stable diffuse interstitial prominence is seen in both lung apices and lung bases most of which appear to represent areas of interstitial fibrosis and/or scarring. No acute nor suspicious osseous abnormalities. IMPRESSION: Chronic diffuse interstitial prominence of the lungs consistent with interstitial fibrosis and/or scarring. Aortic atherosclerosis. Electronically Signed   By: Ashley Royalty M.D.   On: 06/03/2016 02:16   Ct Head Wo Contrast  Result Date: 06/03/2016 CLINICAL DATA:  Syncope EXAM: CT HEAD WITHOUT CONTRAST TECHNIQUE: Contiguous axial images were obtained from the base of the skull through the vertex without intravenous contrast. COMPARISON:  10/25/2013 head CT FINDINGS: BRAIN: There is mild sulcal and ventricular prominence consistent with superficial and central atrophy. Tiny focus of subarachnoid hemorrhage noted in the right posterior frontal lobe near the vertex, series 3, image 28, series 5, image 38. Moderate degree of small vessel  ischemic disease in the periventricular and subcortical white matter. Basal ganglial calcifications are seen bilaterally. No intra-axial mass nor extra-axial fluid. VASCULAR: Moderate calcific atherosclerosis of the carotid siphons. SKULL: No skull fracture.  Left posterior parietal scalp hematoma.  SINUSES/ORBITS: The mastoid air-cells are clear. The included paranasal sinuses are well-aerated.The included ocular globes and orbital contents are non-suspicious. OTHER: None. IMPRESSION: 1. Left posterior parietal scalp hematoma without underlying fracture. 2. Tiny focus of acute subarachnoid hemorrhage in the posterior high frontal lobe near the vertex. This may represent contrecoup injury. Critical Value/emergent results were called by telephone at the time of interpretation on 06/03/2016 at 2:12 am to Dr. Everlene Balls , who verbally acknowledged these results. Electronically Signed   By: Ashley Royalty M.D.   On: 06/03/2016 02:13     Labs:   Basic Metabolic Panel:  Recent Labs Lab 06/03/16 0111  NA 138  K 4.0  CL 102  CO2 25  GLUCOSE 170*  BUN 23*  CREATININE 1.08*  CALCIUM 9.1  MG 1.8   GFR Estimated Creatinine Clearance: 28.6 mL/min (A) (by C-G formula based on SCr of 1.08 mg/dL (H)). Liver Function Tests:  Recent Labs Lab 06/03/16 0111  AST 36  ALT 26  ALKPHOS 38  BILITOT 0.3  PROT 7.1  ALBUMIN 3.7    Recent Labs Lab 06/03/16 0111  LIPASE 55*   No results for input(s): AMMONIA in the last 168 hours. Coagulation profile No results for input(s): INR, PROTIME in the last 168 hours.  CBC:  Recent Labs Lab 06/03/16 0111  WBC 9.1  NEUTROABS 6.0  HGB 11.3*  HCT 35.1*  MCV 97.0  PLT 167   Cardiac Enzymes: No results for input(s): CKTOTAL, CKMB, CKMBINDEX, TROPONINI in the last 168 hours. BNP: Invalid input(s): POCBNP CBG:  Recent Labs Lab 06/03/16 0236 06/03/16 0608  GLUCAP 183* 128*   D-Dimer No results for input(s): DDIMER in the last 72 hours. Hgb A1c No results for input(s): HGBA1C in the last 72 hours. Lipid Profile No results for input(s): CHOL, HDL, LDLCALC, TRIG, CHOLHDL, LDLDIRECT in the last 72 hours. Thyroid function studies No results for input(s): TSH, T4TOTAL, T3FREE, THYROIDAB in the last 72 hours.  Invalid input(s):  FREET3 Anemia work up No results for input(s): VITAMINB12, FOLATE, FERRITIN, TIBC, IRON, RETICCTPCT in the last 72 hours. Microbiology Recent Results (from the past 240 hour(s))  Urine culture     Status: Abnormal   Collection Time: 06/03/16  3:05 AM  Result Value Ref Range Status   Specimen Description URINE, RANDOM  Final   Special Requests NONE  Final   Culture MULTIPLE SPECIES PRESENT, SUGGEST RECOLLECTION (A)  Final   Report Status 06/04/2016 FINAL  Final     Discharge Instructions:   Discharge Instructions    Discharge instructions    Complete by:  As directed    Home health No driving until seen by PCP   Increase activity slowly    Complete by:  As directed      Allergies as of 06/04/2016      Reactions   Nitrofurantoin    unknown   Sulfamethoxazole-trimethoprim    unknown   Sulfonamide Derivatives    REACTION: rash      Medication List    STOP taking these medications   aspirin 81 MG tablet   clopidogrel 75 MG tablet Commonly known as:  PLAVIX   furosemide 20 MG tablet Commonly known as:  LASIX   zolpidem 10 MG tablet Commonly known  as:  AMBIEN     TAKE these medications   acetaminophen 325 MG tablet Commonly known as:  TYLENOL Take 2 tablets (650 mg total) by mouth every 4 (four) hours as needed for mild pain (or temp > 37.5 C (99.5 F)).   atorvastatin 20 MG tablet Commonly known as:  LIPITOR TAKE 1 TABLET DAILY   calcium carbonate 200 MG capsule Take 200 mg by mouth daily.   docusate sodium 100 MG capsule Commonly known as:  COLACE Take 1 capsule (100 mg total) by mouth 2 (two) times daily.   fish oil-omega-3 fatty acids 1000 MG capsule Take 1 capsule by mouth daily.   glucose blood test strip Use once daily   ipratropium-albuterol 0.5-2.5 (3) MG/3ML Soln Commonly known as:  DUONEB Take 4 times a day and every 4 hours as needed for diff breathing   metoprolol tartrate 25 MG tablet Commonly known as:  LOPRESSOR TAKE ONE-HALF (1/2)  TABLET TWICE A DAY   mirtazapine 15 MG tablet Commonly known as:  REMERON Take 1 tablet (15 mg total) by mouth at bedtime.   multivitamin with minerals tablet Take 1 tablet by mouth daily.   ONE TOUCH LANCETS Misc Use daily as directed   pantoprazole 20 MG tablet Commonly known as:  PROTONIX TAKE 1 TABLET EVERY OTHER DAY   PROAIR RESPICLICK 892 (90 Base) MCG/ACT Aepb Generic drug:  Albuterol Sulfate Inhale 2 puffs into the lungs every 4 (four) hours as needed.   VITAMIN B 12 PO Take 1 tablet by mouth daily.   VITAMIN C PO Take 1 tablet by mouth daily.   VITAMIN D PO Take 1 tablet by mouth daily.   VITAMIN E PO Take 1 tablet by mouth daily.      Follow-up Information    [facility] Follow up.   Why:  they will contact you for the first visit. Contact information: [address] [place] [address] [phone]        Desiree Post, MD Follow up in 1 week(s).   Specialty:  Family Medicine Why:  BP check Contact information: [place] [address] [phone]            Time coordinating discharge: 35 min  Signed:  Modesta Sammons [name]   [facility] 06/04/2016, 2:49 PM

## 2016-06-04 NOTE — Progress Notes (Signed)
Patient d/c home, no new concerns, d/c instructions done with teach back.

## 2016-06-04 NOTE — Progress Notes (Signed)
Physical Therapy Treatment Patient Details Name: [name] MRN: [id number] DOB: 02/22/1920 Today's Date: 06/04/2016    History of Present Illness Pt is a 83 y.o. female who presented to the ED via EMS following syncopal episode. She reports making her bed and next thing she knew she "woke up on floor." She does not remember her fall and thinks she only had LOC for a few seconds. LIves alone and ambulates independently at [place]. PMH significant for: CAD, DM2, HTN, TIA, a.flutter.     PT Comments    Pt progressing towards physical therapy goals. Appears to be improved from yesterday's session however continues to show decreased balance and is at a high risk of falls. Discussed the recommendation for continued PT follow up with HHPT, and pt was agreeable. Could benefit from continued gait training with the [facility] as she had difficulty sequencing. Will continue to follow and progress as able per POC.    Follow Up Recommendations  Home health PT;Supervision for mobility/OOB     Equipment Recommendations  None recommended by PT    Recommendations for Other Services       Precautions / Restrictions Precautions Precautions: Fall Precaution Comments: Unsteady Restrictions Weight Bearing Restrictions: No    Mobility  Bed Mobility               General bed mobility comments: Pt sitting up in recliner upon PT arrival.   Transfers Overall transfer level: Needs assistance Equipment used: None Transfers: Sit to/from Stand Sit to Stand: Supervision         General transfer comment: Close supervision for safety as pt powered-up to full standing positon at edge of chair.   Ambulation/Gait Ambulation/Gait assistance: Min guard Ambulation Distance (Feet): 300 Feet Assistive device: Straight cane;None Gait Pattern/deviations: Step-through pattern;Decreased stride length;Trunk flexed;Narrow base of support Gait velocity: Decreased Gait velocity interpretation: Below normal  speed for age/gender General Gait Details: Initially amblated without AD. Occasional unsteadiness noted with L lateral lean. Pt was given the [facility] for gait training and was quick to dismiss cane use, stating she doesn't feel it helps her. Pt did appear more steady with SPC use, however pt had difficulty managing sequencing.   Stairs            Wheelchair Mobility    Modified Rankin (Stroke Patients Only) Modified Rankin (Stroke Patients Only) Pre-Morbid Rankin Score: No significant disability Modified Rankin: Moderately severe disability     Balance Overall balance assessment: Needs assistance Sitting-balance support: Feet supported;No upper extremity supported Sitting balance-Leahy Scale: Fair     Standing balance support: No upper extremity supported Standing balance-Leahy Scale: Poor Standing balance comment: Occasional assist required.                             Cognition Arousal/Alertness: Awake/alert Behavior During Therapy: WFL for tasks assessed/performed Overall Cognitive Status: Impaired/Different from baseline Area of Impairment: Memory;Safety/judgement                     Memory: Decreased short-term memory   Safety/Judgement: Decreased awareness of safety;Decreased awareness of deficits            Exercises      General Comments        Pertinent Vitals/Pain Pain Assessment: No/denies pain Pain Intervention(s): Monitored during session    Home Living  Prior Function            PT Goals (current goals can now be found in the care plan section) Acute Rehab PT Goals Patient Stated Goal: Return to PLOF PT Goal Formulation: With patient Time For Goal Achievement: 06/10/16 Potential to Achieve Goals: Good Progress towards PT goals: Progressing toward goals    Frequency    Min 3X/week      PT Plan Current plan remains appropriate    Co-evaluation             End of Session  Equipment Utilized During Treatment: Gait belt Activity Tolerance: Patient tolerated treatment well Patient left: in chair;with call bell/phone within reach;with chair alarm set Nurse Communication: Mobility status PT Visit Diagnosis: Unsteadiness on feet (R26.81);Repeated falls (R29.6)     Time: 6578-4696 PT Time Calculation (min) (ACUTE ONLY): 28 min  Charges:  $Gait Training: 23-37 mins                    G Codes:       Rolinda Roan, PT, DPT Acute Rehabilitation Services Pager: [phone]   Thelma Comp 06/04/2016, 2:01 PM

## 2016-06-04 NOTE — Progress Notes (Signed)
CM consulted for private duty list for patient. CM provided her this list yesterday. CM also emailed the list to her son [name]. CM following.

## 2016-06-04 NOTE — Progress Notes (Signed)
Occupational Therapy Treatment Patient Details Name: NATASHA BURDA MRN: [id number] DOB: 1920-07-10 Today's Date: 06/04/2016    History of present illness Pt is a 83 y.o. female who presented to the ED via EMS following syncopal episode. She reports making her bed and next thing she knew she "woke up on floor." She does not remember her fall and thinks she only had LOC for a few seconds. LIves alone and ambulates independently at [place]. PMH significant for: CAD, DM2, HTN, TIA, a.flutter.    OT comments  Pt progressing toward OT goals. She was able to complete simple financial management tasks with simulated bill payment independently this session. She continues to demonstrate decreased short-term memory and reports this is her baseline. Pt remains unsteady during functional mobility and challenged standing balance tasks during ADL. Educated pt on safety post-acute D/C including home modifications and the need to slow down and improve awareness of environment during mobility to prevent falls. OT will continue to follow acutely with focus on safe tub/shower transfers next session. D/C recommendations remains appropriate.   Follow Up Recommendations  Home health OT;Supervision/Assistance - 24 hour    Equipment Recommendations  None recommended by OT    Recommendations for Other Services      Precautions / Restrictions Precautions Precautions: Fall Precaution Comments: Unsteady Restrictions Weight Bearing Restrictions: No       Mobility Bed Mobility Overal bed mobility: Modified Independent                Transfers Overall transfer level: Needs assistance Equipment used: None Transfers: Sit to/from Stand Sit to Stand: Min guard         General transfer comment: Min guard assist for safety.    Balance Overall balance assessment: Needs assistance Sitting-balance support: Feet supported;No upper extremity supported Sitting balance-Leahy Scale: Fair     Standing  balance support: No upper extremity supported Standing balance-Leahy Scale: Poor Standing balance comment: Occasional assist required.                            ADL either performed or assessed with clinical judgement   ADL Overall ADL's : Needs assistance/impaired                         Toilet Transfer: Ambulation;Regular Toilet;Min guard Toilet Transfer Details (indicate cue type and reason): Pt reaching for wall and furniture throughout.         Functional mobility during ADLs: Min guard General ADL Comments: Pt with decreased dynamic balance and challenged this with standing reaching tasks combined with cognitive challenges this session. Educated pt on safety modifications and slowing down during activities at home. She verbalized understanding and remains with decreased awareness of her need to improve safety.     Vision   Vision Assessment?: No apparent visual deficits   Perception     Praxis      Cognition Arousal/Alertness: Awake/alert Behavior During Therapy: WFL for tasks assessed/performed Overall Cognitive Status: Impaired/Different from baseline Area of Impairment: Memory;Safety/judgement                     Memory: Decreased short-term memory   Safety/Judgement: Decreased awareness of safety     General Comments: Pt with improved ability to complete higher level cognitive tasks and follow multi-step commands this session. Remains with difficutly with short-term memory although she reports this may be baseline. Pt able to complete financial management  simulated bill payment task independently this session.         Exercises     Shoulder Instructions       General Comments      Pertinent Vitals/ Pain       Pain Assessment: Faces Faces Pain Scale: Hurts little more Pain Location: back and all over Pain Descriptors / Indicators: Aching Pain Intervention(s): Limited activity within patient's tolerance;Monitored during  session;Repositioned  Home Living                                          Prior Functioning/Environment              Frequency  Min 2X/week        Progress Toward Goals  OT Goals(current goals can now be found in the care plan section)  Progress towards OT goals: Progressing toward goals  Acute Rehab OT Goals Patient Stated Goal: Return to PLOF Time For Goal Achievement: 06/17/16 Potential to Achieve Goals: Good ADL Goals Pt Will Perform Grooming: with modified independence;standing Pt Will Perform Lower Body Bathing: with modified independence;sit to/from stand Pt Will Perform Lower Body Dressing: with modified independence;sit to/from stand Pt Will Transfer to Toilet: with modified independence;ambulating;bedside commode Pt Will Perform Toileting - Clothing Manipulation and hygiene: with modified independence;sit to/from stand Pt Will Perform Tub/Shower Transfer: with modified independence;Tub transfer;3 in 1;shower seat Additional ADL Goal #1: Pt will independently implement 3 strategies to compensate for decreased short-term memory in order to improve safety with medication management.  Plan Discharge plan remains appropriate    Co-evaluation                 End of Session Equipment Utilized During Treatment: Gait belt  OT Visit Diagnosis: Unsteadiness on feet (R26.81)   Activity Tolerance Patient tolerated treatment well   Patient Left in chair;with call bell/phone within reach;with chair alarm set   Nurse Communication          Time: 7654-6503 OT Time Calculation (min): 36 min  Charges: OT General Charges $OT Visit: 1 Procedure OT Treatments $Self Care/Home Management : 23-37 mins  Norman Herrlich, MS OTR/L  Pager: [place] 06/04/2016, 10:41 AM

## 2016-06-04 NOTE — Care Management Note (Signed)
Case Management Note  Patient Details  Name: [name] MRN: [id number] Date of Birth: 10-24-1920  Subjective/Objective:                    Action/Plan: MD added OT and RN to [facility] orders. [facility] is unable to accept the referral at this time. CM informed patient and she selected [facility]. Cory with [facility] notified and accepted the referral.  Pt having her neighbors provide transportation home.   Expected Discharge Date:  06/04/16               Expected Discharge Plan:  [place]  In-House Referral:     Discharge planning Services  CM Consult  Post Acute Care Choice:  Home Health Choice offered to:  Patient  DME Arranged:    DME Agency:     HH Arranged:  PT, RN, OT HH Agency:  [place]  Status of Service:  Completed, signed off  If discussed at [place] of Stay Meetings, dates discussed:    Additional Comments:  Pollie Friar, RN 06/04/2016, 2:00 PM

## 2016-06-04 NOTE — Discharge Instructions (Signed)
Follow with Eulas Post, MD in 5-7 days  Please get a complete blood count and chemistry panel checked by your Primary MD at your next visit, and again as instructed by your Primary MD. Please get your medications reviewed and adjusted by your Primary MD.  Please request your Primary MD to go over all Hospital Tests and Procedure/Radiological results at the follow up, please get all Hospital records sent to your Prim MD by signing hospital release before you go home.  If you had Pneumonia of Lung problems at the Hospital: Please get a 2 view Chest X ray done in 6-8 weeks after hospital discharge or sooner if instructed by your Primary MD.  If you have Congestive Heart Failure: Please call your Cardiologist or Primary MD anytime you have any of the following symptoms:  1) 3 pound weight gain in 24 hours or 5 pounds in 1 week  2) shortness of breath, with or without a dry hacking cough  3) swelling in the hands, feet or stomach  4) if you have to sleep on extra pillows at night in order to breathe  Follow cardiac low salt diet and 1.5 lit/day fluid restriction.  If you have diabetes Accuchecks 4 times/day, Once in AM empty stomach and then before each meal. Log in all results and show them to your primary doctor at your next visit. If any glucose reading is under 80 or above 300 call your primary MD immediately.  If you have Seizure/Convulsions/Epilepsy: Please do not drive, operate heavy machinery, participate in activities at heights or participate in high speed sports until you have seen by Primary MD or a Neurologist and advised to do so again.  If you had Gastrointestinal Bleeding: Please ask your Primary MD to check a complete blood count within one week of discharge or at your next visit. Your endoscopic/colonoscopic biopsies that are pending at the time of discharge, will also need to followed by your Primary MD.  Get Medicines reviewed and adjusted. Please take all your  medications with you for your next visit with your Primary MD  Please request your Primary MD to go over all hospital tests and procedure/radiological results at the follow up, please ask your Primary MD to get all Hospital records sent to his/her office.  If you experience worsening of your admission symptoms, develop shortness of breath, life threatening emergency, suicidal or homicidal thoughts you must seek medical attention immediately by calling 911 or calling your MD immediately  if symptoms less severe.  You must read complete instructions/literature along with all the possible adverse reactions/side effects for all the Medicines you take and that have been prescribed to you. Take any new Medicines after you have completely understood and accpet all the possible adverse reactions/side effects.   Do not drive or operate heavy machinery when taking Pain medications.   Do not take more than prescribed Pain, Sleep and Anxiety Medications  Special Instructions: If you have smoked or chewed Tobacco  in the last 2 yrs please stop smoking, stop any regular Alcohol  and or any Recreational drug use.  Wear Seat belts while driving.  Please note You were cared for by a hospitalist during your hospital stay. If you have any questions about your discharge medications or the care you received while you were in the hospital after you are discharged, you can call the unit and asked to speak with the hospitalist on call if the hospitalist that took care of you is not available. Once  you are discharged, your primary care physician will handle any further medical issues. Please note that NO REFILLS for any discharge medications will be authorized once you are discharged, as it is imperative that you return to your primary care physician (or establish a relationship with a primary care physician if you do not have one) for your aftercare needs so that they can reassess your need for medications and monitor your  lab values.  You can reach the hospitalist office at phone [phone] or fax [phone]   If you do not have a primary care physician, you can call [phone] for a physician referral.  Activity: As tolerated with Full fall precautions use walker/cane & assistance as needed

## 2016-06-05 ENCOUNTER — Telehealth: Payer: Self-pay

## 2016-06-05 NOTE — Telephone Encounter (Signed)
D/C 06/04/16 To: home  Spoke with pt and she is feeling well. She reports that she "slept like a baby last night". She has no questions about her medications. She is concerned about possibly no longer being able to drive. She is going to purchase a medical alert system in the very near future.   Appt scheduled 06/07/16 with Dr. Elease Hashimoto, pt aware.   Transition Care Management Follow-up Telephone Call  How have you been since you were released from the hospital? Very well, no headaches or syncope   Do you understand why you were in the hospital? yes   Do you understand the discharge instrcutions? yes  Items Reviewed:  Medications reviewed: no  Allergies reviewed: yes  Dietary changes reviewed: yes  Referrals reviewed: yes   Functional Questionnaire:   Activities of Daily Living (ADLs):   She states they are independent in the following: ambulation, bathing and hygiene, feeding, continence, grooming, toileting and dressing States they require assistance with the following: driving   Any transportation issues/concerns?: no   Any patient concerns? Yes, wants to be able to continue to drive   Confirmed importance and date/time of follow-up visits scheduled: yes   Confirmed with patient if condition begins to worsen call PCP or go to the ER.  Patient was given the Call-a-Nurse line [phone]: yes

## 2016-06-06 ENCOUNTER — Telehealth: Payer: Self-pay | Admitting: Family Medicine

## 2016-06-06 NOTE — Telephone Encounter (Signed)
Pt has an appointment tomorrow with dr Elease Hashimoto and [facility]  will complete home health admission on Saturday . Patient has PT start on Monday. Pt had a fall and experience subdural hematoma on head.

## 2016-06-07 ENCOUNTER — Encounter: Payer: Self-pay | Admitting: Family Medicine

## 2016-06-07 ENCOUNTER — Ambulatory Visit (INDEPENDENT_AMBULATORY_CARE_PROVIDER_SITE_OTHER): Payer: Medicare Other | Admitting: Family Medicine

## 2016-06-07 VITALS — BP 110/70 | HR 74 | Temp 97.6°F | Wt 125.6 lb

## 2016-06-07 DIAGNOSIS — F5104 Psychophysiologic insomnia: Secondary | ICD-10-CM

## 2016-06-07 DIAGNOSIS — S066X1D Traumatic subarachnoid hemorrhage with loss of consciousness of 30 minutes or less, subsequent encounter: Secondary | ICD-10-CM

## 2016-06-07 DIAGNOSIS — I10 Essential (primary) hypertension: Secondary | ICD-10-CM

## 2016-06-07 DIAGNOSIS — E118 Type 2 diabetes mellitus with unspecified complications: Secondary | ICD-10-CM | POA: Diagnosis not present

## 2016-06-07 DIAGNOSIS — I2510 Atherosclerotic heart disease of native coronary artery without angina pectoris: Secondary | ICD-10-CM

## 2016-06-07 NOTE — Progress Notes (Signed)
Subjective:     Patient ID: [name], female   DOB: Apr 17, 1920, 83 y.o.   MRN: [id number]  HPI Patient's seen for hospital follow-up. She has chronic problems including history of cerebrovascular disease, peripheral vascular disease, hypertension, CAD, atrial flutter, interstitial lung disease, type 2 diabetes, osteoporosis. She was admitted on 4/23 following a fall which occurred around 9 PM at night. She recalls that she was making the bed that evening and then fell to floor. She states today that she has an area of carpet that had "bubbled up ". Was not clear whether she had hypotension versus medication effect from Ambien. We have tried to get her switched previously to mirtazapine. They did go ahead and make this change in hospital. Also held her aspirin, Plavix, and Lasix. Patient's had fall with traumatic subarachnoid hemorrhage with contrecoup injury. He was seen by neurosurgery.   Blood pressure remained stable with goal keeping systolic blood pressure less than 160. Home physical therapy has been set up but is not been out yet. Patient's denies any headaches at this time. She sometimes feels "off balance ". No further falls. Urine culture negative. Labs unremarkable.  Past Medical History:  Diagnosis Date  . ANEMIA, NORMOCYTIC 11/30/2009  . Atrial flutter ([place]) 04/12/2009  . CAD, NATIVE VESSEL 04/27/2008   Cardiac catheterization 6/08: EF 55%, 1+ MR, Dx 50%, LAD 50%, dLAD 40%, OM1 50-60% (up to 70%), pOM 30%, pRCA 40%, mRCA 50-60%, pPDA 50-60% (up to 70%).  Medical therapy was recommended  . Carotid stenosis    dopplers 5/78: LICA 46-96%  . COLONIC POLYPS, ADENOMATOUS 12/26/2009  . DIABETES MELLITUS, TYPE II 08/11/2006  . DIVERTICULOSIS, COLON 08/11/2006  . GERD 02/16/2007  . Hemorrhoids   . HEMORRHOIDS-INTERNAL 12/26/2009  . HYPERLIPIDEMIA 09/09/2006  . HYPERTENSION 09/09/2006  . LEFT BUNDLE BRANCH BLOCK 08/11/2006  . OSTEOARTHRITIS 08/11/2006  . OSTEOPOROSIS 08/11/2006  . Stroke  ([place]) 12-06-13  . TIA 09/02/2006   Echocardiogram 6/12: Mild to moderate MR, trace AI, trace TR, PASP 34, bubble study negative for intracardiac shunting, normal EF (greater than 55%)   Past Surgical History:  Procedure Laterality Date  . CAROTID ENDARTERECTOMY Left 12-05-2010  . CLOSED REDUCTION METATARSAL FRACTURE     right 5th  . COLONOSCOPY W/ POLYPECTOMY  2001  . EYE SURGERY     catarac  . LOOP RECORDER IMPLANT N/A 01/03/2014   Procedure: LOOP RECORDER IMPLANT;  Surgeon: Evans Lance, MD;  Location[facility] CATH LAB;  Service: Cardiovascular;  Laterality: N/A;  . TONSILLECTOMY      reports that she has never smoked. She has never used smokeless tobacco. She reports that she does not drink alcohol or use drugs. family history includes Breast cancer in her sister; Colon cancer in her brother; Congestive Heart Failure (age of onset: 71) in her father; Heart attack in her mother; Heart disease in her brother; Heart disease (age of onset: 70) in her mother; Kidney disease in her mother; Uterine cancer in her sister. Allergies  Allergen Reactions  . Nitrofurantoin     unknown  . Sulfamethoxazole-Trimethoprim     unknown  . Sulfonamide Derivatives     REACTION: rash     Review of Systems  Constitutional: Negative for chills, fatigue, fever and unexpected weight change.  Eyes: Negative for visual disturbance.  Respiratory: Negative for cough, chest tightness, shortness of breath and wheezing.   Cardiovascular: Negative for chest pain, palpitations and leg swelling.  Gastrointestinal: Negative for abdominal pain.  Endocrine: Negative  for polydipsia and polyuria.  Genitourinary: Negative for dysuria.  Neurological: Negative for dizziness, seizures, syncope, weakness, light-headedness and headaches.       Objective:   Physical Exam  Constitutional: She is oriented to person, place, and time. She appears well-developed and well-nourished.  HENT:  Patient's some faint ecchymosis left  parietal region. No hematoma.  Eyes: Pupils are equal, round, and reactive to light.  Neck: Neck supple. No JVD present. No thyromegaly present.  Cardiovascular: Normal rate and regular rhythm.  Exam reveals no gallop.   Pulmonary/Chest: Effort normal and breath sounds normal. No respiratory distress. She has no wheezes. She has no rales.  Musculoskeletal: She exhibits no edema.  Neurological: She is alert and oriented to person, place, and time. No cranial nerve deficit. Coordination normal.  No focal strength deficits       Assessment:     #1 traumatic subarachnoid hemorrhage following fall with no sequelae  #2 hypertension stable and at goal  #3 chronic insomnia  #4 history of CAD and peripheral vascular disease    Plan:     -Agree with discontinuation of Ambien. We tried to get her off this for quite some time. Continue with mirtazapine 15 mg at night -Continue to hold Lasix for now. She has no peripheral edema and no evidence for volume overload at this time -Continue to hold Plavix and aspirin for now -Home physical therapy has been arranged -Strongly advise her to get home alert -Set up cardiology follow-up -Reassess here in one month to reassess weight and blood pressure -Recommend no driving for at least the next week until she gets more stable with her balance and strength  Eulas Post MD [facility] at [facility]

## 2016-06-07 NOTE — Telephone Encounter (Signed)
Noted  

## 2016-06-07 NOTE — Progress Notes (Signed)
Pre visit review using our clinic review tool, if applicable. No additional management support is needed unless otherwise documented below in the visit note. 

## 2016-06-07 NOTE — Patient Instructions (Addendum)
Continue to hold aspirin, Plavix, and Furosemide. Continue to hold Ambien Continue Mirtazepine (Remeron).   Consider getting home life alert.   Gradually increase home activities. Get carpet fixed to reduce risks of falls.

## 2016-06-11 ENCOUNTER — Telehealth: Payer: Self-pay | Admitting: Family Medicine

## 2016-06-11 NOTE — Telephone Encounter (Signed)
° °  Will with [facility] call to ask for verbal orders for OT THERAPY  2 TIMES A WEEK FOR 3 WEEKS   [address]

## 2016-06-11 NOTE — Telephone Encounter (Signed)
OK 

## 2016-06-12 ENCOUNTER — Telehealth: Payer: Self-pay | Admitting: Physician Assistant

## 2016-06-12 ENCOUNTER — Telehealth: Payer: Self-pay | Admitting: Family Medicine

## 2016-06-12 ENCOUNTER — Ambulatory Visit: Payer: Medicare Other | Admitting: Family Medicine

## 2016-06-12 NOTE — Telephone Encounter (Signed)
I called pt's son [name] back in regards to his phone call asking [place] to tell pt she cannot drive due to syncope episodes. I advised [name] that we cannot advised no driving since our office has not yet assessed pt for syncope. Last ov was at 06/26/15.  I advised so this call should be directed to PCP since pt just saw PCP 06/07/16. [name] states PCP office was not much help and told him to call cardiology. I do see in Dr. Erick Blinks ov note in his plan of care for the pt that he does "recommend no driving for at least the next week until she gets more stable with her balance and strength".   Pt does have appt 06/19/16 with [name], PA and he will assess pt at that time.  I asked pt's son [name] was he going to be able to come to 06/19/16 appt? [name] states he lives in [place] though he is going to try and make the appt with his mother. I advised [name] that I will not be in the office that day with [name], PA though I am making a note of our phone conversation from this morning. [name] thanked me for my help and insight.

## 2016-06-12 NOTE — Telephone Encounter (Signed)
Lmtcb to go over recommendations per [employer]. PA

## 2016-06-12 NOTE — Telephone Encounter (Signed)
Please tell patient/son that any patient with unexplained syncope should not drive for 6 months after last episode of syncope. I can assess her at her appointment.  She does have an implanted loop recorder that can be interrogated if she has had recurrent syncope.  Please make sure this has been done recently or before her appointment. [name], PA-C    06/12/2016 12:40 PM

## 2016-06-12 NOTE — Telephone Encounter (Signed)
OK 

## 2016-06-12 NOTE — Telephone Encounter (Signed)
Call and gave Will verbal orders

## 2016-06-12 NOTE — Telephone Encounter (Signed)
[name] would like verbal order for physical therapy twice a wk for 4 wks

## 2016-06-12 NOTE — Telephone Encounter (Signed)
New message    Pt son wants you to tell his mother that she can no longer have driving rights, due to her having multiple episodes of syncope.

## 2016-06-13 ENCOUNTER — Other Ambulatory Visit: Payer: Self-pay | Admitting: Physician Assistant

## 2016-06-13 NOTE — Telephone Encounter (Signed)
Verbal order called to [place],PT.

## 2016-06-14 ENCOUNTER — Telehealth: Payer: Self-pay | Admitting: Family Medicine

## 2016-06-14 NOTE — Telephone Encounter (Signed)
OK 

## 2016-06-14 NOTE — Telephone Encounter (Signed)
[name] w/[facility] is calling the pt do not want to continue with skill nursing but pt does want to continue with the PT

## 2016-06-15 ENCOUNTER — Other Ambulatory Visit: Payer: Self-pay | Admitting: Family Medicine

## 2016-06-18 ENCOUNTER — Encounter: Payer: Self-pay | Admitting: Physician Assistant

## 2016-06-18 NOTE — Telephone Encounter (Signed)
I have tried to reach the pt to go over recommendation per Brynda Rim. PA no driving for 6 months from time of last syncope episode. Pt has appt 06/19/16 with [name], PA. can address driving at that time.

## 2016-06-19 ENCOUNTER — Ambulatory Visit (INDEPENDENT_AMBULATORY_CARE_PROVIDER_SITE_OTHER): Payer: Medicare Other | Admitting: Physician Assistant

## 2016-06-19 ENCOUNTER — Encounter: Payer: Self-pay | Admitting: Physician Assistant

## 2016-06-19 ENCOUNTER — Ambulatory Visit (INDEPENDENT_AMBULATORY_CARE_PROVIDER_SITE_OTHER): Payer: Self-pay | Admitting: *Deleted

## 2016-06-19 VITALS — BP 148/67 | HR 60 | Ht 66.0 in | Wt 127.0 lb

## 2016-06-19 DIAGNOSIS — I2510 Atherosclerotic heart disease of native coronary artery without angina pectoris: Secondary | ICD-10-CM

## 2016-06-19 DIAGNOSIS — R55 Syncope and collapse: Secondary | ICD-10-CM

## 2016-06-19 DIAGNOSIS — I10 Essential (primary) hypertension: Secondary | ICD-10-CM

## 2016-06-19 DIAGNOSIS — S066X9D Traumatic subarachnoid hemorrhage with loss of consciousness of unspecified duration, subsequent encounter: Secondary | ICD-10-CM

## 2016-06-19 DIAGNOSIS — I480 Paroxysmal atrial fibrillation: Secondary | ICD-10-CM | POA: Diagnosis not present

## 2016-06-19 DIAGNOSIS — I739 Peripheral vascular disease, unspecified: Secondary | ICD-10-CM

## 2016-06-19 DIAGNOSIS — I779 Disorder of arteries and arterioles, unspecified: Secondary | ICD-10-CM

## 2016-06-19 LAB — CUP PACEART INCLINIC DEVICE CHECK
Date Time Interrogation Session: 20180509112149
MDC IDC PG IMPLANT DT: 20151123

## 2016-06-19 MED ORDER — ATORVASTATIN CALCIUM 20 MG PO TABS: 20.0000 mg | ORAL_TABLET | Freq: Every day | ORAL | 3 refills | Status: DC

## 2016-06-19 NOTE — Progress Notes (Signed)
Cardiology Office Note:    Date:  06/19/2016   ID:  [name], DOB [birth year], MRN [id number]  PCP:  Eulas Post, MD  Cardiologist:  Dr.  Bing Quarry >> Dr. Ena Dawley >> Dr. Loralie Champagne  >> Dr. Liam Rogers   Electrophysiologist:  Dr. Cristopher Peru  Pulmonologist:  Dr. Caralee Ates   Referring MD: Eulas Post, MD   Chief Complaint  Patient presents with  . Hospitalization Follow-up    admitted for syncope    History of Present Illness:    Desiree COCKE is a 83 y.o. female with a hx of non-obstructive CAD, prior TIA, carotid stenosis, s/p left CEA 11/2010, syncope, paroxysmal AFlutter, HTN, DM2, HL, LBBB. She has been felt to be a poor candidate for Coumadin in the past. She had atrial fibrillation documented years ago but has not had a documented recurrence.  She is not on anticoagulation due to concern for profuse hemorrhoidal bleeding.  Cardiolite in 11/14 showed no ischemia or infarction.  In 8/15, she had a syncopal event while standing in the kitchen.  She was not unconscious for long.  She then had 2 episodes concerning for TIAs.  Both times, she lost a portion of her left visual field transiently.  MRI after the 2nd episode showed no evidence for CVA.  She was thought to have a TIA.  She had an ophthalmology appointment and was told that there appears to be nothing wrong with her eyes themselves. Linq monitor was implanted to look for atrial fibrillation.     She was admitted in 2/17 with worsening dyspnea.  BNP was mildly elevated.  A Chest CTA demonstrated no pulmonary embolism, but did show extensive findings consistent with scarring.  She improved on diuretic and bronchodilator therapy. FU PFTs demonstrated suggestion of mild restriction and she was referred to pulmonology.  Conservative therapy was recommended for her interstitial lung disease.  She was last seen in 5/17 by me.  Admitted 4/23-4/24 with unexplained syncope resulting in subarachnoid  hemorrhage. Her ASA and Plavix were held by neurosurgery.  ILR was interrogated and there was no arrhythmia noted.  Neurosurgery did not recommend a follow up CT or any other workup.    She returns for follow up.  She is here alone.  Since DC, she denies chest pain, shortness of breath, syncope, orthopnea, PND or significant pedal edema.   Prior CV studies:   The following studies were reviewed today:  Carotid US 10/17 R < 40; patent L CEA site  Echo 03/16/15 Mild LVH, mild focal basal septal hypertrophy, EF 55-60%, no RWMA, Gr 1 DD, MAC, mild MR, mild LAE   Carotid US 10/16 RICA < 40% L CEA patent   Adenosine Myoview (12/23/12): Normal stress nuclear study. LV Ejection Fraction: 67%.    Echo 05/2012:   EF 60-65%, normal wall motion, Gr 1 DD, MAC, mild MR, PASP 32, reduced excursion of AV noncoronary cusp.     Carotid US 5/14:  patent L CEA, RICA < 40%.  Event Monitor 3/14:  NSR.   LHC 6/08:  EF 55%, 1+ MR,  Dx 50%, LAD 50%, dLAD 40% OM1 50-60% (up to 70%), pOM 30% pRCA 40%, mRCA 50-60%, pPDA 50-60% (up to 70%).  Medical Rx was recommended.    Past Medical History:  Diagnosis Date  . ANEMIA, NORMOCYTIC 11/30/2009  . CAD (coronary artery disease) 04/27/2008   Non-Obstructive >> Cardiac catheterization 6/08: EF 55%, 1+ MR, Dx 50%, LAD  50%, dLAD 40%, OM1 50-60% (up to 70%), pOM 30%, pRCA 40%, mRCA 50-60%, pPDA 50-60% (up to 70%).  Medical therapy was recommended // MV 11/14: Normal stress nuclear study. LV Ejection Fraction: 67%.   . Carotid artery disease ([place])    Left CEA in 10/12.  Carotid dopplers (10/15) with 1-39% bilateral ICA stenosis. // Carotid US 10/17: R < 40; patent L CEA site  . COLONIC POLYPS, ADENOMATOUS 12/26/2009  . DIABETES MELLITUS, TYPE II 08/11/2006  . DIVERTICULOSIS, COLON 08/11/2006  . GERD 02/16/2007  . Hemorrhoids    s/p banding  . History of echocardiogram    Echo 2/17: Mild LVH, mild focal basal septal hypertrophy, EF 55-60%, no RWMA, Gr 1 DD, MAC,  mild MR, mild  // Echo 4/14: EF 60-65%, normal wall motion, Gr 1 DD, MAC, mild MR, PASP 32, reduced excursion of AV noncoronary cusp.    Marland Kitchen HYPERLIPIDEMIA 09/09/2006  . HYPERTENSION 09/09/2006  . LEFT BUNDLE BRANCH BLOCK 08/11/2006  . OSTEOARTHRITIS 08/11/2006  . OSTEOPOROSIS 08/11/2006  . PAF (paroxysmal atrial fibrillation) ([place]) 04/12/2009   Paroxysmal, only prior documented episode was years ago.  She was not started on anticoagulation back then due to hemorrhoidal bleeding, apparently profuse.  Event monitor (3/14) showed only NSR.    Marland Kitchen Stroke ([place]) 12-06-13  . Syncope    8/15. EEG (5/15) was unremarkable. // 4/18: ILR neg for arrhythmia - ? orthostatic (no prodrome reported) >> c/b SAH (small)   . TIA 09/02/2006   Echocardiogram 6/12: Mild to moderate MR, trace AI, trace TR, PASP 34, bubble study negative for intracardiac shunting, normal EF (greater than 55%) //  Possible TIA in 10/15 with visual field cut.  MRI (10/15) with no CVA. Linq monitor placed.    Past Surgical History:  Procedure Laterality Date  . CAROTID ENDARTERECTOMY Left 12-05-2010  . CLOSED REDUCTION METATARSAL FRACTURE     right 5th  . COLONOSCOPY W/ POLYPECTOMY  2001  . EYE SURGERY     catarac  . LOOP RECORDER IMPLANT N/A 01/03/2014   Procedure: LOOP RECORDER IMPLANT;  Surgeon: Evans Lance, MD;  Location: [facility] CATH LAB;  Service: Cardiovascular;  Laterality: N/A;  . TONSILLECTOMY      Current Medications: Current Meds  Medication Sig  . acetaminophen (TYLENOL) 325 MG tablet Take 2 tablets (650 mg total) by mouth every 4 (four) hours as needed for mild pain (or temp > 37.5 C (99.5 F)).  . Ascorbic Acid (VITAMIN C PO) Take 1 tablet by mouth daily.   Marland Kitchen atorvastatin (LIPITOR) 20 MG tablet Take 1 tablet (20 mg total) by mouth daily at 6 PM.  . calcium carbonate 200 MG capsule Take 200 mg by mouth daily.   . Cholecalciferol (VITAMIN D PO) Take 1 tablet by mouth daily.   . Cyanocobalamin (VITAMIN B 12 PO) Take 1  tablet by mouth daily.   Marland Kitchen docusate sodium (COLACE) 100 MG capsule Take 1 capsule (100 mg total) by mouth 2 (two) times daily.  . fish oil-omega-3 fatty acids 1000 MG capsule Take 1 capsule by mouth daily.   Marland Kitchen glucose blood test strip Use once daily  . ipratropium-albuterol (DUONEB) 0.5-2.5 (3) MG/3ML SOLN Take 4 times a day and every 4 hours as needed for diff breathing  . metoprolol tartrate (LOPRESSOR) 25 MG tablet TAKE ONE-HALF (1/2) TABLET TWICE A DAY  . mirtazapine (REMERON) 15 MG tablet Take 1 tablet (15 mg total) by mouth at bedtime.  . Multiple Vitamins-Minerals (MULTIVITAMIN WITH MINERALS)  tablet Take 1 tablet by mouth daily.  . ONE TOUCH LANCETS MISC Use daily as directed  . pantoprazole (PROTONIX) 20 MG tablet TAKE 1 TABLET EVERY OTHER DAY  . PROAIR RESPICLICK 742 (90 Base) MCG/ACT AEPB Inhale 2 puffs into the lungs every 4 (four) hours as needed.  Marland Kitchen VITAMIN E PO Take 1 tablet by mouth daily.   . [DISCONTINUED] atorvastatin (LIPITOR) 20 MG tablet Take 1 tablet (20 mg total) by mouth daily at 6 PM.     Allergies:   Nitrofurantoin; Sulfamethoxazole-trimethoprim; and Sulfonamide derivatives   Social History   Social History  . Marital status: Widowed    Spouse name: N/A  . Number of children: 3  . Years of education: 12   Occupational History  .  Retired   Social History Main Topics  . Smoking status: Never Smoker  . Smokeless tobacco: Never Used  . Alcohol use No  . Drug use: No  . Sexual activity: Not Asked   Other Topics Concern  . None   Social History Narrative   Patient is a widow and lives alone.   Patient has three adult children.   Patient is retired.   Patient has a high school.   Patient is right-handed.   Patient does not drink any caffeine.     Family Hx: The patient's family history includes Breast cancer in her sister; Colon cancer in her brother; Congestive Heart Failure (age of onset: 57) in her father; Heart attack in her mother; Heart  disease in her brother; Heart disease (age of onset: 62) in her mother; Kidney disease in her mother; Uterine cancer in her sister.  ROS:   Please see the history of present illness.    ROS All other systems reviewed and are negative.   EKGs/Labs/Other Test Reviewed:    EKG:  EKG is  ordered today.  The ekg ordered today demonstrates NSR, HR 60, LBBB, no changes.  Recent Labs: 05/22/2016: TSH 3.46 06/03/2016: ALT 26; BUN 23; Creatinine, Ser 1.08; Hemoglobin 11.3; Magnesium 1.8; Platelets 167; Potassium 4.0; Sodium 138   Recent Lipid Panel    Component Value Date/Time   CHOL 126 05/22/2016 1223   TRIG 128.0 05/22/2016 1223   TRIG 80 01/13/2006 1010   HDL 47.40 05/22/2016 1223   CHOLHDL 3 05/22/2016 1223   VLDL 25.6 05/22/2016 1223   LDLCALC 53 05/22/2016 1223     Physical Exam:    VS:  BP (!) 148/67   Pulse 60   Ht 5' 6"  (1.676 m)   Wt 127 lb (57.6 kg)   BMI 20.50 kg/m     Orthostatic VS for the past 24 hrs (Last 3 readings):  BP- Lying Pulse- Lying BP- Sitting Pulse- Sitting BP- Standing at 0 minutes Pulse- Standing at 0 minutes BP- Standing at 3 minutes Pulse- Standing at 3 minutes  06/19/16 1125 149/65 57 141/62 62 112/60 76 113/54 65     Wt Readings from Last 3 Encounters:  06/19/16 127 lb (57.6 kg)  06/07/16 125 lb 9.6 oz (57 kg)  06/03/16 125 lb 7.1 oz (56.9 kg)     Physical Exam  Constitutional: She is oriented to person, place, and time. She appears well-developed and well-nourished. No distress.  HENT:  Head: Normocephalic and atraumatic.  Eyes: No scleral icterus.  Neck: Normal range of motion. No JVD present.  Cardiovascular: Normal rate, regular rhythm, S1 normal and S2 normal.   Murmur heard.  Low-pitched systolic murmur is present with a grade  of 2/6  at the upper right sternal border Pulmonary/Chest: Breath sounds normal. She has no wheezes. She has no rhonchi. She has no rales.  Abdominal: Soft. There is no tenderness.  Musculoskeletal: She  exhibits no edema.  Neurological: She is alert and oriented to person, place, and time.  Skin: Skin is warm and dry.  Psychiatric: She has a normal mood and affect.    ASSESSMENT:    1. Syncope, unspecified syncope type   2. Subarachnoid hematoma with loss of consciousness, subsequent encounter   3. Coronary artery disease involving native coronary artery of native heart without angina pectoris   4. Carotid artery disease, unspecified laterality ([place])   5. Essential hypertension   6. PAF (paroxysmal atrial fibrillation) (H[facility])    PLAN:    In order of problems listed above:  1. Syncope, unspecified syncope type - Etiology not clear.  She had no arrhythmia on ILR.  Her device was interrogated again today.  Her BP does drop sitting to standing today.  So it is possible her syncope was related to orthostatic hypotension.  However, she had no prodrome and is not sure how she ended up on the ground.  I agree she should not be on Lasix.  She will remain off of ASA for now.  Do not resume Plavix.  Given her unexplained syncope, I have recommended no driving for 6 mos.  She has a murmur on exam. It sounds benign.  But, I will get an echo.  2. Subarachnoid hematoma with loss of consciousness, subsequent encounter - No follow up testing needed per neurosurgery.   3. Coronary artery disease involving native coronary artery of native heart without angina pectoris - She denies angina.  Continue statin.  I will d/w Dr. Liam Rogers today to determine when we should resume ASA.  4. Carotid artery disease, unspecified laterality ([place]) - FU with VVS as planned.   5. Essential hypertension - BP does drop with standing.  I have recommended she hydrate well, stand slowly and to get compression stockings.  6. PAF (paroxysmal atrial fibrillation) (H[facility]) - No recurrence to date.  At this point, I am not sure if she would be a candidate for anticoagulation if recurrent AF is documented in the future.    Dispo:  Return in about 3 months (around 09/19/2016) for Routine Follow Up, w/ [name], PA-C.  I will establish her with Dr. Liam Rogers and see her along with him.    Medication Adjustments/Labs and Tests Ordered: Current medicines are reviewed at length with the patient today.  Concerns regarding medicines are outlined above.  Orders/Tests:  Orders Placed This Encounter  Procedures  . EKG 12-Lead  . ECHOCARDIOGRAM COMPLETE   Medication changes: Meds ordered this encounter  Medications  . atorvastatin (LIPITOR) 20 MG tablet    Sig: Take 1 tablet (20 mg total) by mouth daily at 6 PM.    Dispense:  90 tablet    Refill:  3   Signed, [name], PA-C  06/19/2016 12:27 PM    [facility] [name], [place], [place]  [address] Phone: [phone]; Fax: [phone]

## 2016-06-19 NOTE — Progress Notes (Signed)
Loop check in clinic. Battery status: GOOD. R-waves 0.40mV. 0 symptom episodes, 0 tachy episodes, 0 pause episodes, 0 brady episodes. 0 AF episodes (0% burden). Monthly summary reports and ROV with GT as needed.

## 2016-06-19 NOTE — Patient Instructions (Addendum)
Medication Instructions:    Your physician recommends that you continue on your current medications as directed. Please refer to the Current Medication list given to you today.  Labwork:  None ordered  Testing/Procedures:  Schedule an echocardiogram   Follow-Up:  [name], PA-C in 3 months  - If you need a refill on your cardiac medications before your next appointment, please call your pharmacy.   Thank you for choosing [facility]!!    Any Other Special Instructions Will Be Listed Below (If Applicable). I recommend you not drive for 6 months. Please get some compression stockings (over the counter) and wear them every day (from morning to night).  You can get knee high. Remember to stand up slowly.  Remember to pump your calf muscles before standing. Remember to drink plenty of fluids daily.

## 2016-06-29 ENCOUNTER — Other Ambulatory Visit: Payer: Self-pay | Admitting: Family Medicine

## 2016-07-04 ENCOUNTER — Encounter: Payer: Self-pay | Admitting: Physician Assistant

## 2016-07-04 ENCOUNTER — Ambulatory Visit (HOSPITAL_COMMUNITY): Payer: Medicare Other | Attending: Cardiovascular Disease

## 2016-07-04 ENCOUNTER — Other Ambulatory Visit: Payer: Self-pay

## 2016-07-04 ENCOUNTER — Ambulatory Visit: Payer: Medicare Other | Admitting: Pulmonary Disease

## 2016-07-04 DIAGNOSIS — I700 Atherosclerosis of aorta: Secondary | ICD-10-CM | POA: Insufficient documentation

## 2016-07-04 DIAGNOSIS — I340 Nonrheumatic mitral (valve) insufficiency: Secondary | ICD-10-CM | POA: Insufficient documentation

## 2016-07-04 DIAGNOSIS — R55 Syncope and collapse: Secondary | ICD-10-CM | POA: Insufficient documentation

## 2016-07-05 ENCOUNTER — Telehealth: Payer: Self-pay | Admitting: Family Medicine

## 2016-07-05 ENCOUNTER — Encounter: Payer: Self-pay | Admitting: Family Medicine

## 2016-07-05 ENCOUNTER — Other Ambulatory Visit: Payer: Self-pay | Admitting: Physician Assistant

## 2016-07-05 ENCOUNTER — Telehealth: Payer: Self-pay | Admitting: *Deleted

## 2016-07-05 ENCOUNTER — Telehealth: Payer: Self-pay | Admitting: Physician Assistant

## 2016-07-05 ENCOUNTER — Ambulatory Visit (INDEPENDENT_AMBULATORY_CARE_PROVIDER_SITE_OTHER): Payer: Medicare Other | Admitting: Family Medicine

## 2016-07-05 VITALS — BP 130/58 | HR 64 | Temp 97.5°F | Wt 128.4 lb

## 2016-07-05 DIAGNOSIS — Z8679 Personal history of other diseases of the circulatory system: Secondary | ICD-10-CM

## 2016-07-05 DIAGNOSIS — I480 Paroxysmal atrial fibrillation: Secondary | ICD-10-CM | POA: Diagnosis not present

## 2016-07-05 DIAGNOSIS — I10 Essential (primary) hypertension: Secondary | ICD-10-CM

## 2016-07-05 DIAGNOSIS — F5104 Psychophysiologic insomnia: Secondary | ICD-10-CM | POA: Diagnosis not present

## 2016-07-05 MED ORDER — MIRTAZAPINE 15 MG PO TABS: 15.0000 mg | ORAL_TABLET | Freq: Every day | ORAL | 6 refills | Status: DC

## 2016-07-05 MED ORDER — CLOPIDOGREL BISULFATE 75 MG PO TABS: 75.0000 mg | ORAL_TABLET | Freq: Every day | ORAL | 1 refills | Status: DC

## 2016-07-05 MED ORDER — PANTOPRAZOLE SODIUM 20 MG PO TBEC: 20.0000 mg | DELAYED_RELEASE_TABLET | ORAL | 2 refills | Status: DC

## 2016-07-05 MED ORDER — ASPIRIN EC 81 MG PO TBEC: 81.0000 mg | DELAYED_RELEASE_TABLET | Freq: Every day | ORAL | 3 refills | Status: DC

## 2016-07-05 NOTE — Telephone Encounter (Signed)
Pt asked for WIll, OPT to request to rescheduled her d/c therapy until next week.  If Will (verbal orders) is unavailable you can leave detail msg

## 2016-07-05 NOTE — Telephone Encounter (Signed)
Verbal orders given  

## 2016-07-05 NOTE — Telephone Encounter (Signed)
-----   Message from Nuala Alpha, LPN sent at 8/88/9169  4:58 PM EDT -----   ----- Message ----- From: Liliane Shi, PA-C Sent: 07/04/2016   3:08 PM To: [address] Triage  Please call the patient. The echocardiogram demonstrates normal heart function (ejection fraction).   There is some impaired relaxation (diastolic dysfunction) and the mitral valve leaks some. There has not been a significant change since the last echocardiogram. Continue current management.  Please route a copy of this study result to her PCP:  Eulas Post, MD  [name], PA-C    07/04/2016 3:04 PM

## 2016-07-05 NOTE — Telephone Encounter (Signed)
Pt has been notified echo results and findings. Pt states to me is [name]. PA aware of some of her medications had been changed by Dr. Elease Hashimoto; see PCP OV note 06/07/16. Pt states she is not on ASA, Plavix, or Lasix. Pt also states to me she would like for [employer]. PA to reconsider his decision about her not driving x 6 months due to syncope. Pt states she knows a lot of her friends who have passed out and they are fine. Pt also states Dr. Elease Hashimoto told her he did not know why she could not drive. I explained to the pt that the driving restrictions are to protect her as well as other people. Pt said this is an inconvenient . I advised pt that I will pass her message onto [place] for further advice. Pt thanked me for my call today and my time. I have changed the medication list per pt request.

## 2016-07-05 NOTE — Telephone Encounter (Signed)
ok 

## 2016-07-05 NOTE — Telephone Encounter (Signed)
I reviewed with the neurosurgeon that saw her in the hospital. It is ok for her to start ASA now.   Please have her start ASA 81 mg QD.   I will review with EP regarding driving restrictions.  [name], PA-C    07/05/2016 2:16 PM

## 2016-07-05 NOTE — Progress Notes (Signed)
Subjective:     Patient ID: [name], female   DOB: [birth year], 83 y.o.   MRN: [id number]  HPI Patient seen for medical follow-up. Recent subarachnoid hemorrhage. It was described in her admission note that she had syncopal episode but patient disputes this and states that she was making her bed and was twisting to turn and there was a "bubbled up" place in the carpet and she twisted and fell.  She was advised couple weeks ago not to drive for 6 months per cardiology and patient is very upset about this. She claims that she has not had any syncope now in several months and this was rather a fall from tripping on the carpet as above. In any event she's not had any further episodes since then. She feels her balance is better at this time. She just finished up home physical therapy.  Denies any orthostatic symptoms.  She continues to hold Plavix and aspirin. She was started recently on mirtazapine and just ran out of this. She feels this helped her appetite and weight is up 3 pounds. She was sleeping well with mirtazapine with no adverse side effects.  Had previously been on Ambien for years.  Past Medical History:  Diagnosis Date  . ANEMIA, NORMOCYTIC 11/30/2009  . CAD (coronary artery disease) 04/27/2008   Non-Obstructive >> Cardiac catheterization 6/08: EF 55%, 1+ MR, Dx 50%, LAD 50%, dLAD 40%, OM1 50-60% (up to 70%), pOM 30%, pRCA 40%, mRCA 50-60%, pPDA 50-60% (up to 70%).  Medical therapy was recommended // MV 11/14: Normal stress nuclear study. LV Ejection Fraction: 67%.   . Carotid artery disease ([place])    Left CEA in 10/12.  Carotid dopplers (10/15) with 1-39% bilateral ICA stenosis. // Carotid US 10/17: R < 40; patent L CEA site  . COLONIC POLYPS, ADENOMATOUS 12/26/2009  . DIABETES MELLITUS, TYPE II 08/11/2006  . DIVERTICULOSIS, COLON 08/11/2006  . GERD 02/16/2007  . Hemorrhoids    s/p banding  . History of echocardiogram    Echo 5/18: EF 60-65, no RWMA, Gr 2 DD, mild-mod MR, mod LAE //  Echo 2/17: Mild LVH, mild focal basal septal hypertrophy, EF 55-60%, no RWMA, Gr 1 DD, MAC, mild MR, mild  // Echo 4/14: EF 60-65%, normal wall motion, Gr 1 DD, MAC, mild MR, PASP 32, reduced excursion of AV noncoronary cusp.    Marland Kitchen HYPERLIPIDEMIA 09/09/2006  . HYPERTENSION 09/09/2006  . LEFT BUNDLE BRANCH BLOCK 08/11/2006  . OSTEOARTHRITIS 08/11/2006  . OSTEOPOROSIS 08/11/2006  . PAF (paroxysmal atrial fibrillation) ([place]) 04/12/2009   Paroxysmal, only prior documented episode was years ago.  She was not started on anticoagulation back then due to hemorrhoidal bleeding, apparently profuse.  Event monitor (3/14) showed only NSR.    Marland Kitchen Stroke ([place]) 12-06-13  . Syncope    8/15. EEG (5/15) was unremarkable. // 4/18: ILR neg for arrhythmia - ? orthostatic (no prodrome reported) >> c/b SAH (small)   . TIA 09/02/2006   Echocardiogram 6/12: Mild to moderate MR, trace AI, trace TR, PASP 34, bubble study negative for intracardiac shunting, normal EF (greater than 55%) //  Possible TIA in 10/15 with visual field cut.  MRI (10/15) with no CVA. Linq monitor placed.   Past Surgical History:  Procedure Laterality Date  . CAROTID ENDARTERECTOMY Left 12-05-2010  . CLOSED REDUCTION METATARSAL FRACTURE     right 5th  . COLONOSCOPY W/ POLYPECTOMY  2001  . EYE SURGERY     catarac  . LOOP RECORDER IMPLANT  N/A 01/03/2014   Procedure: LOOP RECORDER IMPLANT;  Surgeon: Evans Lance, MD;  Location: [facility] CATH LAB;  Service: Cardiovascular;  Laterality: N/A;  . TONSILLECTOMY      reports that she has never smoked. She has never used smokeless tobacco. She reports that she does not drink alcohol or use drugs. family history includes Breast cancer in her sister; Colon cancer in her brother; Congestive Heart Failure (age of onset: 95) in her father; Heart attack in her mother; Heart disease in her brother; Heart disease (age of onset: 30) in her mother; Kidney disease in her mother; Uterine cancer in her sister. Allergies   Allergen Reactions  . Nitrofurantoin     unknown  . Sulfamethoxazole-Trimethoprim     unknown  . Sulfonamide Derivatives     REACTION: rash     Review of Systems  Constitutional: Negative for fatigue.  Eyes: Negative for visual disturbance.  Respiratory: Negative for cough, chest tightness, shortness of breath and wheezing.   Cardiovascular: Negative for chest pain, palpitations and leg swelling.  Neurological: Negative for dizziness, seizures, syncope, weakness, light-headedness and headaches.       Objective:   Physical Exam  Constitutional: She is oriented to person, place, and time. She appears well-developed and well-nourished.  Eyes: Pupils are equal, round, and reactive to light.  Neck: Neck supple. No JVD present. No thyromegaly present.  Cardiovascular: Normal rate and regular rhythm.  Exam reveals no gallop.   Pulmonary/Chest: Effort normal and breath sounds normal. No respiratory distress. She has no wheezes. She has no rales.  Musculoskeletal: She exhibits no edema.  Neurological: She is alert and oriented to person, place, and time. No cranial nerve deficit. Coordination normal.  Ambulating without assistance.       Assessment:     #1 hypertension stable and at goal  #2 recent subarachnoid hemorrhage related to trauma. Remains in question whether she had syncopal episode versus tripped and fell  #3 history of atrial fibrillation. Patient is a poor candidate for anticoagulation  #4 interstitial lung disease  #5 chronic insomnia    Plan:     -Refill mirtazapine -avoid benzodiazepines. -We discussed fall risk prevention -Just finished physical therapy as above -Reinforced idea of not driving for the next several months and patient is very oppositional to this idea  Eulas Post MD [facility]

## 2016-07-09 ENCOUNTER — Telehealth: Payer: Self-pay | Admitting: Family Medicine

## 2016-07-09 NOTE — Telephone Encounter (Signed)
I called the pt to advise ok per Brynda Rim. PA and Neurosurgeon (Dr. Jennette Kettle who saw pt in the hospital ok to start ASA 81 mg daily. I did also advise pt Nicki Reaper W. PA will d/w Dr. Lovena Le as to if she can drive or have to wait 6 months due to syncope. I advised pt once I have an answer on driving I will let her know. Pt thanked me for my call today.

## 2016-07-09 NOTE — Telephone Encounter (Signed)
Dr. Vertell Limber is the neurosurgeon that saw her in the hospital.  I did review with Dr. Cristopher Peru.  He also recommends no driving for 6 months.  [name], PA-C    07/09/2016 4:42 PM

## 2016-07-09 NOTE — Telephone Encounter (Signed)
We do not want to place her back on Ambien (increased fall risk).  Have her try reducing the dose to one half tablet(currently on 15 mg) at night.

## 2016-07-09 NOTE — Telephone Encounter (Addendum)
Pt states the rx mirtazapine (REMERON) 15 MG tablet  Dr put her on is too much. She feels like staying in bed all day, hard to get going in the am, just doesn't feel like herself.  Pt states she never felt that way on Ambien.

## 2016-07-10 NOTE — Telephone Encounter (Signed)
Instructed patient regarding decreasing dose of Remeron to 1/2 nightly, and educated patient on reasoning why MD wants to stay away from Ambien, patient voiced understanding and was able to teach back instructions.

## 2016-07-17 NOTE — Telephone Encounter (Signed)
Lmtcb to go over recommendations as well from Dr. Lovena Le no driving x 6 months from first syncope episode.

## 2016-07-19 ENCOUNTER — Encounter: Payer: Self-pay | Admitting: Physician Assistant

## 2016-08-19 ENCOUNTER — Ambulatory Visit (INDEPENDENT_AMBULATORY_CARE_PROVIDER_SITE_OTHER): Payer: Medicare Other | Admitting: Family Medicine

## 2016-08-19 ENCOUNTER — Encounter: Payer: Self-pay | Admitting: Family Medicine

## 2016-08-19 VITALS — BP 102/58 | HR 70 | Temp 98.3°F | Wt 129.7 lb

## 2016-08-19 DIAGNOSIS — K219 Gastro-esophageal reflux disease without esophagitis: Secondary | ICD-10-CM | POA: Diagnosis not present

## 2016-08-19 DIAGNOSIS — I10 Essential (primary) hypertension: Secondary | ICD-10-CM | POA: Diagnosis not present

## 2016-08-19 DIAGNOSIS — E118 Type 2 diabetes mellitus with unspecified complications: Secondary | ICD-10-CM

## 2016-08-19 LAB — POCT GLYCOSYLATED HEMOGLOBIN (HGB A1C): Hemoglobin A1C: 6.6

## 2016-08-19 NOTE — Progress Notes (Signed)
Subjective:     Patient ID: [name], female   DOB: 1920/03/30, 83 y.o.   MRN: [id number]  HPI Patient for medical follow-up. She has not had any further falls since last visit. She has gained a couple more pounds. She is no longer taking Remeron at night. She is apparently sleeping fairly well. Denies any headaches. No chest pains.  Chronic problems include hypertension, atrial fibrillation, interstitial lung disease, chronic insomnia. She has mild hyperlipidemia. Her current medications include Protonix, aspirin, Lipitor. She recently left her Protonix when she went out of town and had breakthrough symptoms when not taking that.  Type 2 diabetes. Last A1c 7.1%. Not monitoring sugars at home. No polyuria or polydipsia.  Past Medical History:  Diagnosis Date  . ANEMIA, NORMOCYTIC 11/30/2009  . CAD (coronary artery disease) 04/27/2008   Non-Obstructive >> Cardiac catheterization 6/08: EF 55%, 1+ MR, Dx 50%, LAD 50%, dLAD 40%, OM1 50-60% (up to 70%), pOM 30%, pRCA 40%, mRCA 50-60%, pPDA 50-60% (up to 70%).  Medical therapy was recommended // MV 11/14: Normal stress nuclear study. LV Ejection Fraction: 67%.   . Carotid artery disease ([place])    Left CEA in 10/12.  Carotid dopplers (10/15) with 1-39% bilateral ICA stenosis. // Carotid US 10/17: R < 40; patent L CEA site  . COLONIC POLYPS, ADENOMATOUS 12/26/2009  . DIABETES MELLITUS, TYPE II 08/11/2006  . DIVERTICULOSIS, COLON 08/11/2006  . GERD 02/16/2007  . Hemorrhoids    s/p banding  . History of echocardiogram    Echo 5/18: EF 60-65, no RWMA, Gr 2 DD, mild-mod MR, mod LAE // Echo 2/17: Mild LVH, mild focal basal septal hypertrophy, EF 55-60%, no RWMA, Gr 1 DD, MAC, mild MR, mild  // Echo 4/14: EF 60-65%, normal wall motion, Gr 1 DD, MAC, mild MR, PASP 32, reduced excursion of AV noncoronary cusp.    Marland Kitchen HYPERLIPIDEMIA 09/09/2006  . HYPERTENSION 09/09/2006  . LEFT BUNDLE BRANCH BLOCK 08/11/2006  . OSTEOARTHRITIS 08/11/2006  . OSTEOPOROSIS  08/11/2006  . PAF (paroxysmal atrial fibrillation) ([place]) 04/12/2009   Paroxysmal, only prior documented episode was years ago.  She was not started on anticoagulation back then due to hemorrhoidal bleeding, apparently profuse.  Event monitor (3/14) showed only NSR.    Marland Kitchen Stroke ([place]) 12-06-13  . Syncope    8/15. EEG (5/15) was unremarkable. // 4/18: ILR neg for arrhythmia - ? orthostatic (no prodrome reported) >> c/b SAH (small)   . TIA 09/02/2006   Echocardiogram 6/12: Mild to moderate MR, trace AI, trace TR, PASP 34, bubble study negative for intracardiac shunting, normal EF (greater than 55%) //  Possible TIA in 10/15 with visual field cut.  MRI (10/15) with no CVA. Linq monitor placed.   Past Surgical History:  Procedure Laterality Date  . CAROTID ENDARTERECTOMY Left 12-05-2010  . CLOSED REDUCTION METATARSAL FRACTURE     right 5th  . COLONOSCOPY W/ POLYPECTOMY  2001  . EYE SURGERY     catarac  . LOOP RECORDER IMPLANT N/A 01/03/2014   Procedure: LOOP RECORDER IMPLANT;  Surgeon: Evans Lance, MD;  Location: [facility] CATH LAB;  Service: Cardiovascular;  Laterality: N/A;  . TONSILLECTOMY      reports that she has never smoked. She has never used smokeless tobacco. She reports that she does not drink alcohol or use drugs. family history includes Breast cancer in her sister; Colon cancer in her brother; Congestive Heart Failure (age of onset: 58) in her father; Heart attack in her mother;  Heart disease in her brother; Heart disease (age of onset: 60) in her mother; Kidney disease in her mother; Uterine cancer in her sister. Allergies  Allergen Reactions  . Nitrofurantoin     unknown  . Sulfamethoxazole-Trimethoprim     unknown  . Sulfonamide Derivatives     REACTION: rash     Review of Systems  Constitutional: Negative for appetite change and unexpected weight change.  Eyes: Negative for visual disturbance.  Respiratory: Negative for cough, chest tightness, shortness of breath and  wheezing.   Cardiovascular: Negative for chest pain, palpitations and leg swelling.  Endocrine: Negative for polydipsia and polyuria.  Neurological: Negative for dizziness, seizures, syncope, weakness, light-headedness and headaches.       Objective:   Physical Exam  Constitutional: She appears well-developed and well-nourished.  Eyes: Pupils are equal, round, and reactive to light.  Neck: Neck supple. No JVD present. No thyromegaly present.  Cardiovascular: Normal rate and regular rhythm.  Exam reveals no gallop.   Pulmonary/Chest: Effort normal and breath sounds normal. No respiratory distress. She has no wheezes. She has no rales.  Musculoskeletal: She exhibits no edema.  Neurological: She is alert.       Assessment:     #1 hypertension stable and at goal  #2 type 2 diabetes. History of fair control.  Repeat A1c today 6.6%  #3 dyslipidemia  #4 recent subarachnoid bleed related to fall. She's felt steady on her feet since then. Completed home physical therapy    Plan:     -Recheck hemoglobin A1c (as above) -Continue current medications -Avoid benzodiazepines or Ambien at night to reduce risk of fall -Routine follow-up in 4 months and sooner as needed  Desiree Post MD [facility]

## 2016-09-04 ENCOUNTER — Ambulatory Visit: Payer: Medicare Other | Admitting: Family Medicine

## 2016-09-05 ENCOUNTER — Observation Stay (HOSPITAL_COMMUNITY)
Admission: EM | Admit: 2016-09-05 | Discharge: 2016-09-07 | Disposition: A | Discharge status: Home / Self Care | Payer: Medicare Other | Attending: Internal Medicine | Admitting: Internal Medicine

## 2016-09-05 ENCOUNTER — Emergency Department (HOSPITAL_COMMUNITY): Payer: Medicare Other

## 2016-09-05 ENCOUNTER — Encounter (HOSPITAL_COMMUNITY): Payer: Self-pay

## 2016-09-05 DIAGNOSIS — Z7982 Long term (current) use of aspirin: Secondary | ICD-10-CM | POA: Insufficient documentation

## 2016-09-05 DIAGNOSIS — I5032 Chronic diastolic (congestive) heart failure: Secondary | ICD-10-CM | POA: Insufficient documentation

## 2016-09-05 DIAGNOSIS — I480 Paroxysmal atrial fibrillation: Secondary | ICD-10-CM | POA: Insufficient documentation

## 2016-09-05 DIAGNOSIS — J849 Interstitial pulmonary disease, unspecified: Secondary | ICD-10-CM | POA: Diagnosis present

## 2016-09-05 DIAGNOSIS — J9621 Acute and chronic respiratory failure with hypoxia: Secondary | ICD-10-CM | POA: Insufficient documentation

## 2016-09-05 DIAGNOSIS — Z9689 Presence of other specified functional implants: Secondary | ICD-10-CM | POA: Insufficient documentation

## 2016-09-05 DIAGNOSIS — W228XXA Striking against or struck by other objects, initial encounter: Secondary | ICD-10-CM | POA: Insufficient documentation

## 2016-09-05 DIAGNOSIS — I10 Essential (primary) hypertension: Secondary | ICD-10-CM | POA: Diagnosis present

## 2016-09-05 DIAGNOSIS — S022XXB Fracture of nasal bones, initial encounter for open fracture: Secondary | ICD-10-CM | POA: Diagnosis not present

## 2016-09-05 DIAGNOSIS — W1830XA Fall on same level, unspecified, initial encounter: Secondary | ICD-10-CM | POA: Insufficient documentation

## 2016-09-05 DIAGNOSIS — S0512XA Contusion of eyeball and orbital tissues, left eye, initial encounter: Secondary | ICD-10-CM | POA: Insufficient documentation

## 2016-09-05 DIAGNOSIS — S7002XA Contusion of left hip, initial encounter: Secondary | ICD-10-CM | POA: Diagnosis not present

## 2016-09-05 DIAGNOSIS — E875 Hyperkalemia: Secondary | ICD-10-CM | POA: Insufficient documentation

## 2016-09-05 DIAGNOSIS — W19XXXA Unspecified fall, initial encounter: Secondary | ICD-10-CM | POA: Diagnosis present

## 2016-09-05 DIAGNOSIS — I2510 Atherosclerotic heart disease of native coronary artery without angina pectoris: Secondary | ICD-10-CM | POA: Diagnosis not present

## 2016-09-05 DIAGNOSIS — Z882 Allergy status to sulfonamides status: Secondary | ICD-10-CM | POA: Diagnosis not present

## 2016-09-05 DIAGNOSIS — I447 Left bundle-branch block, unspecified: Secondary | ICD-10-CM | POA: Diagnosis not present

## 2016-09-05 DIAGNOSIS — E785 Hyperlipidemia, unspecified: Secondary | ICD-10-CM | POA: Diagnosis present

## 2016-09-05 DIAGNOSIS — K5900 Constipation, unspecified: Secondary | ICD-10-CM | POA: Diagnosis not present

## 2016-09-05 DIAGNOSIS — I110 Hypertensive heart disease with heart failure: Secondary | ICD-10-CM | POA: Diagnosis not present

## 2016-09-05 DIAGNOSIS — Z79899 Other long term (current) drug therapy: Secondary | ICD-10-CM | POA: Diagnosis not present

## 2016-09-05 DIAGNOSIS — S0121XA Laceration without foreign body of nose, initial encounter: Secondary | ICD-10-CM | POA: Insufficient documentation

## 2016-09-05 DIAGNOSIS — Z8673 Personal history of transient ischemic attack (TIA), and cerebral infarction without residual deficits: Secondary | ICD-10-CM | POA: Insufficient documentation

## 2016-09-05 DIAGNOSIS — M810 Age-related osteoporosis without current pathological fracture: Secondary | ICD-10-CM | POA: Diagnosis not present

## 2016-09-05 DIAGNOSIS — S60211A Contusion of right wrist, initial encounter: Secondary | ICD-10-CM | POA: Diagnosis not present

## 2016-09-05 DIAGNOSIS — E119 Type 2 diabetes mellitus without complications: Secondary | ICD-10-CM | POA: Insufficient documentation

## 2016-09-05 DIAGNOSIS — K219 Gastro-esophageal reflux disease without esophagitis: Secondary | ICD-10-CM | POA: Diagnosis not present

## 2016-09-05 DIAGNOSIS — G459 Transient cerebral ischemic attack, unspecified: Secondary | ICD-10-CM | POA: Diagnosis present

## 2016-09-05 DIAGNOSIS — E118 Type 2 diabetes mellitus with unspecified complications: Secondary | ICD-10-CM

## 2016-09-05 MED ORDER — CEFAZOLIN SODIUM-DEXTROSE 1-4 GM/50ML-% IV SOLN
1.0000 g | Freq: Once | INTRAVENOUS | Status: AC
  Administered 2016-09-05: 1 g via INTRAVENOUS
  Filled 2016-09-05: qty 50

## 2016-09-05 MED ORDER — MORPHINE SULFATE (PF) 4 MG/ML IV SOLN
2.0000 mg | INTRAVENOUS | Status: DC | PRN
  Administered 2016-09-05: 2 mg via INTRAVENOUS
  Filled 2016-09-05: qty 1

## 2016-09-05 MED ORDER — TRAMADOL HCL 50 MG PO TABS: 50.0000 mg | ORAL_TABLET | Freq: Four times a day (QID) | ORAL | 0 refills | Status: DC | PRN

## 2016-09-05 MED ORDER — HYDROCODONE-ACETAMINOPHEN 5-325 MG PO TABS: 1.0000 | ORAL_TABLET | Freq: Once | ORAL | Status: DC

## 2016-09-05 MED ORDER — ONDANSETRON HCL 4 MG/2ML IJ SOLN: 4.0000 mg | Freq: Once | INTRAMUSCULAR | Status: DC

## 2016-09-05 MED ORDER — CEPHALEXIN 500 MG PO CAPS: 500.0000 mg | ORAL_CAPSULE | Freq: Four times a day (QID) | ORAL | 0 refills | Status: DC

## 2016-09-05 MED ORDER — ONDANSETRON HCL 4 MG/2ML IJ SOLN
4.0000 mg | Freq: Once | INTRAMUSCULAR | Status: AC
  Administered 2016-09-05: 4 mg via INTRAVENOUS
  Filled 2016-09-05: qty 2

## 2016-09-05 MED ORDER — LIDOCAINE-EPINEPHRINE (PF) 2 %-1:200000 IJ SOLN
10.0000 mL | Freq: Once | INTRAMUSCULAR | Status: AC
  Administered 2016-09-05: 10 mL
  Filled 2016-09-05: qty 20

## 2016-09-05 NOTE — ED Notes (Signed)
Patient returned from XRAY 

## 2016-09-05 NOTE — ED Notes (Signed)
Pt saturations noted to be 85 with good pleth when getting pt ready to discharge. MD Jeneen Rinks notified. Verbal order for incentive spirometer and respiratory made aware.

## 2016-09-05 NOTE — ED Triage Notes (Signed)
Patient here by EMS for a fall from tripping and hitting a fence.  Left eye has a larger contusion with bleeding at the bridge of her nose.  Patient takes plavix.  Bleeding controlled with gauze at this time. No other trauma noted at this time.  Only complain is the head pain.

## 2016-09-05 NOTE — ED Notes (Signed)
Patient taken to CT.

## 2016-09-05 NOTE — ED Notes (Signed)
Pt returned from CT °

## 2016-09-05 NOTE — ED Provider Notes (Addendum)
[facility] DEPT Provider Note   CSN: [id number] Arrival date & time: 09/05/16  1606     History   Chief Complaint Chief Complaint  Patient presents with  . Fall    on plavix    HPI [name] is a 83 y.o. female. Chief complaint is fall, facial laceration  HPI: 83 year old female. Here with her son, and daughter-in-law. She was helping her son pull a branch down. She fell and struck her face against a stationary wood fence. She has a laceration above her nose and swelling and ecchymosis around her eye. She is on Plavix.  Initially no areas of pain. She has developed some discomfort over her right iliac crest well in the department here. She has been ambulatory since accident. No loss consciousness. Awake and alert. No amnesia for the event.  Past Medical History:  Diagnosis Date  . ANEMIA, NORMOCYTIC 11/30/2009  . CAD (coronary artery disease) 04/27/2008   Non-Obstructive >> Cardiac catheterization 6/08: EF 55%, 1+ MR, Dx 50%, LAD 50%, dLAD 40%, OM1 50-60% (up to 70%), pOM 30%, pRCA 40%, mRCA 50-60%, pPDA 50-60% (up to 70%).  Medical therapy was recommended // MV 11/14: Normal stress nuclear study. LV Ejection Fraction: 67%.   . Carotid artery disease ([place])    Left CEA in 10/12.  Carotid dopplers (10/15) with 1-39% bilateral ICA stenosis. // Carotid US 10/17: R < 40; patent L CEA site  . COLONIC POLYPS, ADENOMATOUS 12/26/2009  . DIABETES MELLITUS, TYPE II 08/11/2006  . DIVERTICULOSIS, COLON 08/11/2006  . GERD 02/16/2007  . Hemorrhoids    s/p banding  . History of echocardiogram    Echo 5/18: EF 60-65, no RWMA, Gr 2 DD, mild-mod MR, mod LAE // Echo 2/17: Mild LVH, mild focal basal septal hypertrophy, EF 55-60%, no RWMA, Gr 1 DD, MAC, mild MR, mild  // Echo 4/14: EF 60-65%, normal wall motion, Gr 1 DD, MAC, mild MR, PASP 32, reduced excursion of AV noncoronary cusp.    Marland Kitchen HYPERLIPIDEMIA 09/09/2006  . HYPERTENSION 09/09/2006  . LEFT BUNDLE BRANCH BLOCK 08/11/2006  .  OSTEOARTHRITIS 08/11/2006  . OSTEOPOROSIS 08/11/2006  . PAF (paroxysmal atrial fibrillation) ([place]) 04/12/2009   Paroxysmal, only prior documented episode was years ago.  She was not started on anticoagulation back then due to hemorrhoidal bleeding, apparently profuse.  Event monitor (3/14) showed only NSR.    Marland Kitchen Stroke ([place]) 12-06-13  . Syncope    8/15. EEG (5/15) was unremarkable. // 4/18: ILR neg for arrhythmia - ? orthostatic (no prodrome reported) >> c/b SAH (small)   . TIA 09/02/2006   Echocardiogram 6/12: Mild to moderate MR, trace AI, trace TR, PASP 34, bubble study negative for intracardiac shunting, normal EF (greater than 55%) //  Possible TIA in 10/15 with visual field cut.  MRI (10/15) with no CVA. Linq monitor placed.    Patient Active Problem List   Diagnosis Date Noted  . Traumatic subarachnoid bleed with LOC of 30 minutes or less, initial encounter ([place]) 06/03/2016  . ILD (interstitial lung disease) ([place]) 04/06/2015  . Cough 04/06/2015  . Shortness of breath 03/15/2015  . Dyspnea 03/15/2015  . SOB (shortness of breath) 03/09/2015  . Perennial allergic rhinitis 01/17/2014  . Transient alteration of awareness 11/04/2013  . Syncope 10/15/2013  . Chronic insomnia 06/30/2013  . Aftercare following surgery of the circulatory system, [place] 06/22/2013  . Seasonal allergies 06/08/2013  . Normocytic anemia 10/29/2012  . Benign paroxysmal positional vertigo 06/16/2012  . Vertigo 03/23/2012  .  Carotid artery disease ([place]) 10/18/2010  . Amaurosis fugax 10/18/2010  . Palpitations 06/14/2010  . KNEE PAIN 01/24/2010  . COLONIC POLYPS, ADENOMATOUS 12/26/2009  . Internal and external bleeding hemorrhoids 12/26/2009  . CONSTIPATION 12/26/2009  . ANEMIA, NORMOCYTIC 11/30/2009  . FATIGUE / MALAISE 11/27/2009  . PAF (paroxysmal atrial fibrillation) ([place]) 04/12/2009  . COLONIC POLYPS, HYPERPLASTIC, HX OF 03/01/2009  . CAD (coronary artery disease) 04/27/2008  . GERD 02/16/2007  .  Hyperlipidemia 09/09/2006  . Essential hypertension 09/09/2006  . Transient cerebral ischemia 09/02/2006  . Type 2 diabetes mellitus, controlled ([place]) 08/11/2006  . LEFT BUNDLE BRANCH BLOCK 08/11/2006  . DIVERTICULOSIS, COLON 08/11/2006  . Osteoarthritis 08/11/2006  . OSTEOPOROSIS 08/11/2006    Past Surgical History:  Procedure Laterality Date  . CAROTID ENDARTERECTOMY Left 12-05-2010  . CLOSED REDUCTION METATARSAL FRACTURE     right 5th  . COLONOSCOPY W/ POLYPECTOMY  2001  . EYE SURGERY     catarac  . LOOP RECORDER IMPLANT N/A 01/03/2014   Procedure: LOOP RECORDER IMPLANT;  Surgeon: Evans Lance, MD;  Location: [facility] CATH LAB;  Service: Cardiovascular;  Laterality: N/A;  . TONSILLECTOMY      OB History    No data available       Home Medications    Prior to Admission medications   Medication Sig Start Date End Date Taking? Authorizing Provider  Ascorbic Acid (VITAMIN C PO) Take 1 tablet by mouth daily.    Yes [provider]  aspirin EC 81 MG tablet Take 1 tablet (81 mg total) by mouth daily. 07/05/16  Yes Weaver, Scott T, PA-C  atorvastatin (LIPITOR) 20 MG tablet Take 1 tablet (20 mg total) by mouth daily at 6 PM. 06/19/16  Yes Weaver, Nicki Reaper T, PA-C  calcium carbonate 200 MG capsule Take 200 mg by mouth daily.    Yes [provider]  Cholecalciferol (VITAMIN D PO) Take 1 tablet by mouth daily.    Yes [provider]  Cyanocobalamin (VITAMIN B 12 PO) Take 1 tablet by mouth daily.    Yes [provider]  docusate sodium (COLACE) 100 MG capsule Take 1 capsule (100 mg total) by mouth 2 (two) times daily. 06/04/16  Yes Vann, Jessica U, DO  fish oil-omega-3 fatty acids 1000 MG capsule Take 1 capsule by mouth daily.    Yes [provider]  gabapentin (NEURONTIN) 100 MG capsule Take 100 mg by mouth as needed for pain. Toe pain 08/27/16  Yes [provider]  metoprolol tartrate (LOPRESSOR) 25 MG tablet TAKE ONE-HALF (1/2) TABLET TWICE  A DAY 06/17/16  Yes Burchette, Alinda Sierras, MD  Multiple Vitamins-Minerals (MULTIVITAMIN WITH MINERALS) tablet Take 1 tablet by mouth daily.   Yes [provider]  pantoprazole (PROTONIX) 20 MG tablet Take 1 tablet (20 mg total) by mouth every other day. PER PT 1/2 TAB QOD Patient taking differently: Take 10 mg by mouth every other day. PER PT 1/2 TAB QOD 07/05/16  Yes Weaver, Scott T, PA-C  VITAMIN E PO Take 1 tablet by mouth daily.    Yes [provider]  acetaminophen (TYLENOL) 325 MG tablet Take 2 tablets (650 mg total) by mouth every 4 (four) hours as needed for mild pain (or temp > 37.5 C (99.5 F)). 06/04/16   Geradine Girt, DO  cephALEXin (KEFLEX) 500 MG capsule Take 1 capsule (500 mg total) by mouth 4 (four) times daily. 09/05/16   Tanna Furry, MD  glucose blood test strip Use once daily  12/01/14   Burchette, Alinda Sierras, MD  ipratropium-albuterol (DUONEB) 0.5-2.5 (3) MG/3ML SOLN Take 4 times a day and every 4 hours as needed for diff breathing 04/11/15   de Ramseur, Hawkins, MD  ONE TOUCH LANCETS MISC Use daily as directed 03/25/11   Eulas Post, MD  [employer] [facility] [address] [phone] Base) MCG/ACT AEPB Inhale 2 puffs into the lungs every 4 (four) hours as needed. 04/04/15   Burchette, Alinda Sierras, MD  traMADol (ULTRAM) 50 MG tablet Take 1 tablet (50 mg total) by mouth every 6 (six) hours as needed. 09/05/16   Tanna Furry, MD    Family History Family History  Problem Relation Age of Onset  . Heart disease Mother 2  . Kidney disease Mother   . Heart attack Mother   . Congestive Heart Failure Father 32  . Colon cancer Brother   . Heart disease Brother   . Breast cancer Sister   . Uterine cancer Sister     Social History Social History  Substance Use Topics  . Smoking status: Never Smoker  . Smokeless tobacco: Never Used  . Alcohol use No     Allergies   Nitrofurantoin; Sulfamethoxazole-trimethoprim; and Sulfonamide derivatives   Review of Systems Review of Systems    Constitutional: Negative for appetite change, chills, diaphoresis, fatigue and fever.  HENT: Negative for mouth sores, sore throat and trouble swallowing.        Nasal laceration. Left eye periorbital edema and ecchymosis.  Eyes: Negative for visual disturbance.  Respiratory: Negative for cough, chest tightness, shortness of breath and wheezing.   Cardiovascular: Negative for chest pain.  Gastrointestinal: Negative for abdominal distention, abdominal pain, diarrhea, nausea and vomiting.  Endocrine: Negative for polydipsia, polyphagia and polyuria.  Genitourinary: Negative for dysuria, frequency and hematuria.  Musculoskeletal: Negative for gait problem.  Skin: Negative for color change, pallor and rash.  Neurological: Negative for dizziness, syncope, light-headedness and headaches.  Hematological: Does not bruise/bleed easily.  Psychiatric/Behavioral: Negative for behavioral problems and confusion.     Physical Exam Updated Vital Signs BP (!) 173/74   Pulse 80   Temp 97.6 F (36.4 C) (Oral)   Resp 19   SpO2 98%   Physical Exam  Constitutional: She is oriented to person, place, and time. She appears well-developed and well-nourished. No distress.  HENT:  Head: Normocephalic.  Circumferential left periorbital ecchymosis. Unable to visualize her globe. No hyphema. She reports normal vision. Reactive pupil. Dr. Rolinda Roan muscles full. Decreased sensation left maxilla. No blood over to him's or mastoids. No septal hematoma. 3 cm laceration over nasal bridge. No dental trauma.  Eyes: Pupils are equal, round, and reactive to light. Conjunctivae are normal. No scleral icterus.  Neck: Normal range of motion. Neck supple. No thyromegaly present.  Cardiovascular: Normal rate and regular rhythm.  Exam reveals no gallop and no friction rub.   No murmur heard. Pulmonary/Chest: Effort normal and breath sounds normal. No respiratory distress. She has no wheezes. She has no rales.  Abdominal: Soft.  Bowel sounds are normal. She exhibits no distension. There is no tenderness. There is no rebound.  Musculoskeletal: Normal range of motion.  Some tenderness over the right iliac crest. Not tender directly over the trochanter, or symphysis  Neurological: She is alert and oriented to person, place, and time.  Skin: Skin is warm and dry. No rash noted.  Psychiatric: She has a normal mood and affect. Her behavior is normal.     ED Treatments / Results  Labs (all labs ordered are listed, but only abnormal results are displayed) Labs Reviewed - No data to display  EKG  EKG Interpretation None       Radiology Dg Pelvis 1-2 Views  Result Date: 09/05/2016 CLINICAL DATA:  [place], right hip pain EXAM: PELVIS - 1-2 VIEW COMPARISON:  None. FINDINGS: Linear opacity in the right lower quadrant. SI joints are symmetric. Both femoral heads project in joint. No dislocation. Mild to moderate degenerative changes bilaterally. Questionable fracture of the right superior pubic ramus. Pubic symphysis is intact. IMPRESSION: 1. No dislocation 2. Questionable fracture involving the right superior pubic ramus. 3. Linear opacity overlies the upper aspect of right SI joint Electronically Signed   By: Donavan Foil M.D.   On: 09/05/2016 20:42   Dg Wrist Complete Right  Result Date: 09/05/2016 CLINICAL DATA:  Status post fall today with swelling and abrasion to the distal forearm complaining of wrist pain. EXAM: RIGHT WRIST - COMPLETE 3+ VIEW COMPARISON:  August 03, 2010 FINDINGS: There is no evidence of fracture or dislocation. Mild degenerative joint changes are identified. Chondrocalcinosis is noted. Soft tissues are unremarkable. IMPRESSION: No acute fracture or dislocation. Electronically Signed   By: Abelardo Diesel M.D.   On: 09/05/2016 17:27   Ct Head Wo Contrast  Result Date: 09/05/2016 CLINICAL DATA:  Fall EXAM: CT HEAD WITHOUT CONTRAST CT MAXILLOFACIAL WITHOUT CONTRAST TECHNIQUE: Multidetector CT imaging of  the head and maxillofacial structures were performed using the standard protocol without intravenous contrast. Multiplanar CT image reconstructions of the maxillofacial structures were also generated. COMPARISON:  06/03/2016, MRI 12/01/2013, 08/02/2016 FINDINGS: CT HEAD FINDINGS Brain: No acute territorial infarction, hemorrhage, or intracranial mass is seen. New focal hypodensity in the right cerebellum, probable old infarct but new since April 2018 CT. No surrounding mass effect. Moderate atrophy. Small vessel ischemic changes of the white matter. Stable ventricle size. Vascular: No hyperdense vessels.  Carotid artery calcifications. Skull: No depressed skull fracture is seen. Other: Large amount of left periorbital hematoma. CT MAXILLOFACIAL FINDINGS Osseous: Zygomatic arches are intact. Degenerative changes at the TMJ with flattening of the mandibular heads. No mandibular fracture is seen. Pterygoid plates are intact. Markedly comminuted, and displaced bilateral nasal bone fracture with involvement of the anterior nasal septum. Small comminuted fracture anterior nasal process of the maxillary bone. Orbits: Negative. No traumatic or inflammatory finding. Sinuses: No acute fluid levels. No sinus wall fracture. Mucosal thickening in the ethmoid sinuses. Soft tissues: Large left facial and periorbital hematoma with hematoma over the nasal area. Soft tissue swelling within the anterior nasal passage. IMPRESSION: 1. No definite CT evidence for acute intracranial abnormality. There is a focal hypodensity in the right cerebellum, suspected to represent an old infarct, but new since CT from April 2018. 2. Large amount of left facial and periorbital soft tissue hematoma. Severely comminuted, and displaced bilateral nasal bone fracture with comminuted fracture involving the anterior bony nasal septum. Small comminuted fracture involving the anterior nasal process of the maxillary bone. Negative for orbital fracture.  Electronically Signed   By: Donavan Foil M.D.   On: 09/05/2016 17:31   Ct Pelvis Wo Contrast  Result Date: 09/05/2016 CLINICAL DATA:  83 y/o F; status post fall with possible superior pubic ramus fracture on the right. EXAM: CT PELVIS WITHOUT CONTRAST TECHNIQUE: Multidetector CT imaging of the pelvis was performed following the standard protocol without intravenous contrast. COMPARISON:  None. FINDINGS: Urinary Tract:  No abnormality visualized. Bowel: Extensive sigmoid diverticulosis. No evidence for acute diverticulitis.  Vascular/Lymphatic: Calcific atherosclerosis of the abdominal aorta and bilateral iliofemoral arteries. Reproductive:  No mass or other significant abnormality Other:  None. Musculoskeletal: No acute fracture identified. Mild osteoarthrosis of the hip joints bilaterally with productive changes of the acetabular rims and femoral head neck junctions, greater on the right. Degenerative changes of the lower lumbar spine with prominent facet arthropathy. IMPRESSION: 1. No acute fracture or dislocation identified. 2. Chronic changes as above. Electronically Signed   By: Kristine Garbe M.D.   On: 09/05/2016 23:11   Ct Maxillofacial Wo Contrast  Result Date: 09/05/2016 CLINICAL DATA:  Fall EXAM: CT HEAD WITHOUT CONTRAST CT MAXILLOFACIAL WITHOUT CONTRAST TECHNIQUE: Multidetector CT imaging of the head and maxillofacial structures were performed using the standard protocol without intravenous contrast. Multiplanar CT image reconstructions of the maxillofacial structures were also generated. COMPARISON:  06/03/2016, MRI 12/01/2013, 08/02/2016 FINDINGS: CT HEAD FINDINGS Brain: No acute territorial infarction, hemorrhage, or intracranial mass is seen. New focal hypodensity in the right cerebellum, probable old infarct but new since April 2018 CT. No surrounding mass effect. Moderate atrophy. Small vessel ischemic changes of the white matter. Stable ventricle size. Vascular: No hyperdense  vessels.  Carotid artery calcifications. Skull: No depressed skull fracture is seen. Other: Large amount of left periorbital hematoma. CT MAXILLOFACIAL FINDINGS Osseous: Zygomatic arches are intact. Degenerative changes at the TMJ with flattening of the mandibular heads. No mandibular fracture is seen. Pterygoid plates are intact. Markedly comminuted, and displaced bilateral nasal bone fracture with involvement of the anterior nasal septum. Small comminuted fracture anterior nasal process of the maxillary bone. Orbits: Negative. No traumatic or inflammatory finding. Sinuses: No acute fluid levels. No sinus wall fracture. Mucosal thickening in the ethmoid sinuses. Soft tissues: Large left facial and periorbital hematoma with hematoma over the nasal area. Soft tissue swelling within the anterior nasal passage. IMPRESSION: 1. No definite CT evidence for acute intracranial abnormality. There is a focal hypodensity in the right cerebellum, suspected to represent an old infarct, but new since CT from April 2018. 2. Large amount of left facial and periorbital soft tissue hematoma. Severely comminuted, and displaced bilateral nasal bone fracture with comminuted fracture involving the anterior bony nasal septum. Small comminuted fracture involving the anterior nasal process of the maxillary bone. Negative for orbital fracture. Electronically Signed   By: Donavan Foil M.D.   On: 09/05/2016 17:31    Procedures Procedures (including critical care time)  Medications Ordered in ED Medications  morphine 4 MG/ML injection 2 mg (2 mg Intravenous Given 09/05/16 1820)  HYDROcodone-acetaminophen (NORCO/VICODIN) 5-325 MG per tablet 1 tablet (not administered)  ondansetron (ZOFRAN) injection 4 mg (not administered)  lidocaine-EPINEPHrine (XYLOCAINE W/EPI) 2 %-1:200000 (PF) injection 10 mL (10 mLs Infiltration Given by Other 09/05/16 1911)  ceFAZolin (ANCEF) IVPB 1 g/50 mL premix (0 g Intravenous Stopped 09/05/16 1850)    ondansetron (ZOFRAN) injection 4 mg (4 mg Intravenous Given 09/05/16 1820)     Initial Impression / Assessment and Plan / ED Course  I have reviewed the triage vital signs and the nursing notes.  Pertinent labs & imaging results that were available during my care of the patient were reviewed by me and considered in my medical decision making (see chart for details).    LACERATION REPAIR Performed by: Lolita Patella Authorized by: Lolita Patella Consent: Verbal consent obtained. Risks and benefits: risks, benefits and alternatives were discussed Consent given by: patient Patient identity confirmed: provided demographic data Prepped and Draped in normal sterile fashion Wound explored  Laceration Location: Nasal bridge  Laceration Length: 3cm  No Foreign Bodies seen or palpated  Anesthesia: local infiltration  Local anesthetic: lidocaine 1% c epinephrine  Anesthetic total: 3 ml  Irrigation method: syringe Amount of cleaning: standard  Skin closure: 4-0 nylon  Number of sutures: 6  Technique: simple  Patient tolerance: Patient tolerated the procedure well with no immediate complications.   CT of head negative. CT of maxilla show comminuted nasal fracture. No other abnormalities. Normal sinuses and orbits. X-ray and CT of hip obtained. X-ray suggests possible ramus fracture. CT of pelvis negative.  Compressive gauze placed to the eye. After laying supine per several hours woke bleeding studies patient has some slight hypoxemia here of 86%. We'll set her up right and try spirometry. I have arranged for home health care nursing checks for her for wound care. Family will be able to stay with her over the next several days. Prescription for Keflex. Given IV Ancef prior to laceration repair.  ENT follow-up.  Final Clinical Impressions(s) / ED Diagnoses   Final diagnoses:  Fall, initial encounter  Open fracture of nasal bone, initial encounter   Patient had been on  nasal cannula O2 while in the department. When preparing for discharge was noted to be 82 day before percent on room air. We have tried sitting upright. We've had her working with incentive spirometry. Her lungs remain clear. She is in a regular rhythm.  May require admission for hypoxemia. Plan EKG, chest x-ray, basic labs.   New Prescriptions New Prescriptions   CEPHALEXIN (KEFLEX) 500 MG CAPSULE    Take 1 capsule (500 mg total) by mouth 4 (four) times daily.   TRAMADOL (ULTRAM) 50 MG TABLET    Take 1 tablet (50 mg total) by mouth every 6 (six) hours as needed.     Tanna Furry, MD 09/05/16 Bunnie Pion    Tanna Furry, MD 09/06/16 [phone]

## 2016-09-06 ENCOUNTER — Emergency Department (HOSPITAL_COMMUNITY): Payer: Medicare Other

## 2016-09-06 DIAGNOSIS — E785 Hyperlipidemia, unspecified: Secondary | ICD-10-CM | POA: Diagnosis not present

## 2016-09-06 DIAGNOSIS — J9601 Acute respiratory failure with hypoxia: Secondary | ICD-10-CM

## 2016-09-06 DIAGNOSIS — W19XXXA Unspecified fall, initial encounter: Secondary | ICD-10-CM

## 2016-09-06 DIAGNOSIS — E118 Type 2 diabetes mellitus with unspecified complications: Secondary | ICD-10-CM

## 2016-09-06 DIAGNOSIS — K219 Gastro-esophageal reflux disease without esophagitis: Secondary | ICD-10-CM | POA: Diagnosis not present

## 2016-09-06 DIAGNOSIS — S022XXB Fracture of nasal bones, initial encounter for open fracture: Secondary | ICD-10-CM | POA: Diagnosis not present

## 2016-09-06 DIAGNOSIS — I2510 Atherosclerotic heart disease of native coronary artery without angina pectoris: Secondary | ICD-10-CM

## 2016-09-06 DIAGNOSIS — J849 Interstitial pulmonary disease, unspecified: Secondary | ICD-10-CM

## 2016-09-06 DIAGNOSIS — I10 Essential (primary) hypertension: Secondary | ICD-10-CM | POA: Diagnosis not present

## 2016-09-06 DIAGNOSIS — J9621 Acute and chronic respiratory failure with hypoxia: Secondary | ICD-10-CM

## 2016-09-06 DIAGNOSIS — I5032 Chronic diastolic (congestive) heart failure: Secondary | ICD-10-CM

## 2016-09-06 DIAGNOSIS — I480 Paroxysmal atrial fibrillation: Secondary | ICD-10-CM

## 2016-09-06 HISTORY — DX: Unspecified fall, initial encounter: W19.XXXA

## 2016-09-06 LAB — CBC WITH DIFFERENTIAL/PLATELET
Basophils Absolute: 0 10*3/uL (ref 0.0–0.1)
Basophils Relative: 0 %
Eosinophils Absolute: 0 10*3/uL (ref 0.0–0.7)
Eosinophils Relative: 0 %
HEMATOCRIT: 31.8 % — AB (ref 36.0–46.0)
HEMOGLOBIN: 10.2 g/dL — AB (ref 12.0–15.0)
LYMPHS ABS: 1.5 10*3/uL (ref 0.7–4.0)
Lymphocytes Relative: 13 %
MCH: 31.3 pg (ref 26.0–34.0)
MCHC: 32.1 g/dL (ref 30.0–36.0)
MCV: 97.5 fL (ref 78.0–100.0)
MONOS PCT: 7 %
Monocytes Absolute: 0.8 10*3/uL (ref 0.1–1.0)
NEUTROS ABS: 9.1 10*3/uL — AB (ref 1.7–7.7)
NEUTROS PCT: 80 %
Platelets: 156 10*3/uL (ref 150–400)
RBC: 3.26 MIL/uL — ABNORMAL LOW (ref 3.87–5.11)
RDW: 13.8 % (ref 11.5–15.5)
WBC: 11.3 10*3/uL — ABNORMAL HIGH (ref 4.0–10.5)

## 2016-09-06 LAB — CBC
HCT: 31.3 % — ABNORMAL LOW (ref 36.0–46.0)
HEMOGLOBIN: 10.1 g/dL — AB (ref 12.0–15.0)
MCH: 31.1 pg (ref 26.0–34.0)
MCHC: 32.3 g/dL (ref 30.0–36.0)
MCV: 96.3 fL (ref 78.0–100.0)
Platelets: 147 10*3/uL — ABNORMAL LOW (ref 150–400)
RBC: 3.25 MIL/uL — AB (ref 3.87–5.11)
RDW: 13.6 % (ref 11.5–15.5)
WBC: 10.7 10*3/uL — ABNORMAL HIGH (ref 4.0–10.5)

## 2016-09-06 LAB — BASIC METABOLIC PANEL
ANION GAP: 7 (ref 5–15)
Anion gap: 10 (ref 5–15)
BUN: 24 mg/dL — ABNORMAL HIGH (ref 6–20)
BUN: 27 mg/dL — AB (ref 6–20)
CALCIUM: 8.5 mg/dL — AB (ref 8.9–10.3)
CALCIUM: 8.6 mg/dL — AB (ref 8.9–10.3)
CHLORIDE: 105 mmol/L (ref 101–111)
CO2: 21 mmol/L — AB (ref 22–32)
CO2: 23 mmol/L (ref 22–32)
CREATININE: 1.06 mg/dL — AB (ref 0.44–1.00)
Chloride: 106 mmol/L (ref 101–111)
Creatinine, Ser: 1.08 mg/dL — ABNORMAL HIGH (ref 0.44–1.00)
GFR calc Af Amer: 50 mL/min — ABNORMAL LOW (ref >60)
GFR calc non Af Amer: 44 mL/min — ABNORMAL LOW (ref >60)
GFR calc non Af Amer: 43 mL/min — ABNORMAL LOW (ref >60)
GFR, EST AFRICAN AMERICAN: 49 mL/min — AB (ref >60)
GLUCOSE: 171 mg/dL — AB (ref 65–99)
Glucose, Bld: 213 mg/dL — ABNORMAL HIGH (ref 65–99)
Potassium: 5.3 mmol/L — ABNORMAL HIGH (ref 3.5–5.1)
Potassium: 4.5 mmol/L (ref 3.5–5.1)
Sodium: 136 mmol/L (ref 135–145)
Sodium: 136 mmol/L (ref 135–145)

## 2016-09-06 LAB — GLUCOSE, CAPILLARY: GLUCOSE-CAPILLARY: 182 mg/dL — AB (ref 65–99)

## 2016-09-06 LAB — BRAIN NATRIURETIC PEPTIDE: B NATRIURETIC PEPTIDE 5: 364.9 pg/mL — AB (ref 0.0–100.0)

## 2016-09-06 MED ORDER — BACITRACIN-NEOMYCIN-POLYMYXIN OINTMENT TUBE: 1.0000 "application " | TOPICAL_OINTMENT | Freq: Every day | CUTANEOUS | Status: DC

## 2016-09-06 MED ORDER — SENNOSIDES-DOCUSATE SODIUM 8.6-50 MG PO TABS: 1.0000 | ORAL_TABLET | Freq: Every evening | ORAL | Status: DC | PRN

## 2016-09-06 MED ORDER — OXYCODONE-ACETAMINOPHEN 5-325 MG PO TABS
1.0000 | ORAL_TABLET | ORAL | Status: DC | PRN
  Administered 2016-09-06: 1 via ORAL
  Filled 2016-09-06: qty 1

## 2016-09-06 MED ORDER — TRAMADOL HCL 50 MG PO TABS: 50.0000 mg | ORAL_TABLET | Freq: Two times a day (BID) | ORAL | 0 refills | Status: DC | PRN

## 2016-09-06 MED ORDER — DOCUSATE SODIUM 100 MG PO CAPS
100.0000 mg | ORAL_CAPSULE | Freq: Two times a day (BID) | ORAL | Status: DC
  Administered 2016-09-06 – 2016-09-07 (×4): 100 mg via ORAL
  Filled 2016-09-06 (×4): qty 1

## 2016-09-06 MED ORDER — ALBUTEROL SULFATE (2.5 MG/3ML) 0.083% IN NEBU: 2.5000 mg | INHALATION_SOLUTION | RESPIRATORY_TRACT | Status: DC | PRN

## 2016-09-06 MED ORDER — ONDANSETRON HCL 4 MG/2ML IJ SOLN: 4.0000 mg | Freq: Three times a day (TID) | INTRAMUSCULAR | Status: DC | PRN

## 2016-09-06 MED ORDER — IPRATROPIUM-ALBUTEROL 0.5-2.5 (3) MG/3ML IN SOLN
3.0000 mL | Freq: Two times a day (BID) | RESPIRATORY_TRACT | Status: DC
  Administered 2016-09-06 – 2016-09-07 (×2): 3 mL via RESPIRATORY_TRACT
  Filled 2016-09-06 (×2): qty 3

## 2016-09-06 MED ORDER — SODIUM CHLORIDE 0.9 % IV SOLN
INTRAVENOUS | Status: DC
  Administered 2016-09-06: 03:00:00 via INTRAVENOUS

## 2016-09-06 MED ORDER — IPRATROPIUM-ALBUTEROL 0.5-2.5 (3) MG/3ML IN SOLN: 3.0000 mL | Freq: Two times a day (BID) | RESPIRATORY_TRACT | Status: DC

## 2016-09-06 MED ORDER — VITAMIN C 500 MG PO TABS
250.0000 mg | ORAL_TABLET | Freq: Every day | ORAL | Status: DC
  Administered 2016-09-06 – 2016-09-07 (×2): 250 mg via ORAL
  Filled 2016-09-06 (×2): qty 1

## 2016-09-06 MED ORDER — OMEGA-3-ACID ETHYL ESTERS 1 G PO CAPS
1.0000 g | ORAL_CAPSULE | Freq: Every day | ORAL | Status: DC
Start: 2016-09-06 — End: 2016-09-07
  Administered 2016-09-06 – 2016-09-07 (×2): 1 g via ORAL
  Filled 2016-09-06 (×2): qty 1

## 2016-09-06 MED ORDER — ZOLPIDEM TARTRATE 5 MG PO TABS: 5.0000 mg | ORAL_TABLET | Freq: Every evening | ORAL | Status: DC | PRN

## 2016-09-06 MED ORDER — CEPHALEXIN 500 MG PO CAPS: 500.0000 mg | ORAL_CAPSULE | Freq: Four times a day (QID) | ORAL | 0 refills | Status: DC

## 2016-09-06 MED ORDER — GABAPENTIN 100 MG PO CAPS: 100.0000 mg | ORAL_CAPSULE | Freq: Every day | ORAL | Status: DC | PRN

## 2016-09-06 MED ORDER — HYDRALAZINE HCL 20 MG/ML IJ SOLN: 5.0000 mg | INTRAMUSCULAR | Status: DC | PRN

## 2016-09-06 MED ORDER — ACETAMINOPHEN 325 MG PO TABS: 650.0000 mg | ORAL_TABLET | Freq: Four times a day (QID) | ORAL | Status: DC | PRN

## 2016-09-06 MED ORDER — VITAMIN B-12 100 MCG PO TABS
100.0000 ug | ORAL_TABLET | Freq: Every day | ORAL | Status: DC
  Administered 2016-09-06 – 2016-09-07 (×2): 100 ug via ORAL
  Filled 2016-09-06 (×2): qty 1

## 2016-09-06 MED ORDER — IPRATROPIUM-ALBUTEROL 0.5-2.5 (3) MG/3ML IN SOLN
3.0000 mL | Freq: Three times a day (TID) | RESPIRATORY_TRACT | Status: DC
  Administered 2016-09-06: 3 mL via RESPIRATORY_TRACT
  Filled 2016-09-06: qty 3

## 2016-09-06 MED ORDER — PANTOPRAZOLE SODIUM 20 MG PO TBEC
20.0000 mg | DELAYED_RELEASE_TABLET | ORAL | Status: DC
  Administered 2016-09-07: 20 mg via ORAL
  Filled 2016-09-06: qty 1

## 2016-09-06 MED ORDER — VITAMIN E 45 MG (100 UNIT) PO CAPS
100.0000 [IU] | ORAL_CAPSULE | Freq: Every day | ORAL | Status: DC
  Administered 2016-09-06 – 2016-09-07 (×2): 100 [IU] via ORAL
  Filled 2016-09-06 (×2): qty 1

## 2016-09-06 MED ORDER — ATORVASTATIN CALCIUM 20 MG PO TABS
20.0000 mg | ORAL_TABLET | Freq: Every day | ORAL | Status: DC
  Administered 2016-09-06: 20 mg via ORAL
  Filled 2016-09-06: qty 1

## 2016-09-06 MED ORDER — DM-GUAIFENESIN ER 30-600 MG PO TB12: 1.0000 | ORAL_TABLET | Freq: Two times a day (BID) | ORAL | Status: DC | PRN

## 2016-09-06 MED ORDER — CALCIUM CARBONATE ANTACID 500 MG PO CHEW
500.0000 mg | CHEWABLE_TABLET | Freq: Every day | ORAL | Status: DC
  Administered 2016-09-06 – 2016-09-07 (×2): 500 mg via ORAL
  Filled 2016-09-06: qty 3

## 2016-09-06 MED ORDER — METOPROLOL TARTRATE 12.5 MG HALF TABLET
12.5000 mg | ORAL_TABLET | Freq: Two times a day (BID) | ORAL | Status: DC
  Administered 2016-09-06 – 2016-09-07 (×4): 12.5 mg via ORAL
  Filled 2016-09-06 (×4): qty 1

## 2016-09-06 MED ORDER — IPRATROPIUM-ALBUTEROL 0.5-2.5 (3) MG/3ML IN SOLN
RESPIRATORY_TRACT | Status: AC
  Filled 2016-09-06: qty 3

## 2016-09-06 MED ORDER — ADULT MULTIVITAMIN W/MINERALS CH
1.0000 | ORAL_TABLET | Freq: Every day | ORAL | Status: DC
  Administered 2016-09-06 – 2016-09-07 (×2): 1 via ORAL
  Filled 2016-09-06 (×2): qty 1

## 2016-09-06 MED ORDER — VITAMIN D 1000 UNITS PO TABS
1000.0000 [IU] | ORAL_TABLET | Freq: Every day | ORAL | Status: DC
  Administered 2016-09-06 – 2016-09-07 (×2): 1000 [IU] via ORAL
  Filled 2016-09-06 (×2): qty 1

## 2016-09-06 MED ORDER — BACITRACIN-NEOMYCIN-POLYMYXIN OINTMENT TUBE
TOPICAL_OINTMENT | Freq: Every day | CUTANEOUS | Status: DC
  Administered 2016-09-06 – 2016-09-07 (×2): via TOPICAL
  Filled 2016-09-06: qty 14.17

## 2016-09-06 MED ORDER — IPRATROPIUM-ALBUTEROL 0.5-2.5 (3) MG/3ML IN SOLN
3.0000 mL | RESPIRATORY_TRACT | Status: DC
  Administered 2016-09-06 (×2): 3 mL via RESPIRATORY_TRACT
  Administered 2016-09-06: 05:00:00 via RESPIRATORY_TRACT
  Filled 2016-09-06: qty 3

## 2016-09-06 NOTE — Care Management Note (Addendum)
Case Management Note  Patient Details  Name: [name] MRN: [id number] Date of Birth: September 04, 1920  Subjective/Objective:                  Presents following a fall,   history of hypertension, hyperlipidemia, diet-controlled diabetes, interstitial lung disease, stroke, TIA, GERD, atrial fibrillation ,CAD, dCHF, carotid artery stenosis. Pt lives alone , however son will be with pt x 1 week once d/c.   Timmothy Sours ([address] [place]) [phone]    PCP; Carolann Littler  Action/Plan: Plan is to d/c to home today.  Expected Discharge Date:  09/06/16               Expected Discharge Plan:  [place]  In-House Referral:     Discharge planning Services  CM Consult  Post Acute Care Choice:    Choice offered to:  Patient  DME Arranged:  [facility] D[facility] Agency:  [place]:  RN, PT, Nurse's Aide, Social Work [facility] Agency:  [facility]  Status of Service:  Completed, signed off  If discussed at [employer], dates discussed:    Additional Comments:  Sharin Mons, RN 09/06/2016, 12:49 PM

## 2016-09-06 NOTE — Evaluation (Signed)
Physical Therapy Evaluation Patient Details Name: [name] MRN: [id number] DOB: [birth year] Today's Date: 09/06/2016   History of Present Illness  [name] is a 83 y.o. female with medical history significant of hypertension, hyperlipidemia, diet-controlled diabetes, interstitial lung disease, stroke, TIA, GERD, atrial fibrillation not on anticoagulants, left bundle located, CAD, dCHF, carotid artery stenosis, who presents with fall,and right hip pain.  Clinical Impression  Pt presents with the above diagnosis and below deficits for therapy evaluation. Prior to admission, pt lived alone in a single level home with a stair lift leading from her garage to her home. Pt does not have caregiver assistance at discharge and mentioned wanting to hire help at home. Due to current mobility deficits, poor activity tolerance and need for assistance, pt will benefit from CIR at discharge in order to maximize her outcomes prior to discharge. Pt will benefit from continued acute PT follow-up in order to address the below deficits prior to discharge.     Follow Up Recommendations CIR    Equipment Recommendations  Rolling walker with 5" wheels;3in1 (PT)    Recommendations for Other Services Rehab consult     Precautions / Restrictions Precautions Precautions: Fall Restrictions Weight Bearing Restrictions: No      Mobility  Bed Mobility               General bed mobility comments: pt up in recliner upon arrival  Transfers Overall transfer level: Needs assistance Equipment used: Rolling walker (2 wheeled) Transfers: Sit to/from Stand Sit to Stand: Min assist;Min guard         General transfer comment: Pt initially stands with min A, had to sit back down in recliner after first sit - stand from recliner. O2 SATs decreased to 71-73% on RA  Ambulation/Gait Ambulation/Gait assistance: Min assist;+2 safety/equipment Ambulation Distance (Feet): 40 Feet (20x2) Assistive device:  Rolling walker (2 wheeled) Gait Pattern/deviations: Step-through pattern;Decreased step length - right;Decreased step length - left Gait velocity: decreased Gait velocity interpretation: Below normal speed for age/gender General Gait Details: slow gait speed, increased dyspnea with gait and O2 sats drop to 88% on 4L. Cues for proximity to RW and for negotiating around obstacles due to blurred vision.   Stairs            Wheelchair Mobility    Modified Rankin (Stroke Patients Only)       Balance Overall balance assessment: History of Falls;Needs assistance Sitting-balance support: No upper extremity supported;Feet supported Sitting balance-Leahy Scale: Good     Standing balance support: Bilateral upper extremity supported;During functional activity Standing balance-Leahy Scale: Poor Standing balance comment: reliant on UE support to stabilize in standing                             Pertinent Vitals/Pain Pain Assessment: Faces Faces Pain Scale: Hurts a little bit Pain Location: reports feeling "sore all over" Pain Descriptors / Indicators: Sore Pain Intervention(s): Monitored during session;Limited activity within patient's tolerance;Repositioned    Home Living Family/patient expects to be discharged to:: Private residence Living Arrangements: Alone Available Help at Discharge: Friend(s);Available PRN/intermittently Type of Home: House Home Access: Level entry;Other (comment) (has stair lift at garage steps)     Home Layout: Two level;Able to live on main level with bedroom/bathroom Home Equipment: Wheelchair - power;Grab bars - toilet      Prior Function Level of Independence: Independent         Comments: Drives, plays bridge  multiple times each week.      Hand Dominance   Dominant Hand: Right    Extremity/Trunk Assessment   Upper Extremity Assessment Upper Extremity Assessment: Defer to OT evaluation    Lower Extremity  Assessment Lower Extremity Assessment: Generalized weakness    Cervical / Trunk Assessment Cervical / Trunk Assessment: Normal  Communication   Communication: No difficulties  Cognition Arousal/Alertness: Awake/alert Behavior During Therapy: WFL for tasks assessed/performed Overall Cognitive Status: Within Functional Limits for tasks assessed                                        General Comments General comments (skin integrity, edema, etc.): Sats are well below 90% on RA at rest and take about 2-3 minutes to return to normal on 4L. Sats 88% at completion of session after walking to bathroom, performing hygiene and walking back to recliner. Pt reports increased fatigue with mobiltiy.    Exercises     Assessment/Plan    PT Assessment Patient needs continued PT services  PT Problem List Decreased strength;Decreased activity tolerance;Decreased balance;Decreased mobility;Decreased knowledge of use of DME;Decreased safety awareness       PT Treatment Interventions DME instruction;Gait training;Stair training;Functional mobility training;Therapeutic activities;Therapeutic exercise;Balance training;Patient/family education    PT Goals (Current goals can be found in the Care Plan section)  Acute Rehab PT Goals Patient Stated Goal: get better PT Goal Formulation: With patient Time For Goal Achievement: 09/20/16 Potential to Achieve Goals: Good    Frequency Min 3X/week   Barriers to discharge Decreased caregiver support will need 24 hr caregiver assistance at discharge    Co-evaluation PT/OT/SLP Co-Evaluation/Treatment: Yes Reason for Co-Treatment: For patient/therapist safety;To address functional/ADL transfers PT goals addressed during session: Mobility/safety with mobility OT goals addressed during session: ADL's and self-care       AM-PAC PT "6 Clicks" Daily Activity  Outcome Measure Difficulty turning over in bed (including adjusting bedclothes,  sheets and blankets)?: Total Difficulty moving from lying on back to sitting on the side of the bed? : Total Difficulty sitting down on and standing up from a chair with arms (e.g., wheelchair, bedside commode, etc,.)?: Total Help needed moving to and from a bed to chair (including a wheelchair)?: A Lot Help needed walking in hospital room?: A Lot Help needed climbing 3-5 steps with a railing? : A Lot 6 Click Score: 9    End of Session Equipment Utilized During Treatment: Gait belt;Oxygen Activity Tolerance: Patient limited by fatigue Patient left: in chair;with call bell/phone within reach Nurse Communication: Mobility status PT Visit Diagnosis: Unsteadiness on feet (R26.81);Muscle weakness (generalized) (M62.81);Difficulty in walking, not elsewhere classified (R26.2)    Time: 0918-1000 PT Time Calculation (min) (ACUTE ONLY): 42 min   Charges:   PT Evaluation $PT Eval Moderate Complexity: 1 Procedure PT Treatments $Therapeutic Activity: 8-22 mins   PT G Codes:   PT G-Codes **NOT FOR INPATIENT CLASS** Functional Assessment Tool Used: AM-PAC 6 Clicks Basic Mobility;Clinical judgement Functional Limitation: Mobility: Walking and moving around Mobility: Walking and Moving Around Current Status (L3734): At least 60 percent but less than 80 percent impaired, limited or restricted Mobility: Walking and Moving Around Goal Status [phone]): At least 1 percent but less than 20 percent impaired, limited or restricted    Scheryl Marten PT, DPT  [phone]   Shanon Rosser 09/06/2016, 1:07 PM

## 2016-09-06 NOTE — NC FL2 (Signed)
[place] MEDICAID FL2 LEVEL OF CARE SCREENING TOOL     IDENTIFICATION  Patient Name: [name] Birthdate: Apr 01, 1920 Sex: female Admission Date (Current Location): 09/05/2016  [facility] and [place] Number:  Herbalist and Address:  [facility] [place], [place] [address], [place], [place] [address]      Provider Number: [id number]  Attending Physician Name and Address:  Debbe Odea, MD  Relative Name and Phone Number:       Current Level of Care: Hospital Recommended Level of Care: [place] Prior Approval Number:    Date Approved/Denied:   PASRR Number: [id number]  Discharge Plan: SNF    Current Diagnoses: Patient Active Problem List   Diagnosis Date Noted  . Acute on chronic respiratory failure with hypoxia ([place]) 09/06/2016  . Fall 09/06/2016  . Chronic diastolic (congestive) heart failure ([place]) 09/06/2016  . Open fracture of nasal bones   . Traumatic subarachnoid bleed with LOC of 30 minutes or less, initial encounter ([place]) 06/03/2016  . ILD (interstitial lung disease) ([place]) 04/06/2015  . Cough 04/06/2015  . Shortness of breath 03/15/2015  . Dyspnea 03/15/2015  . SOB (shortness of breath) 03/09/2015  . Perennial allergic rhinitis 01/17/2014  . Transient alteration of awareness 11/04/2013  . Syncope 10/15/2013  . Chronic insomnia 06/30/2013  . Aftercare following surgery of the circulatory system, [place] 06/22/2013  . Seasonal allergies 06/08/2013  . Normocytic anemia 10/29/2012  . Benign paroxysmal positional vertigo 06/16/2012  . Vertigo 03/23/2012  . Carotid artery disease ([place]) 10/18/2010  . Amaurosis fugax 10/18/2010  . Palpitations 06/14/2010  . KNEE PAIN 01/24/2010  . COLONIC POLYPS, ADENOMATOUS 12/26/2009  . Internal and external bleeding hemorrhoids 12/26/2009  . CONSTIPATION 12/26/2009  . ANEMIA, NORMOCYTIC 11/30/2009  . FATIGUE / MALAISE 11/27/2009  . PAF (paroxysmal atrial fibrillation) ([place])  04/12/2009  . COLONIC POLYPS, HYPERPLASTIC, HX OF 03/01/2009  . CAD (coronary artery disease) 04/27/2008  . GERD 02/16/2007  . Hyperlipidemia 09/09/2006  . Essential hypertension 09/09/2006  . Transient cerebral ischemia 09/02/2006  . Type 2 diabetes mellitus, controlled ([place]) 08/11/2006  . LEFT BUNDLE BRANCH BLOCK 08/11/2006  . DIVERTICULOSIS, COLON 08/11/2006  . Osteoarthritis 08/11/2006  . OSTEOPOROSIS 08/11/2006    Orientation RESPIRATION BLADDER Height & Weight     Self, Time, Situation, Place  O2 (Nasal cannula 4L) Continent Weight: 59.9 kg (132 lb) Height:  5\' 6"  (167.6 cm)  BEHAVIORAL SYMPTOMS/MOOD NEUROLOGICAL BOWEL NUTRITION STATUS      Continent Diet (Please see DC Summary)  AMBULATORY STATUS COMMUNICATION OF NEEDS Skin   Limited Assist Verbally Normal                       Personal Care Assistance Level of Assistance  Bathing, Feeding, Dressing Bathing Assistance: Limited assistance Feeding assistance: Independent Dressing Assistance: Limited assistance     Functional Limitations Info             SPECIAL CARE FACTORS FREQUENCY  PT (By licensed PT)                    Contractures      Additional Factors Info  Code Status, Allergies Code Status Info: DNR Allergies Info: Nitrofurantoin, Sulfamethoxazole-trimethoprim, Sulfonamide Derivatives           Current Medications (09/06/2016):  This is the current hospital active medication list Current Facility-Administered Medications  Medication Dose Route Frequency Provider Last Rate Last Dose  . acetaminophen (TYLENOL) tablet 650  mg  650 mg Oral Q6H PRN Ivor Costa, MD      . albuterol (PROVENTIL) (2.5 MG/3ML) 0.083% nebulizer solution 2.5 mg  2.5 mg Nebulization Q4H PRN Ivor Costa, MD      . atorvastatin (LIPITOR) tablet 20 mg  20 mg Oral q1800 Ivor Costa, MD      . calcium carbonate (TUMS - dosed in mg elemental calcium) chewable tablet 500 mg  500 mg Oral Daily Ivor Costa, MD   500 mg at  09/06/16 1043  . cholecalciferol (VITAMIN D) tablet 1,000 Units  1,000 Units Oral Daily Ivor Costa, MD   1,000 Units at 09/06/16 1043  . dextromethorphan-guaiFENesin (MUCINEX DM) 30-600 MG per 12 hr tablet 1 tablet  1 tablet Oral BID PRN Ivor Costa, MD      . docusate sodium (COLACE) capsule 100 mg  100 mg Oral BID Ivor Costa, MD   100 mg at 09/06/16 1043  . gabapentin (NEURONTIN) capsule 100 mg  100 mg Oral Daily PRN Ivor Costa, MD      . hydrALAZINE (APRESOLINE) injection 5 mg  5 mg Intravenous Q2H PRN Ivor Costa, MD      . ipratropium-albuterol (DUONEB) 0.5-2.5 (3) MG/3ML nebulizer solution 3 mL  3 mL Nebulization BID Rizwan, Saima, MD      . metoprolol tartrate (LOPRESSOR) tablet 12.5 mg  12.5 mg Oral BID Ivor Costa, MD   12.5 mg at 09/06/16 1044  . multivitamin with minerals tablet 1 tablet  1 tablet Oral Daily Ivor Costa, MD   1 tablet at 09/06/16 1043  . neomycin-bacitracin-polymyxin (NEOSPORIN) ointment   Topical Daily Rizwan, Saima, MD      . omega-3 acid ethyl esters (LOVAZA) capsule 1 g  1 g Oral Daily Ivor Costa, MD   1 g at 09/06/16 1043  . ondansetron (ZOFRAN) injection 4 mg  4 mg Intravenous Q8H PRN Ivor Costa, MD      . oxyCODONE-acetaminophen (PERCOCET/ROXICET) 5-325 MG per tablet 1 tablet  1 tablet Oral Q4H PRN Ivor Costa, MD   1 tablet at 09/06/16 0445  . [START ON 09/07/2016] pantoprazole (PROTONIX) EC tablet 20 mg  20 mg Oral Henreitta Cea, MD      . senna-docusate (Senokot-S) tablet 1 tablet  1 tablet Oral QHS PRN Ivor Costa, MD      . vitamin B-12 (CYANOCOBALAMIN) tablet 100 mcg  100 mcg Oral Daily Ivor Costa, MD   100 mcg at 09/06/16 1043  . vitamin C (ASCORBIC ACID) tablet 250 mg  250 mg Oral Daily Ivor Costa, MD   250 mg at 09/06/16 1044  . vitamin E capsule 100 Units  100 Units Oral Daily Ivor Costa, MD   100 Units at 09/06/16 1042  . zolpidem (AMBIEN) tablet 5 mg  5 mg Oral QHS PRN Ivor Costa, MD         Discharge Medications: Please see discharge summary for a  list of discharge medications.  Relevant Imaging Results:  Relevant Lab Results:   Additional Information SSN: [place], [place]

## 2016-09-06 NOTE — ED Notes (Signed)
Portable xray at bedside.

## 2016-09-06 NOTE — Progress Notes (Signed)
Rehab Admissions Coordinator Note:  Patient was screened by Cleatrice Burke for appropriateness for an Inpatient Acute Rehab Consult per PT and OT  Recommendation. Noted pt observation status.  [employer] Medicare is doubtful they would approve CIR admit for this diagnosis and under observation status. We also do not have a bed to offer this patient today or through this weekend. Cleatrice Burke 09/06/2016, 1:13 PM  I can be reached at [phone].

## 2016-09-06 NOTE — Progress Notes (Signed)
SATURATION QUALIFICATIONS: (This note is used to comply with regulatory documentation for home oxygen)  Patient Saturations on Room Air at Rest = 90%  Patient Saturations on Room Air while Ambulating = 80%

## 2016-09-06 NOTE — ED Notes (Signed)
Despite working with incentive spirometer pt remains to have saturations in the mid to upper 80's.

## 2016-09-06 NOTE — Evaluation (Signed)
Occupational Therapy Evaluation Patient Details Name: [name] MRN: [id number] DOB: 1920/03/25 Today's Date: 09/06/2016    History of Present Illness [name] is a 83 y.o. female with medical history significant of hypertension, hyperlipidemia, diet-controlled diabetes, interstitial lung disease, stroke, TIA, GERD, atrial fibrillation not on anticoagulants, left bundle located, CAD, dCHF, carotid artery stenosis, who presents with fall,and right hip pain.   Clinical Impression   Pt with decline in function and safety with ADLs and ADL mobility with decreased strength, balance and endurance. Pt fatigues very easily with min - mod exertion with decreasing O2 SATs on RA and 4 L of O2. Upon arrival pt seated in recliner finishing her breakfast with O2 on. O2 removed and dropped to 71-73%. O2 put back on and pt required several minutes to recover to >88%. Pt required rest breaks during assisted ADL and toileting due to SOB and fatigue. Recommend CIR for further rehab after d/c from acute care for a safe d/c to home as pt lives at home alone and was independent, driving PTA    Follow Up Recommendations  CIR    Equipment Recommendations  Other (comment) (TBD at next venue of care)    Recommendations for Other Services Rehab consult     Precautions / Restrictions Precautions Precautions: Fall      Mobility Bed Mobility               General bed mobility comments: pt up in recliner upon arrival  Transfers Overall transfer level: Needs assistance Equipment used: Rolling walker (2 wheeled) Transfers: Sit to/from Stand Sit to Stand: Min assist;Min guard         General transfer comment: Pt initially stands with min A, had to sit back down in recliner after first sit - stand from recliner. O2 SATs decreased to 71-73% on RA    Balance Overall balance assessment: History of Falls                                         ADL either performed or  assessed with clinical judgement   ADL Overall ADL's : Needs assistance/impaired     Grooming: Wash/dry hands;Wash/dry face;Standing;Min guard   Upper Body Bathing: Minimal assistance;Standing   Lower Body Bathing: Moderate assistance   Upper Body Dressing : Minimal assistance;Standing   Lower Body Dressing: Moderate assistance   Toilet Transfer: Minimal assistance;Min guard;Ambulation;RW;Comfort height toilet;Grab bars   Toileting- Clothing Manipulation and Hygiene: Minimal assistance       Functional mobility during ADLs: Minimal assistance;Min guard;Rolling walker General ADL Comments: pt fatigues easily during ADL and ADL mobility tasks     Vision Baseline Vision/History: Wears glasses Wears Glasses: At all times Patient Visual Report: Blurring of vision (edema, redness around eyes)       Perception     Praxis      Pertinent Vitals/Pain Pain Location: reports feeling "sore all over" Pain Descriptors / Indicators: Sore Pain Intervention(s): Limited activity within patient's tolerance;Monitored during session;Repositioned     Hand Dominance Right   Extremity/Trunk Assessment Upper Extremity Assessment Upper Extremity Assessment: Generalized weakness   Lower Extremity Assessment Lower Extremity Assessment: Defer to PT evaluation       Communication Communication Communication: No difficulties   Cognition Arousal/Alertness: Awake/alert Behavior During Therapy: WFL for tasks assessed/performed Overall Cognitive Status: Within Functional Limits for tasks assessed  General Comments   Pt pleasant and cooperative               Home Living Family/patient expects to be discharged to:: Private residence Living Arrangements: Alone   Type of Home: House Home Access: Level entry;Other (comment) (has chair lift at garage steps)     Home Layout: Two level;Able to live on main level with  bedroom/bathroom Alternate Level Stairs-Number of Steps: 14 Alternate Level Stairs-Rails: Right;Left;Can reach both Bathroom Shower/Tub: Occupational psychologist: Handicapped height Bathroom Accessibility: Yes   Home Equipment: Wheelchair - power;Grab bars - toilet          Prior Functioning/Environment Level of Independence: Independent        Comments: Drives, plays bridge multiple times each week.         OT Problem List: Impaired balance (sitting and/or standing);Decreased strength;Pain;Decreased activity tolerance;Decreased knowledge of use of DME or AE;Cardiopulmonary status limiting activity      OT Treatment/Interventions: Self-care/ADL training;DME and/or AE instruction;Therapeutic activities;Therapeutic exercise;Patient/family education;Neuromuscular education    OT Goals(Current goals can be found in the care plan section) Acute Rehab OT Goals Patient Stated Goal: get better OT Goal Formulation: With patient Time For Goal Achievement: 09/13/16 Potential to Achieve Goals: Good ADL Goals Pt Will Perform Grooming: with supervision;with set-up;standing Pt Will Perform Upper Body Bathing: with supervision;with set-up;standing Pt Will Perform Lower Body Bathing: with min assist;sitting/lateral leans;sit to/from stand Pt Will Perform Upper Body Dressing: with supervision;with set-up;standing Pt Will Transfer to Toilet: with min guard assist;with supervision;ambulating;grab bars Pt Will Perform Toileting - Clothing Manipulation and hygiene: with min guard assist;sit to/from stand  OT Frequency: Min 2X/week   Barriers to D/C: Decreased caregiver support          Co-evaluation PT/OT/SLP Co-Evaluation/Treatment: Yes Reason for Co-Treatment: For patient/therapist safety   OT goals addressed during session: ADL's and self-care      AM-PAC PT "6 Clicks" Daily Activity     Outcome Measure Help from another person eating meals?: None Help from another  person taking care of personal grooming?: A Little Help from another person toileting, which includes using toliet, bedpan, or urinal?: A Little Help from another person bathing (including washing, rinsing, drying)?: A Lot Help from another person to put on and taking off regular upper body clothing?: A Little Help from another person to put on and taking off regular lower body clothing?: A Lot 6 Click Score: 17   End of Session Equipment Utilized During Treatment: Gait belt;Rolling walker;Other (comment) (3 in 1)  Activity Tolerance: Patient limited by fatigue (SOB, decreased O2 SATs) Patient left: in chair;with call bell/phone within reach  OT Visit Diagnosis: Other abnormalities of gait and mobility (R26.89);History of falling (Z91.81);Muscle weakness (generalized) (M62.81);Pain Pain - Right/Left:  ("all over") Pain - part of body:  ("all over")                Time: 0917-1001 OT Time Calculation (min): 44 min Charges:  OT General Charges $OT Visit: 1 Procedure OT Evaluation $OT Eval Moderate Complexity: 1 Procedure G-Codes: OT G-codes **NOT FOR INPATIENT CLASS** Functional Assessment Tool Used: AM-PAC 6 Clicks Daily Activity Functional Limitation: Self care Self Care Current Status (M0349): At least 40 percent but less than 60 percent impaired, limited or restricted Self Care Goal Status (Z7915): At least 20 percent but less than 40 percent impaired, limited or restricted     Britt Bottom 09/06/2016, 11:34 AM

## 2016-09-06 NOTE — Discharge Summary (Addendum)
Physician Discharge Summary  [name]:[id number] DOB: 1920/09/19 DOA: 09/05/2016  PCP: Eulas Post, MD  Admit date: 09/05/2016 Discharge date: 09/06/2016  Admitted From: home Disposition:  home   Recommendations for Outpatient Follow-up:  1. Please f/u on pulse ox in 1 wk- wean O2 as able 2. Sutures to be removed in 1 wk  Home Health:  ordered  Equipment/Devices:  Rolling walker    Discharge Condition:  stable   CODE STATUS:  DNR   Consultations:  none    Discharge Diagnoses:  Principal Problem:   Mechanical Fall Active Problems:   Acute on chronic respiratory failure with hypoxia (HCC)   Open fracture of nasal bones   Hyperlipidemia   Essential hypertension   CAD (coronary artery disease)   PAF (paroxysmal atrial fibrillation) (HCC)   GERD   ILD (interstitial lung disease) (HCC)   Chronic diastolic (congestive) heart failure (HCC)    Subjective: No significant pain in face at this time. No dyspnea at rest. No cough, nausea or vomiting.   Brief Summary: [name] is a 83 y.o. female with medical history significant of hypertension, hyperlipidemia, diet-controlled diabetes, interstitial lung disease, stroke, TIA, GERD, atrial fibrillation not on anticoagulants, left bundle located, CAD, dCHF, carotid artery stenosis who had a mechanical fall and hit a fence. She sustained multiple nasal fractures, nasal bridge laceration, left periorbital hematoma and left hip contusion. She was given Morphine in the ER and was noted to be hypoxic afterwards. She was admitted for this hypoxia.    Hospital Course:  Trauma related to fall - see imaging reports attached below - have notified facial surgery, Dr Mancel Parsons, and have been advised to tell the patient to see him in the office in 1 wk- I have discussed this with the patient - she will be discharged with 1 wk of Ancef - sutures will need to come out in 1 wk - she is advised to apply ice the swollen areas on her  face, use a walker and ensure she has someone with her in the home at all times, home health ordered as well  Hypoxia-  - continues to be hypoxic and is requring 4 L O 2 to stay > 90% - has h/o ILD - chest xray show chronic pulmonary fibrosis - nasal fracture may be contributing as well - will go home with portable O2 and will need to have pulse ox assessed in 1 wk  Discharge Instructions  Discharge Instructions    Diet - low sodium heart healthy    Complete by:  As directed    Diet Carb Modified    Complete by:  As directed    Discharge instructions    Complete by:  As directed    Needs a family member or friend to stay with her 24 hrs for at least 1 wk   Increase activity slowly    Complete by:  As directed      Allergies as of 09/06/2016      Reactions   Nitrofurantoin    unknown   Sulfamethoxazole-trimethoprim    unknown   Sulfonamide Derivatives    REACTION: rash      Medication List    TAKE these medications   acetaminophen 325 MG tablet Commonly known as:  TYLENOL Take 2 tablets (650 mg total) by mouth every 4 (four) hours as needed for mild pain (or temp > 37.5 C (99.5 F)).   aspirin EC 81 MG tablet Take 1 tablet (81  mg total) by mouth daily.   atorvastatin 20 MG tablet Commonly known as:  LIPITOR Take 1 tablet (20 mg total) by mouth daily at 6 PM.   calcium carbonate 200 MG capsule Take 200 mg by mouth daily.   cephALEXin 500 MG capsule Commonly known as:  KEFLEX Take 1 capsule (500 mg total) by mouth 4 (four) times daily.   docusate sodium 100 MG capsule Commonly known as:  COLACE Take 1 capsule (100 mg total) by mouth 2 (two) times daily.   fish oil-omega-3 fatty acids 1000 MG capsule Take 1 capsule by mouth daily.   gabapentin 100 MG capsule Commonly known as:  NEURONTIN Take 100 mg by mouth as needed for pain. Toe pain   glucose blood test strip Use once daily   ipratropium-albuterol 0.5-2.5 (3) MG/3ML Soln Commonly known as:   DUONEB Take 4 times a day and every 4 hours as needed for diff breathing   metoprolol tartrate 25 MG tablet Commonly known as:  LOPRESSOR TAKE ONE-HALF (1/2) TABLET TWICE A DAY   multivitamin with minerals tablet Take 1 tablet by mouth daily.   neomycin-bacitracin-polymyxin Oint Commonly known as:  NEOSPORIN Apply 1 application topically daily.   ONE TOUCH LANCETS Misc Use daily as directed   pantoprazole 20 MG tablet Commonly known as:  PROTONIX Take 1 tablet (20 mg total) by mouth every other day. PER PT 1/2 TAB QOD What changed:  how much to take  additional instructions   PROAIR RESPICLICK 854 (90 Base) MCG/ACT Aepb Generic drug:  Albuterol Sulfate Inhale 2 puffs into the lungs every 4 (four) hours as needed.   senna-docusate 8.6-50 MG tablet Commonly known as:  Senokot-S Take 1 tablet by mouth at bedtime as needed for mild constipation.   traMADol 50 MG tablet Commonly known as:  ULTRAM Take 1 tablet (50 mg total) by mouth every 12 (twelve) hours as needed for severe pain.   VITAMIN B 12 PO Take 1 tablet by mouth daily.   VITAMIN C PO Take 1 tablet by mouth daily.   VITAMIN D PO Take 1 tablet by mouth daily.   VITAMIN E PO Take 1 tablet by mouth daily.            Durable Medical Equipment        Start     Ordered   09/06/16 1244  For home use only DME Walker rolling  Once    Question:  Patient needs a walker to treat with the following condition  Answer:  Fall   09/06/16 1243   09/06/16 0838  For home use only DME oxygen  Once    Question Answer Comment  Mode or (Route) Nasal cannula   Liters per Minute 4   Frequency Continuous (stationary and portable oxygen unit needed)   Oxygen conserving device Yes   Oxygen delivery system Gas      09/06/16 0838     Follow-up Information    Drab, Larkin Ina, DMD Follow up.   Specialty:  Dentistry Contact information: [address] [place] [address] [phone]        Eulas Post, MD Follow up in 1 week(s).   Specialty:  Family Medicine Contact information: [place] [address] [place], [facility] Follow up.   Specialty:  [place] Why:  home health services arranged, office will call and set up home visits Contact information: [address] [place] [address] [id number]          Allergies  Allergen Reactions  . Nitrofurantoin     unknown  . Sulfamethoxazole-Trimethoprim     unknown  . Sulfonamide Derivatives     REACTION: rash     Procedures/Studies:    Dg Pelvis 1-2 Views  Result Date: 09/05/2016 CLINICAL DATA:  [place], right hip pain EXAM: PELVIS - 1-2 VIEW COMPARISON:  None. FINDINGS: Linear opacity in the right lower quadrant. SI joints are symmetric. Both femoral heads project in joint. No dislocation. Mild to moderate degenerative changes bilaterally. Questionable fracture of the right superior pubic ramus. Pubic symphysis is intact. IMPRESSION: 1. No dislocation 2. Questionable fracture involving the right superior pubic ramus. 3. Linear opacity overlies the upper aspect of right SI joint Electronically Signed   By: Donavan Foil M.D.   On: 09/05/2016 20:42   Dg Wrist Complete Right  Result Date: 09/05/2016 CLINICAL DATA:  Status post fall today with swelling and abrasion to the distal forearm complaining of wrist pain. EXAM: RIGHT WRIST - COMPLETE 3+ VIEW COMPARISON:  August 03, 2010 FINDINGS: There is no evidence of fracture or dislocation. Mild degenerative joint changes are identified. Chondrocalcinosis is noted. Soft tissues are unremarkable. IMPRESSION: No acute fracture or dislocation. Electronically Signed   By: Abelardo Diesel M.D.   On: 09/05/2016 17:27   Ct Head Wo Contrast  Result Date: 09/05/2016 CLINICAL DATA:  Fall EXAM: CT HEAD WITHOUT CONTRAST CT MAXILLOFACIAL WITHOUT CONTRAST TECHNIQUE: Multidetector CT imaging of the head and maxillofacial structures  were performed using the standard protocol without intravenous contrast. Multiplanar CT image reconstructions of the maxillofacial structures were also generated. COMPARISON:  06/03/2016, MRI 12/01/2013, 08/02/2016 FINDINGS: CT HEAD FINDINGS Brain: No acute territorial infarction, hemorrhage, or intracranial mass is seen. New focal hypodensity in the right cerebellum, probable old infarct but new since April 2018 CT. No surrounding mass effect. Moderate atrophy. Small vessel ischemic changes of the white matter. Stable ventricle size. Vascular: No hyperdense vessels.  Carotid artery calcifications. Skull: No depressed skull fracture is seen. Other: Large amount of left periorbital hematoma. CT MAXILLOFACIAL FINDINGS Osseous: Zygomatic arches are intact. Degenerative changes at the TMJ with flattening of the mandibular heads. No mandibular fracture is seen. Pterygoid plates are intact. Markedly comminuted, and displaced bilateral nasal bone fracture with involvement of the anterior nasal septum. Small comminuted fracture anterior nasal process of the maxillary bone. Orbits: Negative. No traumatic or inflammatory finding. Sinuses: No acute fluid levels. No sinus wall fracture. Mucosal thickening in the ethmoid sinuses. Soft tissues: Large left facial and periorbital hematoma with hematoma over the nasal area. Soft tissue swelling within the anterior nasal passage. IMPRESSION: 1. No definite CT evidence for acute intracranial abnormality. There is a focal hypodensity in the right cerebellum, suspected to represent an old infarct, but new since CT from April 2018. 2. Large amount of left facial and periorbital soft tissue hematoma. Severely comminuted, and displaced bilateral nasal bone fracture with comminuted fracture involving the anterior bony nasal septum. Small comminuted fracture involving the anterior nasal process of the maxillary bone. Negative for orbital fracture. Electronically Signed   By: Donavan Foil  M.D.   On: 09/05/2016 17:31   Ct Pelvis Wo Contrast  Result Date: 09/05/2016 CLINICAL DATA:  83 y/o F; status post fall with possible superior pubic ramus fracture on the right. EXAM: CT PELVIS WITHOUT CONTRAST TECHNIQUE: Multidetector CT imaging of the pelvis was performed following the  standard protocol without intravenous contrast. COMPARISON:  None. FINDINGS: Urinary Tract:  No abnormality visualized. Bowel: Extensive sigmoid diverticulosis. No evidence for acute diverticulitis. Vascular/Lymphatic: Calcific atherosclerosis of the abdominal aorta and bilateral iliofemoral arteries. Reproductive:  No mass or other significant abnormality Other:  None. Musculoskeletal: No acute fracture identified. Mild osteoarthrosis of the hip joints bilaterally with productive changes of the acetabular rims and femoral head neck junctions, greater on the right. Degenerative changes of the lower lumbar spine with prominent facet arthropathy. IMPRESSION: 1. No acute fracture or dislocation identified. 2. Chronic changes as above. Electronically Signed   By: Kristine Garbe M.D.   On: 09/05/2016 23:11   Dg Chest Port 1 View  Result Date: 09/06/2016 CLINICAL DATA:  Hypoxia EXAM: PORTABLE CHEST 1 VIEW COMPARISON:  06/03/2016 FINDINGS: Heart size and pulmonary vascularity are normal. A loop recorder is present. Shallow inspiration. Interstitial pattern to the lungs likely representing chronic fibrosis. Right apical calcification is likely postinflammatory. No consolidation in the lungs. No blunting of costophrenic angles. No pneumothorax. Calcified and tortuous aorta. IMPRESSION: Chronic pulmonary fibrosis. No evidence of active pulmonary disease. Electronically Signed   By: Lucienne Capers M.D.   On: 09/06/2016 01:19   Ct Maxillofacial Wo Contrast  Result Date: 09/05/2016 CLINICAL DATA:  Fall EXAM: CT HEAD WITHOUT CONTRAST CT MAXILLOFACIAL WITHOUT CONTRAST TECHNIQUE: Multidetector CT imaging of the head and  maxillofacial structures were performed using the standard protocol without intravenous contrast. Multiplanar CT image reconstructions of the maxillofacial structures were also generated. COMPARISON:  06/03/2016, MRI 12/01/2013, 08/02/2016 FINDINGS: CT HEAD FINDINGS Brain: No acute territorial infarction, hemorrhage, or intracranial mass is seen. New focal hypodensity in the right cerebellum, probable old infarct but new since April 2018 CT. No surrounding mass effect. Moderate atrophy. Small vessel ischemic changes of the white matter. Stable ventricle size. Vascular: No hyperdense vessels.  Carotid artery calcifications. Skull: No depressed skull fracture is seen. Other: Large amount of left periorbital hematoma. CT MAXILLOFACIAL FINDINGS Osseous: Zygomatic arches are intact. Degenerative changes at the TMJ with flattening of the mandibular heads. No mandibular fracture is seen. Pterygoid plates are intact. Markedly comminuted, and displaced bilateral nasal bone fracture with involvement of the anterior nasal septum. Small comminuted fracture anterior nasal process of the maxillary bone. Orbits: Negative. No traumatic or inflammatory finding. Sinuses: No acute fluid levels. No sinus wall fracture. Mucosal thickening in the ethmoid sinuses. Soft tissues: Large left facial and periorbital hematoma with hematoma over the nasal area. Soft tissue swelling within the anterior nasal passage. IMPRESSION: 1. No definite CT evidence for acute intracranial abnormality. There is a focal hypodensity in the right cerebellum, suspected to represent an old infarct, but new since CT from April 2018. 2. Large amount of left facial and periorbital soft tissue hematoma. Severely comminuted, and displaced bilateral nasal bone fracture with comminuted fracture involving the anterior bony nasal septum. Small comminuted fracture involving the anterior nasal process of the maxillary bone. Negative for orbital fracture. Electronically  Signed   By: Donavan Foil M.D.   On: 09/05/2016 17:31       Discharge Exam: Vitals:   09/06/16 0435 09/06/16 0607  BP:  (!) 150/60  Pulse: 89 76  Resp: 20 18  Temp:  97.8 F (36.6 C)   Vitals:   09/06/16 0245 09/06/16 0319 09/06/16 0435 09/06/16 0607  BP: (!) 175/69 (!) 179/65  (!) 150/60  Pulse: 84 90 89 76  Resp: (!) 23 18 20 18   Temp:  98.2 F (36.8 C)  97.8 F (36.6 C)  TempSrc:  Oral  Oral  SpO2:  96% 98% 97%  Weight:    59.9 kg (132 lb)  Height:    5\' 6"  (1.676 m)    General: Pt is alert, awake, not in acute distress Cardiovascular: RRR, S1/S2 +, no rubs, no gallops Respiratory: CTA bilaterally, no wheezing, no rhonchi Abdominal: Soft, NT, ND, bowel sounds + Extremities: no edema, no cyanosis    The results of significant diagnostics from this hospitalization (including imaging, microbiology, ancillary and laboratory) are listed below for reference.     Microbiology: No results found for this or any previous visit (from the past 240 hour(s)).   Labs: BNP (last 3 results)  Recent Labs  09/06/16 0428  BNP 573.2*   Basic Metabolic Panel:  Recent Labs Lab 09/06/16 0015 09/06/16 0428  NA 136 136  K 5.3* 4.5  CL 105 106  CO2 21* 23  GLUCOSE 171* 213*  BUN 27* 24*  CREATININE 1.06* 1.08*  CALCIUM 8.6* 8.5*   Liver Function Tests: No results for input(s): AST, ALT, ALKPHOS, BILITOT, PROT, ALBUMIN in the last 168 hours. No results for input(s): LIPASE, AMYLASE in the last 168 hours. No results for input(s): AMMONIA in the last 168 hours. CBC:  Recent Labs Lab 09/06/16 0015 09/06/16 0428  WBC 11.3* 10.7*  NEUTROABS 9.1*  --   HGB 10.2* 10.1*  HCT 31.8* 31.3*  MCV 97.5 96.3  PLT 156 147*   Cardiac Enzymes: No results for input(s): CKTOTAL, CKMB, CKMBINDEX, TROPONINI in the last 168 hours. BNP: Invalid input(s): POCBNP CBG:  Recent Labs Lab 09/06/16 0801  GLUCAP 182*   D-Dimer No results for input(s): DDIMER in the last 72  hours. Hgb A1c No results for input(s): HGBA1C in the last 72 hours. Lipid Profile No results for input(s): CHOL, HDL, LDLCALC, TRIG, CHOLHDL, LDLDIRECT in the last 72 hours. Thyroid function studies No results for input(s): TSH, T4TOTAL, T3FREE, THYROIDAB in the last 72 hours.  Invalid input(s): FREET3 Anemia work up No results for input(s): VITAMINB12, FOLATE, FERRITIN, TIBC, IRON, RETICCTPCT in the last 72 hours. Urinalysis    Component Value Date/Time   COLORURINE STRAW (A) 06/03/2016 0305   APPEARANCEUR CLEAR 06/03/2016 0305   LABSPEC 1.006 06/03/2016 0305   PHURINE 5.0 06/03/2016 0305   GLUCOSEU NEGATIVE 06/03/2016 0305   HGBUR NEGATIVE 06/03/2016 0305   BILIRUBINUR NEGATIVE 06/03/2016 0305   BILIRUBINUR n 11/29/2013 1621   KETONESUR NEGATIVE 06/03/2016 0305   PROTEINUR NEGATIVE 06/03/2016 0305   UROBILINOGEN 0.2 12/01/2013 1606   NITRITE NEGATIVE 06/03/2016 0305   LEUKOCYTESUR LARGE (A) 06/03/2016 0305   Sepsis Labs Invalid input(s): PROCALCITONIN,  WBC,  LACTICIDVEN Microbiology No results found for this or any previous visit (from the past 240 hour(s)).   Time coordinating discharge: Over 30 minutes  SIGNED:   Debbe Odea, MD  [facility] 09/06/2016, 2:25 PM Pager   If 7PM-7AM, please contact night-coverage [email or web address] Password [email or web address]

## 2016-09-06 NOTE — Discharge Instructions (Signed)
You will call Dr Mancel Parsons to be seen in 1 wk. Sutures will need to be removed. Ice to purple swollen areas three times per day. Tylenol and Ultram(Tramadol) for pain.  Please take all your medications with you for your next visit with your Primary MD. Please request your Primary MD to go over all hospital test results at the follow up. Please ask your Primary MD to get all Hospital records sent to his/her office.  If you experience worsening of your admission symptoms, develop shortness of breath, chest pain, suicidal or homicidal thoughts or a life threatening emergency, you must seek medical attention immediately by calling 911 or calling your MD.  [name] must read the complete instructions/literature along with all the possible adverse reactions/side effects for all the medicines you take including new medications that have been prescribed to you. Take new medicines after you have completely understood and accpet all the possible adverse reactions/side effects.   Do not drive when taking pain medications or sedatives.    Do not take more than prescribed Pain, Sleep and Anxiety Medications  If you have smoked or chewed Tobacco in the last 2 yrs please stop. Stop any regular alcohol and or recreational drug use.  Wear Seat belts while driving.

## 2016-09-06 NOTE — Consult Note (Signed)
WOC consult requested for topical treatment to nose.  EMR indicates this area was sutured in the ER. Apply neosporin to nose sutures Q day and leave open to air.  Sutures will need to be removed in 7-10 days.  No further role for WOCN. Please re-consult if further assistance is needed.  Thank-you,  Julien Girt MSN, [place], [place], [place], [place]

## 2016-09-06 NOTE — Care Management Obs Status (Signed)
[place] NOTIFICATION   Patient Details  Name: [name] MRN: [id number] Date of Birth: May 09, 1920   Medicare Observation Status Notification Given:  Yes    Sharin Mons, RN 09/06/2016, 4:47 PM

## 2016-09-06 NOTE — Progress Notes (Addendum)
Pt need oxygen, alternate treatments were tried but not successful. Ottavio Norem RN,BSN,CM 

## 2016-09-06 NOTE — ED Notes (Signed)
After morphine administration O2 sats dropped.  2L O2 nasal cannula applied.  Sats back at 97% on 2L

## 2016-09-06 NOTE — Progress Notes (Signed)
SATURATION QUALIFICATIONS: (This note is used to comply with regulatory documentation for home oxygen)  Patient Saturations on Room Air at Rest = 90%  Patient Saturations on Room Air while Ambulating = 80%  Patient Saturations on 4 Liters of oxygen while Ambulating = 93%  Please briefly explain why patient needs home oxygen: Patient got dizzy, when off oxygen and sats got to 80% and had to sit down. It also took 20-30 secs for o2 sats to get back to 94% when 4L oxygen applied.

## 2016-09-06 NOTE — Progress Notes (Signed)
CSW alerted by [facility] that family would like for patient to discharge to SNF instead of home. CSW completed referral to [facility] for transfer tomorrow.  Percell Locus Alecsander Hattabaugh LCSWA [phone]

## 2016-09-06 NOTE — H&P (Signed)
History and Physical    [name] KGY:[id number] DOB: November 16, 1920 DOA: 09/05/2016  Referring MD/NP/PA:   PCP: Eulas Post, MD   Patient coming from:  The patient is coming from home.  At baseline, pt is partially dependent for most of ADL.  Chief Complaint: fall, nose pain and right hip pain  HPI: [name] is a 83 y.o. female with medical history significant of hypertension, hyperlipidemia, diet-controlled diabetes, interstitial lung disease, stroke, TIA, GERD, atrial fibrillation not on anticoagulants, left bundle located, CAD, dCHF, carotid artery stenosis, who presents with fall,and right hip pain.  Pt states that she was helping her son pull a branch down, but fell and struck her face against a stationary wood fence at about 3:30 PM. No LOC. She has ecchymosis around her eyes (left eye is worse than the right), no vision change.  She has a laceration above her nose and bruises in right wrist. She has moderate pain in the right hip and right iliac crest, and severe pain in her nose. Patient does not have chest pain, shortness of breath, but she has mild cough with clear mucus production recently. Patient denies nausea, vomiting, diarrhea, abdominal pain, symptoms of UTI or unilateral weakness. Pt was noted to have oxygen desaturation to 84-86% on room air after she received one dose of morphine in ED, but she denies shortness of breath. No fever or chills.  ED Course: pt was found to have WBC 5.3 likely due to hemolysis, creatinine 1.06, WBC 11.3, temperature normal, no tachycardia. Patient is placed on telemetry bed for observation. Nose laceration was sutured up in ED physician.  # CT of head and maxillary facial 1. No definite CT evidence for acute intracranial abnormality. There is a focal hypodensity in the right cerebellum, suspected to represent an old infarct, but new since CT from April 2018. 2. Large amount of left facial and periorbital soft tissue hematoma.  Severely comminuted, and displaced bilateral nasal bone fracture with comminuted fracture involving the anterior bony nasal septum. Small comminuted fracture involving the anterior nasal process of the maxillary bone.  3. Negative for orbital fracture.  # CT-pelvis showed No acute fracture or dislocation.  # X-ray of right wrist is negative  # CXR showed Chronic pulmonary fibrosis. No evidence of active pulmonary disease.  Review of Systems:   General: no fevers, chills, no changes in body weight, has fatigue HEENT: no blurry vision, hearing changes or sore throat Respiratory: no dyspnea, has coughing, no wheezing CV: no chest pain, no palpitations GI: no nausea, vomiting, abdominal pain, diarrhea, constipation GU: no dysuria, burning on urination, increased urinary frequency, hematuria  Ext: no leg edema Neuro: no unilateral weakness, numbness, or tingling, no vision change or hearing loss. Had fall Skin: has ecchymosis in both eyes, bruise in right wrist, skin laceration in nose MSK: has right hip pain Heme: No easy bruising.  Travel history: No recent long distant travel.  Allergy:  Allergies  Allergen Reactions  . Nitrofurantoin     unknown  . Sulfamethoxazole-Trimethoprim     unknown  . Sulfonamide Derivatives     REACTION: rash    Past Medical History:  Diagnosis Date  . ANEMIA, NORMOCYTIC 11/30/2009  . CAD (coronary artery disease) 04/27/2008   Non-Obstructive >> Cardiac catheterization 6/08: EF 55%, 1+ MR, Dx 50%, LAD 50%, dLAD 40%, OM1 50-60% (up to 70%), pOM 30%, pRCA 40%, mRCA 50-60%, pPDA 50-60% (up to 70%).  Medical therapy was recommended // MV 11/14: Normal  stress nuclear study. LV Ejection Fraction: 67%.   . Carotid artery disease ([place])    Left CEA in 10/12.  Carotid dopplers (10/15) with 1-39% bilateral ICA stenosis. // Carotid US 10/17: R < 40; patent L CEA site  . COLONIC POLYPS, ADENOMATOUS 12/26/2009  . DIABETES MELLITUS, TYPE II 08/11/2006  .  DIVERTICULOSIS, COLON 08/11/2006  . GERD 02/16/2007  . Hemorrhoids    s/p banding  . History of echocardiogram    Echo 5/18: EF 60-65, no RWMA, Gr 2 DD, mild-mod MR, mod LAE // Echo 2/17: Mild LVH, mild focal basal septal hypertrophy, EF 55-60%, no RWMA, Gr 1 DD, MAC, mild MR, mild  // Echo 4/14: EF 60-65%, normal wall motion, Gr 1 DD, MAC, mild MR, PASP 32, reduced excursion of AV noncoronary cusp.    Marland Kitchen HYPERLIPIDEMIA 09/09/2006  . HYPERTENSION 09/09/2006  . LEFT BUNDLE BRANCH BLOCK 08/11/2006  . OSTEOARTHRITIS 08/11/2006  . OSTEOPOROSIS 08/11/2006  . PAF (paroxysmal atrial fibrillation) ([place]) 04/12/2009   Paroxysmal, only prior documented episode was years ago.  She was not started on anticoagulation back then due to hemorrhoidal bleeding, apparently profuse.  Event monitor (3/14) showed only NSR.    Marland Kitchen Stroke ([place]) 12-06-13  . Syncope    8/15. EEG (5/15) was unremarkable. // 4/18: ILR neg for arrhythmia - ? orthostatic (no prodrome reported) >> c/b SAH (small)   . TIA 09/02/2006   Echocardiogram 6/12: Mild to moderate MR, trace AI, trace TR, PASP 34, bubble study negative for intracardiac shunting, normal EF (greater than 55%) //  Possible TIA in 10/15 with visual field cut.  MRI (10/15) with no CVA. Linq monitor placed.    Past Surgical History:  Procedure Laterality Date  . CAROTID ENDARTERECTOMY Left 12-05-2010  . CLOSED REDUCTION METATARSAL FRACTURE     right 5th  . COLONOSCOPY W/ POLYPECTOMY  2001  . EYE SURGERY     catarac  . LOOP RECORDER IMPLANT N/A 01/03/2014   Procedure: LOOP RECORDER IMPLANT;  Surgeon: Evans Lance, MD;  Location[facility] CATH LAB;  Service: Cardiovascular;  Laterality: N/A;  . TONSILLECTOMY      Social History:  reports that she has never smoked. She has never used smokeless tobacco. She reports that she does not drink alcohol or use drugs.  Family History:  Family History  Problem Relation Age of Onset  . Heart disease Mother 24  . Kidney disease Mother     . Heart attack Mother   . Congestive Heart Failure Father 64  . Colon cancer Brother   . Heart disease Brother   . Breast cancer Sister   . Uterine cancer Sister      Prior to Admission medications   Medication Sig Start Date End Date Taking? Authorizing Provider  Ascorbic Acid (VITAMIN C PO) Take 1 tablet by mouth daily.    Yes [provider]  aspirin EC 81 MG tablet Take 1 tablet (81 mg total) by mouth daily. 07/05/16  Yes Weaver, Scott T, PA-C  atorvastatin (LIPITOR) 20 MG tablet Take 1 tablet (20 mg total) by mouth daily at 6 PM. 06/19/16  Yes Weaver, Nicki Reaper T, PA-C  calcium carbonate 200 MG capsule Take 200 mg by mouth daily.    Yes [provider]  Cholecalciferol (VITAMIN D PO) Take 1 tablet by mouth daily.    Yes [provider]  Cyanocobalamin (VITAMIN B 12 PO) Take 1 tablet by mouth daily.    Yes [provider]  docusate sodium (  COLACE) 100 MG capsule Take 1 capsule (100 mg total) by mouth 2 (two) times daily. 06/04/16  Yes Vann, Jessica U, DO  fish oil-omega-3 fatty acids 1000 MG capsule Take 1 capsule by mouth daily.    Yes [provider]  gabapentin (NEURONTIN) 100 MG capsule Take 100 mg by mouth as needed for pain. Toe pain 08/27/16  Yes [provider]  metoprolol tartrate (LOPRESSOR) 25 MG tablet TAKE ONE-HALF (1/2) TABLET TWICE A DAY 06/17/16  Yes Burchette, Alinda Sierras, MD  Multiple Vitamins-Minerals (MULTIVITAMIN WITH MINERALS) tablet Take 1 tablet by mouth daily.   Yes [provider]  pantoprazole (PROTONIX) 20 MG tablet Take 1 tablet (20 mg total) by mouth every other day. PER PT 1/2 TAB QOD Patient taking differently: Take 10 mg by mouth every other day. PER PT 1/2 TAB QOD 07/05/16  Yes Weaver, Scott T, PA-C  VITAMIN E PO Take 1 tablet by mouth daily.    Yes [provider]  acetaminophen (TYLENOL) 325 MG tablet Take 2 tablets (650 mg total) by mouth every 4 (four) hours as needed for mild pain (or temp >  37.5 C (99.5 F)). 06/04/16   Geradine Girt, DO  cephALEXin (KEFLEX) 500 MG capsule Take 1 capsule (500 mg total) by mouth 4 (four) times daily. 09/05/16   Tanna Furry, MD  glucose blood test strip Use once daily 12/01/14   Eulas Post, MD  ipratropium-albuterol (DUONEB) 0.5-2.5 (3) MG/3ML SOLN Take 4 times a day and every 4 hours as needed for diff breathing 04/11/15   de [place], [place], MD  ONE TOUCH LANCETS MISC Use daily as directed 03/25/11   Eulas Post, MD  PROAIR [address] [phone] Base) MCG/ACT AEPB Inhale 2 puffs into the lungs every 4 (four) hours as needed. 04/04/15   Burchette, Alinda Sierras, MD  traMADol (ULTRAM) 50 MG tablet Take 1 tablet (50 mg total) by mouth every 6 (six) hours as needed. 09/05/16   Tanna Furry, MD    Physical Exam: Vitals:   09/06/16 0230 09/06/16 0232 09/06/16 0245 09/06/16 0319  BP: (!) 180/69 (!) 180/69 (!) 175/69 (!) 179/65  Pulse: 78 81 84 90  Resp: 18  (!) 23 18  Temp:    98.2 F (36.8 C)  TempSrc:    Oral  SpO2:    96%   General: Not in acute distress HEENT:       Eyes: PERRL, EOMI, no scleral icterus.       ENT: No discharge from the ears and nose, no pharynx injection, no tonsillar enlargement.        Neck: No JVD, no bruit, no mass felt. Heme: No neck lymph node enlargement. Cardiac: S1/S2, RRR, No murmurs, No gallops or rubs. Respiratory:  No rales, wheezing, rhonchi or rubs. GI: Soft, nondistended, nontender, no rebound pain, no organomegaly, BS present. GU: No hematuria Ext: No pitting leg edema bilaterally. 2+DP/PT pulse bilaterally. Musculoskeletal: No joint deformities, No joint redness or warmth, no limitation of ROM in spin. Skin: has ecchymosis in both eyes (the left eye is worse than the right), bruise in right wrist, skin laceration in nose Neuro: Alert, oriented X3, cranial nerves II-XII grossly intact, moves all extremities normally. Psych: Patient is not psychotic, no suicidal or hemocidal ideation.  Labs on  Admission: I have personally reviewed following labs and imaging studies  CBC:  Recent Labs Lab 09/06/16 0015  WBC 11.3*  NEUTROABS 9.1*  HGB 10.2*  HCT 31.8*  MCV 97.5  PLT 725   Basic Metabolic Panel:  Recent Labs Lab 09/06/16 0015  NA 136  K 5.3*  CL 105  CO2 21*  GLUCOSE 171*  BUN 27*  CREATININE 1.06*  CALCIUM 8.6*   GFR: CrCl cannot be calculated (Unknown ideal weight.). Liver Function Tests: No results for input(s): AST, ALT, ALKPHOS, BILITOT, PROT, ALBUMIN in the last 168 hours. No results for input(s): LIPASE, AMYLASE in the last 168 hours. No results for input(s): AMMONIA in the last 168 hours. Coagulation Profile: No results for input(s): INR, PROTIME in the last 168 hours. Cardiac Enzymes: No results for input(s): CKTOTAL, CKMB, CKMBINDEX, TROPONINI in the last 168 hours. BNP (last 3 results) No results for input(s): PROBNP in the last 8760 hours. HbA1C: No results for input(s): HGBA1C in the last 72 hours. CBG: No results for input(s): GLUCAP in the last 168 hours. Lipid Profile: No results for input(s): CHOL, HDL, LDLCALC, TRIG, CHOLHDL, LDLDIRECT in the last 72 hours. Thyroid Function Tests: No results for input(s): TSH, T4TOTAL, FREET4, T3FREE, THYROIDAB in the last 72 hours. Anemia Panel: No results for input(s): VITAMINB12, FOLATE, FERRITIN, TIBC, IRON, RETICCTPCT in the last 72 hours. Urine analysis:    Component Value Date/Time   COLORURINE STRAW (A) 06/03/2016 0305   APPEARANCEUR CLEAR 06/03/2016 0305   LABSPEC 1.006 06/03/2016 0305   PHURINE 5.0 06/03/2016 0305   GLUCOSEU NEGATIVE 06/03/2016 0305   HGBUR NEGATIVE 06/03/2016 0305   BILIRUBINUR NEGATIVE 06/03/2016 0305   BILIRUBINUR n 11/29/2013 1621   KETONESUR NEGATIVE 06/03/2016 0305   PROTEINUR NEGATIVE 06/03/2016 0305   UROBILINOGEN 0.2 12/01/2013 1606   NITRITE NEGATIVE 06/03/2016 0305   LEUKOCYTESUR LARGE (A) 06/03/2016 0305   Sepsis  Labs: _0 (procalcitonin:4,lacticidven:4) )No results found for this or any previous visit (from the past 240 hour(s)).   Radiological Exams on Admission: Dg Pelvis 1-2 Views  Result Date: 09/05/2016 CLINICAL DATA:  [place], right hip pain EXAM: PELVIS - 1-2 VIEW COMPARISON:  None. FINDINGS: Linear opacity in the right lower quadrant. SI joints are symmetric. Both femoral heads project in joint. No dislocation. Mild to moderate degenerative changes bilaterally. Questionable fracture of the right superior pubic ramus. Pubic symphysis is intact. IMPRESSION: 1. No dislocation 2. Questionable fracture involving the right superior pubic ramus. 3. Linear opacity overlies the upper aspect of right SI joint Electronically Signed   By: Donavan Foil M.D.   On: 09/05/2016 20:42   Dg Wrist Complete Right  Result Date: 09/05/2016 CLINICAL DATA:  Status post fall today with swelling and abrasion to the distal forearm complaining of wrist pain. EXAM: RIGHT WRIST - COMPLETE 3+ VIEW COMPARISON:  August 03, 2010 FINDINGS: There is no evidence of fracture or dislocation. Mild degenerative joint changes are identified. Chondrocalcinosis is noted. Soft tissues are unremarkable. IMPRESSION: No acute fracture or dislocation. Electronically Signed   By: Abelardo Diesel M.D.   On: 09/05/2016 17:27   Ct Head Wo Contrast  Result Date: 09/05/2016 CLINICAL DATA:  Fall EXAM: CT HEAD WITHOUT CONTRAST CT MAXILLOFACIAL WITHOUT CONTRAST TECHNIQUE: Multidetector CT imaging of the head and maxillofacial structures were performed using the standard protocol without intravenous contrast. Multiplanar CT image reconstructions of the maxillofacial structures were also generated. COMPARISON:  06/03/2016, MRI 12/01/2013, 08/02/2016 FINDINGS: CT HEAD FINDINGS Brain: No acute territorial infarction, hemorrhage, or intracranial mass is seen. New focal hypodensity in the right cerebellum, probable old infarct but new since April 2018 CT. No  surrounding mass effect. Moderate atrophy. Small vessel ischemic  changes of the white matter. Stable ventricle size. Vascular: No hyperdense vessels.  Carotid artery calcifications. Skull: No depressed skull fracture is seen. Other: Large amount of left periorbital hematoma. CT MAXILLOFACIAL FINDINGS Osseous: Zygomatic arches are intact. Degenerative changes at the TMJ with flattening of the mandibular heads. No mandibular fracture is seen. Pterygoid plates are intact. Markedly comminuted, and displaced bilateral nasal bone fracture with involvement of the anterior nasal septum. Small comminuted fracture anterior nasal process of the maxillary bone. Orbits: Negative. No traumatic or inflammatory finding. Sinuses: No acute fluid levels. No sinus wall fracture. Mucosal thickening in the ethmoid sinuses. Soft tissues: Large left facial and periorbital hematoma with hematoma over the nasal area. Soft tissue swelling within the anterior nasal passage. IMPRESSION: 1. No definite CT evidence for acute intracranial abnormality. There is a focal hypodensity in the right cerebellum, suspected to represent an old infarct, but new since CT from April 2018. 2. Large amount of left facial and periorbital soft tissue hematoma. Severely comminuted, and displaced bilateral nasal bone fracture with comminuted fracture involving the anterior bony nasal septum. Small comminuted fracture involving the anterior nasal process of the maxillary bone. Negative for orbital fracture. Electronically Signed   By: Donavan Foil M.D.   On: 09/05/2016 17:31   Ct Pelvis Wo Contrast  Result Date: 09/05/2016 CLINICAL DATA:  83 y/o F; status post fall with possible superior pubic ramus fracture on the right. EXAM: CT PELVIS WITHOUT CONTRAST TECHNIQUE: Multidetector CT imaging of the pelvis was performed following the standard protocol without intravenous contrast. COMPARISON:  None. FINDINGS: Urinary Tract:  No abnormality visualized. Bowel:  Extensive sigmoid diverticulosis. No evidence for acute diverticulitis. Vascular/Lymphatic: Calcific atherosclerosis of the abdominal aorta and bilateral iliofemoral arteries. Reproductive:  No mass or other significant abnormality Other:  None. Musculoskeletal: No acute fracture identified. Mild osteoarthrosis of the hip joints bilaterally with productive changes of the acetabular rims and femoral head neck junctions, greater on the right. Degenerative changes of the lower lumbar spine with prominent facet arthropathy. IMPRESSION: 1. No acute fracture or dislocation identified. 2. Chronic changes as above. Electronically Signed   By: Kristine Garbe M.D.   On: 09/05/2016 23:11   Dg Chest Port 1 View  Result Date: 09/06/2016 CLINICAL DATA:  Hypoxia EXAM: PORTABLE CHEST 1 VIEW COMPARISON:  06/03/2016 FINDINGS: Heart size and pulmonary vascularity are normal. A loop recorder is present. Shallow inspiration. Interstitial pattern to the lungs likely representing chronic fibrosis. Right apical calcification is likely postinflammatory. No consolidation in the lungs. No blunting of costophrenic angles. No pneumothorax. Calcified and tortuous aorta. IMPRESSION: Chronic pulmonary fibrosis. No evidence of active pulmonary disease. Electronically Signed   By: Lucienne Capers M.D.   On: 09/06/2016 01:19   Ct Maxillofacial Wo Contrast  Result Date: 09/05/2016 CLINICAL DATA:  Fall EXAM: CT HEAD WITHOUT CONTRAST CT MAXILLOFACIAL WITHOUT CONTRAST TECHNIQUE: Multidetector CT imaging of the head and maxillofacial structures were performed using the standard protocol without intravenous contrast. Multiplanar CT image reconstructions of the maxillofacial structures were also generated. COMPARISON:  06/03/2016, MRI 12/01/2013, 08/02/2016 FINDINGS: CT HEAD FINDINGS Brain: No acute territorial infarction, hemorrhage, or intracranial mass is seen. New focal hypodensity in the right cerebellum, probable old infarct but new  since April 2018 CT. No surrounding mass effect. Moderate atrophy. Small vessel ischemic changes of the white matter. Stable ventricle size. Vascular: No hyperdense vessels.  Carotid artery calcifications. Skull: No depressed skull fracture is seen. Other: Large amount of left periorbital hematoma. CT MAXILLOFACIAL FINDINGS  Osseous: Zygomatic arches are intact. Degenerative changes at the TMJ with flattening of the mandibular heads. No mandibular fracture is seen. Pterygoid plates are intact. Markedly comminuted, and displaced bilateral nasal bone fracture with involvement of the anterior nasal septum. Small comminuted fracture anterior nasal process of the maxillary bone. Orbits: Negative. No traumatic or inflammatory finding. Sinuses: No acute fluid levels. No sinus wall fracture. Mucosal thickening in the ethmoid sinuses. Soft tissues: Large left facial and periorbital hematoma with hematoma over the nasal area. Soft tissue swelling within the anterior nasal passage. IMPRESSION: 1. No definite CT evidence for acute intracranial abnormality. There is a focal hypodensity in the right cerebellum, suspected to represent an old infarct, but new since CT from April 2018. 2. Large amount of left facial and periorbital soft tissue hematoma. Severely comminuted, and displaced bilateral nasal bone fracture with comminuted fracture involving the anterior bony nasal septum. Small comminuted fracture involving the anterior nasal process of the maxillary bone. Negative for orbital fracture. Electronically Signed   By: Donavan Foil M.D.   On: 09/05/2016 17:31     EKG: Independently reviewed.  Sinus rhythm, QTC 496, left bundle blockage.   Assessment/Plan Principal Problem:   Fall Active Problems:   Hyperlipidemia   Essential hypertension   CAD (coronary artery disease)   PAF (paroxysmal atrial fibrillation) ([facility])   Transient cerebral ischemia   GERD   ILD (interstitial lung disease) ([place])   Acute on chronic  respiratory failure with hypoxia ([facility])   Open fracture of nasal bones   Chronic diastolic (congestive) heart failure ([place])   Fall, open fracture of nasal bones, soft tissues bruises: Patient had a mechanical fall. Nose laceration was sutured up in ED physician. No active bleeding now.  -will place on tele bed for obs -hold ASA now -Wound care consult -prn percocet for pain -pt/Ot  HLD: -Lipitor  Hx of stroke and TIA -hold ASA -Continue lipitor  HTN: -IV hydralazine when necessary -Continue home metoprolol  CAD: no CP -continue lipitor and hold ASA  Atrial Fibrillation: CHA2DS2-VASc Score is 8, needs oral anticoagulation, but pt is not on AC, not sure why she is not on AC, possibly due to high risk of fall. Heart rate is well controlled. -continue metoprolol  GERD: -Protonix  Chronic diastolic (congestive) heart failure: 2-D echo on 07/05/78 showed EF 60-65% with grade 2 diastolic dysfunction. Patient does not have leg edema JVD. She has shortness breath, which is likely due to interstitial lung disease. Patient is not taking diuretics at home. -Check BNP -Continue metoprolol and hold aspirin as above  Hyperkalemia: Potassium of 5.3, but the sample has hemolysis. No EKG changes. -Repeat BMP in AM  Acute on chronic respiratory failure with hypoxia and ILD: Patient had oxygen desaturation to 84-86 percent on room air after he received one dose of morphine. Patient denies shortness of breath. She has mild cough, but no fever. She has mild leukocytosis, which is likely due to stress. Chest x-ray negative for acute abnormalities.  -DuoNeb nebulizer, when necessary albuterol nebulizer -When necessary Mucinex for cough, -Nasal cannula oxygen to maintain oxygen about 90%   DVT ppx: SCD Code Status: DNR (I discussed with patient in the presence of her son and daughter-in law, and explained the meaning of CODE STATUS. Patient wants to be DNR) Family Communication: Yes, patient's  son and daughter-in law at bed side Disposition Plan:  Anticipate discharge back to previous home environment Consults called:  none Admission status: Obs / tele  Date of Service 09/06/2016    Ivor Costa [facility] Pager [phone]  If 7PM-7AM, please contact night-coverage [email or web address] Password [email or web address] 09/06/2016, 4:10 AM

## 2016-09-07 LAB — GLUCOSE, CAPILLARY: GLUCOSE-CAPILLARY: 152 mg/dL — AB (ref 65–99)

## 2016-09-07 NOTE — Progress Notes (Signed)
[name] to be D/C'd Skilled nursing facility per MD order.  Discussed with the patient and all questions fully answered.  VSS, Skin clean, dry and intact without evidence of skin break down, no evidence of skin tears noted. IV catheter discontinued intact. Site without signs and symptoms of complications. Dressing and pressure applied.  An After Visit Summary was printed and given to the patient. Patient received prescription.  D/c education completed with patient/family including follow up instructions, medication list, d/c activities limitations if indicated, with other d/c instructions as indicated by MD - patient able to verbalize understanding, all questions fully answered.   Patient instructed to return to ED, call 911, or call MD for any changes in condition.   Patient transported via [place] 09/07/2016 2:16 PM

## 2016-09-07 NOTE — Progress Notes (Signed)
Physical Therapy Treatment Patient Details Name: [name] MRN: [id number] DOB: 08-27-1920 Today's Date: 09/07/2016    History of Present Illness [name] is a 83 y.o. female with medical history significant of hypertension, hyperlipidemia, diet-controlled diabetes, interstitial lung disease, stroke, TIA, GERD, atrial fibrillation not on anticoagulants, left bundle located, CAD, dCHF, carotid artery stenosis, who presents with fall,and right hip pain.    PT Comments    Patient making progress with mobility and gait.  Patient will benefit from SNF at d/c for continued therapy.     Follow Up Recommendations  SNF;Supervision for mobility/OOB (Unable to d/c to CIR - see their note)     Equipment Recommendations  Rolling walker with 5" wheels;3in1 (PT)    Recommendations for Other Services       Precautions / Restrictions Precautions Precautions: Fall Precaution Comments: falls pta Restrictions Weight Bearing Restrictions: No    Mobility  Bed Mobility Overal bed mobility: Needs Assistance Bed Mobility: Rolling;Sidelying to Sit;Sit to Supine Rolling: Modified independent (Device/Increase time) Sidelying to sit: Min assist   Sit to supine: Min assist   General bed mobility comments: Assist to raise trunk to sitting.  Good sitting balance once upright.  Assist to bring LE's onto bed to return to supine.  Transfers Overall transfer level: Needs assistance Equipment used: Rolling walker (2 wheeled) Transfers: Sit to/from [facility] Sit to Stand: Min assist Stand pivot transfers: Min assist       General transfer comment: Min assist to shift trunk forward and power up to stance.  Patient able to take several shuffle steps and transfer bed <> BSC with min assist.  Patient incontinent x3 with stance.  Ambulation/Gait Ambulation/Gait assistance: Min assist Ambulation Distance (Feet): 40 Feet Assistive device: Rolling walker (2 wheeled) Gait  Pattern/deviations: Step-through pattern;Decreased step length - right;Decreased step length - left;Trunk flexed Gait velocity: decreased Gait velocity interpretation: Below normal speed for age/gender General Gait Details: Patient able to ambulate 36' with no break.  Fairly steady during gait.   Stairs            Wheelchair Mobility    Modified Rankin (Stroke Patients Only)       Balance   Sitting-balance support: No upper extremity supported;Feet supported Sitting balance-Leahy Scale: Good     Standing balance support: Bilateral upper extremity supported;During functional activity Standing balance-Leahy Scale: Poor Standing balance comment: reliant on UE support to stabilize in standing                            Cognition Arousal/Alertness: Awake/alert Behavior During Therapy: WFL for tasks assessed/performed Overall Cognitive Status: Within Functional Limits for tasks assessed                                        Exercises      General Comments        Pertinent Vitals/Pain Pain Assessment: Faces Faces Pain Scale: Hurts little more Pain Location: Lt eye feels like something is in it. (RN notified).  Sore all over. Pain Descriptors / Indicators: Sore Pain Intervention(s): Monitored during session;Repositioned    Home Living                      Prior Function            PT Goals (current goals can  now be found in the care plan section) Acute Rehab PT Goals Patient Stated Goal: get better Progress towards PT goals: Progressing toward goals    Frequency    Min 3X/week      PT Plan Discharge plan needs to be updated    Co-evaluation              AM-PAC PT "6 Clicks" Daily Activity  Outcome Measure  Difficulty turning over in bed (including adjusting bedclothes, sheets and blankets)?: A Little Difficulty moving from lying on back to sitting on the side of the bed? : Total Difficulty sitting down  on and standing up from a chair with arms (e.g., wheelchair, bedside commode, etc,.)?: Total Help needed moving to and from a bed to chair (including a wheelchair)?: A Little Help needed walking in hospital room?: A Little Help needed climbing 3-5 steps with a railing? : A Lot 6 Click Score: 13    End of Session Equipment Utilized During Treatment: Gait belt;Oxygen Activity Tolerance: Patient limited by fatigue Patient left: in bed;with call bell/phone within reach;with nursing/sitter in room Nurse Communication: Mobility status (Incontinent and cleaned. Needs pure wick replaced.) PT Visit Diagnosis: Unsteadiness on feet (R26.81);Muscle weakness (generalized) (M62.81);Difficulty in walking, not elsewhere classified (R26.2)     Time: 7048-8891 PT Time Calculation (min) (ACUTE ONLY): 34 min  Charges:  $Gait Training: 8-22 mins $Therapeutic Activity: 8-22 mins                    G Codes:  Functional Assessment Tool Used: AM-PAC 6 Clicks Basic Mobility;Clinical judgement Functional Limitation: Mobility: Walking and moving around Mobility: Walking and Moving Around Goal Status [phone]): At least 1 percent but less than 20 percent impaired, limited or restricted Mobility: Walking and Moving Around Discharge Status [phone]): At least 40 percent but less than 60 percent impaired, limited or restricted    Carita Pian. Sanjuana Kava, [facility] Acute Rehab Services Pager [phone]    Despina Pole 09/07/2016, 12:53 PM

## 2016-09-07 NOTE — Progress Notes (Signed)
Moderate amount of serosanguinous drainage from L eye. Gauze rolls place under eye and to left of eye to keep drainage from soiling patient and clothing. Will continue to monitor.

## 2016-09-07 NOTE — Clinical Social Work Note (Signed)
Clinical Social Worker facilitated patient discharge including contacting patient family and facility to confirm patient discharge plans. Clinical information faxed to facility and family agreeable with plan. CSW arranged ambulance transport via [facility] to [facility]. RN to call report prior to discharge.  Clinical Social Worker will sign off for now as social work intervention is no longer needed. Please consult [place] again if new need arises.  Arch Methot B. Joline Maxcy Clinical Social Work Dept Weekend Social Worker [phone] 1:13 PM

## 2016-09-07 NOTE — Progress Notes (Signed)
Report called to [facility]

## 2016-09-07 NOTE — Clinical Social Work Placement (Signed)
   CLINICAL SOCIAL WORK PLACEMENT  NOTE  Date:  09/07/2016  Patient Details  Name: [name] MRN: [id number] Date of Birth: 1921-01-29  Clinical Social Work is seeking post-discharge placement for this patient at the [place] level of care (*CSW will initial, date and re-position this form in  chart as items are completed):  Yes   Patient/family provided with [place] [facility] Work Department's list of facilities offering this level of care within the geographic area requested by the patient (or if unable, by the patient's family).  Yes   Patient/family informed of their freedom to choose among providers that offer the needed level of care, that participate in Medicare, Medicaid or managed care program needed by the patient, have an available bed and are willing to accept the patient.  Yes   Patient/family informed of [facility] ownership interest in [facility] and [facility], as well as of the fact that they are under no obligation to receive care at these facilities.  PASRR submitted to EDS on 09/06/16     PASRR number received on 09/06/16     Existing PASRR number confirmed on       FL2 transmitted to all facilities in geographic area requested by pt/family on 09/06/16     FL2 transmitted to all facilities within larger geographic area on       Patient informed that his/her managed care company has contracts with or will negotiate with certain facilities, including the following:        Yes   Patient/family informed of bed offers received.  Patient chooses bed at  [facility])     Physician recommends and patient chooses bed at      Patient to be transferred to  ([facility]) on 09/07/16.  Patient to be transferred to facility by  [facility])     Patient family notified on 09/07/16 of transfer.  Name of family member notified:  Family @ bedside      PHYSICIAN Please sign DNR     Additional Comment:     _______________________________________________ Serafina Mitchell, LCSWA 09/07/2016, 1:16 PM

## 2016-09-09 ENCOUNTER — Telehealth: Payer: Self-pay | Admitting: *Deleted

## 2016-09-09 MED ORDER — MIRTAZAPINE 15 MG PO TABS: 15.0000 mg | ORAL_TABLET | Freq: Every day | ORAL | 2 refills | Status: DC

## 2016-09-09 NOTE — Telephone Encounter (Signed)
Rx sent 

## 2016-09-09 NOTE — Telephone Encounter (Signed)
[employer], [facility] [place] [facility] Refill request Mirtazapine 15 mg,  Take one tablet by mouth once daily at bedtime. Medication not found on medication list Okay to refill?

## 2016-09-09 NOTE — Telephone Encounter (Signed)
This should've been on her list unless the hospitalist took it off. Refill for 3 months

## 2016-09-10 ENCOUNTER — Telehealth: Payer: Self-pay

## 2016-09-10 NOTE — Telephone Encounter (Signed)
LMTCB

## 2016-09-11 NOTE — Telephone Encounter (Signed)
LMTCB

## 2016-09-13 NOTE — Telephone Encounter (Signed)
LMTCB

## 2016-09-23 ENCOUNTER — Ambulatory Visit: Payer: Medicare Other | Admitting: Physician Assistant

## 2016-09-23 ENCOUNTER — Encounter: Payer: Self-pay | Admitting: Family Medicine

## 2016-09-23 ENCOUNTER — Ambulatory Visit (INDEPENDENT_AMBULATORY_CARE_PROVIDER_SITE_OTHER): Payer: Medicare Other | Admitting: Family Medicine

## 2016-09-23 VITALS — BP 108/58 | HR 84 | Temp 97.9°F | Wt 127.2 lb

## 2016-09-23 DIAGNOSIS — S0083XD Contusion of other part of head, subsequent encounter: Secondary | ICD-10-CM | POA: Diagnosis not present

## 2016-09-23 DIAGNOSIS — S7002XD Contusion of left hip, subsequent encounter: Secondary | ICD-10-CM | POA: Diagnosis not present

## 2016-09-23 DIAGNOSIS — S022XXD Fracture of nasal bones, subsequent encounter for fracture with routine healing: Secondary | ICD-10-CM | POA: Diagnosis not present

## 2016-09-23 NOTE — Progress Notes (Signed)
Subjective:     Patient ID: [name], female   DOB: 1920/06/27, 83 y.o.   MRN: [id number]  HPI Patient seen for hospital follow-up. She had a fall on July 26. She's out in the yard helping her son and went to grab a branch and lost her balance and fell apparently hitting her face against a fence. There was no reported loss of consciousness. She states that she stepped on a wet [place] leaf and that is why she lost her balance. She sustained nasal fractures and nasal bridge laceration and left periorbital hematoma and left hip contusion. She apparently got some morphine in the ER and became hypoxic afterwards.  Patient was discharged the next day on antibiotics for 1 week. She has been followed by facial surgeon and has one more follow-up there. She was hypoxic during hospitalization and apparently required up to 4 L of oxygen to keep O 2 over 90 %. . She has chronic interstitial lung disease. She was sent home with oxygen but states she's not been using that any. She states that her home oxygen as have been consistently around 94% even with walking at home without oxygen. She refuses oxygen at this time.  We had taken her off of Ambien recently to reduce risk of falls at night. She is currently not taking any thing other than gabapentin 100 mg occasionally at night but no other sedative hypnotics.  She had multiple x-rays including CT head and pelvis. She had evidence on CT of head for small comminuted fracture anterior nasal process of maxillary bone. No acute intracranial abnormalities. Severely comminuted and displaced bilateral nasal bone fracture. CT pelvis no acute fracture. She is ambulating without difficulty now. She still has some nasal stuffiness.  Past Medical History:  Diagnosis Date  . ANEMIA, NORMOCYTIC 11/30/2009  . CAD (coronary artery disease) 04/27/2008   Non-Obstructive >> Cardiac catheterization 6/08: EF 55%, 1+ MR, Dx 50%, LAD 50%, dLAD 40%, OM1 50-60% (up to 70%), pOM  30%, pRCA 40%, mRCA 50-60%, pPDA 50-60% (up to 70%).  Medical therapy was recommended // MV 11/14: Normal stress nuclear study. LV Ejection Fraction: 67%.   . Carotid artery disease ([place])    Left CEA in 10/12.  Carotid dopplers (10/15) with 1-39% bilateral ICA stenosis. // Carotid US 10/17: R < 40; patent L CEA site  . COLONIC POLYPS, ADENOMATOUS 12/26/2009  . DIABETES MELLITUS, TYPE II 08/11/2006  . DIVERTICULOSIS, COLON 08/11/2006  . GERD 02/16/2007  . Hemorrhoids    s/p banding  . History of echocardiogram    Echo 5/18: EF 60-65, no RWMA, Gr 2 DD, mild-mod MR, mod LAE // Echo 2/17: Mild LVH, mild focal basal septal hypertrophy, EF 55-60%, no RWMA, Gr 1 DD, MAC, mild MR, mild  // Echo 4/14: EF 60-65%, normal wall motion, Gr 1 DD, MAC, mild MR, PASP 32, reduced excursion of AV noncoronary cusp.    Marland Kitchen HYPERLIPIDEMIA 09/09/2006  . HYPERTENSION 09/09/2006  . LEFT BUNDLE BRANCH BLOCK 08/11/2006  . OSTEOARTHRITIS 08/11/2006  . OSTEOPOROSIS 08/11/2006  . PAF (paroxysmal atrial fibrillation) ([place]) 04/12/2009   Paroxysmal, only prior documented episode was years ago.  She was not started on anticoagulation back then due to hemorrhoidal bleeding, apparently profuse.  Event monitor (3/14) showed only NSR.    Marland Kitchen Stroke ([place]) 12-06-13  . Syncope    8/15. EEG (5/15) was unremarkable. // 4/18: ILR neg for arrhythmia - ? orthostatic (no prodrome reported) >> c/b SAH (small)   . TIA  09/02/2006   Echocardiogram 6/12: Mild to moderate MR, trace AI, trace TR, PASP 34, bubble study negative for intracardiac shunting, normal EF (greater than 55%) //  Possible TIA in 10/15 with visual field cut.  MRI (10/15) with no CVA. Linq monitor placed.   Past Surgical History:  Procedure Laterality Date  . CAROTID ENDARTERECTOMY Left 12-05-2010  . CLOSED REDUCTION METATARSAL FRACTURE     right 5th  . COLONOSCOPY W/ POLYPECTOMY  2001  . EYE SURGERY     catarac  . LOOP RECORDER IMPLANT N/A 01/03/2014   Procedure: LOOP RECORDER  IMPLANT;  Surgeon: Evans Lance, MD;  Location: [facility] CATH LAB;  Service: Cardiovascular;  Laterality: N/A;  . TONSILLECTOMY      reports that she has never smoked. She has never used smokeless tobacco. She reports that she does not drink alcohol or use drugs. family history includes Breast cancer in her sister; Colon cancer in her brother; Congestive Heart Failure (age of onset: 35) in her father; Heart attack in her mother; Heart disease in her brother; Heart disease (age of onset: 86) in her mother; Kidney disease in her mother; Uterine cancer in her sister. Allergies  Allergen Reactions  . Nitrofurantoin     unknown  . Sulfamethoxazole-Trimethoprim     unknown  . Sulfonamide Derivatives     REACTION: rash     Review of Systems  Constitutional: Negative for chills and fever.  Respiratory: Negative for cough.   Cardiovascular: Negative for chest pain.  Gastrointestinal: Negative for abdominal pain.  Genitourinary: Negative for dysuria.  Neurological: Negative for headaches.  Psychiatric/Behavioral: Negative for confusion.       Objective:   Physical Exam  Constitutional: She appears well-developed and well-nourished.  HENT:  She has scar bridge of nose from recent facial laceration Facial hematoma left side of face inferior orbital region. No erythema or warmth.  Neck: Neck supple.  Cardiovascular: Normal rate and regular rhythm.   Murmur heard. Pulmonary/Chest: Effort normal.  She has somewhat diminished breath sounds throughout  Musculoskeletal: She exhibits no edema.       Assessment:     #1 recent fall with comminuted nasal fracture and left facial hematoma and nasal laceration  #2 history of hypoxemia with history of chronic interstitial lung disease. Patient refusing home oxygen. Pulse oximetry today at rest 97%. After ambulating in hallways this did drop to 86% but promptly came back up to 94% at rest room air  #3 left hip pain following fall. Her pain is more  iliac crest region and she has no significant pains with weightbearing and no visible ecchymosis in this region. No palpable swelling.  CT no fracture.    Plan:     -Continue to avoid Ambien or any benzodiazepines to reduce risk of falls -Home physical therapy already being set up. She's had extensive home therapy in the past -pt declines home O2 as above.  Eulas Post MD [facility]

## 2016-09-24 ENCOUNTER — Ambulatory Visit (INDEPENDENT_AMBULATORY_CARE_PROVIDER_SITE_OTHER): Payer: Medicare Other | Admitting: Physician Assistant

## 2016-09-24 ENCOUNTER — Encounter: Payer: Self-pay | Admitting: Physician Assistant

## 2016-09-24 VITALS — BP 140/54 | HR 78 | Ht 66.0 in | Wt 125.8 lb

## 2016-09-24 DIAGNOSIS — J849 Interstitial pulmonary disease, unspecified: Secondary | ICD-10-CM

## 2016-09-24 DIAGNOSIS — W19XXXD Unspecified fall, subsequent encounter: Secondary | ICD-10-CM | POA: Diagnosis not present

## 2016-09-24 DIAGNOSIS — I480 Paroxysmal atrial fibrillation: Secondary | ICD-10-CM

## 2016-09-24 DIAGNOSIS — I2510 Atherosclerotic heart disease of native coronary artery without angina pectoris: Secondary | ICD-10-CM

## 2016-09-24 DIAGNOSIS — I10 Essential (primary) hypertension: Secondary | ICD-10-CM | POA: Diagnosis not present

## 2016-09-24 DIAGNOSIS — I779 Disorder of arteries and arterioles, unspecified: Secondary | ICD-10-CM | POA: Diagnosis not present

## 2016-09-24 DIAGNOSIS — R55 Syncope and collapse: Secondary | ICD-10-CM

## 2016-09-24 DIAGNOSIS — I739 Peripheral vascular disease, unspecified: Secondary | ICD-10-CM

## 2016-09-24 NOTE — Progress Notes (Signed)
Cardiology Office Note:    Date:  09/24/2016   ID:  [name], DOB March 12, 1920, MRN [id number]  PCP:  Eulas Post, MD  Cardiologist:Dr.Thomas Stuckey >>Dr. Ena Dawley >>Dr. Kirk Ruths McLean>> Dr. Liam Rogers  Electrophysiologist:Dr. Cristopher Peru  Pulmonologist: Dr. Caralee Ates  Referring MD: Eulas Post, MD   Chief Complaint  Patient presents with  . Coronary Artery Disease    Follow up    History of Present Illness:    [name] is a 83 y.o. female with a hx of  non-obstructive CAD, prior TIA, carotid stenosis, s/p left CEA 11/2010, syncope, paroxysmal AFlutter, HTN, DM2, HL, LBBB. She has been felt to be a poor candidate for Coumadin in the past. She had atrial fibrillation documented years ago but has not had a documented recurrence.She has not been on anticoagulation due to a concern for profuse hemorrhoidal bleeding.A Cardiolite in 11/14 showed no ischemia or infarction.In 8/15, she had a syncopal event while standing in the kitchen.She was not unconscious for long.She then had 2 episodes concerning for TIAs.Both times, she lost a portion of her left visual field transiently.MRI after the 2nd episode showed no evidence for CVA.She was thought to have a TIA.She did see ophthalmology without significant abnormality identified.  Linq ILR was implanted to look for atrial fibrillation.She was admitted in 2/17 with worsening dyspnea and PFTs demonstrated suggestion of mild restriction.  Pulmonology recommended onservative therapy for her interstitial lung disease.  She was admitted in 05/2016 with unexplained syncope resulting in subarachnoid hemorrhage.  ILR was interrogated and there was no arrhythmia noted.  She was ultimately cleared by neurosurgery to resume ASA.  I reviewed her case with Dr. Lovena Le and we recommended no driving for 6 mos after her syncopal episode.  She was last seen in 5/18.  A follow up echo demonstrated  normal LVF and mod diastolic dysfunction.    She was admitted 7/26-7/27 after a mechanical fall resulting in multiple nasal fractures, lacerations and a L periorbital hematoma.  She was observed overnight for hypoxia and was DC'd on O2.    [name] returns for follow up.  She is here alone. She denies chest pain or significant shortness of breath.  She denies paroxysmal nocturnal dyspnea, edema or syncope. She remember slipping on a leaf when she fell in late July.  She did not lose consciousness.  She stopped her O2 and her RA O2 at her PCP's office yesterday was 94%.    Prior CV studies:   The following studies were reviewed today:  Echo 07/04/16 EF 60-65, normal wall motion, grade 2 diastolic dysfunction, calcified aortic valve, mild to moderate MR, moderate LAE  Carotid US 10/17 R < 40; patent L CEA site   Echo 03/16/15 Mild LVH, mild focal basal septal hypertrophy, EF 55-60%, no RWMA, Gr 1 DD, MAC, mild MR, mild LAE   Carotid US 10/16 RICA < 40% L CEA patent   Adenosine Myoview (12/23/12): Normal stress nuclear study. LV Ejection Fraction: 67%.    Echo 05/2012:   EF 60-65%, normal wall motion, Gr 1 DD, MAC, mild MR, PASP 32, reduced excursion of AV noncoronary cusp.     Carotid US 5/14:  patent L CEA, RICA < 40%.  Event Monitor 3/14:  NSR.   LHC 6/08:  EF 55%, 1+ MR,  Dx 50%, LAD 50%, dLAD 40% OM1 50-60% (up to 70%), pOM 30% pRCA 40%, mRCA 50-60%, pPDA 50-60% (up to 70%).  Medical  Rx was recommended.      Past Medical History:  Diagnosis Date  . ANEMIA, NORMOCYTIC 11/30/2009  . CAD (coronary artery disease) 04/27/2008   Non-Obstructive >> Cardiac catheterization 6/08: EF 55%, 1+ MR, Dx 50%, LAD 50%, dLAD 40%, OM1 50-60% (up to 70%), pOM 30%, pRCA 40%, mRCA 50-60%, pPDA 50-60% (up to 70%).  Medical therapy was recommended // MV 11/14: Normal stress nuclear study. LV Ejection Fraction: 67%.   . Carotid artery disease ([place])    Left CEA in 10/12.  Carotid dopplers  (10/15) with 1-39% bilateral ICA stenosis. // Carotid US 10/17: R < 40; patent L CEA site  . COLONIC POLYPS, ADENOMATOUS 12/26/2009  . DIABETES MELLITUS, TYPE II 08/11/2006  . DIVERTICULOSIS, COLON 08/11/2006  . GERD 02/16/2007  . Hemorrhoids    s/p banding  . History of echocardiogram    Echo 5/18: EF 60-65, no RWMA, Gr 2 DD, mild-mod MR, mod LAE // Echo 2/17: Mild LVH, mild focal basal septal hypertrophy, EF 55-60%, no RWMA, Gr 1 DD, MAC, mild MR, mild  // Echo 4/14: EF 60-65%, normal wall motion, Gr 1 DD, MAC, mild MR, PASP 32, reduced excursion of AV noncoronary cusp.    Marland Kitchen HYPERLIPIDEMIA 09/09/2006  . HYPERTENSION 09/09/2006  . LEFT BUNDLE BRANCH BLOCK 08/11/2006  . OSTEOARTHRITIS 08/11/2006  . OSTEOPOROSIS 08/11/2006  . PAF (paroxysmal atrial fibrillation) ([place]) 04/12/2009   Paroxysmal, only prior documented episode was years ago.  She was not started on anticoagulation back then due to hemorrhoidal bleeding, apparently profuse.  Event monitor (3/14) showed only NSR.    Marland Kitchen Stroke ([place]) 12-06-13  . Syncope    8/15. EEG (5/15) was unremarkable. // 4/18: ILR neg for arrhythmia - ? orthostatic (no prodrome reported) >> c/b SAH (small)   . TIA 09/02/2006   Echocardiogram 6/12: Mild to moderate MR, trace AI, trace TR, PASP 34, bubble study negative for intracardiac shunting, normal EF (greater than 55%) //  Possible TIA in 10/15 with visual field cut.  MRI (10/15) with no CVA. Linq monitor placed.    Past Surgical History:  Procedure Laterality Date  . CAROTID ENDARTERECTOMY Left 12-05-2010  . CLOSED REDUCTION METATARSAL FRACTURE     right 5th  . COLONOSCOPY W/ POLYPECTOMY  2001  . EYE SURGERY     catarac  . LOOP RECORDER IMPLANT N/A 01/03/2014   Procedure: LOOP RECORDER IMPLANT;  Surgeon: Evans Lance, MD;  Location: [facility] CATH LAB;  Service: Cardiovascular;  Laterality: N/A;  . TONSILLECTOMY      Current Medications: Current Meds  Medication Sig  . acetaminophen (TYLENOL) 325 MG tablet  Take 2 tablets (650 mg total) by mouth every 4 (four) hours as needed for mild pain (or temp > 37.5 C (99.5 F)).  . Ascorbic Acid (VITAMIN C PO) Take 1 tablet by mouth daily.   Marland Kitchen aspirin EC 81 MG tablet Take 1 tablet (81 mg total) by mouth daily.  Marland Kitchen atorvastatin (LIPITOR) 20 MG tablet Take 1 tablet (20 mg total) by mouth daily at 6 PM.  . calcium carbonate 200 MG capsule Take 200 mg by mouth daily.   . cephALEXin (KEFLEX) 500 MG capsule Take 1 capsule (500 mg total) by mouth 4 (four) times daily.  . Cholecalciferol (VITAMIN D PO) Take 1 tablet by mouth daily.   . Cyanocobalamin (VITAMIN B 12 PO) Take 1 tablet by mouth daily.   Marland Kitchen docusate sodium (COLACE) 100 MG capsule Take 1 capsule (100 mg total) by mouth 2 (  two) times daily.  . fish oil-omega-3 fatty acids 1000 MG capsule Take 1 capsule by mouth daily.   Marland Kitchen gabapentin (NEURONTIN) 100 MG capsule Take 100 mg by mouth as needed for pain. Toe pain  . glucose blood test strip Use once daily  . ipratropium-albuterol (DUONEB) 0.5-2.5 (3) MG/3ML SOLN Take 4 times a day and every 4 hours as needed for diff breathing  . metoprolol tartrate (LOPRESSOR) 25 MG tablet TAKE ONE-HALF (1/2) TABLET TWICE A DAY  . Multiple Vitamins-Minerals (MULTIVITAMIN WITH MINERALS) tablet Take 1 tablet by mouth daily.  Marland Kitchen neomycin-bacitracin-polymyxin (NEOSPORIN) OINT Apply 1 application topically daily.  . ONE TOUCH LANCETS MISC Use daily as directed  . pantoprazole (PROTONIX) 20 MG tablet TAKE 1/2 TABLET BY MOUTH EVERY OTHER DAY  . PROAIR RESPICLICK 177 (90 Base) MCG/ACT AEPB Inhale 2 puffs into the lungs every 4 (four) hours as needed.  . traMADol (ULTRAM) 50 MG tablet Take 1 tablet (50 mg total) by mouth every 12 (twelve) hours as needed for severe pain.  Marland Kitchen VITAMIN E PO Take 1 tablet by mouth daily.      Allergies:   Nitrofurantoin; Sulfamethoxazole-trimethoprim; and Sulfonamide derivatives   Social History   Social History  . Marital status: Widowed    Spouse  name: N/A  . Number of children: 3  . Years of education: 12   Occupational History  .  Retired   Social History Main Topics  . Smoking status: Never Smoker  . Smokeless tobacco: Never Used  . Alcohol use No  . Drug use: No  . Sexual activity: Not Asked   Other Topics Concern  . None   Social History Narrative   Patient is a widow and lives alone.   Patient has three adult children.   Patient is retired.   Patient has a high school.   Patient is right-handed.   Patient does not drink any caffeine.     Family Hx: The patient's family history includes Breast cancer in her sister; Colon cancer in her brother; Congestive Heart Failure (age of onset: 67) in her father; Heart attack in her mother; Heart disease in her brother; Heart disease (age of onset: 21) in her mother; Kidney disease in her mother; Uterine cancer in her sister.  ROS:   Please see the history of present illness.    Review of Systems  Musculoskeletal: Positive for back pain.   All other systems reviewed and are negative.   EKGs/Labs/Other Test Reviewed:    EKG:  EKG is not ordered today.  The ekg ordered today demonstrates n/a  Recent Labs: 05/22/2016: TSH 3.46 06/03/2016: ALT 26; Magnesium 1.8 09/06/2016: B Natriuretic Peptide 364.9; BUN 24; Creatinine, Ser 1.08; Hemoglobin 10.1; Platelets 147; Potassium 4.5; Sodium 136   Recent Lipid Panel Lab Results  Component Value Date/Time   CHOL 126 05/22/2016 12:23 PM   TRIG 128.0 05/22/2016 12:23 PM   TRIG 80 01/13/2006 10:10 AM   HDL 47.40 05/22/2016 12:23 PM   CHOLHDL 3 05/22/2016 12:23 PM   LDLCALC 53 05/22/2016 12:23 PM    Physical Exam:    VS:  BP (!) 140/54   Pulse 78   Ht 5' 6"  (1.676 m)   Wt 125 lb 12.8 oz (57.1 kg)   BMI 20.30 kg/m     Wt Readings from Last 3 Encounters:  09/24/16 125 lb 12.8 oz (57.1 kg)  09/23/16 127 lb 3.2 oz (57.7 kg)  09/06/16 132 lb (59.9 kg)     Physical  Exam  Constitutional: She is oriented to person, place,  and time. She appears well-developed and well-nourished. No distress.  Eyes:  L periorbital ecchymosis noted  Neck: No JVD present.  Cardiovascular: Normal rate and regular rhythm.   Murmur heard.  Low-pitched early systolic murmur is present with a grade of 2/6  at the upper left sternal border Pulmonary/Chest: She has no wheezes.  Dry crackles at the bases noted bilat  Abdominal: There is no tenderness.  Musculoskeletal: She exhibits no edema.  Neurological: She is alert and oriented to person, place, and time.  Skin: Skin is warm and dry.  Psychiatric: She has a normal mood and affect.    ASSESSMENT:    1. Coronary artery disease involving native coronary artery of native heart without angina pectoris   2. Syncope, unspecified syncope type   3. Carotid artery disease, unspecified laterality ([place])   4. Essential hypertension   5. PAF (paroxysmal atrial fibrillation) ([place])   6. Fall, subsequent encounter   7. ILD (interstitial lung disease) ([place])    PLAN:    In order of problems listed above:  1. Coronary artery disease involving native coronary artery of native heart without angina pectoris Moderate nonobstructive CAD by cardiac catheterization in 2008.  Nuclear stress test in 2014 was low risk. She denies angina. Continue aspirin, statin.  2. Syncope, unspecified syncope type No recurrent syncope. We again discussed driving restrictions for 6 months after her last episode of syncope.  3. Carotid artery disease, unspecified laterality ([place]) s/p L CEA.  Managed by vascular surgery. Continue aspirin, statin.  4. Essential hypertension Fair control. Given her recent history of recurrent falls, I think she should have a goal of less than 150/90.  5. PAF (paroxysmal atrial fibrillation) ([place]) To date, no apparent recurrence. Given her recurrent falls, she would likely be a poor candidate for anticoagulation therapy.  2. Fall, subsequent encounter She had multiple facial  wounds. Continue follow-up with primary care.  7. ILD (interstitial lung disease) ([place])   She had low oxygen levels while in the hospital in July. She stopped using oxygen on her own. Her recent O2 sat yesterday with primary care was 94% on room air.    Dispo:  Return in about 6 months (around 03/27/2017) for Routine Follow Up, w/ Dr. Acie Fredrickson, or [name], PA-C.   Medication Adjustments/Labs and Tests Ordered: Current medicines are reviewed at length with the patient today.  Concerns regarding medicines are outlined above.  Tests Ordered: No orders of the defined types were placed in this encounter.  Medication Changes: No orders of the defined types were placed in this encounter.   Signed, [name], PA-C  09/24/2016 3:00 PM    [facility] [place], [place], [place]  [address] Phone: [phone]; Fax: [phone]

## 2016-09-24 NOTE — Patient Instructions (Signed)
Medication Instructions:  Your physician recommends that you continue on your current medications as directed. Please refer to the Current Medication list given to you today.   Labwork: NONE ORDERED   Testing/Procedures: NONE ORDERED  Follow-Up: Your physician wants you to follow-up in: [place] DR. Acie Fredrickson  You will receive a reminder letter in the mail two months in advance. If you don't receive a letter, please call our office to schedule the follow-up appointment.   Any Other Special Instructions Will Be Listed Below (If Applicable).     If you need a refill on your cardiac medications before your next appointment, please call your pharmacy.

## 2016-10-11 ENCOUNTER — Telehealth: Payer: Self-pay | Admitting: Family Medicine

## 2016-10-11 NOTE — Telephone Encounter (Signed)
Patient dropped off Handicap/DMV form. Call patient when done [phone] Put forms in DR's folder

## 2016-10-23 ENCOUNTER — Encounter: Payer: Self-pay | Admitting: Family Medicine

## 2016-10-23 ENCOUNTER — Ambulatory Visit (INDEPENDENT_AMBULATORY_CARE_PROVIDER_SITE_OTHER): Payer: Medicare Other | Admitting: Family Medicine

## 2016-10-23 VITALS — BP 120/58 | HR 72 | Temp 98.4°F | Wt 125.7 lb

## 2016-10-23 DIAGNOSIS — I10 Essential (primary) hypertension: Secondary | ICD-10-CM

## 2016-10-23 DIAGNOSIS — E118 Type 2 diabetes mellitus with unspecified complications: Secondary | ICD-10-CM | POA: Diagnosis not present

## 2016-10-23 DIAGNOSIS — S022XXD Fracture of nasal bones, subsequent encounter for fracture with routine healing: Secondary | ICD-10-CM | POA: Diagnosis not present

## 2016-10-23 DIAGNOSIS — R296 Repeated falls: Secondary | ICD-10-CM | POA: Diagnosis not present

## 2016-10-23 DIAGNOSIS — J849 Interstitial pulmonary disease, unspecified: Secondary | ICD-10-CM | POA: Diagnosis not present

## 2016-10-23 MED ORDER — TRAMADOL HCL 50 MG PO TABS: 50.0000 mg | ORAL_TABLET | Freq: Four times a day (QID) | ORAL | 0 refills | Status: DC | PRN

## 2016-10-23 NOTE — Progress Notes (Signed)
Subjective:     Patient ID: [name], female   DOB: 02-06-1921, 83 y.o.   MRN: [id number]  HPI Patient here accompanied by her son with recent fall with comminuted nasal fracture. Refer to prior note. She had fallen and hit her face against a fence. There was no loss of consciousness. She was admitted to the hospital. She had some hypoxemia and known chronic interstitial lung disease. She refused home oxygen and seems to be getting along well at this time without oxygen. No further falls.  She was supposed to get home physical therapy but has been having some pain around her left posterior lower rib cage area and that has limited her activity somewhat. She's taken Tylenol without very good relief. She states her pain is sometimes 8 out of 10. No pleuritic pain. No hip pain. CT pelvis revealed no acute fracture. Ambulating without difficulties  Nasal fracture sees to be healing well. She is able to breathe without difficulty.  Her chronic problems include history of diastolic heart failure, CAD, interstitial lung disease, controlled type 2 diabetes.  Past Medical History:  Diagnosis Date  . ANEMIA, NORMOCYTIC 11/30/2009  . CAD (coronary artery disease) 04/27/2008   Non-Obstructive >> Cardiac catheterization 6/08: EF 55%, 1+ MR, Dx 50%, LAD 50%, dLAD 40%, OM1 50-60% (up to 70%), pOM 30%, pRCA 40%, mRCA 50-60%, pPDA 50-60% (up to 70%).  Medical therapy was recommended // MV 11/14: Normal stress nuclear study. LV Ejection Fraction: 67%.   . Carotid artery disease ([place])    Left CEA in 10/12.  Carotid dopplers (10/15) with 1-39% bilateral ICA stenosis. // Carotid US 10/17: R < 40; patent L CEA site  . COLONIC POLYPS, ADENOMATOUS 12/26/2009  . DIABETES MELLITUS, TYPE II 08/11/2006  . DIVERTICULOSIS, COLON 08/11/2006  . GERD 02/16/2007  . Hemorrhoids    s/p banding  . History of echocardiogram    Echo 5/18: EF 60-65, no RWMA, Gr 2 DD, mild-mod MR, mod LAE // Echo 2/17: Mild LVH, mild focal basal  septal hypertrophy, EF 55-60%, no RWMA, Gr 1 DD, MAC, mild MR, mild  // Echo 4/14: EF 60-65%, normal wall motion, Gr 1 DD, MAC, mild MR, PASP 32, reduced excursion of AV noncoronary cusp.    Marland Kitchen HYPERLIPIDEMIA 09/09/2006  . HYPERTENSION 09/09/2006  . LEFT BUNDLE BRANCH BLOCK 08/11/2006  . OSTEOARTHRITIS 08/11/2006  . OSTEOPOROSIS 08/11/2006  . PAF (paroxysmal atrial fibrillation) ([place]) 04/12/2009   Paroxysmal, only prior documented episode was years ago.  She was not started on anticoagulation back then due to hemorrhoidal bleeding, apparently profuse.  Event monitor (3/14) showed only NSR.    Marland Kitchen Stroke ([place]) 12-06-13  . Syncope    8/15. EEG (5/15) was unremarkable. // 4/18: ILR neg for arrhythmia - ? orthostatic (no prodrome reported) >> c/b SAH (small)   . TIA 09/02/2006   Echocardiogram 6/12: Mild to moderate MR, trace AI, trace TR, PASP 34, bubble study negative for intracardiac shunting, normal EF (greater than 55%) //  Possible TIA in 10/15 with visual field cut.  MRI (10/15) with no CVA. Linq monitor placed.   Past Surgical History:  Procedure Laterality Date  . CAROTID ENDARTERECTOMY Left 12-05-2010  . CLOSED REDUCTION METATARSAL FRACTURE     right 5th  . COLONOSCOPY W/ POLYPECTOMY  2001  . EYE SURGERY     catarac  . LOOP RECORDER IMPLANT N/A 01/03/2014   Procedure: LOOP RECORDER IMPLANT;  Surgeon: Evans Lance, MD;  Location[facility] CATH LAB;  Service: Cardiovascular;  Laterality: N/A;  . TONSILLECTOMY      reports that she has never smoked. She has never used smokeless tobacco. She reports that she does not drink alcohol or use drugs. family history includes Breast cancer in her sister; Colon cancer in her brother; Congestive Heart Failure (age of onset: 81) in her father; Heart attack in her mother; Heart disease in her brother; Heart disease (age of onset: 62) in her mother; Kidney disease in her mother; Uterine cancer in her sister. Allergies  Allergen Reactions  . Nitrofurantoin      unknown  . Sulfamethoxazole-Trimethoprim     unknown  . Sulfonamide Derivatives     REACTION: rash     Review of Systems  Constitutional: Negative for appetite change, fatigue and unexpected weight change.  Eyes: Negative for visual disturbance.  Respiratory: Negative for cough, chest tightness, shortness of breath and wheezing.   Cardiovascular: Negative for chest pain, palpitations and leg swelling.  Endocrine: Negative for polydipsia and polyuria.  Genitourinary: Negative for dysuria.  Neurological: Negative for dizziness, seizures, syncope, weakness, light-headedness and headaches.       Objective:   Physical Exam  Constitutional: She appears well-developed and well-nourished.  Eyes: Pupils are equal, round, and reactive to light.  Neck: Neck supple. No JVD present. No thyromegaly present.  Cardiovascular: Normal rate and regular rhythm.  Exam reveals no gallop.   Pulmonary/Chest: Effort normal and breath sounds normal. No respiratory distress. She has no wheezes. She has no rales.  Musculoskeletal: She exhibits no edema.  She has some localized tenderness left posterior rib cage area. No visible bruising /ecchymosis  Neurological: She is alert.  Skin:  Scar bridge of nose from recent laceration. Well healed. Nose is nontender to palpation       Assessment:     #1 recent comminuted nasal fracture. She is not having any difficulties with breathing  #2 left posterior rib cage pain.  We discussed she could have rib fracture but x-rays would not change management. She's breathing without difficulty  #3 high-risk of falls  #4 history of type 2 diabetes which has been well controlled    Plan:     -We've again recommended no driving -Limited tramadol 50 mg one every 6 hours but take only for severe pain -Encourage frequent deep breathing to avoid atelectasis/pneumonia -Hopefully can start physical therapy in a couple weeks as her back pain improves. They'll let me know  and they're ready to pursue that and we can put in order for advanced home physical therapy  Eulas Post MD [facility]

## 2016-10-25 ENCOUNTER — Ambulatory Visit: Payer: Medicare Other | Admitting: Family Medicine

## 2016-10-31 ENCOUNTER — Encounter: Payer: Self-pay | Admitting: Family Medicine

## 2016-11-14 ENCOUNTER — Other Ambulatory Visit: Payer: Self-pay | Admitting: Family Medicine

## 2016-11-14 NOTE — Telephone Encounter (Signed)
Decline.  She is no longer on this.

## 2016-11-15 NOTE — Telephone Encounter (Signed)
Rx denial sent.

## 2016-11-18 ENCOUNTER — Ambulatory Visit: Payer: Medicare Other | Admitting: Family Medicine

## 2016-11-18 DIAGNOSIS — Z0289 Encounter for other administrative examinations: Secondary | ICD-10-CM

## 2016-11-20 ENCOUNTER — Ambulatory Visit (INDEPENDENT_AMBULATORY_CARE_PROVIDER_SITE_OTHER): Payer: Medicare Other | Admitting: Family Medicine

## 2016-11-20 ENCOUNTER — Encounter: Payer: Self-pay | Admitting: Family Medicine

## 2016-11-20 VITALS — BP 120/58 | HR 64 | Temp 97.6°F | Resp 16 | Ht 66.0 in | Wt 124.0 lb

## 2016-11-20 DIAGNOSIS — F5104 Psychophysiologic insomnia: Secondary | ICD-10-CM

## 2016-11-20 DIAGNOSIS — Z23 Encounter for immunization: Secondary | ICD-10-CM | POA: Diagnosis not present

## 2016-11-20 DIAGNOSIS — M545 Low back pain, unspecified: Secondary | ICD-10-CM

## 2016-11-20 NOTE — Patient Instructions (Signed)
Try topical medication such as Biofreeze or Icy Hot and may apply to back several times daily. Consider over the counter Melatonin 1 to 2 mg at night.

## 2016-11-20 NOTE — Progress Notes (Signed)
Subjective:     Patient ID: [name], female   DOB: 14-Oct-1920, 83 y.o.   MRN: [id number]  HPI Patient seen with bilateral lumbar back pain. She had fall back around the end of July. She had extensive x-rays which showed no fracture. She went briefly for physical therapy with [facility] and her back pain worsed. Location is lower lumbar area and bilateral. No spinal pain. She tried some heat with minimal relief. No radiculitis symptoms. She feels that she is getting somewhat more generalized weakness from less activity since the fall. Denies any dizziness. No syncope.  Chronic insomnia. Has been on Ambien in the past. We've strongly advocating avoiding this or benzodiazepines because of prior falls. She denies any balance problems in the past several weeks. Never tried melatonin. Has difficulty falling asleep and early morning awakening.  Past Medical History:  Diagnosis Date  . ANEMIA, NORMOCYTIC 11/30/2009  . CAD (coronary artery disease) 04/27/2008   Non-Obstructive >> Cardiac catheterization 6/08: EF 55%, 1+ MR, Dx 50%, LAD 50%, dLAD 40%, OM1 50-60% (up to 70%), pOM 30%, pRCA 40%, mRCA 50-60%, pPDA 50-60% (up to 70%).  Medical therapy was recommended // MV 11/14: Normal stress nuclear study. LV Ejection Fraction: 67%.   . Carotid artery disease ([place])    Left CEA in 10/12.  Carotid dopplers (10/15) with 1-39% bilateral ICA stenosis. // Carotid US 10/17: R < 40; patent L CEA site  . COLONIC POLYPS, ADENOMATOUS 12/26/2009  . DIABETES MELLITUS, TYPE II 08/11/2006  . DIVERTICULOSIS, COLON 08/11/2006  . GERD 02/16/2007  . Hemorrhoids    s/p banding  . History of echocardiogram    Echo 5/18: EF 60-65, no RWMA, Gr 2 DD, mild-mod MR, mod LAE // Echo 2/17: Mild LVH, mild focal basal septal hypertrophy, EF 55-60%, no RWMA, Gr 1 DD, MAC, mild MR, mild  // Echo 4/14: EF 60-65%, normal wall motion, Gr 1 DD, MAC, mild MR, PASP 32, reduced excursion of AV noncoronary cusp.    Marland Kitchen HYPERLIPIDEMIA 09/09/2006   . HYPERTENSION 09/09/2006  . LEFT BUNDLE BRANCH BLOCK 08/11/2006  . OSTEOARTHRITIS 08/11/2006  . OSTEOPOROSIS 08/11/2006  . PAF (paroxysmal atrial fibrillation) ([place]) 04/12/2009   Paroxysmal, only prior documented episode was years ago.  She was not started on anticoagulation back then due to hemorrhoidal bleeding, apparently profuse.  Event monitor (3/14) showed only NSR.    Marland Kitchen Stroke ([place]) 12-06-13  . Syncope    8/15. EEG (5/15) was unremarkable. // 4/18: ILR neg for arrhythmia - ? orthostatic (no prodrome reported) >> c/b SAH (small)   . TIA 09/02/2006   Echocardiogram 6/12: Mild to moderate MR, trace AI, trace TR, PASP 34, bubble study negative for intracardiac shunting, normal EF (greater than 55%) //  Possible TIA in 10/15 with visual field cut.  MRI (10/15) with no CVA. Linq monitor placed.   Past Surgical History:  Procedure Laterality Date  . CAROTID ENDARTERECTOMY Left 12-05-2010  . CLOSED REDUCTION METATARSAL FRACTURE     right 5th  . COLONOSCOPY W/ POLYPECTOMY  2001  . EYE SURGERY     catarac  . LOOP RECORDER IMPLANT N/A 01/03/2014   Procedure: LOOP RECORDER IMPLANT;  Surgeon: Evans Lance, MD;  Location: [facility] C[facility] LAB;  Service: Cardiovascular;  Laterality: N/A;  . TONSILLECTOMY      reports that she has never smoked. She has never used smokeless tobacco. She reports that she does not drink alcohol or use drugs. family history includes Breast cancer in her  sister; Colon cancer in her brother; Congestive Heart Failure (age of onset: 64) in her father; Heart attack in her mother; Heart disease in her brother; Heart disease (age of onset: 37) in her mother; Kidney disease in her mother; Uterine cancer in her sister. Allergies  Allergen Reactions  . Nitrofurantoin     unknown  . Sulfamethoxazole-Trimethoprim     unknown  . Sulfonamide Derivatives     REACTION: rash     Review of Systems  Constitutional: Negative for chills and fever.  Respiratory: Negative for shortness  of breath.   Cardiovascular: Negative for chest pain.  Gastrointestinal: Negative for abdominal pain.  Genitourinary: Negative for dysuria and hematuria.  Musculoskeletal: Positive for back pain.  Neurological: Negative for numbness.       Objective:   Physical Exam  Constitutional: She appears well-developed and well-nourished.  Cardiovascular: Normal rate and regular rhythm.   Pulmonary/Chest: Effort normal and breath sounds normal. No respiratory distress. She has no wheezes. She has no rales.  Musculoskeletal: She exhibits no edema.  Patient has no spinal tenderness. She has some bilateral muscular tenderness right lower lumbar area greater than left. Straight leg raises are negative  Neurological:  Full-strength lower extremities. Symmetric reflexes.       Assessment:     #1 bilateral lumbar back pain following fall. Doubt compression fracture. No spinal tenderness. Suspect muscular  #2 chronic insomnia    Plan:     -Sleep hygiene discussed -Avoid benzodiazepines or Ambien -Suggested trial of low-dose melatonin 1-2 mg daily at bedtime -Suggest topical rub such as Biofreeze for her muscular back pain -We recommended considering repeat therapy physical therapy but at this point she declines  Eulas Post MD [facility] at [facility]

## 2016-11-23 ENCOUNTER — Other Ambulatory Visit: Payer: Self-pay | Admitting: Family Medicine

## 2016-12-10 ENCOUNTER — Ambulatory Visit (HOSPITAL_COMMUNITY)
Admission: RE | Admit: 2016-12-10 | Discharge: 2016-12-10 | Disposition: A | Discharge status: Home / Self Care | Payer: Medicare Other | Source: Ambulatory Visit | Attending: Vascular Surgery | Admitting: Vascular Surgery

## 2016-12-10 ENCOUNTER — Ambulatory Visit: Payer: Medicare Other | Admitting: Vascular Surgery

## 2016-12-10 ENCOUNTER — Encounter: Payer: Self-pay | Admitting: Vascular Surgery

## 2016-12-10 ENCOUNTER — Ambulatory Visit (INDEPENDENT_AMBULATORY_CARE_PROVIDER_SITE_OTHER): Payer: Medicare Other | Admitting: Vascular Surgery

## 2016-12-10 ENCOUNTER — Encounter (HOSPITAL_COMMUNITY): Payer: Medicare Other

## 2016-12-10 VITALS — BP 164/69 | HR 69 | Temp 97.4°F | Resp 16 | Ht 66.0 in | Wt 125.0 lb

## 2016-12-10 DIAGNOSIS — I6522 Occlusion and stenosis of left carotid artery: Secondary | ICD-10-CM

## 2016-12-10 DIAGNOSIS — I6523 Occlusion and stenosis of bilateral carotid arteries: Secondary | ICD-10-CM | POA: Insufficient documentation

## 2016-12-10 LAB — VAS US CAROTID
LCCADDIAS: 14 cm/s
LCCAPDIAS: 17 cm/s
LEFT ECA DIAS: -1 cm/s
LEFT VERTEBRAL DIAS: 6 cm/s
LICADSYS: -72 cm/s
LICAPDIAS: -11 cm/s
LICAPSYS: -46 cm/s
Left CCA dist sys: 65 cm/s
Left CCA prox sys: 82 cm/s
Left ICA dist dias: -15 cm/s
RCCAPSYS: 76 cm/s
RIGHT CCA MID DIAS: 15 cm/s
RIGHT ECA DIAS: -2 cm/s
RIGHT VERTEBRAL DIAS: 9 cm/s
Right CCA prox dias: 11 cm/s
Right cca dist sys: -70 cm/s

## 2016-12-10 NOTE — Progress Notes (Signed)
[facility] [place]  Patient name: [name] MRN: [id number] DOB: 01/04/1921 Sex: female  REASON FOR VISIT: Follow-up for carotid disease.  HPI: [name] is a 83 y.o. female here today for follow-up.  She is status post left carotid endarterectomy in October 2012.  She continues to be quite active and independent at her age of 72.  He specifically denies any neurologic deficit.  She denies amaurosis fugax, transient ischemic attack or stroke.  Past Medical History:  Diagnosis Date  . ANEMIA, NORMOCYTIC 11/30/2009  . CAD (coronary artery disease) 04/27/2008   Non-Obstructive >> Cardiac catheterization 6/08: EF 55%, 1+ MR, Dx 50%, LAD 50%, dLAD 40%, OM1 50-60% (up to 70%), pOM 30%, pRCA 40%, mRCA 50-60%, pPDA 50-60% (up to 70%).  Medical therapy was recommended // MV 11/14: Normal stress nuclear study. LV Ejection Fraction: 67%.   . Carotid artery disease ([place])    Left CEA in 10/12.  Carotid dopplers (10/15) with 1-39% bilateral ICA stenosis. // Carotid US 10/17: R < 40; patent L CEA site  . COLONIC POLYPS, ADENOMATOUS 12/26/2009  . DIABETES MELLITUS, TYPE II 08/11/2006  . DIVERTICULOSIS, COLON 08/11/2006  . GERD 02/16/2007  . Hemorrhoids    s/p banding  . History of echocardiogram    Echo 5/18: EF 60-65, no RWMA, Gr 2 DD, mild-mod MR, mod LAE // Echo 2/17: Mild LVH, mild focal basal septal hypertrophy, EF 55-60%, no RWMA, Gr 1 DD, MAC, mild MR, mild  // Echo 4/14: EF 60-65%, normal wall motion, Gr 1 DD, MAC, mild MR, PASP 32, reduced excursion of AV noncoronary cusp.    Marland Kitchen HYPERLIPIDEMIA 09/09/2006  . HYPERTENSION 09/09/2006  . LEFT BUNDLE BRANCH BLOCK 08/11/2006  . OSTEOARTHRITIS 08/11/2006  . OSTEOPOROSIS 08/11/2006  . PAF (paroxysmal atrial fibrillation) ([place]) 04/12/2009   Paroxysmal, only prior documented episode was years ago.  She was not started on anticoagulation back then due to hemorrhoidal bleeding, apparently  profuse.  Event monitor (3/14) showed only NSR.    Marland Kitchen Stroke ([place]) 12-06-13  . Syncope    8/15. EEG (5/15) was unremarkable. // 4/18: ILR neg for arrhythmia - ? orthostatic (no prodrome reported) >> c/b SAH (small)   . TIA 09/02/2006   Echocardiogram 6/12: Mild to moderate MR, trace AI, trace TR, PASP 34, bubble study negative for intracardiac shunting, normal EF (greater than 55%) //  Possible TIA in 10/15 with visual field cut.  MRI (10/15) with no CVA. Linq monitor placed.    Family History  Problem Relation Age of Onset  . Heart disease Mother 54  . Kidney disease Mother   . Heart attack Mother   . Congestive Heart Failure Father 16  . Colon cancer Brother   . Heart disease Brother   . Breast cancer Sister   . Uterine cancer Sister     SOCIAL HISTORY: Social History  Substance Use Topics  . Smoking status: Never Smoker  . Smokeless tobacco: Never Used  . Alcohol use No    Allergies  Allergen Reactions  . Nitrofurantoin     Unknown. Macrobid.   . Sulfamethoxazole-Trimethoprim     unknown  . Sulfonamide Derivatives     REACTION: rash    Current Outpatient Prescriptions  Medication Sig Dispense Refill  . acetaminophen (TYLENOL) 325 MG tablet Take 2 tablets (650 mg total) by mouth every 4 (four) hours as needed for mild pain (or temp > 37.5 C (99.5 F)).    . Ascorbic  Acid (VITAMIN C PO) Take 1 tablet by mouth daily.     Marland Kitchen aspirin EC 81 MG tablet Take 1 tablet (81 mg total) by mouth daily. 90 tablet 3  . atorvastatin (LIPITOR) 20 MG tablet Take 1 tablet (20 mg total) by mouth daily at 6 PM. 90 tablet 3  . calcium carbonate 200 MG capsule Take 200 mg by mouth daily.     . Cholecalciferol (VITAMIN D PO) Take 1 tablet by mouth daily.     . Cyanocobalamin (VITAMIN B 12 PO) Take 1 tablet by mouth daily.     Marland Kitchen docusate sodium (COLACE) 100 MG capsule Take 1 capsule (100 mg total) by mouth 2 (two) times daily. 10 capsule 0  . fish oil-omega-3 fatty acids 1000 MG capsule Take 1  capsule by mouth daily.     Marland Kitchen gabapentin (NEURONTIN) 100 MG capsule Take 100 mg by mouth as needed for pain. Toe pain    . glucose blood test strip Use once daily 100 each 3  . ipratropium-albuterol (DUONEB) 0.5-2.5 (3) MG/3ML SOLN Take 4 times a day and every 4 hours as needed for diff breathing 360 mL 5  . metoprolol tartrate (LOPRESSOR) 25 MG tablet TAKE ONE-HALF (1/2) TABLET TWICE A DAY 90 tablet 1  . Multiple Vitamins-Minerals (MULTIVITAMIN WITH MINERALS) tablet Take 1 tablet by mouth daily.    . ONE TOUCH LANCETS MISC Use daily as directed 200 each 3  . pantoprazole (PROTONIX) 20 MG tablet TAKE 1 TABLET EVERY OTHER DAY 90 tablet 2  . PROAIR RESPICLICK 237 (90 Base) MCG/ACT AEPB Inhale 2 puffs into the lungs every 4 (four) hours as needed. 1 each 3  . traMADol (ULTRAM) 50 MG tablet Take 1 tablet (50 mg total) by mouth every 6 (six) hours as needed for severe pain. 60 tablet 0  . VITAMIN E PO Take 1 tablet by mouth daily.     Marland Kitchen zolpidem (AMBIEN) 10 MG tablet TAKE 1 TABLET BY MOUTH DAILY AT BEDTIME AS NEEDED 90 tablet 0   No current facility-administered medications for this visit.     REVIEW OF SYSTEMS:  [X]  denotes positive finding, [ ]  denotes negative finding Cardiac  Comments:  Chest pain or chest pressure:    Shortness of breath upon exertion:    Short of breath when lying flat:    Irregular heart rhythm:        Vascular    Pain in calf, thigh, or hip brought on by ambulation:    Pain in feet at night that wakes you up from your sleep:     Blood clot in your veins:    Leg swelling:           PHYSICAL EXAM: Vitals:   12/10/16 1151 12/10/16 1153  BP: (!) 149/68 (!) 164/69  Pulse: 69   Resp: 16   Temp: (!) 97.4 F (36.3 C)   TempSrc: Oral   SpO2: 96%   Weight: 125 lb (56.7 kg)   Height: 5' 6"  (1.676 m)     GENERAL: The patient is a well-nourished female, in no acute distress. The vital signs are documented above. CARDIOVASCULAR: Carotid arteries without bruits  bilaterally.  Well-healed left neck incision. PULMONARY: There is good air exchange  MUSCULOSKELETAL: There are no major deformities or cyanosis. NEUROLOGIC: No focal weakness or paresthesias are detected. SKIN: There are no ulcers or rashes noted. PSYCHIATRIC: The patient has a normal affect.  DATA:  Carotid duplex today reveals widely patent left endarterectomy  site and no significant stenosis in her right carotid system.  MEDICAL ISSUES: Stable overall.  We will continue her usual activities.  We will see her again in 1 year with repeat carotid duplex evaluation.    Rosetta Posner, MD FACS [facility] Tel [phone] Pager [phone]

## 2016-12-16 ENCOUNTER — Ambulatory Visit: Payer: Medicare Other | Admitting: Family Medicine

## 2016-12-16 ENCOUNTER — Encounter: Payer: Self-pay | Admitting: Family Medicine

## 2016-12-16 VITALS — BP 110/50 | HR 66 | Temp 97.7°F | Ht 65.0 in | Wt 126.6 lb

## 2016-12-16 DIAGNOSIS — M545 Low back pain, unspecified: Secondary | ICD-10-CM

## 2016-12-16 DIAGNOSIS — I10 Essential (primary) hypertension: Secondary | ICD-10-CM

## 2016-12-16 DIAGNOSIS — E118 Type 2 diabetes mellitus with unspecified complications: Secondary | ICD-10-CM | POA: Diagnosis not present

## 2016-12-16 LAB — HEMOGLOBIN A1C: Hgb A1c MFr Bld: 7.3 % — ABNORMAL HIGH (ref 4.6–6.5)

## 2016-12-16 NOTE — Progress Notes (Signed)
Subjective:     Patient ID: [name], female   DOB: [birth year], 83 y.o.   MRN: [id number]  HPI Patient seen for follow-up regarding her back pain. She has used a topical sports cream and states this is significantly improved. Still has mild pain with flexion and movement/change of position.  She has type 2 diabetes which has been controlled without medication. Last A1c 6.6%. Does not monitor regularly. No polyuria or polydipsia.  Past Medical History:  Diagnosis Date  . ANEMIA, NORMOCYTIC 11/30/2009  . CAD (coronary artery disease) 04/27/2008   Non-Obstructive >> Cardiac catheterization 6/08: EF 55%, 1+ MR, Dx 50%, LAD 50%, dLAD 40%, OM1 50-60% (up to 70%), pOM 30%, pRCA 40%, mRCA 50-60%, pPDA 50-60% (up to 70%).  Medical therapy was recommended // MV 11/14: Normal stress nuclear study. LV Ejection Fraction: 67%.   . Carotid artery disease ([place])    Left CEA in 10/12.  Carotid dopplers (10/15) with 1-39% bilateral ICA stenosis. // Carotid US 10/17: R < 40; patent L CEA site  . COLONIC POLYPS, ADENOMATOUS 12/26/2009  . DIABETES MELLITUS, TYPE II 08/11/2006  . DIVERTICULOSIS, COLON 08/11/2006  . GERD 02/16/2007  . Hemorrhoids    s/p banding  . History of echocardiogram    Echo 5/18: EF 60-65, no RWMA, Gr 2 DD, mild-mod MR, mod LAE // Echo 2/17: Mild LVH, mild focal basal septal hypertrophy, EF 55-60%, no RWMA, Gr 1 DD, MAC, mild MR, mild  // Echo 4/14: EF 60-65%, normal wall motion, Gr 1 DD, MAC, mild MR, PASP 32, reduced excursion of AV noncoronary cusp.    Marland Kitchen HYPERLIPIDEMIA 09/09/2006  . HYPERTENSION 09/09/2006  . LEFT BUNDLE BRANCH BLOCK 08/11/2006  . OSTEOARTHRITIS 08/11/2006  . OSTEOPOROSIS 08/11/2006  . PAF (paroxysmal atrial fibrillation) ([place]) 04/12/2009   Paroxysmal, only prior documented episode was years ago.  She was not started on anticoagulation back then due to hemorrhoidal bleeding, apparently profuse.  Event monitor (3/14) showed only NSR.    Marland Kitchen Stroke ([place]) 12-06-13  . Syncope     8/15. EEG (5/15) was unremarkable. // 4/18: ILR neg for arrhythmia - ? orthostatic (no prodrome reported) >> c/b SAH (small)   . TIA 09/02/2006   Echocardiogram 6/12: Mild to moderate MR, trace AI, trace TR, PASP 34, bubble study negative for intracardiac shunting, normal EF (greater than 55%) //  Possible TIA in 10/15 with visual field cut.  MRI (10/15) with no CVA. Linq monitor placed.   Past Surgical History:  Procedure Laterality Date  . CAROTID ENDARTERECTOMY Left 12-05-2010  . CLOSED REDUCTION METATARSAL FRACTURE     right 5th  . COLONOSCOPY W/ POLYPECTOMY  2001  . EYE SURGERY     catarac  . TONSILLECTOMY      reports that  has never smoked. she has never used smokeless tobacco. She reports that she does not drink alcohol or use drugs. family history includes Breast cancer in her sister; Colon cancer in her brother; Congestive Heart Failure (age of onset: 96) in her father; Heart attack in her mother; Heart disease in her brother; Heart disease (age of onset: 38) in her mother; Kidney disease in her mother; Uterine cancer in her sister. Allergies  Allergen Reactions  . Nitrofurantoin     Unknown. Macrobid.   . Sulfamethoxazole-Trimethoprim     unknown  . Sulfonamide Derivatives     REACTION: rash     Review of Systems  Constitutional: Negative for fatigue.  Eyes: Negative for visual disturbance.  Respiratory: Negative for cough, chest tightness, shortness of breath and wheezing.   Cardiovascular: Negative for chest pain, palpitations and leg swelling.  Endocrine: Negative for polydipsia and polyuria.  Neurological: Negative for dizziness, seizures, syncope, weakness, light-headedness and headaches.       Objective:   Physical Exam  Constitutional: She appears well-developed and well-nourished.  Eyes: Pupils are equal, round, and reactive to light.  Neck: Neck supple. No JVD present. No thyromegaly present.  Cardiovascular: Normal rate and regular rhythm. Exam  reveals no gallop.  Pulmonary/Chest: Effort normal and breath sounds normal. No respiratory distress. She has no wheezes. She has no rales.  Musculoskeletal: She exhibits no edema.  Neurological: She is alert.       Assessment:     #1 type 2 diabetes controlled without medication  #2 recent low back pain gradually improving. Suspect musculoskeletal    Plan:     -Recheck A1c -Continue conservative measures for low back pain -Routine follow-up in 3 months and sooner as needed  Eulas Post MD [facility] at [facility]

## 2016-12-23 ENCOUNTER — Ambulatory Visit: Payer: Medicare Other | Admitting: Family Medicine

## 2016-12-26 NOTE — Addendum Note (Signed)
Addended by: Lianne Cure A on: 12/26/2016 10:15 AM   Modules accepted: Orders

## 2017-02-12 ENCOUNTER — Other Ambulatory Visit: Payer: Self-pay | Admitting: Family Medicine

## 2017-02-12 ENCOUNTER — Ambulatory Visit: Payer: Self-pay | Admitting: *Deleted

## 2017-02-12 NOTE — Telephone Encounter (Signed)
Pt reports "diarrhea" since 02/01/17. Initially 3-4 loose stools daily; 3 currently. No other symptoms, no change in diet. No s/s dehydration; denies dizziness, able to drink fluids, eating normally. Pt declined appt; wants to try Imodium first. Directed to call back if symptoms worsen, monitor urine output. Reviewed signs of dehydration, instructed to drink diluted Gatorade, crackers, clear soups, rice, bananas, gradually increase diet as symptoms subside.  Reason for Disposition . [1] MILD diarrhea (e.g., 1-3 or more stools than normal in past 24 hours) without known cause AND [2] present >  7 days  Answer Assessment - Initial Assessment Questions 1. DIARRHEA SEVERITY: "How bad is the diarrhea?" "How many extra stools have you had in the past 24 hours than normal?"    - MILD: Few loose or mushy BMs; increase of 1-3 stools over normal daily number of stools; mild increase in ostomy output.   - MODERATE: Increase of 4-6 stools daily over normal; moderate increase in ostomy output.   - SEVERE (or Worst Possible): Increase of 7 or more stools daily over normal; moderate increase in ostomy output; incontinence.     Moderate 2. ONSET: "When did the diarrhea begin?"      02/01/17 3. BM CONSISTENCY: "How loose or watery is the diarrhea?"      Watery initially, now loose. 4. VOMITING: "Are you also vomiting?" If so, ask: "How many times in the past 24 hours?"      no 5. ABDOMINAL PAIN: "Are you having any abdominal pain?" If yes: "What does it feel like?" (e.g., crampy, dull, intermittent, constant)      no 6. ABDOMINAL PAIN SEVERITY: If present, ask: "How bad is the pain?"  (e.g., Scale 1-10; mild, moderate, or severe)    - MILD (1-3): doesn't interfere with normal activities, abdomen soft and not tender to touch     - MODERATE (4-7): interferes with normal activities or awakens from sleep, tender to touch     - SEVERE (8-10): excruciating pain, doubled over, unable to do any normal activities    n/a 7. ORAL INTAKE: If vomiting, "Have you been able to drink liquids?" "How much fluids have you had in the past 24 hours?"     n/a 8. HYDRATION: "Any signs of dehydration?" (e.g., dry mouth [not just dry lips], too weak to stand, dizziness, new weight loss) "When did you last urinate?"     no 9. EXPOSURE: "Have you traveled to a foreign country recently?" "Have you been exposed to anyone with diarrhea?" "Could you have eaten any food that was spoiled?"     no 10. OTHER SYMPTOMS: "Do you have any other symptoms?" (e.g., fever, blood in stool) no  Protocols used: DIARRHEA-A-AH

## 2017-02-14 ENCOUNTER — Other Ambulatory Visit: Payer: Self-pay

## 2017-02-14 ENCOUNTER — Telehealth: Payer: Self-pay | Admitting: Family Medicine

## 2017-02-14 ENCOUNTER — Encounter (HOSPITAL_COMMUNITY): Payer: Self-pay | Admitting: Emergency Medicine

## 2017-02-14 ENCOUNTER — Emergency Department (HOSPITAL_COMMUNITY)
Admission: EM | Admit: 2017-02-14 | Discharge: 2017-02-14 | Disposition: A | Discharge status: Home / Self Care | Payer: Medicare Other | Attending: Emergency Medicine | Admitting: Emergency Medicine

## 2017-02-14 DIAGNOSIS — E119 Type 2 diabetes mellitus without complications: Secondary | ICD-10-CM | POA: Diagnosis not present

## 2017-02-14 DIAGNOSIS — Z7982 Long term (current) use of aspirin: Secondary | ICD-10-CM | POA: Diagnosis not present

## 2017-02-14 DIAGNOSIS — I5032 Chronic diastolic (congestive) heart failure: Secondary | ICD-10-CM | POA: Insufficient documentation

## 2017-02-14 DIAGNOSIS — Z79899 Other long term (current) drug therapy: Secondary | ICD-10-CM | POA: Diagnosis not present

## 2017-02-14 DIAGNOSIS — R197 Diarrhea, unspecified: Secondary | ICD-10-CM | POA: Diagnosis not present

## 2017-02-14 DIAGNOSIS — I110 Hypertensive heart disease with heart failure: Secondary | ICD-10-CM | POA: Diagnosis not present

## 2017-02-14 DIAGNOSIS — I2510 Atherosclerotic heart disease of native coronary artery without angina pectoris: Secondary | ICD-10-CM | POA: Insufficient documentation

## 2017-02-14 LAB — URINALYSIS, ROUTINE W REFLEX MICROSCOPIC
BILIRUBIN URINE: NEGATIVE
Glucose, UA: NEGATIVE mg/dL
HGB URINE DIPSTICK: NEGATIVE
KETONES UR: NEGATIVE mg/dL
Nitrite: NEGATIVE
PROTEIN: NEGATIVE mg/dL
Specific Gravity, Urine: 1.017 (ref 1.005–1.030)
pH: 5 (ref 5.0–8.0)

## 2017-02-14 LAB — COMPREHENSIVE METABOLIC PANEL
ALBUMIN: 3.1 g/dL — AB (ref 3.5–5.0)
ALT: 14 U/L (ref 14–54)
AST: 28 U/L (ref 15–41)
Alkaline Phosphatase: 42 U/L (ref 38–126)
Anion gap: 8 (ref 5–15)
BUN: 21 mg/dL — AB (ref 6–20)
CO2: 24 mmol/L (ref 22–32)
Calcium: 8.8 mg/dL — ABNORMAL LOW (ref 8.9–10.3)
Chloride: 105 mmol/L (ref 101–111)
Creatinine, Ser: 1.08 mg/dL — ABNORMAL HIGH (ref 0.44–1.00)
GFR calc Af Amer: 49 mL/min — ABNORMAL LOW (ref >60)
GFR, EST NON AFRICAN AMERICAN: 43 mL/min — AB (ref >60)
Glucose, Bld: 141 mg/dL — ABNORMAL HIGH (ref 65–99)
Potassium: 4.1 mmol/L (ref 3.5–5.1)
Sodium: 137 mmol/L (ref 135–145)
Total Bilirubin: 0.7 mg/dL (ref 0.3–1.2)
Total Protein: 7.3 g/dL (ref 6.5–8.1)

## 2017-02-14 LAB — CBC
HEMATOCRIT: 34 % — AB (ref 36.0–46.0)
Hemoglobin: 10.4 g/dL — ABNORMAL LOW (ref 12.0–15.0)
MCH: 29.9 pg (ref 26.0–34.0)
MCHC: 30.6 g/dL (ref 30.0–36.0)
MCV: 97.7 fL (ref 78.0–100.0)
Platelets: 227 10*3/uL (ref 150–400)
RBC: 3.48 MIL/uL — ABNORMAL LOW (ref 3.87–5.11)
RDW: 13.7 % (ref 11.5–15.5)
WBC: 9.5 10*3/uL (ref 4.0–10.5)

## 2017-02-14 LAB — LIPASE, BLOOD: Lipase: 51 U/L (ref 11–51)

## 2017-02-14 LAB — CBG MONITORING, ED: GLUCOSE-CAPILLARY: 137 mg/dL — AB (ref 65–99)

## 2017-02-14 MED ORDER — SODIUM CHLORIDE 0.9 % IV BOLUS (SEPSIS)
500.0000 mL | Freq: Once | INTRAVENOUS | Status: AC
  Administered 2017-02-14: 500 mL via INTRAVENOUS

## 2017-02-14 NOTE — ED Provider Notes (Signed)
[facility] EMERGENCY DEPARTMENT Provider Note   CSN: [id number] Arrival date & time: 02/14/17  1333     History   Chief Complaint Chief Complaint  Patient presents with  . Diarrhea    HPI [name] is a 84 y.o. female.  HPI  Patient presents with complaint of loose bowel movements and diarrhea over the past 2 weeks.  She states she has had 1-3 loose stools per day for the past 2 weeks.  She denies any abdominal pain.  No fever or vomiting.  She states that every time she eats or drinks she has a loose stool.  No dysuria.  She was advised to come to the ED by her primary care doctor today to be evaluated for dehydration.  She has not been on any recent antibiotics.  She has no known sick contacts or recent travel.   Stool is without blood or mucous. There are no other associated systemic symptoms, there are no other alleviating or modifying factors.   Past Medical History:  Diagnosis Date  . ANEMIA, NORMOCYTIC 11/30/2009  . CAD (coronary artery disease) 04/27/2008   Non-Obstructive >> Cardiac catheterization 6/08: EF 55%, 1+ MR, Dx 50%, LAD 50%, dLAD 40%, OM1 50-60% (up to 70%), pOM 30%, pRCA 40%, mRCA 50-60%, pPDA 50-60% (up to 70%).  Medical therapy was recommended // MV 11/14: Normal stress nuclear study. LV Ejection Fraction: 67%.   . Carotid artery disease ([place])    Left CEA in 10/12.  Carotid dopplers (10/15) with 1-39% bilateral ICA stenosis. // Carotid US 10/17: R < 40; patent L CEA site  . COLONIC POLYPS, ADENOMATOUS 12/26/2009  . DIABETES MELLITUS, TYPE II 08/11/2006  . DIVERTICULOSIS, COLON 08/11/2006  . GERD 02/16/2007  . Hemorrhoids    s/p banding  . History of echocardiogram    Echo 5/18: EF 60-65, no RWMA, Gr 2 DD, mild-mod MR, mod LAE // Echo 2/17: Mild LVH, mild focal basal septal hypertrophy, EF 55-60%, no RWMA, Gr 1 DD, MAC, mild MR, mild  // Echo 4/14: EF 60-65%, normal wall motion, Gr 1 DD, MAC, mild MR, PASP 32, reduced excursion of AV  noncoronary cusp.    Marland Kitchen HYPERLIPIDEMIA 09/09/2006  . HYPERTENSION 09/09/2006  . LEFT BUNDLE BRANCH BLOCK 08/11/2006  . OSTEOARTHRITIS 08/11/2006  . OSTEOPOROSIS 08/11/2006  . PAF (paroxysmal atrial fibrillation) ([place]) 04/12/2009   Paroxysmal, only prior documented episode was years ago.  She was not started on anticoagulation back then due to hemorrhoidal bleeding, apparently profuse.  Event monitor (3/14) showed only NSR.    Marland Kitchen Stroke ([place]) 12-06-13  . Syncope    8/15. EEG (5/15) was unremarkable. // 4/18: ILR neg for arrhythmia - ? orthostatic (no prodrome reported) >> c/b SAH (small)   . TIA 09/02/2006   Echocardiogram 6/12: Mild to moderate MR, trace AI, trace TR, PASP 34, bubble study negative for intracardiac shunting, normal EF (greater than 55%) //  Possible TIA in 10/15 with visual field cut.  MRI (10/15) with no CVA. Linq monitor placed.    Patient Active Problem List   Diagnosis Date Noted  . Acute on chronic respiratory failure with hypoxia ([place]) 09/06/2016  . Fall 09/06/2016  . Chronic diastolic (congestive) heart failure ([place]) 09/06/2016  . Open fracture of nasal bones   . Traumatic subarachnoid bleed with LOC of 30 minutes or less, initial encounter ([place]) 06/03/2016  . ILD (interstitial lung disease) ([place]) 04/06/2015  . Cough 04/06/2015  . Shortness of breath 03/15/2015  .  Dyspnea 03/15/2015  . SOB (shortness of breath) 03/09/2015  . Perennial allergic rhinitis 01/17/2014  . Transient alteration of awareness 11/04/2013  . Syncope 10/15/2013  . Chronic insomnia 06/30/2013  . Aftercare following surgery of the circulatory system, [place] 06/22/2013  . Seasonal allergies 06/08/2013  . Normocytic anemia 10/29/2012  . Benign paroxysmal positional vertigo 06/16/2012  . Vertigo 03/23/2012  . Carotid artery disease ([place]) 10/18/2010  . Amaurosis fugax 10/18/2010  . Palpitations 06/14/2010  . KNEE PAIN 01/24/2010  . COLONIC POLYPS, ADENOMATOUS 12/26/2009  . Internal and external  bleeding hemorrhoids 12/26/2009  . CONSTIPATION 12/26/2009  . ANEMIA, NORMOCYTIC 11/30/2009  . FATIGUE / MALAISE 11/27/2009  . PAF (paroxysmal atrial fibrillation) ([place]) 04/12/2009  . COLONIC POLYPS, HYPERPLASTIC, HX OF 03/01/2009  . CAD (coronary artery disease) 04/27/2008  . GERD 02/16/2007  . Hyperlipidemia 09/09/2006  . Essential hypertension 09/09/2006  . Transient cerebral ischemia 09/02/2006  . Type 2 diabetes mellitus, controlled ([place]) 08/11/2006  . LEFT BUNDLE BRANCH BLOCK 08/11/2006  . DIVERTICULOSIS, COLON 08/11/2006  . Osteoarthritis 08/11/2006  . OSTEOPOROSIS 08/11/2006    Past Surgical History:  Procedure Laterality Date  . CAROTID ENDARTERECTOMY Left 12-05-2010  . CLOSED REDUCTION METATARSAL FRACTURE     right 5th  . COLONOSCOPY W/ POLYPECTOMY  2001  . EYE SURGERY     catarac  . LOOP RECORDER IMPLANT N/A 01/03/2014   Procedure: LOOP RECORDER IMPLANT;  Surgeon: Evans Lance, MD;  Location: [facility] CATH LAB;  Service: Cardiovascular;  Laterality: N/A;  . TONSILLECTOMY      OB History    No data available       Home Medications    Prior to Admission medications   Medication Sig Start Date End Date Taking? Authorizing Provider  acetaminophen (TYLENOL) 325 MG tablet Take 2 tablets (650 mg total) by mouth every 4 (four) hours as needed for mild pain (or temp > 37.5 C (99.5 F)). 06/04/16   Geradine Girt, DO  Ascorbic Acid (VITAMIN C PO) Take 1 tablet by mouth daily.     [provider]  aspirin EC 81 MG tablet Take 1 tablet (81 mg total) by mouth daily. 07/05/16   [name], PA-C  atorvastatin (LIPITOR) 20 MG tablet Take 1 tablet (20 mg total) by mouth daily at 6 PM. 06/19/16   [name], PA-C  calcium carbonate 200 MG capsule Take 200 mg by mouth daily.     [provider]  Cholecalciferol (VITAMIN D PO) Take 1 tablet by mouth daily.     [provider]  Cyanocobalamin (VITAMIN B 12 PO) Take 1 tablet by mouth daily.      [provider]  docusate sodium (COLACE) 100 MG capsule Take 1 capsule (100 mg total) by mouth 2 (two) times daily. 06/04/16   Geradine Girt, DO  fish oil-omega-3 fatty acids 1000 MG capsule Take 1 capsule by mouth daily.     [provider]  gabapentin (NEURONTIN) 100 MG capsule Take 100 mg by mouth as needed for pain. Toe pain 08/27/16   [provider]  glucose blood test strip Use once daily 12/01/14   Eulas Post, MD  ipratropium-albuterol (DUONEB) 0.5-2.5 (3) MG/3ML SOLN Take 4 times a day and every 4 hours as needed for diff breathing 04/11/15   [place], [place], MD  metoprolol tartrate (LOPRESSOR) 25 MG tablet TAKE ONE-HALF (1/2) TABLET TWICE A DAY 11/25/16   Burchette, Alinda Sierras, MD  Multiple Vitamins-Minerals (  MULTIVITAMIN WITH MINERALS) tablet Take 1 tablet by mouth daily.    [provider]  ONE TOUCH LANCETS MISC Use daily as directed 03/25/11   Eulas Post, MD  pantoprazole (PROTONIX) 20 MG tablet TAKE 1 TABLET EVERY OTHER DAY 11/25/16   Burchette, Alinda Sierras, MD  PROAIR RESPICLICK [phone] Base) MCG/ACT AEPB Inhale 2 puffs into the lungs every 4 (four) hours as needed. 04/04/15   Burchette, Alinda Sierras, MD  traMADol (ULTRAM) 50 MG tablet Take 1 tablet (50 mg total) by mouth every 6 (six) hours as needed for severe pain. 10/23/16   Burchette, Alinda Sierras, MD  VITAMIN E PO Take 1 tablet by mouth daily.     [provider]  zolpidem (AMBIEN) 10 MG tablet TAKE 1 TABLET BY MOUTH DAILY AT BEDTIME AS NEEDED 11/15/16   Lucretia Kern, DO    Family History Family History  Problem Relation Age of Onset  . Heart disease Mother 75  . Kidney disease Mother   . Heart attack Mother   . Congestive Heart Failure Father 83  . Colon cancer Brother   . Heart disease Brother   . Breast cancer Sister   . Uterine cancer Sister     Social History Social History   Tobacco Use  . Smoking status: Never Smoker  . Smokeless tobacco: Never Used    Substance Use Topics  . Alcohol use: No    Alcohol/week: 0.0 oz  . Drug use: No     Allergies   Nitrofurantoin; Sulfamethoxazole-trimethoprim; and Sulfonamide derivatives   Review of Systems Review of Systems  ROS reviewed and all otherwise negative except for mentioned in HPI   Physical Exam Updated Vital Signs BP (!) 170/74 (BP Location: Right Arm)   Pulse 78   Temp 97.8 F (36.6 C) (Oral)   Resp 16   Ht _0  (1.676 m)   Wt 54 kg (119 lb)   SpO2 95%   BMI 19.21 kg/m  Vitals reviewed Physical Exam  Physical Examination: General appearance - alert, well appearing, and in no distress Mental status - alert, oriented to person, place, and time Eyes - no conjunctival injection, no scleral icterus Mouth - mucous membranes moist, pharynx normal without lesions Neck - supple, no significant adenopathy Chest - clear to auscultation, no wheezes, rales or rhonchi, symmetric air entry Heart - normal rate, regular rhythm, normal S1, S2, no murmurs, rubs, clicks or gallops Abdomen - soft, nontender, nondistended, no masses or organomegaly Neurological - alert, oriented, normal speech, Extremities - peripheral pulses normal, no pedal edema, no clubbing or cyanosis Skin - normal coloration and turgor, no rashes   ED Treatments / Results  Labs (all labs ordered are listed, but only abnormal results are displayed) Labs Reviewed  COMPREHENSIVE METABOLIC PANEL - Abnormal; Notable for the following components:      Result Value   Glucose, Bld 141 (*)    BUN 21 (*)    Creatinine, Ser 1.08 (*)    Calcium 8.8 (*)    Albumin 3.1 (*)    GFR calc non Af Amer 43 (*)    GFR calc Af Amer 49 (*)    All other components within normal limits  CBC - Abnormal; Notable for the following components:   RBC 3.48 (*)    Hemoglobin 10.4 (*)    HCT 34.0 (*)    All other components within normal limits  URINALYSIS, ROUTINE W REFLEX MICROSCOPIC - Abnormal; Notable for the following components:  APPearance HAZY (*)    Leukocytes, UA LARGE (*)    Bacteria, UA RARE (*)    Squamous Epithelial / LPF 0-5 (*)    All other components within normal limits  CBG MONITORING, ED - Abnormal; Notable for the following components:   Glucose-Capillary 137 (*)    All other components within normal limits  GASTROINTESTINAL PANEL BY PCR, STOOL (REPLACES STOOL CULTURE)  C DIFFICILE QUICK SCREEN W PCR REFLEX  URINE CULTURE  LIPASE, BLOOD    EKG  EKG Interpretation None       Radiology No results found.  Procedures Procedures (including critical care time)  Medications Ordered in ED Medications  sodium chloride 0.9 % bolus 500 mL (0 mLs Intravenous Stopped 02/14/17 2114)     Initial Impression / Assessment and Plan / ED Course  I have reviewed the triage vital signs and the nursing notes.  Pertinent labs & imaging results that were available during my care of the patient were reviewed by me and considered in my medical decision making (see chart for details).     Patient presenting with complaint of diarrhea.  She has had 1-2 loose stools per day over the past 2 weeks.  Her abdominal exam is benign.  She does not appear toxic or dehydrated.  Her labs are reassuring.  Patient was given small IV fluid bolus.  Urine culture was sent for suspected dirty urine.  Stool sample for GI pathogen requested but patient was not able to provide this while in the ED.  Sent home with specimen cup.  Advise close follow-up with primary care doctor.  Discharged with strict return precautions.  Pt agreeable with plan.  Final Clinical Impressions(s) / ED Diagnoses   Final diagnoses:  Diarrhea, unspecified type    ED Discharge Orders    None       Pixie Casino, MD 02/14/17 2246

## 2017-02-14 NOTE — Discharge Instructions (Signed)
Return to the ED with any concerns including abdominal pain, vomiting, fever/chills, fainting, decreased level of alertness/lethargy, or any other alarming symptoms  You should obtain a stool sample and return it to the lab if you continue to have liquid/watery stool

## 2017-02-14 NOTE — ED Provider Notes (Signed)
Patient placed in Quick Look pathway, seen and evaluated for chief complaint of diarrhea x 2 weeks.  Pertinent H&P findings include no abdominal pain. No hematochezia.  No recent ABX or travel. Based on initial evaluation, labs are indicated and radiology studies not indicated.  Patient counseled on process, plan, and necessity for staying for completing the evaluation.    Shary Decamp, PA-C 02/14/17 1428    Pattricia Boss, MD 02/14/17 [phone]

## 2017-02-14 NOTE — Telephone Encounter (Signed)
Returned pt call, pt stated that she has had diarrhea for more than a week, she denies feeling weak, nausea and vomiting. Pt states that she is not feeling dehydrated, Advised pt that dr Elease Hashimoto is recommending for her to go to the ED where she can get some IV fluids/ Electrolytes. Patient stated that she will go this afternoon.

## 2017-02-14 NOTE — Telephone Encounter (Signed)
Copied from [place] [id number]. Topic: Appointment Scheduling - Same Day Appointment >> Feb 14, 2017 10:34 AM Percell Belt A wrote: Reason for CRM: pt would like for Dr Elease Hashimoto to work her in day.  Tried to sch with someone else or other options.  She wanted me to send note to see if she could be worked in?    Patient called to schedule an appointment for TODAY with pt has had diarrhea for days and is  week   [phone]

## 2017-02-14 NOTE — Telephone Encounter (Signed)
IF she is not feeling weak, nausea/vomiting,dizzy, etc, has she tried OTC Imodium?  May consider that if keeping fluids down.

## 2017-02-14 NOTE — ED Triage Notes (Signed)
Pt reports diarrhea x 2 weeks, denies n/v, fevers, abd pain.  Pt reports speaking with PCP office and being told to come to ED to rule out dehydration.

## 2017-02-14 NOTE — Telephone Encounter (Signed)
Pt. And son called to ask why she needs to go to ED. Explained Dr. Elease Hashimoto felt this was the proper course of action for her reported illness. Pt. And son request ED should be called and told she is coming. Told them this is not necessary.

## 2017-02-16 LAB — GASTROINTESTINAL PANEL BY PCR, STOOL (REPLACES STOOL CULTURE)

## 2017-02-16 LAB — URINE CULTURE

## 2017-02-17 NOTE — Telephone Encounter (Signed)
Stool studies were negative and labs were basically unremarkable. If she is feeling better and diarrhea resolving no need to follow-up at this time

## 2017-02-17 NOTE — Telephone Encounter (Signed)
Pt went to the ED on Friday, States that she is still having diarrhea but is taking Imodium OTC, Pt denies feeling weak, nausea/vomiting or dizzy. Pt states that she had lab work in the hospital and was Advised to follow up with PCP. Please advice if she needs to make a follow up appointment to see you. See discharge summary in the chart.

## 2017-02-18 ENCOUNTER — Ambulatory Visit: Payer: Self-pay | Admitting: *Deleted

## 2017-02-18 NOTE — Telephone Encounter (Signed)
Called in c/o continued diarrhea.  Today she was incontinent of stool and messed up her clothes.  It hit her all of a sudden today.   "I exploded".    Denies any other symptoms.  No pain, etc.   She is drinking fluids and eating.   She was seen in the ED at [facility] on Jan. 4th.   Given some IV fluids. An appt was made for her with Dr. Elease Hashimoto for 02/19/17 at 5:00 by the agent.   I instructed her to go to the ED if she started feeling dizzy or unable to keep fluids down.   She verbalized understanding.    Reason for Disposition . [1] MODERATE diarrhea (e.g., 4-6 times / day more than normal) AND [2] age > 70 years  Answer Assessment - Initial Assessment Questions 1. DIARRHEA SEVERITY: "How bad is the diarrhea?" "How many extra stools have you had in the past 24 hours than normal?"    - MILD: Few loose or mushy BMs; increase of 1-3 stools over normal daily number of stools; mild increase in ostomy output.   - MODERATE: Increase of 4-6 stools daily over normal; moderate increase in ostomy output.   - SEVERE (or Worst Possible): Increase of 7 or more stools daily over normal; moderate increase in ostomy output; incontinence.     I've had watery stool and it bursts out.   I had 3 today. 2. ONSET: "When did the diarrhea begin?"      Been since right after Christmas 3. BM CONSISTENCY: "How loose or watery is the diarrhea?"      Watery.  I try to eat and it makes me go to the bathroom.   It hits in a hurry and I have to go bad and in a hurry. 4. VOMITING: "Are you also vomiting?" If so, ask: "How many times in the past 24 hours?"      No 5. ABDOMINAL PAIN: "Are you having any abdominal pain?" If yes: "What does it feel like?" (e.g., crampy, dull, intermittent, constant)      No pain 6. ABDOMINAL PAIN SEVERITY: If present, ask: "How bad is the pain?"  (e.g., Scale 1-10; mild, moderate, or severe)    - MILD (1-3): doesn't interfere with normal activities, abdomen soft and not tender to touch     - MODERATE  (4-7): interferes with normal activities or awakens from sleep, tender to touch     - SEVERE (8-10): excruciating pain, doubled over, unable to do any normal activities       It hits me in a hurry.  I just messed up my clothes because I could not make it to the bathroom in time. 7. ORAL INTAKE: If vomiting, "Have you been able to drink liquids?" "How much fluids have you had in the past 24 hours?"     I had 3 8 oz glasses of fluids and tea.   I keep drinking.   This morning had cereal, oatmeal and an omelette    Ham and cheese sandwich.   8. HYDRATION: "Any signs of dehydration?" (e.g., dry mouth [not just dry lips], too weak to stand, dizziness, new weight loss) "When did you last urinate?"     No.   I feel fine after I "explode".   Yes  I'm urinating fine. 9. EXPOSURE: "Have you traveled to a foreign country recently?" "Have you been exposed to anyone with diarrhea?" "Could you have eaten any food that was spoiled?"  No   Not that I know of 10. OTHER SYMPTOMS: "Do you have any other symptoms?" (e.g., fever, blood in stool)       No blood.   Just yellow.    Don't feel like I have fever 11. PREGNANCY: "Is there any chance you are pregnant?" "When was your last menstrual period?"       Not asked due to age  Protocols used: DIARRHEA-A-AH

## 2017-02-18 NOTE — Telephone Encounter (Signed)
Spoke with pt explained that the stool studies were negative, pt states that she is still having diarrhea bad and needs Advise on what she needs to do since she has tried all options, states that Imodium is not working.

## 2017-02-18 NOTE — Telephone Encounter (Signed)
I think we need to go ahead and check stool PCR for C. Difficile-as soon as she can come in for it.  See if we can get her in for follow up in next 2-3 days.

## 2017-02-19 ENCOUNTER — Encounter: Payer: Self-pay | Admitting: Family Medicine

## 2017-02-19 ENCOUNTER — Ambulatory Visit (INDEPENDENT_AMBULATORY_CARE_PROVIDER_SITE_OTHER): Payer: Medicare Other | Admitting: Family Medicine

## 2017-02-19 VITALS — BP 150/70 | Temp 98.1°F | Wt 125.2 lb

## 2017-02-19 DIAGNOSIS — E118 Type 2 diabetes mellitus with unspecified complications: Secondary | ICD-10-CM | POA: Diagnosis not present

## 2017-02-19 DIAGNOSIS — R197 Diarrhea, unspecified: Secondary | ICD-10-CM | POA: Diagnosis not present

## 2017-02-19 NOTE — Patient Instructions (Signed)
Food Choices to Help Relieve Diarrhea, Adult When you have diarrhea, the foods you eat and your eating habits are very important. Choosing the right foods and drinks can help:  Relieve diarrhea.  Replace lost fluids and nutrients.  Prevent dehydration.  What general guidelines should I follow? Relieving diarrhea  Choose foods with less than 2 g or .07 oz. of fiber per serving.  Limit fats to less than 8 tsp (38 g or 1.34 oz.) a day.  Avoid the following: ? Foods and beverages sweetened with high-fructose corn syrup, honey, or sugar alcohols such as xylitol, sorbitol, and mannitol. ? Foods that contain a lot of fat or sugar. ? Fried, greasy, or spicy foods. ? High-fiber grains, breads, and cereals. ? Raw fruits and vegetables.  Eat foods that are rich in probiotics. These foods include dairy products such as yogurt and fermented milk products. They help increase healthy bacteria in the stomach and intestines (gastrointestinal tract, or GI tract).  If you have lactose intolerance, avoid dairy products. These may make your diarrhea worse.  Take medicine to help stop diarrhea (antidiarrheal medicine) only as told by your health care provider. Replacing nutrients  Eat small meals or snacks every 3-4 hours.  Eat bland foods, such as white rice, toast, or baked potato, until your diarrhea starts to get better. Gradually reintroduce nutrient-rich foods as tolerated or as told by your health care provider. This includes: ? Well-cooked protein foods. ? Peeled, seeded, and soft-cooked fruits and vegetables. ? Low-fat dairy products.  Take vitamin and mineral supplements as told by your health care provider. Preventing dehydration   Start by sipping water or a special solution to prevent dehydration (oral rehydration solution, ORS). Urine that is clear or pale yellow means that you are getting enough fluid.  Try to drink at least 8-10 cups of fluid each day to help replace lost  fluids.  You may add other liquids in addition to water, such as clear juice or decaffeinated sports drinks, as tolerated or as told by your health care provider.  Avoid drinks with caffeine, such as coffee, tea, or soft drinks.  Avoid alcohol. What foods are recommended? The items listed may not be a complete list. Talk with your health care provider about what dietary choices are best for you. Grains White rice. White, French, or pita breads (fresh or toasted), including plain rolls, buns, or bagels. White pasta. Saltine, soda, or graham crackers. Pretzels. Low-fiber cereal. Cooked cereals made with water (such as cornmeal, farina, or cream cereals). Plain muffins. Matzo. Melba toast. Zwieback. Vegetables Potatoes (without the skin). Most well-cooked and canned vegetables without skins or seeds. Tender lettuce. Fruits Apple sauce. Fruits canned in juice. Cooked apricots, cherries, grapefruit, peaches, pears, or plums. Fresh bananas and cantaloupe. Meats and other protein foods Baked or boiled chicken. Eggs. Tofu. Fish. Seafood. Smooth nut butters. Ground or well-cooked tender beef, ham, veal, lamb, pork, or poultry. Dairy Plain yogurt, kefir, and unsweetened liquid yogurt. Lactose-free milk, buttermilk, skim milk, or soy milk. Low-fat or nonfat hard cheese. Beverages Water. Low-calorie sports drinks. Fruit juices without pulp. Strained tomato and vegetable juices. Decaffeinated teas. Sugar-free beverages not sweetened with sugar alcohols. Oral rehydration solutions, if approved by your health care provider. Seasoning and other foods Bouillon, broth, or soups made from recommended foods. What foods are not recommended? The items listed may not be a complete list. Talk with your health care provider about what dietary choices are best for you. Grains Whole grain, whole wheat,   bran, or rye breads, rolls, pastas, and crackers. Wild or brown rice. Whole grain or bran cereals. Barley. Oats and  oatmeal. Corn tortillas or taco shells. Granola. Popcorn. Vegetables Raw vegetables. Fried vegetables. Cabbage, broccoli, Brussels sprouts, artichokes, baked beans, beet greens, corn, kale, legumes, peas, sweet potatoes, and yams. Potato skins. Cooked spinach and cabbage. Fruits Dried fruit, including raisins and dates. Raw fruits. Stewed or dried prunes. Canned fruits with syrup. Meat and other protein foods Fried or fatty meats. Deli meats. Chunky nut butters. Nuts and seeds. Beans and lentils. Bacon. Hot dogs. Sausage. Dairy High-fat cheeses. Whole milk, chocolate milk, and beverages made with milk, such as milk shakes. Half-and-half. Cream. sour cream. Ice cream. Beverages Caffeinated beverages (such as coffee, tea, soda, or energy drinks). Alcoholic beverages. Fruit juices with pulp. Prune juice. Soft drinks sweetened with high-fructose corn syrup or sugar alcohols. High-calorie sports drinks. Fats and oils Butter. Cream sauces. Margarine. Salad oils. Plain salad dressings. Olives. Avocados. Mayonnaise. Sweets and desserts Sweet rolls, doughnuts, and sweet breads. Sugar-free desserts sweetened with sugar alcohols such as xylitol and sorbitol. Seasoning and other foods Honey. Hot sauce. Chili powder. Gravy. Cream-based or milk-based soups. Pancakes and waffles. Summary  When you have diarrhea, the foods you eat and your eating habits are very important.  Make sure you get at least 8-10 cups of fluid each day, or enough to keep your urine clear or pale yellow.  Eat bland foods and gradually reintroduce healthy, nutrient-rich foods as tolerated, or as told by your health care provider.  Avoid high-fiber, fried, greasy, or spicy foods. This information is not intended to replace advice given to you by your health care provider. Make sure you discuss any questions you have with your health care provider. Document Released: 04/20/2003 Document Revised: 01/26/2016 Document Reviewed:  01/26/2016 Elsevier Interactive Patient Education  2018 [employer] Diarrhea, Adult Diarrhea is frequent loose and watery bowel movements. Diarrhea can make you feel weak and cause you to become dehydrated. Dehydration can make you tired and thirsty, cause you to have a dry mouth, and decrease how often you urinate. Diarrhea typically lasts 2-3 days. However, it can last longer if it is a sign of something more serious. It is important to treat your diarrhea as told by your health care provider. Follow these instructions at home: Eating and drinking  Follow these recommendations as told by your health care provider:  Take an oral rehydration solution (ORS). This is a drink that is sold at pharmacies and retail stores.  Drink clear fluids, such as water, ice chips, diluted fruit juice, and low-calorie sports drinks.  Eat bland, easy-to-digest foods in small amounts as you are able. These foods include bananas, applesauce, rice, lean meats, toast, and crackers.  Avoid drinking fluids that contain a lot of sugar or caffeine, such as energy drinks, sports drinks, and soda.  Avoid alcohol.  Avoid spicy or fatty foods.  General instructions  Drink enough fluid to keep your urine clear or pale yellow.  Wash your hands often. If soap and water are not available, use hand sanitizer.  Make sure that all people in your household wash their hands well and often.  Take over-the-counter and prescription medicines only as told by your health care provider.  Rest at home while you recover.  Watch your condition for any changes.  Take a warm bath to relieve any burning or pain from frequent diarrhea episodes.  Keep all follow-up visits as told by your health care provider.   This is important. Contact a health care provider if:  You have a fever.  Your diarrhea gets worse.  You have new symptoms.  You cannot keep fluids down.  You feel light-headed or dizzy.  You have a  headache  You have muscle cramps. Get help right away if:  You have chest pain.  You feel extremely weak or you faint.  You have bloody or black stools or stools that look like tar.  You have severe pain, cramping, or bloating in your abdomen.  You have trouble breathing or you are breathing very quickly.  Your heart is beating very quickly.  Your skin feels cold and clammy.  You feel confused.  You have signs of dehydration, such as: ? Dark urine, very little urine, or no urine. ? Cracked lips. ? Dry mouth. ? Sunken eyes. ? Sleepiness. ? Weakness. This information is not intended to replace advice given to you by your health care provider. Make sure you discuss any questions you have with your health care provider. Document Released: 01/18/2002 Document Revised: 06/08/2015 Document Reviewed: 10/04/2014 Elsevier Interactive Patient Education  2018 Elsevier Inc.  

## 2017-02-19 NOTE — Progress Notes (Signed)
Subjective:     Patient ID: [name], female   DOB: [birth year], 84 y.o.   MRN: [id number]  HPI Patient seen with some persistent diarrhea. She's had a few weeks of diarrhea now. She states initially had some constipation and was taking some stool softener. She had what she describes as a fairly large volume bowel movement and since that time has had about one to 2 loose to watery stools per day. She's had poor appetite but weight is not much changed compared to November. She describes 1-2 watery to loose stools per day. No bloody stools. Went to ER on January 4 and had several labs. Blood work unremarkable. She had GI pathogen panel which was negative.  No recent travels. No recent antibiotics. No fevers or chills. No nausea or vomiting. No bloody stools. Using Imodium with minimal relief.  Normal TSH 4/18.  Type 2 diabetes.  Not on metformin.  Past Medical History:  Diagnosis Date  . ANEMIA, NORMOCYTIC 11/30/2009  . CAD (coronary artery disease) 04/27/2008   Non-Obstructive >> Cardiac catheterization 6/08: EF 55%, 1+ MR, Dx 50%, LAD 50%, dLAD 40%, OM1 50-60% (up to 70%), pOM 30%, pRCA 40%, mRCA 50-60%, pPDA 50-60% (up to 70%).  Medical therapy was recommended // MV 11/14: Normal stress nuclear study. LV Ejection Fraction: 67%.   . Carotid artery disease ([place])    Left CEA in 10/12.  Carotid dopplers (10/15) with 1-39% bilateral ICA stenosis. // Carotid US 10/17: R < 40; patent L CEA site  . COLONIC POLYPS, ADENOMATOUS 12/26/2009  . DIABETES MELLITUS, TYPE II 08/11/2006  . DIVERTICULOSIS, COLON 08/11/2006  . GERD 02/16/2007  . Hemorrhoids    s/p banding  . History of echocardiogram    Echo 5/18: EF 60-65, no RWMA, Gr 2 DD, mild-mod MR, mod LAE // Echo 2/17: Mild LVH, mild focal basal septal hypertrophy, EF 55-60%, no RWMA, Gr 1 DD, MAC, mild MR, mild  // Echo 4/14: EF 60-65%, normal wall motion, Gr 1 DD, MAC, mild MR, PASP 32, reduced excursion of AV noncoronary cusp.    Marland Kitchen HYPERLIPIDEMIA  09/09/2006  . HYPERTENSION 09/09/2006  . LEFT BUNDLE BRANCH BLOCK 08/11/2006  . OSTEOARTHRITIS 08/11/2006  . OSTEOPOROSIS 08/11/2006  . PAF (paroxysmal atrial fibrillation) ([place]) 04/12/2009   Paroxysmal, only prior documented episode was years ago.  She was not started on anticoagulation back then due to hemorrhoidal bleeding, apparently profuse.  Event monitor (3/14) showed only NSR.    Marland Kitchen Stroke ([place]) 12-06-13  . Syncope    8/15. EEG (5/15) was unremarkable. // 4/18: ILR neg for arrhythmia - ? orthostatic (no prodrome reported) >> c/b SAH (small)   . TIA 09/02/2006   Echocardiogram 6/12: Mild to moderate MR, trace AI, trace TR, PASP 34, bubble study negative for intracardiac shunting, normal EF (greater than 55%) //  Possible TIA in 10/15 with visual field cut.  MRI (10/15) with no CVA. Linq monitor placed.   Past Surgical History:  Procedure Laterality Date  . CAROTID ENDARTERECTOMY Left 12-05-2010  . CLOSED REDUCTION METATARSAL FRACTURE     right 5th  . COLONOSCOPY W/ POLYPECTOMY  2001  . EYE SURGERY     catarac  . LOOP RECORDER IMPLANT N/A 01/03/2014   Procedure: LOOP RECORDER IMPLANT;  Surgeon: Evans Lance, MD;  Location: [facility] CATH LAB;  Service: Cardiovascular;  Laterality: N/A;  . TONSILLECTOMY      reports that  has never smoked. she has never used smokeless tobacco. She reports that  she does not drink alcohol or use drugs. family history includes Breast cancer in her sister; Colon cancer in her brother; Congestive Heart Failure (age of onset: 35) in her father; Heart attack in her mother; Heart disease in her brother; Heart disease (age of onset: 61) in her mother; Kidney disease in her mother; Uterine cancer in her sister. Allergies  Allergen Reactions  . Nitrofurantoin     Unknown. Macrobid.   . Sulfamethoxazole-Trimethoprim     unknown  . Sulfonamide Derivatives     REACTION: rash     Review of Systems  Constitutional: Positive for appetite change. Negative for chills  and fever.  Gastrointestinal: Positive for diarrhea. Negative for abdominal pain, blood in stool, nausea and vomiting.  Genitourinary: Negative for dysuria.  Hematological: Negative for adenopathy.       Objective:   Physical Exam  Constitutional: She appears well-developed and well-nourished.  HENT:  Mouth/Throat: Oropharynx is clear and moist.  Cardiovascular: Normal rate and regular rhythm.  Pulmonary/Chest: Effort normal and breath sounds normal. No respiratory distress. She has no wheezes. She has no rales.  Abdominal: Soft. She exhibits no mass. There is no tenderness. There is no rebound and no guarding.  Neurological: She is alert.       Assessment:     Persistent diarrhea. She is describing 1 to 2 stools per day. C. difficile would seem unlikely with no recent hospitalization or antibiotics and with relatively infrequent stools as above. GI pathogen panel negative. Patient does not appear dehydrated    Plan:     -Given handout on appropriate diet. She has been consuming some fatty food such as macaroni and cheese which may be exacerbating.  Keep fat and sugar intake down and focus on bland diet. -Continue Imodium as needed -Follow-up immediately for a fever or increased frequency or volume of stools -Touch base in one week if diarrhea not resolving -consider repeat TSH if symptoms persist -consider Stool C dif if diarrhea increases in severity - consider OTC probiotic.    Eulas Post MD [facility]

## 2017-02-19 NOTE — Telephone Encounter (Signed)
Spoke with pt voiced understanding that she has a follow up app with Dr Elease Hashimoto this evening, Pt stated that she will be here then.

## 2017-02-24 ENCOUNTER — Ambulatory Visit: Payer: Self-pay

## 2017-02-24 NOTE — Telephone Encounter (Signed)
Phone call from the pt.  Wanted to give update to Dr. Elease Hashimoto.  Reported she  had small amts. of diarrhea stools on Thurs. and Friday, and no stools on Saturday or Sunday.  Today, stated she had a small one this morning, and a large explosive stool this afternoon.  Reported she is very tired of this.  She is requesting further testing to try to determine the cause of the diarrhea.  Denied fever/ chills, nausea or vomiting, or abdominal pain.  Reported she is drinking water, Pedialyte,  and hot tea.  Denied weakness, dizziness, or dry mouth.  Does think her urine output is decreased, compared to normal.  Care advice given per protocol.  Advised will send message to Dr. Elease Hashimoto, per her request.        Reason for Disposition . [1] MILD diarrhea (e.g., 1-3 or more stools than normal in past 24 hours) without known cause AND [2] present >  7 days  Answer Assessment - Initial Assessment Questions 1. DIARRHEA SEVERITY: "How bad is the diarrhea?" "How many extra stools have you had in the past 24 hours than normal?"    - MILD: Few loose or mushy BMs; increase of 1-3 stools over normal daily number of stools; mild increase in ostomy output.   - MODERATE: Increase of 4-6 stools daily over normal; moderate increase in ostomy output.   - SEVERE (or Worst Possible): Increase of 7 or more stools daily over normal; moderate increase in ostomy output; incontinence.     unsure 2. ONSET: "When did the diarrhea begin?"      today 3. BM CONSISTENCY: "How loose or watery is the diarrhea?"      Yellow, thick loose stool 4. VOMITING: "Are you also vomiting?" If so, ask: "How many times in the past 24 hours?"      no 5. ABDOMINAL PAIN: "Are you having any abdominal pain?" If yes: "What does it feel like?" (e.g., crampy, dull, intermittent, constant)      no  6. ABDOMINAL PAIN SEVERITY: If present, ask: "How bad is the pain?"  (e.g., Scale 1-10; mild, moderate, or severe)    - MILD (1-3): doesn't interfere with  normal activities, abdomen soft and not tender to touch     - MODERATE (4-7): interferes with normal activities or awakens from sleep, tender to touch     - SEVERE (8-10): excruciating pain, doubled over, unable to do any normal activities       N/a 7. ORAL INTAKE: If vomiting, "Have you been able to drink liquids?" "How much fluids have you had in the past 24 hours?"     Drinking hot tea, water, and Pedialyte 8. HYDRATION: "Any signs of dehydration?" (e.g., dry mouth [not just dry lips], too weak to stand, dizziness, new weight loss) "When did you last urinate?"     Some decrease in urination 9. EXPOSURE: "Have you traveled to a foreign country recently?" "Have you been exposed to anyone with diarrhea?" "Could you have eaten any food that was spoiled?"     no 10. OTHER SYMPTOMS: "Do you have any other symptoms?" (e.g., fever, blood in stool)       No fever / chills; denied any blood in stool 11. PREGNANCY: "Is there any chance you are pregnant?" "When was your last menstrual period?"       No  Protocols used: DIARRHEA-A-AH

## 2017-02-25 NOTE — Telephone Encounter (Signed)
I recommend GI referral if diarrhea persists- though it sounds like it has improved somewhat.  Her GI pathogen panel was negative.  No recent antibiotic use.

## 2017-02-26 NOTE — Telephone Encounter (Signed)
Spoke with pt and advised of referral option. She states that she does feel she is improving some. She would like to wait and see how she does over the next few days. She will call back if she would like to proceed with referral. Nothing further needed at this time.

## 2017-02-28 ENCOUNTER — Telehealth: Payer: Self-pay | Admitting: Family Medicine

## 2017-02-28 DIAGNOSIS — R197 Diarrhea, unspecified: Secondary | ICD-10-CM

## 2017-02-28 DIAGNOSIS — K219 Gastro-esophageal reflux disease without esophagitis: Secondary | ICD-10-CM

## 2017-02-28 NOTE — Telephone Encounter (Signed)
Patient calling stating its urgent she gets in, please advise

## 2017-02-28 NOTE — Telephone Encounter (Signed)
Copied from [place] [id number]. Topic: Quick Communication - See Telephone Encounter >> Feb 28, 2017  9:02 AM Boyd Kerbs wrote: CRM for notification. See Telephone encounter for:   Patient is asking for a referral to Endocrinologist, Dr. Elease Hashimoto said she may need to go,   wants to see if they can get her in soon,  been having diarrhea since Christmas.     02/28/17.

## 2017-02-28 NOTE — Telephone Encounter (Signed)
Referral placed.

## 2017-02-28 NOTE — Telephone Encounter (Signed)
She needs to see GI, not endocrinology.  OK to refer to GI

## 2017-02-28 NOTE — Telephone Encounter (Signed)
Okay to refer? 

## 2017-03-06 ENCOUNTER — Ambulatory Visit: Payer: Medicare Other | Admitting: Gastroenterology

## 2017-03-07 ENCOUNTER — Ambulatory Visit (INDEPENDENT_AMBULATORY_CARE_PROVIDER_SITE_OTHER): Payer: Medicare Other | Admitting: Gastroenterology

## 2017-03-07 ENCOUNTER — Encounter: Payer: Self-pay | Admitting: Gastroenterology

## 2017-03-07 VITALS — BP 128/60 | HR 60 | Ht 66.0 in | Wt 119.4 lb

## 2017-03-07 DIAGNOSIS — R197 Diarrhea, unspecified: Secondary | ICD-10-CM

## 2017-03-07 DIAGNOSIS — R634 Abnormal weight loss: Secondary | ICD-10-CM | POA: Diagnosis not present

## 2017-03-07 NOTE — Progress Notes (Signed)
Thank you for sending this case to me. I have reviewed the entire note, and the outlined plan seems appropriate.  Acute onset diarrhea most likely to be infectious, even after several weeks.  Seems likely the diarrhea will recur after stopping imodium, so C diff PCR important in that case. If negative, manage conservatively with lower dose imodium so she will have less fear of diarrhea and may begin to eat better and avoid further weight loss. Please talk to me before you plan any potential colonoscopy for this patient, as her age significantly increases risk of procedure. Also, check to be sure she has had a TSH since this started.  Wilfrid Lund, MD

## 2017-03-07 NOTE — Progress Notes (Signed)
03/07/2017 [name] [id number] [birth year]   HISTORY OF PRESENT ILLNESS: This is a 84 year old female who was known to Dr. Olevia Perches.  Her care will be assumed by Dr. Loletha Carrow.  She presents here today with her son for evaluation of acute diarrheal illness.  She says that this began about 2 days after Christmas and has been persistent.  She reports having several stools per day, usually every time she would eat something.  It started out as watery diarrhea.  Only had one episode of nocturnal stools.  She denies any rectal bleeding or abdominal pain.  She has lost 8 pounds over the past couple of weeks, but has only been eating chicken noodle soup, cream of wheat, cooked carrots, baked potatoes since she is trying to eat a bland diet and is not eating much at all.  She reports 2 episodes of stool incontinence.  She went to the emergency department where a CBC showed a mildly low hemoglobin at 10.4 g, lipase was normal, CMP fairly unremarkable.  GI pathogen panel was performed and was negative, but unfortunately C. difficile is no longer included in that panel so was not checked.  She has been taking a lot of Imodium, several doses per day.  Now she has not had any bowel movement for the past 2 days.  Her last colonoscopy was in March 2011 at which time 2 polyps were removed and she also had diverticulosis.  The polyps were tubular adenomas on pathology.  Never had any issues with chronic diarrhea in the past.   Past Medical History:  Diagnosis Date  . ANEMIA, NORMOCYTIC 11/30/2009  . CAD (coronary artery disease) 04/27/2008   Non-Obstructive >> Cardiac catheterization 6/08: EF 55%, 1+ MR, Dx 50%, LAD 50%, dLAD 40%, OM1 50-60% (up to 70%), pOM 30%, pRCA 40%, mRCA 50-60%, pPDA 50-60% (up to 70%).  Medical therapy was recommended // MV 11/14: Normal stress nuclear study. LV Ejection Fraction: 67%.   . Carotid artery disease ([place])    Left CEA in 10/12.  Carotid dopplers (10/15) with 1-39% bilateral ICA  stenosis. // Carotid US 10/17: R < 40; patent L CEA site  . COLONIC POLYPS, ADENOMATOUS 12/26/2009  . DIABETES MELLITUS, TYPE II 08/11/2006  . DIVERTICULOSIS, COLON 08/11/2006  . GERD 02/16/2007  . Hemorrhoids    s/p banding  . History of echocardiogram    Echo 5/18: EF 60-65, no RWMA, Gr 2 DD, mild-mod MR, mod LAE // Echo 2/17: Mild LVH, mild focal basal septal hypertrophy, EF 55-60%, no RWMA, Gr 1 DD, MAC, mild MR, mild  // Echo 4/14: EF 60-65%, normal wall motion, Gr 1 DD, MAC, mild MR, PASP 32, reduced excursion of AV noncoronary cusp.    Marland Kitchen HYPERLIPIDEMIA 09/09/2006  . HYPERTENSION 09/09/2006  . LEFT BUNDLE BRANCH BLOCK 08/11/2006  . OSTEOARTHRITIS 08/11/2006  . OSTEOPOROSIS 08/11/2006  . PAF (paroxysmal atrial fibrillation) ([place]) 04/12/2009   Paroxysmal, only prior documented episode was years ago.  She was not started on anticoagulation back then due to hemorrhoidal bleeding, apparently profuse.  Event monitor (3/14) showed only NSR.    Marland Kitchen Stroke ([place]) 12-06-13  . Syncope    8/15. EEG (5/15) was unremarkable. // 4/18: ILR neg for arrhythmia - ? orthostatic (no prodrome reported) >> c/b SAH (small)   . TIA 09/02/2006   Echocardiogram 6/12: Mild to moderate MR, trace AI, trace TR, PASP 34, bubble study negative for intracardiac shunting, normal EF (greater than 55%) //  Possible TIA  in 10/15 with visual field cut.  MRI (10/15) with no CVA. Linq monitor placed.   Past Surgical History:  Procedure Laterality Date  . CAROTID ENDARTERECTOMY Left 12-05-2010  . CLOSED REDUCTION METATARSAL FRACTURE     right 5th  . COLONOSCOPY W/ POLYPECTOMY  2001  . EYE SURGERY     catarac  . LOOP RECORDER IMPLANT N/A 01/03/2014   Procedure: LOOP RECORDER IMPLANT;  Surgeon: Evans Lance, MD;  Location: [facility] CATH LAB;  Service: Cardiovascular;  Laterality: N/A;  . TONSILLECTOMY      reports that  has never smoked. she has never used smokeless tobacco. She reports that she does not drink alcohol or use  drugs. family history includes Breast cancer in her sister; Colon cancer in her brother; Congestive Heart Failure (age of onset: 32) in her father; Heart attack in her mother; Heart disease in her brother; Heart disease (age of onset: 13) in her mother; Kidney disease in her mother; Uterine cancer in her sister. Allergies  Allergen Reactions  . Nitrofurantoin     Unknown. Macrobid.   . Sulfamethoxazole-Trimethoprim     unknown  . Sulfonamide Derivatives     REACTION: rash      Outpatient Encounter Medications as of 03/07/2017  Medication Sig  . acetaminophen (TYLENOL) 325 MG tablet Take 2 tablets (650 mg total) by mouth every 4 (four) hours as needed for mild pain (or temp > 37.5 C (99.5 F)).  . Ascorbic Acid (VITAMIN C PO) Take 1 tablet by mouth daily.   Marland Kitchen aspirin EC 81 MG tablet Take 1 tablet (81 mg total) by mouth daily.  Marland Kitchen atorvastatin (LIPITOR) 20 MG tablet Take 1 tablet (20 mg total) by mouth daily at 6 PM.  . calcium carbonate 200 MG capsule Take 200 mg by mouth daily.   . Cholecalciferol (VITAMIN D PO) Take 1 tablet by mouth daily.   . Cyanocobalamin (VITAMIN B 12 PO) Take 1 tablet by mouth daily.   Marland Kitchen docusate sodium (COLACE) 100 MG capsule Take 1 capsule (100 mg total) by mouth 2 (two) times daily.  . fish oil-omega-3 fatty acids 1000 MG capsule Take 1 capsule by mouth daily.   Marland Kitchen gabapentin (NEURONTIN) 100 MG capsule Take 100 mg by mouth as needed for pain. Toe pain  . glucose blood test strip Use once daily  . metoprolol tartrate (LOPRESSOR) 25 MG tablet TAKE ONE-HALF (1/2) TABLET TWICE A DAY  . Multiple Vitamins-Minerals (MULTIVITAMIN WITH MINERALS) tablet Take 1 tablet by mouth daily.  . ONE TOUCH LANCETS MISC Use daily as directed  . pantoprazole (PROTONIX) 20 MG tablet TAKE 1 TABLET EVERY OTHER DAY  . VITAMIN E PO Take 1 tablet by mouth daily.   . [DISCONTINUED] zolpidem (AMBIEN) 10 MG tablet TAKE 1 TABLET BY MOUTH DAILY AT BEDTIME AS NEEDED (Patient not taking: Reported  on 03/07/2017)   No facility-administered encounter medications on file as of 03/07/2017.      REVIEW OF SYSTEMS  : All other systems reviewed and negative except where noted in the History of Present Illness.   PHYSICAL EXAM: BP 128/60   Pulse 60   Ht 5' 6"  (1.676 m)   Wt 119 lb 6.4 oz (54.2 kg)   BMI 19.27 kg/m  General: Well developed white female in no acute distress Head: Normocephalic and atraumatic Eyes:  Sclerae anicteric, conjunctiva pink. Ears: Normal auditory acuity Lungs: Clear throughout to auscultation; no increased WOB. Heart: Regular rate and rhythm; no M/R/G. Abdomen: Soft, non-distended.  BS present.  Non-tender. Musculoskeletal: Symmetrical with no gross deformities  Skin: No lesions on visible extremities Extremities: No edema  Neurological: Alert oriented x 4, grossly non-focal Psychological:  Alert and cooperative. Normal mood and affect  ASSESSMENT AND PLAN: *Acute onset diarrhea in a 84 year old female.  Has lost 8 pounds as well but has barely been eating due to fear of diarrhea.  Stool GI pathogen panel was negative but Cdiff was not included.  At this point she has used a large amount of Imodium and has not moved her bowels in 2 days.  No rectal bleeding or abdominal pain.  I have asked her to discontinue the Imodium for now.  If diarrhea returns then will return stool study for Cdiff testing.  If negative for Cdiff and diarrhea continues then could consider a course of empiric flagyl.  If all of then fails and symptoms ongoing then may need to consider colonoscopy.  Will begin taking florastor BID for now as well.   CC:  Eulas Post, MD

## 2017-03-07 NOTE — Patient Instructions (Addendum)
If you are age 82 or older, your body mass index should be between 23-30. Your Body mass index is 19.27 kg/m. If this is out of the aforementioned range listed, please consider follow up with your Primary Care Provider.  If you are age 30 or younger, your body mass index should be between 19-25. Your Body mass index is 19.27 kg/m. If this is out of the aformentioned range listed, please consider follow up with your Primary Care Provider.   Your physician has requested that you go to the basement for the following lab work before leaving today: C Diff PCR  DISCONTINUE Imodium for now.  Start Florastor (over-the-counter) Take twice daily.  Thank you for choosing me and [facility].   Alonza Bogus, PA-C

## 2017-03-10 ENCOUNTER — Other Ambulatory Visit: Payer: Medicare Other

## 2017-03-10 DIAGNOSIS — R197 Diarrhea, unspecified: Secondary | ICD-10-CM

## 2017-03-11 LAB — CLOSTRIDIUM DIFFICILE BY PCR: Toxigenic C. Difficile by PCR: NEGATIVE

## 2017-03-13 ENCOUNTER — Telehealth: Payer: Self-pay | Admitting: Nurse Practitioner

## 2017-03-13 DIAGNOSIS — R197 Diarrhea, unspecified: Secondary | ICD-10-CM

## 2017-03-13 MED ORDER — METRONIDAZOLE 500 MG PO TABS: 500.0000 mg | ORAL_TABLET | Freq: Two times a day (BID) | ORAL | 0 refills | Status: AC

## 2017-03-13 NOTE — Telephone Encounter (Signed)
-----   Message from Loralie Champagne, PA-C sent at 03/13/2017  1:16 PM EST -----   ----- Message ----- From: Jeoffrey Massed, RN Sent: 03/13/2017  11:40 AM To: Loralie Champagne, PA-C  Jess this lab was put in under [facility] by mistake.  Can you review and give recommendations?

## 2017-03-13 NOTE — Telephone Encounter (Signed)
Patient states she is still having diarrhea and is asking that antibiotics be sent to [facility].

## 2017-03-13 NOTE — Telephone Encounter (Signed)
The pt has been advised and says she is no longer having diarrhea.  She will call if the diarrhea returns.  She will have labs this week

## 2017-03-13 NOTE — Telephone Encounter (Signed)
Jess please advise of results.

## 2017-03-13 NOTE — Addendum Note (Signed)
Addended by: Jeoffrey Massed on: 03/13/2017 03:07 PM   Modules accepted: Orders

## 2017-03-13 NOTE — Telephone Encounter (Signed)
Alonza Bogus, PA saw this patient.

## 2017-03-13 NOTE — Telephone Encounter (Signed)
The abx has been sent and the pt advised to start florastor after completing the course.

## 2017-03-13 NOTE — Telephone Encounter (Signed)
Cdiff is negative.  I discussed with Dr. Loletha Carrow and he thinks that it sounds infectious as well and agrees that symptoms can sometimes last for several weeks.  If she is still having diarrhea then let's treat her with a 7 day course of flagyl to see if that helps at all.  flagyl 500 mg BID x 7 days.  Then restart florastor when that is completed.  Also, please have her come in to get thyroid labs, TSH, checked as well.  Thank you,  Jess

## 2017-03-17 ENCOUNTER — Other Ambulatory Visit (INDEPENDENT_AMBULATORY_CARE_PROVIDER_SITE_OTHER): Payer: Medicare Other

## 2017-03-17 ENCOUNTER — Telehealth: Payer: Self-pay | Admitting: Gastroenterology

## 2017-03-17 DIAGNOSIS — R197 Diarrhea, unspecified: Secondary | ICD-10-CM

## 2017-03-17 LAB — TSH: TSH: 4.6 u[IU]/mL — AB (ref 0.35–4.50)

## 2017-03-17 NOTE — Telephone Encounter (Signed)
The pt has been advised to come in for lab work and start low dose imodium for now.  She agrees and will come in for labs this week.

## 2017-03-25 ENCOUNTER — Ambulatory Visit: Payer: Medicare Other | Admitting: Physician Assistant

## 2017-03-25 NOTE — Progress Notes (Deleted)
Cardiology Office Note:    Date:  03/25/2017   ID:  [name], DOB 04-06-1920, MRN [id number]  PCP:  Eulas Post, MD  Cardiologist:  Mertie Moores, MD / [name], PA-C  Electrophysiologist:  Dr. Cristopher Peru  Pulmonologist:  Dr. Caralee Ates Vascular surgeon: Dr. Donnetta Hutching   Referring MD: Eulas Post, MD   No chief complaint on file. ***  History of Present Illness:    [name] is a 84 y.o. female with a hx of nonobstructive coronary artery disease, prior transient ischemic attack, carotid artery disease status post left carotid endarterectomy October 2012, paroxysmal atrial flutter, left bundle branch block, hypertension, hyperlipidemia, diabetes, interstitial lung disease.  She has not been felt to be a candidate for Coumadin in the past secondary to profound hemorrhoidal bleeding.  Nuclear stress test in 2014 was negative for ischemia or scar.  She was admitted for syncope in August 2015.  She then had 2 episodes concerning for transient ischemic attack.  She underwent implantation of a Linq ILR.  She was admitted in April 2018 for unexplained syncope resulting in a subarachnoid hemorrhage.  No arrhythmia was noted on ILR.  She was admitted again in July 2018 after mechanical fall results of multiple nasal fractures, lacerations and left periorbital hematoma.  She was last seen in August 2018.  [name] ***  Prior CV studies:   The following studies were reviewed today:  Carotid US 10/18 <40 R ICA; patent L CEA with <40  Echo 07/04/16 EF 60-65, normal wall motion, grade 2 diastolic dysfunction, calcified aortic valve, mild to moderate MR, moderate LAE  Carotid US 10/17 R < 40; patent L CEA site  Echo 03/16/15 Mild LVH, mild focal basal septal hypertrophy, EF 55-60%, no RWMA, Gr 1 DD, MAC, mild MR, mild LAE  Carotid US 10/16 RICA < 40% L CEA patent  Adenosine Myoview (12/23/12): Normal stress nuclear study. LV Ejection Fraction: 67%.    Echo 05/2012: EF 60-65%, normal wall motion, Gr 1 DD, MAC, mild MR, PASP 32, reduced excursion of AV noncoronary cusp.  Carotid US 5/14:  patent L CEA, RICA <40%.Event Monitor 3/14:NSR.  LHC 6/08:  EF 55%, 1+ MR,  Dx 50%, LAD 50%, dLAD 40% OM1 50-60% (up to 70%), pOM 30% pRCA 40%, mRCA 50-60%, pPDA 50-60% (up to 70%).  Medical Rx was recommended.    Past Medical History:  Diagnosis Date  . ANEMIA, NORMOCYTIC 11/30/2009  . CAD (coronary artery disease) 04/27/2008   Non-Obstructive >> Cardiac catheterization 6/08: EF 55%, 1+ MR, Dx 50%, LAD 50%, dLAD 40%, OM1 50-60% (up to 70%), pOM 30%, pRCA 40%, mRCA 50-60%, pPDA 50-60% (up to 70%).  Medical therapy was recommended // MV 11/14: Normal stress nuclear study. LV Ejection Fraction: 67%.   . Carotid artery disease ([place])    Left CEA in 10/12.  Carotid dopplers (10/15) with 1-39% bilateral ICA stenosis. // Carotid US 10/17: R < 40; patent L CEA site  . COLONIC POLYPS, ADENOMATOUS 12/26/2009  . DIABETES MELLITUS, TYPE II 08/11/2006  . DIVERTICULOSIS, COLON 08/11/2006  . GERD 02/16/2007  . Hemorrhoids    s/p banding  . History of echocardiogram    Echo 5/18: EF 60-65, no RWMA, Gr 2 DD, mild-mod MR, mod LAE // Echo 2/17: Mild LVH, mild focal basal septal hypertrophy, EF 55-60%, no RWMA, Gr 1 DD, MAC, mild MR, mild  // Echo 4/14: EF 60-65%, normal wall motion, Gr 1 DD, MAC, mild MR,  PASP 32, reduced excursion of AV noncoronary cusp.    Marland Kitchen HYPERLIPIDEMIA 09/09/2006  . HYPERTENSION 09/09/2006  . LEFT BUNDLE BRANCH BLOCK 08/11/2006  . OSTEOARTHRITIS 08/11/2006  . OSTEOPOROSIS 08/11/2006  . PAF (paroxysmal atrial fibrillation) ([place]) 04/12/2009   Paroxysmal, only prior documented episode was years ago.  She was not started on anticoagulation back then due to hemorrhoidal bleeding, apparently profuse.  Event monitor (3/14) showed only NSR.    Marland Kitchen Stroke ([place]) 12-06-13  . Syncope    8/15. EEG (5/15) was unremarkable. // 4/18: ILR neg for  arrhythmia - ? orthostatic (no prodrome reported) >> c/b SAH (small)   . TIA 09/02/2006   Echocardiogram 6/12: Mild to moderate MR, trace AI, trace TR, PASP 34, bubble study negative for intracardiac shunting, normal EF (greater than 55%) //  Possible TIA in 10/15 with visual field cut.  MRI (10/15) with no CVA. Linq monitor placed.    Past Surgical History:  Procedure Laterality Date  . CAROTID ENDARTERECTOMY Left 12-05-2010  . CLOSED REDUCTION METATARSAL FRACTURE     right 5th  . COLONOSCOPY W/ POLYPECTOMY  2001  . EYE SURGERY     catarac  . LOOP RECORDER IMPLANT N/A 01/03/2014   Procedure: LOOP RECORDER IMPLANT;  Surgeon: Evans Lance, MD;  Location: [facility] CATH LAB;  Service: Cardiovascular;  Laterality: N/A;  . TONSILLECTOMY      Current Medications: No outpatient medications have been marked as taking for the 03/25/17 encounter (Appointment) with [name], PA-C.     Allergies:   Nitrofurantoin; Sulfamethoxazole-trimethoprim; and Sulfonamide derivatives   Social History   Tobacco Use  . Smoking status: Never Smoker  . Smokeless tobacco: Never Used  Substance Use Topics  . Alcohol use: No    Alcohol/week: 0.0 oz  . Drug use: No     Family Hx: The patient's family history includes Breast cancer in her sister; Colon cancer in her brother; Congestive Heart Failure (age of onset: 46) in her father; Heart attack in her mother; Heart disease in her brother; Heart disease (age of onset: 36) in her mother; Kidney disease in her mother; Uterine cancer in her sister.  ROS:   Please see the history of present illness.    ROS All other systems reviewed and are negative.   EKGs/Labs/Other Test Reviewed:    EKG:  EKG is *** ordered today.  The ekg ordered today demonstrates ***  Recent Labs: 06/03/2016: Magnesium 1.8 09/06/2016: B Natriuretic Peptide 364.9 02/14/2017: ALT 14; BUN 21; Creatinine, Ser 1.08; Hemoglobin 10.4; Platelets 227; Potassium 4.1; Sodium 137 03/17/2017: TSH  4.60   Recent Lipid Panel Lab Results  Component Value Date/Time   CHOL 126 05/22/2016 12:23 PM   TRIG 128.0 05/22/2016 12:23 PM   TRIG 80 01/13/2006 10:10 AM   HDL 47.40 05/22/2016 12:23 PM   CHOLHDL 3 05/22/2016 12:23 PM   [place] 53 05/22/2016 12:23 PM    Physical Exam:    VS:  There were no vitals taken for this visit.    Wt Readings from Last 3 Encounters:  03/07/17 119 lb 6.4 oz (54.2 kg)  02/19/17 125 lb 3.2 oz (56.8 kg)  02/14/17 119 lb (54 kg)     ***Physical Exam  ASSESSMENT & PLAN:    Coronary artery disease involving native coronary artery of native heart without angina pectoris  PAF (paroxysmal atrial fibrillation) (HCC)  Carotid artery disease, unspecified laterality, unspecified type (HCC)  Essential hypertension  Syncope, unspecified syncope type*** 1. Coronary artery  disease involving native coronary artery of native heart without angina pectoris Moderate nonobstructive CAD by cardiac catheterization in 2008.  Nuclear stress test in 2014 was low risk. She denies angina. Continue aspirin, statin.   2. Syncope, unspecified syncope type No recurrent syncope. We again discussed driving restrictions for 6 months after her last episode of syncope.   3. Carotid artery disease, unspecified laterality ([place]) s/p L CEA.  Managed by vascular surgery. Continue aspirin, statin.   4. Essential hypertension Fair control. Given her recent history of recurrent falls, I think she should have a goal of less than 150/90.   5. PAF (paroxysmal atrial fibrillation) ([place]) To date, no apparent recurrence. Given her recurrent falls, she would likely be a poor candidate for anticoagulation therapy.   59. Fall, subsequent encounter She had multiple facial wounds. Continue follow-up with primary care.   7. ILD (interstitial lung disease) ([place])   She had low oxygen levels while in the hospital in July. She stopped using oxygen on her own. Her recent O2 sat yesterday with  primary care was 94% on room air.    Dispo:  No Follow-up on file.   Medication Adjustments/Labs and Tests Ordered: Current medicines are reviewed at length with the patient today.  Concerns regarding medicines are outlined above.  Tests Ordered: No orders of the defined types were placed in this encounter.  Medication Changes: No orders of the defined types were placed in this encounter.   Signed, [name], PA-C  03/25/2017 1:34 PM    [facility] [address], [place], [place]  [address] Phone: [phone]; Fax: [phone]

## 2017-03-31 ENCOUNTER — Ambulatory Visit: Payer: Medicare Other | Admitting: Family Medicine

## 2017-04-02 ENCOUNTER — Telehealth: Payer: Self-pay | Admitting: Gastroenterology

## 2017-04-02 NOTE — Telephone Encounter (Signed)
Left message on machine to call back  

## 2017-04-03 ENCOUNTER — Encounter: Payer: Self-pay | Admitting: Physician Assistant

## 2017-04-03 NOTE — Telephone Encounter (Signed)
Left message on machine to call back  

## 2017-04-03 NOTE — Telephone Encounter (Signed)
The pt has been advised to continue florastor for her loose stool (once daily) and imodium as needed.  She will call back if this does not help or she develops worsening diarrhea.

## 2017-04-22 ENCOUNTER — Ambulatory Visit (INDEPENDENT_AMBULATORY_CARE_PROVIDER_SITE_OTHER): Payer: Medicare Other | Admitting: Physician Assistant

## 2017-04-22 ENCOUNTER — Encounter: Payer: Self-pay | Admitting: Physician Assistant

## 2017-04-22 VITALS — BP 102/40 | HR 72 | Ht 66.0 in | Wt 118.4 lb

## 2017-04-22 DIAGNOSIS — I10 Essential (primary) hypertension: Secondary | ICD-10-CM

## 2017-04-22 DIAGNOSIS — I2510 Atherosclerotic heart disease of native coronary artery without angina pectoris: Secondary | ICD-10-CM | POA: Diagnosis not present

## 2017-04-22 DIAGNOSIS — I739 Peripheral vascular disease, unspecified: Secondary | ICD-10-CM

## 2017-04-22 DIAGNOSIS — I779 Disorder of arteries and arterioles, unspecified: Secondary | ICD-10-CM | POA: Diagnosis not present

## 2017-04-22 DIAGNOSIS — R197 Diarrhea, unspecified: Secondary | ICD-10-CM

## 2017-04-22 NOTE — Patient Instructions (Signed)
Medication Instructions:  Your physician recommends that you continue on your current medications as directed. Please refer to the Current Medication list given to you today.   Labwork: NONE ORDERED TODAY  Testing/Procedures: NONE ORDERED TODAY   Follow-Up: Your physician wants you to follow-up in: [place], [facility]  You will receive a reminder letter in the mail two months in advance. If you don't receive a letter, please call our office to schedule the follow-up appointment.   Any Other Special Instructions Will Be Listed Below (If Applicable).     If you need a refill on your cardiac medications before your next appointment, please call your pharmacy.

## 2017-04-22 NOTE — Progress Notes (Signed)
Cardiology Office Note:    Date:  04/22/2017   ID:  [name], DOB Mar 22, 1920, MRN [id number]  PCP:  Eulas Post, MD  Cardiologist:  Mertie Moores, MD / [name], PA-C  Electrophysiologist:  Dr. Cristopher Peru  Pulmonologist:  Dr. Caralee Ates Vascular surgeon: Dr. Donnetta Hutching  GI:  Dr. Loletha Carrow  Referring MD: Eulas Post, MD   Chief Complaint  Patient presents with  . Follow-up    CAD    History of Present Illness:    [name] is a 84 y.o. female with a hx of nonobstructive coronary artery disease, prior transient ischemic attack, carotid artery disease status post left carotid endarterectomy October 2012, paroxysmal atrial flutter, left bundle branch block, hypertension, hyperlipidemia, diabetes, interstitial lung disease.  She has not been felt to be a candidate for Coumadin in the past secondary to profound hemorrhoidal bleeding.  Nuclear stress test in 2014 was negative for ischemia or scar.  She was admitted for syncope in August 2015.  She then had 2 episodes concerning for transient ischemic attack.  She underwent implantation of a Linq ILR.  She was admitted in April 2018 for unexplained syncope resulting in a subarachnoid hemorrhage.  No arrhythmia was noted on ILR.  She was admitted again in July 2018 after mechanical fall results of multiple nasal fractures, lacerations and left periorbital hematoma.  She was last seen in August 2018.  [name] returns for follow up.  She is here alone.  She has been struggling with diarrhea since Dec 2018.  CDiff test was neg.  She denies any chest pain, shortness of breath, syncope, paroxysmal nocturnal dyspnea, edema.    Prior CV studies:   The following studies were reviewed today:  Carotid US 10/18 <40 R ICA; patent L CEA with <40  Echo 07/04/16 EF 60-65, normal wall motion, grade 2 diastolic dysfunction, calcified aortic valve, mild to moderate MR, moderate LAE  Carotid US 10/17 R < 40; patent L CEA  site  Echo 03/16/15 Mild LVH, mild focal basal septal hypertrophy, EF 55-60%, no RWMA, Gr 1 DD, MAC, mild MR, mild LAE  Carotid US 10/16 RICA < 40% L CEA patent  Adenosine Myoview (12/23/12): Normal stress nuclear study. LV Ejection Fraction: 67%.   Echo 05/2012: EF 60-65%, normal wall motion, Gr 1 DD, MAC, mild MR, PASP 32, reduced excursion of AV noncoronary cusp.  Carotid US 5/14:  patent L CEA, RICA <40%.Event Monitor 3/14:NSR.  LHC 6/08:  EF 55%, 1+ MR,  Dx 50%, LAD 50%, dLAD 40% OM1 50-60% (up to 70%), pOM 30% pRCA 40%, mRCA 50-60%, pPDA 50-60% (up to 70%).  Medical Rx was recommended.    Past Medical History:  Diagnosis Date  . ANEMIA, NORMOCYTIC 11/30/2009  . CAD (coronary artery disease) 04/27/2008   Non-Obstructive >> Cardiac catheterization 6/08: EF 55%, 1+ MR, Dx 50%, LAD 50%, dLAD 40%, OM1 50-60% (up to 70%), pOM 30%, pRCA 40%, mRCA 50-60%, pPDA 50-60% (up to 70%).  Medical therapy was recommended // MV 11/14: Normal stress nuclear study. LV Ejection Fraction: 67%.   . Carotid artery disease ([place])    Left CEA in 10/12.  Carotid dopplers (10/15) with 1-39% bilateral ICA stenosis. // Carotid US 10/17: R < 40; patent L CEA site  . COLONIC POLYPS, ADENOMATOUS 12/26/2009  . DIABETES MELLITUS, TYPE II 08/11/2006  . DIVERTICULOSIS, COLON 08/11/2006  . GERD 02/16/2007  . Hemorrhoids    s/p banding  . History of echocardiogram  Echo 5/18: EF 60-65, no RWMA, Gr 2 DD, mild-mod MR, mod LAE // Echo 2/17: Mild LVH, mild focal basal septal hypertrophy, EF 55-60%, no RWMA, Gr 1 DD, MAC, mild MR, mild  // Echo 4/14: EF 60-65%, normal wall motion, Gr 1 DD, MAC, mild MR, PASP 32, reduced excursion of AV noncoronary cusp.    Marland Kitchen HYPERLIPIDEMIA 09/09/2006  . HYPERTENSION 09/09/2006  . LEFT BUNDLE BRANCH BLOCK 08/11/2006  . OSTEOARTHRITIS 08/11/2006  . OSTEOPOROSIS 08/11/2006  . PAF (paroxysmal atrial fibrillation) ([place]) 04/12/2009   Paroxysmal, only prior documented episode  was years ago.  She was not started on anticoagulation back then due to hemorrhoidal bleeding, apparently profuse.  Event monitor (3/14) showed only NSR.    Marland Kitchen Stroke ([place]) 12-06-13  . Syncope    8/15. EEG (5/15) was unremarkable. // 4/18: ILR neg for arrhythmia - ? orthostatic (no prodrome reported) >> c/b SAH (small)   . TIA 09/02/2006   Echocardiogram 6/12: Mild to moderate MR, trace AI, trace TR, PASP 34, bubble study negative for intracardiac shunting, normal EF (greater than 55%) //  Possible TIA in 10/15 with visual field cut.  MRI (10/15) with no CVA. Linq monitor placed.    Past Surgical History:  Procedure Laterality Date  . CAROTID ENDARTERECTOMY Left 12-05-2010  . CLOSED REDUCTION METATARSAL FRACTURE     right 5th  . COLONOSCOPY W/ POLYPECTOMY  2001  . EYE SURGERY     catarac  . LOOP RECORDER IMPLANT N/A 01/03/2014   Procedure: LOOP RECORDER IMPLANT;  Surgeon: Evans Lance, MD;  Location[facility];  Service: Cardiovascular;  Laterality: N/A;  . TONSILLECTOMY      Current Medications: Current Meds  Medication Sig  . acetaminophen (TYLENOL) 325 MG tablet Take 2 tablets (650 mg total) by mouth every 4 (four) hours as needed for mild pain (or temp > 37.5 C (99.5 F)).  . Ascorbic Acid (VITAMIN C PO) Take 1 tablet by mouth daily.   Marland Kitchen aspirin EC 81 MG tablet Take 1 tablet (81 mg total) by mouth daily.  Marland Kitchen atorvastatin (LIPITOR) 20 MG tablet Take 1 tablet (20 mg total) by mouth daily at 6 PM.  . calcium carbonate 200 MG capsule Take 200 mg by mouth daily.   . Cholecalciferol (VITAMIN D PO) Take 1 tablet by mouth daily.   . Cyanocobalamin (VITAMIN B 12 PO) Take 1 tablet by mouth daily.   Marland Kitchen docusate sodium (COLACE) 100 MG capsule Take 1 capsule (100 mg total) by mouth 2 (two) times daily.  . fish oil-omega-3 fatty acids 1000 MG capsule Take 1 capsule by mouth daily.   Marland Kitchen gabapentin (NEURONTIN) 100 MG capsule Take 100 mg by mouth as needed for pain. Toe pain  . glucose blood test  strip Use once daily  . metoprolol tartrate (LOPRESSOR) 25 MG tablet TAKE ONE-HALF (1/2) TABLET TWICE A DAY  . Multiple Vitamins-Minerals (MULTIVITAMIN WITH MINERALS) tablet Take 1 tablet by mouth daily.  . ONE TOUCH LANCETS MISC Use daily as directed  . pantoprazole (PROTONIX) 20 MG tablet TAKE 1 TABLET EVERY OTHER DAY  . VITAMIN E PO Take 1 tablet by mouth daily.      Allergies:   Sulfamethoxazole-trimethoprim; Sulfonamide derivatives; and Nitrofurantoin   Social History   Tobacco Use  . Smoking status: Never Smoker  . Smokeless tobacco: Never Used  Substance Use Topics  . Alcohol use: No    Alcohol/week: 0.0 oz  . Drug use: No  Family Hx: The patient's family history includes Breast cancer in her sister; Colon cancer in her brother; Congestive Heart Failure (age of onset: 40) in her father; Heart attack in her mother; Heart disease in her brother; Heart disease (age of onset: 6) in her mother; Kidney disease in her mother; Uterine cancer in her sister.  ROS:   Please see the history of present illness.    ROS All other systems reviewed and are negative.   EKGs/Labs/Other Test Reviewed:    EKG:  EKG is  ordered today.  The ekg ordered today demonstrates normal sinus rhythm, heart rate 72, left bundle branch block  Recent Labs: 06/03/2016: Magnesium 1.8 09/06/2016: B Natriuretic Peptide 364.9 02/14/2017: ALT 14; BUN 21; Creatinine, Ser 1.08; Hemoglobin 10.4; Platelets 227; Potassium 4.1; Sodium 137 03/17/2017: TSH 4.60   Recent Lipid Panel Lab Results  Component Value Date/Time   CHOL 126 05/22/2016 12:23 PM   TRIG 128.0 05/22/2016 12:23 PM   TRIG 80 01/13/2006 10:10 AM   HDL 47.40 05/22/2016 12:23 PM   CHOLHDL 3 05/22/2016 12:23 PM   [place] 53 05/22/2016 12:23 PM    Physical Exam:    VS:  BP (!) 102/40   Pulse 72   Ht _0  (1.676 m)   Wt 118 lb 6.4 oz (53.7 kg)   SpO2 96%   BMI 19.11 kg/m     Wt Readings from Last 3 Encounters:  04/22/17 118 lb 6.4 oz  (53.7 kg)  03/07/17 119 lb 6.4 oz (54.2 kg)  02/19/17 125 lb 3.2 oz (56.8 kg)     Physical Exam  Constitutional: She is oriented to person, place, and time. She appears well-developed and well-nourished. No distress.  HENT:  Head: Normocephalic and atraumatic.  Neck: No JVD present.  Cardiovascular: Normal rate and regular rhythm.  No murmur heard. Pulmonary/Chest: Effort normal. She has no rales.  Abdominal: Soft.  Musculoskeletal: She exhibits no edema.  Neurological: She is alert and oriented to person, place, and time.  Skin: Skin is warm and dry.    ASSESSMENT & PLAN:    #1.  Coronary artery disease History of moderate nonobstructive coronary artery disease by cardiac catheterization in 2008.  Nuclear stress test in 2014 was low risk.  She has not had any symptoms consistent with angina.  Continue aspirin, statin.  #2.  Carotid artery disease, unspecified laterality, unspecified type ([place]) History of left CEA.  Continue follow-up with vascular surgery.  #3.  Essential hypertension The patient's blood pressure is controlled on her current regimen.  Continue current therapy.   #4.  Diarrhea, unspecified type I have encouraged her to continue to hydrate well and to follow-up with gastroenterology.   Dispo:  Return in about 6 months (around 10/23/2017) for Routine Follow Up, w/ [name], PA-C.   Medication Adjustments/Labs and Tests Ordered: Current medicines are reviewed at length with the patient today.  Concerns regarding medicines are outlined above.  Tests Ordered: Orders Placed This Encounter  Procedures  . EKG 12-Lead   Medication Changes: No orders of the defined types were placed in this encounter.   Signed, [name], PA-C  04/22/2017 2:34 PM    [address], [place], [place]  [address] Phone: [phone]; Fax: [phone]

## 2017-05-19 LAB — HM DIABETES EYE EXAM

## 2017-05-23 ENCOUNTER — Encounter: Payer: Self-pay | Admitting: Family Medicine

## 2017-05-27 ENCOUNTER — Telehealth: Payer: Self-pay | Admitting: Family Medicine

## 2017-05-27 NOTE — Telephone Encounter (Signed)
I recommend follow up to discuss.  At her age, even low doses if not taken appropriately could lead to increased side effects.

## 2017-05-27 NOTE — Telephone Encounter (Signed)
Appointment made

## 2017-05-27 NOTE — Telephone Encounter (Signed)
gabapentin (NEURONTIN) 100 MG capsule ° °

## 2017-05-27 NOTE — Telephone Encounter (Signed)
Copied from [place]. Topic: General - Other >> May 27, 2017  3:01 PM Carolyn Stare wrote:  Pt said she has not had this med in a while and is asking if it can be filled. Pt said call her when med is called into the pharmacy     [place]

## 2017-06-04 ENCOUNTER — Ambulatory Visit: Payer: Medicare Other | Admitting: Family Medicine

## 2017-06-04 ENCOUNTER — Encounter: Payer: Self-pay | Admitting: Family Medicine

## 2017-06-04 VITALS — BP 120/56 | HR 64 | Temp 97.5°F | Ht 66.0 in | Wt 114.9 lb

## 2017-06-04 DIAGNOSIS — E118 Type 2 diabetes mellitus with unspecified complications: Secondary | ICD-10-CM | POA: Diagnosis not present

## 2017-06-04 DIAGNOSIS — R634 Abnormal weight loss: Secondary | ICD-10-CM

## 2017-06-04 DIAGNOSIS — R531 Weakness: Secondary | ICD-10-CM | POA: Diagnosis not present

## 2017-06-04 DIAGNOSIS — E785 Hyperlipidemia, unspecified: Secondary | ICD-10-CM | POA: Diagnosis not present

## 2017-06-04 DIAGNOSIS — I10 Essential (primary) hypertension: Secondary | ICD-10-CM | POA: Diagnosis not present

## 2017-06-04 DIAGNOSIS — E46 Unspecified protein-calorie malnutrition: Secondary | ICD-10-CM

## 2017-06-04 LAB — HEPATIC FUNCTION PANEL
ALBUMIN: 3.7 g/dL (ref 3.5–5.2)
ALT: 16 U/L (ref 0–35)
AST: 26 U/L (ref 0–37)
Alkaline Phosphatase: 47 U/L (ref 39–117)
BILIRUBIN TOTAL: 0.5 mg/dL (ref 0.2–1.2)
Bilirubin, Direct: 0.1 mg/dL (ref 0.0–0.3)
Total Protein: 7.5 g/dL (ref 6.0–8.3)

## 2017-06-04 LAB — LIPID PANEL
CHOLESTEROL: 106 mg/dL (ref 0–200)
HDL: 44.2 mg/dL (ref >39.00)
LDL Cholesterol: 40 mg/dL (ref 0–99)
NonHDL: 61.85
Total CHOL/HDL Ratio: 2
Triglycerides: 109 mg/dL (ref 0.0–149.0)
VLDL: 21.8 mg/dL (ref 0.0–40.0)

## 2017-06-04 LAB — BASIC METABOLIC PANEL
BUN: 27 mg/dL — ABNORMAL HIGH (ref 6–23)
CO2: 29 mEq/L (ref 19–32)
Calcium: 9.1 mg/dL (ref 8.4–10.5)
Chloride: 103 mEq/L (ref 96–112)
Creatinine, Ser: 0.95 mg/dL (ref 0.40–1.20)
GFR: 58.09 mL/min — AB (ref >60.00)
GLUCOSE: 130 mg/dL — AB (ref 70–99)
POTASSIUM: 4.3 meq/L (ref 3.5–5.1)
SODIUM: 138 meq/L (ref 135–145)

## 2017-06-04 LAB — CBC WITH DIFFERENTIAL/PLATELET
Basophils Absolute: 0 10*3/uL (ref 0.0–0.1)
Basophils Relative: 0.5 % (ref 0.0–3.0)
EOS PCT: 3.1 % (ref 0.0–5.0)
Eosinophils Absolute: 0.3 10*3/uL (ref 0.0–0.7)
HCT: 32.1 % — ABNORMAL LOW (ref 36.0–46.0)
HEMOGLOBIN: 10.6 g/dL — AB (ref 12.0–15.0)
Lymphocytes Relative: 29.2 % (ref 12.0–46.0)
Lymphs Abs: 2.7 10*3/uL (ref 0.7–4.0)
MCHC: 33.1 g/dL (ref 30.0–36.0)
MCV: 92.7 fl (ref 78.0–100.0)
MONO ABS: 0.6 10*3/uL (ref 0.1–1.0)
Monocytes Relative: 6.7 % (ref 3.0–12.0)
Neutro Abs: 5.6 10*3/uL (ref 1.4–7.7)
Neutrophils Relative %: 60.5 % (ref 43.0–77.0)
Platelets: 258 10*3/uL (ref 150.0–400.0)
RBC: 3.46 Mil/uL — AB (ref 3.87–5.11)
RDW: 14.9 % (ref 11.5–15.5)
WBC: 9.2 10*3/uL (ref 4.0–10.5)

## 2017-06-04 LAB — HEMOGLOBIN A1C: HEMOGLOBIN A1C: 7 % — AB (ref 4.6–6.5)

## 2017-06-04 LAB — TSH: TSH: 3.36 u[IU]/mL (ref 0.35–4.50)

## 2017-06-04 LAB — T4, FREE: FREE T4: 0.72 ng/dL (ref 0.60–1.60)

## 2017-06-04 MED ORDER — MIRTAZAPINE 7.5 MG PO TABS: 7.5000 mg | ORAL_TABLET | Freq: Every day | ORAL | 5 refills | Status: DC

## 2017-06-04 NOTE — Patient Instructions (Signed)
Start the Mirtazepine 7.5 mg one at night  We will set up home PT  Let's follow up in one month

## 2017-06-04 NOTE — Progress Notes (Signed)
Subjective:     Patient ID: [name], female   DOB: May 29, 1920, 84 y.o.   MRN: [id number]  HPI Patient seen for medical follow-up. She had almost 3 month bout with diarrhea over the winter which has finally resolved. Her C. difficile and stool pathogen panels were negative. She has unfortunately had some further weight loss. No diarrhea at this time. No bloody stools. Sounds like her appetite is fair. She is eating regular meals. She had recent TSH per GI which was slightly elevated. Needs follow-up.  She's had some recent right-sided abdominal pain somewhat poorly localized- somewhat upper and lower. No nausea or vomiting. No fevers or chills. Increasing generalized weakness with her weight loss.  She's having some difficulties with sleep at night which has been more chronic.  Other medical problems include history of type 2 diabetes, CAD, hyperlipidemia, type 2 diabetes.  No falls since last visit  Past Medical History:  Diagnosis Date  . ANEMIA, NORMOCYTIC 11/30/2009  . CAD (coronary artery disease) 04/27/2008   Non-Obstructive >> Cardiac catheterization 6/08: EF 55%, 1+ MR, Dx 50%, LAD 50%, dLAD 40%, OM1 50-60% (up to 70%), pOM 30%, pRCA 40%, mRCA 50-60%, pPDA 50-60% (up to 70%).  Medical therapy was recommended // MV 11/14: Normal stress nuclear study. LV Ejection Fraction: 67%.   . Carotid artery disease ([place])    Left CEA in 10/12.  Carotid dopplers (10/15) with 1-39% bilateral ICA stenosis. // Carotid US 10/17: R < 40; patent L CEA site  . COLONIC POLYPS, ADENOMATOUS 12/26/2009  . DIABETES MELLITUS, TYPE II 08/11/2006  . DIVERTICULOSIS, COLON 08/11/2006  . GERD 02/16/2007  . Hemorrhoids    s/p banding  . History of echocardiogram    Echo 5/18: EF 60-65, no RWMA, Gr 2 DD, mild-mod MR, mod LAE // Echo 2/17: Mild LVH, mild focal basal septal hypertrophy, EF 55-60%, no RWMA, Gr 1 DD, MAC, mild MR, mild  // Echo 4/14: EF 60-65%, normal wall motion, Gr 1 DD, MAC, mild MR, PASP 32, reduced  excursion of AV noncoronary cusp.    Marland Kitchen HYPERLIPIDEMIA 09/09/2006  . HYPERTENSION 09/09/2006  . LEFT BUNDLE BRANCH BLOCK 08/11/2006  . OSTEOARTHRITIS 08/11/2006  . OSTEOPOROSIS 08/11/2006  . PAF (paroxysmal atrial fibrillation) ([place]) 04/12/2009   Paroxysmal, only prior documented episode was years ago.  She was not started on anticoagulation back then due to hemorrhoidal bleeding, apparently profuse.  Event monitor (3/14) showed only NSR.    Marland Kitchen Stroke ([place]) 12-06-13  . Syncope    8/15. EEG (5/15) was unremarkable. // 4/18: ILR neg for arrhythmia - ? orthostatic (no prodrome reported) >> c/b SAH (small)   . TIA 09/02/2006   Echocardiogram 6/12: Mild to moderate MR, trace AI, trace TR, PASP 34, bubble study negative for intracardiac shunting, normal EF (greater than 55%) //  Possible TIA in 10/15 with visual field cut.  MRI (10/15) with no CVA. Linq monitor placed.   Past Surgical History:  Procedure Laterality Date  . CAROTID ENDARTERECTOMY Left 12-05-2010  . CLOSED REDUCTION METATARSAL FRACTURE     right 5th  . COLONOSCOPY W/ POLYPECTOMY  2001  . EYE SURGERY     catarac  . LOOP RECORDER IMPLANT N/A 01/03/2014   Procedure: LOOP RECORDER IMPLANT;  Surgeon: Evans Lance, MD;  Location: [facility] CATH LAB;  Service: Cardiovascular;  Laterality: N/A;  . TONSILLECTOMY      reports that she has never smoked. She has never used smokeless tobacco. She reports that she does  not drink alcohol or use drugs. family history includes Breast cancer in her sister; Colon cancer in her brother; Congestive Heart Failure (age of onset: 22) in her father; Heart attack in her mother; Heart disease in her brother; Heart disease (age of onset: 66) in her mother; Kidney disease in her mother; Uterine cancer in her sister. Allergies  Allergen Reactions  . Sulfamethoxazole-Trimethoprim     unknown  . Sulfonamide Derivatives     REACTION: rash  . Nitrofurantoin     Unknown. Macrobid.      Review of Systems   Constitutional: Positive for appetite change and unexpected weight change. Negative for chills and fever.  Respiratory: Negative for cough and shortness of breath.   Cardiovascular: Negative for chest pain.  Gastrointestinal: Positive for abdominal pain. Negative for blood in stool, diarrhea, nausea and vomiting.  Genitourinary: Negative for dysuria.  Musculoskeletal: Negative for back pain.       Objective:   Physical Exam  Constitutional: She is oriented to person, place, and time. She appears well-developed and well-nourished.  Cardiovascular: Normal rate and regular rhythm.  Pulmonary/Chest: Effort normal and breath sounds normal. No respiratory distress. She has no wheezes. She has no rales.  Abdominal: Soft. Bowel sounds are normal. She exhibits no distension. There is no rebound.  Minimally tender right upper quadrant and mid quadrant. No masses. No hepatomegaly  Musculoskeletal: She exhibits no edema.  Neurological: She is alert and oriented to person, place, and time. No cranial nerve deficit.  Skin: No rash noted.  Psychiatric: She has a normal mood and affect. Her behavior is normal.       Assessment:     #1 recent chronic diarrhea which is finally resolved. Etiology was unclear  #2 persistent weight loss. Etiology unclear but given her age obviously at increased risk of malignancy.  #3 history of type 2 diabetes   #4 recent mild elevated TSH  #5 hyperlipidemia  #6 failure to thrive/protein calorie malnutrition.       Plan:     -Check further labs with TSH, free T4, lipid, hepatic, basic metabolic panel, A4T -Recommend trial of Remeron 7.5 mg daily at bedtime which hopefully can help with her sleep, depressed mood, and weight gain -Set up home physical therapy to help with some strengthening and reducing fall risk -Office follow-up in one month to reassess -Touch base if abdominal symptoms not improving over the next week. Consider CT abdomen pelvis for any  persistent or progressive abdominal pain or weight loss  Eulas Post MD [facility] at [facility]

## 2017-06-16 ENCOUNTER — Other Ambulatory Visit: Payer: Self-pay

## 2017-06-20 ENCOUNTER — Telehealth: Payer: Self-pay | Admitting: Family Medicine

## 2017-06-20 NOTE — Telephone Encounter (Signed)
Copied from [place]. Topic: Quick Communication - See Telephone Encounter >> Jun 20, 2017  2:34 PM Synthia Innocent wrote: CRM for notification. See Telephone encounter for: 06/20/17. Requesting Verbal orders for Pt 1x week every other week and 2x a week for 3 weeks

## 2017-06-20 NOTE — Telephone Encounter (Signed)
OK 

## 2017-06-23 NOTE — Telephone Encounter (Signed)
Verbal orders given to [facility]

## 2017-06-30 ENCOUNTER — Ambulatory Visit: Payer: Medicare Other | Admitting: Family Medicine

## 2017-07-06 ENCOUNTER — Other Ambulatory Visit: Payer: Self-pay | Admitting: Physician Assistant

## 2017-07-09 ENCOUNTER — Encounter: Payer: Self-pay | Admitting: Family Medicine

## 2017-07-09 ENCOUNTER — Ambulatory Visit (INDEPENDENT_AMBULATORY_CARE_PROVIDER_SITE_OTHER): Payer: Medicare Other | Admitting: Family Medicine

## 2017-07-09 VITALS — BP 120/56 | HR 73 | Temp 97.1°F | Wt 111.3 lb

## 2017-07-09 DIAGNOSIS — E46 Unspecified protein-calorie malnutrition: Secondary | ICD-10-CM

## 2017-07-09 DIAGNOSIS — I2510 Atherosclerotic heart disease of native coronary artery without angina pectoris: Secondary | ICD-10-CM | POA: Diagnosis not present

## 2017-07-09 DIAGNOSIS — I10 Essential (primary) hypertension: Secondary | ICD-10-CM

## 2017-07-09 DIAGNOSIS — J849 Interstitial pulmonary disease, unspecified: Secondary | ICD-10-CM | POA: Diagnosis not present

## 2017-07-09 DIAGNOSIS — I5032 Chronic diastolic (congestive) heart failure: Secondary | ICD-10-CM

## 2017-07-09 DIAGNOSIS — E118 Type 2 diabetes mellitus with unspecified complications: Secondary | ICD-10-CM

## 2017-07-09 MED ORDER — MIRTAZAPINE 7.5 MG PO TABS: 7.5000 mg | ORAL_TABLET | Freq: Every day | ORAL | 3 refills | Status: DC

## 2017-07-09 NOTE — Patient Instructions (Signed)
Consider GLUCERNA nutrition supplement.

## 2017-07-09 NOTE — Progress Notes (Signed)
Subjective:     Patient ID: [name], female   DOB: [birth year], 84 y.o.   MRN: [id number]  HPI Patient has chronic problems including hypertension, CAD history, history of paroxysmal atrial fibrillation, history of peripheral vascular disease, chronic diastolic heart failure, interstitial lung disease, type 2 diabetes, osteoporosis, hyperlipidemia, protein calorie malnutrition with recent weight loss. She is seen today basically requesting forms be completed from [place] division of motor vehicles for medical information.  She has type 2 diabetes which is basically managed with lifestyle and diet modification. Last A1c was 7.0%. No symptoms of hyperglycemia such as polyuria or polydipsia.  She's had previous carotid endarterectomy on the left side. No recent TIA-type symptoms. No recent chest pains. Compliant with medications.  Apparently her children with had concerns about her driving though she denies ever having any kind of infractions or accidents. She states that she only wishes to drive short distances during the day and avoids [address]. She is desiring to continue driving for things like groceries and church.  She has continued to get home PT and feel she is benefiting from that.  Past Medical History:  Diagnosis Date  . ANEMIA, NORMOCYTIC 11/30/2009  . CAD (coronary artery disease) 04/27/2008   Non-Obstructive >> Cardiac catheterization 6/08: EF 55%, 1+ MR, Dx 50%, LAD 50%, dLAD 40%, OM1 50-60% (up to 70%), pOM 30%, pRCA 40%, mRCA 50-60%, pPDA 50-60% (up to 70%).  Medical therapy was recommended // MV 11/14: Normal stress nuclear study. LV Ejection Fraction: 67%.   . Carotid artery disease ([place])    Left CEA in 10/12.  Carotid dopplers (10/15) with 1-39% bilateral ICA stenosis. // Carotid US 10/17: R < 40; patent L CEA site  . COLONIC POLYPS, ADENOMATOUS 12/26/2009  . DIABETES MELLITUS, TYPE II 08/11/2006  . DIVERTICULOSIS, COLON 08/11/2006  . GERD 02/16/2007  .  Hemorrhoids    s/p banding  . History of echocardiogram    Echo 5/18: EF 60-65, no RWMA, Gr 2 DD, mild-mod MR, mod LAE // Echo 2/17: Mild LVH, mild focal basal septal hypertrophy, EF 55-60%, no RWMA, Gr 1 DD, MAC, mild MR, mild  // Echo 4/14: EF 60-65%, normal wall motion, Gr 1 DD, MAC, mild MR, PASP 32, reduced excursion of AV noncoronary cusp.    Marland Kitchen HYPERLIPIDEMIA 09/09/2006  . HYPERTENSION 09/09/2006  . LEFT BUNDLE BRANCH BLOCK 08/11/2006  . OSTEOARTHRITIS 08/11/2006  . OSTEOPOROSIS 08/11/2006  . PAF (paroxysmal atrial fibrillation) ([place]) 04/12/2009   Paroxysmal, only prior documented episode was years ago.  She was not started on anticoagulation back then due to hemorrhoidal bleeding, apparently profuse.  Event monitor (3/14) showed only NSR.    Marland Kitchen Stroke ([place]) 12-06-13  . Syncope    8/15. EEG (5/15) was unremarkable. // 4/18: ILR neg for arrhythmia - ? orthostatic (no prodrome reported) >> c/b SAH (small)   . TIA 09/02/2006   Echocardiogram 6/12: Mild to moderate MR, trace AI, trace TR, PASP 34, bubble study negative for intracardiac shunting, normal EF (greater than 55%) //  Possible TIA in 10/15 with visual field cut.  MRI (10/15) with no CVA. Linq monitor placed.   Past Surgical History:  Procedure Laterality Date  . CAROTID ENDARTERECTOMY Left 12-05-2010  . CLOSED REDUCTION METATARSAL FRACTURE     right 5th  . COLONOSCOPY W/ POLYPECTOMY  2001  . EYE SURGERY     catarac  . LOOP RECORDER IMPLANT N/A 01/03/2014   Procedure: LOOP RECORDER IMPLANT;  Surgeon: Evans Lance, MD;  [facility];  Service: Cardiovascular;  Laterality: N/A;  . TONSILLECTOMY      reports that she has never smoked. She has never used smokeless tobacco. She reports that she does not drink alcohol or use drugs. family history includes Breast cancer in her sister; Colon cancer in her brother; Congestive Heart Failure (age of onset: 44) in her father; Heart attack in her mother; Heart disease in her brother;  Heart disease (age of onset: 7) in her mother; Kidney disease in her mother; Uterine cancer in her sister. Allergies  Allergen Reactions  . Sulfamethoxazole-Trimethoprim     unknown  . Sulfonamide Derivatives     REACTION: rash  . Nitrofurantoin     Unknown. Macrobid.      Review of Systems  Constitutional: Negative for appetite change, chills and fever.  Respiratory: Negative for shortness of breath.   Cardiovascular: Negative for chest pain, palpitations and leg swelling.  Gastrointestinal: Negative for abdominal pain.  Endocrine: Negative for polydipsia and polyuria.  Genitourinary: Negative for dysuria.  Neurological: Negative for dizziness, seizures and syncope.       Objective:   Physical Exam  Constitutional: She is oriented to person, place, and time. She appears well-developed and well-nourished.  Neck: Neck supple.  Cardiovascular: Normal rate and regular rhythm.  Pulmonary/Chest: Effort normal and breath sounds normal. She has no wheezes.  Musculoskeletal: She exhibits no edema.  Lymphadenopathy:    She has no cervical adenopathy.  Neurological: She is alert and oriented to person, place, and time. No cranial nerve deficit.       Assessment:     #1 history of type 2 diabetes well controlled and not currently on any diabetes medications  #2 history of CAD and peripheral vascular disease currently stable  #3 history of protein calorie malnutrition  #4 history of chronic insomnia stable on low-dose mirtazapine  #5 past history of TIA and prior left carotid endarterectomy    Plan:     -Forms completed for [employer].  She will need to get eye exam section completed by her ophthalmologist. -We suggested supplement such as Glucerna for her overall calorie intake and made some dietary suggestions as well -routine follow up in 3 months as sooner as needed.    Eulas Post MD [facility] at [facility]

## 2017-07-10 ENCOUNTER — Telehealth: Payer: Self-pay | Admitting: Family Medicine

## 2017-07-10 NOTE — Telephone Encounter (Signed)
Let her son know I am out of office today and will call tomorrow.  Send this note back to me as reminder.

## 2017-07-10 NOTE — Telephone Encounter (Signed)
Copied from [place] [id number]. Topic: Inquiry >> Jul 10, 2017  7:39 AM Scherrie Gerlach wrote: Reason for CRM: son [name] calling to talk with Dr Elease Hashimoto concerning the pt continuing to drive. Pt has a medical evaluation form that she was to provide Dr Elease Hashimoto with yesterday. Son wants to speak with Dr Elease Hashimoto prior to that. [name] feels as though pt should not be driving. Son would like a call back.  He is on the [facility]. [phone]

## 2017-07-10 NOTE — Telephone Encounter (Signed)
Son is aware.

## 2017-07-11 NOTE — Telephone Encounter (Signed)
Called and left message with son.  He did not answer.

## 2017-07-11 NOTE — Telephone Encounter (Signed)
Patient walked in with a form and it was placed on Dr [employer].

## 2017-07-11 NOTE — Telephone Encounter (Signed)
[name], son called back,  Michela Pitcher was up dating phone and did not get the message but saw phone number.   Asking if Dr. Elease Hashimoto could please call him back .  [phone]

## 2017-07-11 NOTE — Telephone Encounter (Signed)
Received.  I will take care of it.

## 2017-07-24 ENCOUNTER — Telehealth: Payer: Self-pay | Admitting: Family Medicine

## 2017-07-24 NOTE — Telephone Encounter (Signed)
Copied from [place] [id number]. Topic: Quick Communication - See Telephone Encounter >> Jul 24, 2017 12:06 PM Ivar Drape wrote: CRM for notification. See Telephone encounter for: 07/24/17. Pramod w/[facility] [phone] would like a verbal order for PT 1w1 and 2w3.

## 2017-07-25 ENCOUNTER — Telehealth: Payer: Self-pay | Admitting: *Deleted

## 2017-07-25 NOTE — Telephone Encounter (Signed)
Spoke with patient and phone number to [employer] given.  She states that mirtazapine "is working too well.  It puts her to sleep right away, but then she does not want to get out of bed." Can she take less?

## 2017-07-25 NOTE — Telephone Encounter (Signed)
Verbal orders given to [facility]

## 2017-07-25 NOTE — Telephone Encounter (Signed)
She is on the lowest dose of Mirtazepine.

## 2017-07-25 NOTE — Telephone Encounter (Signed)
ok 

## 2017-07-25 NOTE — Telephone Encounter (Signed)
Copied from [place] [id number]. 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 Pt is requesting a call back to discuss. Cb# [phone]

## 2017-07-28 NOTE — Telephone Encounter (Signed)
Patient is aware 

## 2017-08-13 ENCOUNTER — Ambulatory Visit: Payer: Medicare Other | Admitting: Family Medicine

## 2017-08-13 DIAGNOSIS — R5383 Other fatigue: Secondary | ICD-10-CM | POA: Diagnosis not present

## 2017-08-13 DIAGNOSIS — I959 Hypotension, unspecified: Secondary | ICD-10-CM | POA: Diagnosis not present

## 2017-08-13 DIAGNOSIS — R627 Adult failure to thrive: Secondary | ICD-10-CM

## 2017-08-13 DIAGNOSIS — D649 Anemia, unspecified: Secondary | ICD-10-CM

## 2017-08-13 DIAGNOSIS — M79676 Pain in unspecified toe(s): Secondary | ICD-10-CM | POA: Diagnosis not present

## 2017-08-13 MED ORDER — GABAPENTIN 100 MG PO CAPS: 100.0000 mg | ORAL_CAPSULE | Freq: Every day | ORAL | 1 refills | Status: DC

## 2017-08-13 NOTE — Progress Notes (Signed)
Subjective:     Patient ID: [name], female   DOB: 1920/07/18, 84 y.o.   MRN: [id number]  HPI Patient is seen for multiple issues as follows  She's had gradual decline over the past few years. We did set up some home physical therapy in hopes that they could help her with some strengthening and improving her stamina. They were apparently out yesterday and her blood pressure was 98/50. She's had decreased energy but denies any specific dizziness. No positional orthostatic type symptoms. Recently had to give up driving.  Complains of some toe pain. We suspected neuropathic. She has frequent sharp severe pains at night which are interfering with sleep. She has benefited in the past with low-dose gabapentin 100 mg. She is requesting refill. She is aware of potential side effects.  Saturday she states she was at the microwave fixing something to eat when she had a transient loss of vision right eye which lasted seconds. She states that she was very temporarily confused about what she was doing for just a few seconds. No loss of consciousness. She's had no recurrent symptoms since then. History of previous carotid endarterectomy.  He's had some chronic low-grade dyspnea which is essentially unchanged. She had echocardiogram last May with normal ejection fraction.  Adult failure to thrive. We have tried her on some Remeron but she has not been taking this recently. She denies depression. Weight has stabilized. She is taking some type of nutritional liquid supplement  Past Medical History:  Diagnosis Date  . ANEMIA, NORMOCYTIC 11/30/2009  . CAD (coronary artery disease) 04/27/2008   Non-Obstructive >> Cardiac catheterization 6/08: EF 55%, 1+ MR, Dx 50%, LAD 50%, dLAD 40%, OM1 50-60% (up to 70%), pOM 30%, pRCA 40%, mRCA 50-60%, pPDA 50-60% (up to 70%).  Medical therapy was recommended // MV 11/14: Normal stress nuclear study. LV Ejection Fraction: 67%.   . Carotid artery disease ([place])    Left  CEA in 10/12.  Carotid dopplers (10/15) with 1-39% bilateral ICA stenosis. // Carotid US 10/17: R < 40; patent L CEA site  . COLONIC POLYPS, ADENOMATOUS 12/26/2009  . DIABETES MELLITUS, TYPE II 08/11/2006  . DIVERTICULOSIS, COLON 08/11/2006  . GERD 02/16/2007  . Hemorrhoids    s/p banding  . History of echocardiogram    Echo 5/18: EF 60-65, no RWMA, Gr 2 DD, mild-mod MR, mod LAE // Echo 2/17: Mild LVH, mild focal basal septal hypertrophy, EF 55-60%, no RWMA, Gr 1 DD, MAC, mild MR, mild  // Echo 4/14: EF 60-65%, normal wall motion, Gr 1 DD, MAC, mild MR, PASP 32, reduced excursion of AV noncoronary cusp.    Marland Kitchen HYPERLIPIDEMIA 09/09/2006  . HYPERTENSION 09/09/2006  . LEFT BUNDLE BRANCH BLOCK 08/11/2006  . OSTEOARTHRITIS 08/11/2006  . OSTEOPOROSIS 08/11/2006  . PAF (paroxysmal atrial fibrillation) ([place]) 04/12/2009   Paroxysmal, only prior documented episode was years ago.  She was not started on anticoagulation back then due to hemorrhoidal bleeding, apparently profuse.  Event monitor (3/14) showed only NSR.    Marland Kitchen Stroke ([place]) 12-06-13  . Syncope    8/15. EEG (5/15) was unremarkable. // 4/18: ILR neg for arrhythmia - ? orthostatic (no prodrome reported) >> c/b SAH (small)   . TIA 09/02/2006   Echocardiogram 6/12: Mild to moderate MR, trace AI, trace TR, PASP 34, bubble study negative for intracardiac shunting, normal EF (greater than 55%) //  Possible TIA in 10/15 with visual field cut.  MRI (10/15) with no CVA. Linq monitor placed.  Past Surgical History:  Procedure Laterality Date  . CAROTID ENDARTERECTOMY Left 12-05-2010  . CLOSED REDUCTION METATARSAL FRACTURE     right 5th  . COLONOSCOPY W/ POLYPECTOMY  2001  . EYE SURGERY     catarac  . LOOP RECORDER IMPLANT N/A 01/03/2014   Procedure: LOOP RECORDER IMPLANT;  Surgeon: Evans Lance, MD;  Location: [facility] CATH LAB;  Service: Cardiovascular;  Laterality: N/A;  . TONSILLECTOMY      reports that she has never smoked. She has never used smokeless  tobacco. She reports that she does not drink alcohol or use drugs. family history includes Breast cancer in her sister; Colon cancer in her brother; Congestive Heart Failure (age of onset: 61) in her father; Heart attack in her mother; Heart disease in her brother; Heart disease (age of onset: 49) in her mother; Kidney disease in her mother; Uterine cancer in her sister. Allergies  Allergen Reactions  . Sulfamethoxazole-Trimethoprim     unknown  . Sulfonamide Derivatives     REACTION: rash  . Nitrofurantoin     Unknown. Macrobid.      Review of Systems  Constitutional: Positive for fatigue. Negative for chills and fever.  Respiratory: Negative for cough and shortness of breath.   Cardiovascular: Negative for chest pain, palpitations and leg swelling.  Gastrointestinal: Negative for abdominal pain, nausea and vomiting.  Genitourinary: Negative for dysuria.  Neurological: Positive for weakness. Negative for syncope and headaches.  Hematological: Negative for adenopathy.       Objective:   Physical Exam  Constitutional: She is oriented to person, place, and time.  Alert and cooperative elderly female in no distress  HENT:  Right Ear: External ear normal.  Left Ear: External ear normal.  Mouth/Throat: Oropharynx is clear and moist.  Neck: Neck supple.  Cardiovascular: Normal rate and regular rhythm.  Pulmonary/Chest: Effort normal and breath sounds normal.  Musculoskeletal: She exhibits no edema.  Lymphadenopathy:    She has no cervical adenopathy.  Neurological: She is alert and oriented to person, place, and time. No cranial nerve deficit.       Assessment:     #1 recent reported hypotension. Blood pressure today seated 120/50 and standing 115/50  #2 adult failure to thrive. She's had fairly substantial weight loss in recent years along with gradual decline in overall health.  #3 high-risk of falls  #4 probable neuropathic toe pain  #5 history of chronic normocytic  anemia    Plan:     -Stressed importance of adequate hydration -Recheck basic metabolic panel and CBC -Use cane or other assistance for ambulation at all times -Refill low-dose gabapentin 100 mg daily at bedtime to use when necessary- for toe pain.   -Discussed the fact that she may need more assistance for care in the near future. She is very reluctant to the idea of going to any sort of assisted living arrangement at this time  Eulas Post MD [facility]

## 2017-08-13 NOTE — Patient Instructions (Signed)
Increase hydration  Make take gabapentin at night for severe pain- but only one at night  Change positions slowly.

## 2017-08-14 LAB — CBC WITH DIFFERENTIAL/PLATELET
BASOS ABS: 44 {cells}/uL (ref 0–200)
Basophils Relative: 0.4 %
EOS PCT: 1.3 %
Eosinophils Absolute: 142 cells/uL (ref 15–500)
HCT: 24.2 % — ABNORMAL LOW (ref 35.0–45.0)
Hemoglobin: 7.5 g/dL — ABNORMAL LOW (ref 11.7–15.5)
LYMPHS ABS: 2376 {cells}/uL (ref 850–3900)
MCH: 27.1 pg (ref 27.0–33.0)
MCHC: 31 g/dL — AB (ref 32.0–36.0)
MCV: 87.4 fL (ref 80.0–100.0)
MONOS PCT: 7.8 %
MPV: 9.3 fL (ref 7.5–12.5)
Neutro Abs: 7488 cells/uL (ref 1500–7800)
Neutrophils Relative %: 68.7 %
PLATELETS: 311 10*3/uL (ref 140–400)
RBC: 2.77 10*6/uL — AB (ref 3.80–5.10)
RDW: 13.7 % (ref 11.0–15.0)
TOTAL LYMPHOCYTE: 21.8 %
WBC mixed population: 850 cells/uL (ref 200–950)
WBC: 10.9 10*3/uL — AB (ref 3.8–10.8)

## 2017-08-14 LAB — BASIC METABOLIC PANEL
BUN/Creatinine Ratio: 37 (calc) — ABNORMAL HIGH (ref 6–22)
BUN: 33 mg/dL — AB (ref 7–25)
CALCIUM: 8.8 mg/dL (ref 8.6–10.4)
CHLORIDE: 102 mmol/L (ref 98–110)
CO2: 27 mmol/L (ref 20–32)
Creat: 0.9 mg/dL — ABNORMAL HIGH (ref 0.60–0.88)
Glucose, Bld: 157 mg/dL — ABNORMAL HIGH (ref 65–99)
POTASSIUM: 4.8 mmol/L (ref 3.5–5.3)
SODIUM: 138 mmol/L (ref 135–146)

## 2017-08-15 ENCOUNTER — Encounter: Payer: Self-pay | Admitting: Family Medicine

## 2017-08-15 ENCOUNTER — Telehealth: Payer: Self-pay | Admitting: Family Medicine

## 2017-08-15 ENCOUNTER — Ambulatory Visit: Payer: Medicare Other | Admitting: Family Medicine

## 2017-08-15 VITALS — BP 118/58 | HR 95 | Wt 110.7 lb

## 2017-08-15 DIAGNOSIS — I5032 Chronic diastolic (congestive) heart failure: Secondary | ICD-10-CM | POA: Diagnosis not present

## 2017-08-15 DIAGNOSIS — I2510 Atherosclerotic heart disease of native coronary artery without angina pectoris: Secondary | ICD-10-CM

## 2017-08-15 DIAGNOSIS — R531 Weakness: Secondary | ICD-10-CM

## 2017-08-15 DIAGNOSIS — I10 Essential (primary) hypertension: Secondary | ICD-10-CM

## 2017-08-15 DIAGNOSIS — E118 Type 2 diabetes mellitus with unspecified complications: Secondary | ICD-10-CM

## 2017-08-15 DIAGNOSIS — D649 Anemia, unspecified: Secondary | ICD-10-CM | POA: Diagnosis not present

## 2017-08-15 DIAGNOSIS — R634 Abnormal weight loss: Secondary | ICD-10-CM

## 2017-08-15 LAB — POC HEMOCCULT BLD/STL (OFFICE/1-CARD/DIAGNOSTIC): Fecal Occult Blood, POC: POSITIVE — AB

## 2017-08-15 LAB — POCT HEMOGLOBIN: Hemoglobin: 7.6 g/dL — AB (ref 12.2–16.2)

## 2017-08-15 NOTE — Patient Instructions (Signed)
Go to ER directly for further evaluation.

## 2017-08-15 NOTE — Progress Notes (Signed)
Subjective:     Patient ID: [name], female   DOB: 04/03/1920, 84 y.o.   MRN: [id number]  HPI Patient seen today for follow-up from visit 2 days ago. Refer to that note for details. She had recent hypotensive episode and has had some progressive symptoms of shortness of breath, dizziness, fatigue. We obtained some lab work which was significant for hemoglobin of 7.5. This was a decrease from 10.5 on 06/04/17.  Patient denies any recent melena or any obvious bloody stools. She has occasional right lower quadrant abdominal pain.  No epigastric pain.She's had declining weight for several months. She did not have any orthostatic changes with blood pressure 2 days ago. We had instructed her to come back today for reassessment.  She is on baby aspirin. No anticoagulation.  She's felt extremely weak at times. She states this morning she was having difficulties with basic ADLs such as preparing breakfast, dressing, etc. She lives alone.  Past Medical History:  Diagnosis Date  . ANEMIA, NORMOCYTIC 11/30/2009  . CAD (coronary artery disease) 04/27/2008   Non-Obstructive >> Cardiac catheterization 6/08: EF 55%, 1+ MR, Dx 50%, LAD 50%, dLAD 40%, OM1 50-60% (up to 70%), pOM 30%, pRCA 40%, mRCA 50-60%, pPDA 50-60% (up to 70%).  Medical therapy was recommended // MV 11/14: Normal stress nuclear study. LV Ejection Fraction: 67%.   . Carotid artery disease ([place])    Left CEA in 10/12.  Carotid dopplers (10/15) with 1-39% bilateral ICA stenosis. // Carotid US 10/17: R < 40; patent L CEA site  . COLONIC POLYPS, ADENOMATOUS 12/26/2009  . DIABETES MELLITUS, TYPE II 08/11/2006  . DIVERTICULOSIS, COLON 08/11/2006  . GERD 02/16/2007  . Hemorrhoids    s/p banding  . History of echocardiogram    Echo 5/18: EF 60-65, no RWMA, Gr 2 DD, mild-mod MR, mod LAE // Echo 2/17: Mild LVH, mild focal basal septal hypertrophy, EF 55-60%, no RWMA, Gr 1 DD, MAC, mild MR, mild  // Echo 4/14: EF 60-65%, normal wall motion, Gr 1 DD,  MAC, mild MR, PASP 32, reduced excursion of AV noncoronary cusp.    Marland Kitchen HYPERLIPIDEMIA 09/09/2006  . HYPERTENSION 09/09/2006  . LEFT BUNDLE BRANCH BLOCK 08/11/2006  . OSTEOARTHRITIS 08/11/2006  . OSTEOPOROSIS 08/11/2006  . PAF (paroxysmal atrial fibrillation) ([place]) 04/12/2009   Paroxysmal, only prior documented episode was years ago.  She was not started on anticoagulation back then due to hemorrhoidal bleeding, apparently profuse.  Event monitor (3/14) showed only NSR.    Marland Kitchen Stroke ([place]) 12-06-13  . Syncope    8/15. EEG (5/15) was unremarkable. // 4/18: ILR neg for arrhythmia - ? orthostatic (no prodrome reported) >> c/b SAH (small)   . TIA 09/02/2006   Echocardiogram 6/12: Mild to moderate MR, trace AI, trace TR, PASP 34, bubble study negative for intracardiac shunting, normal EF (greater than 55%) //  Possible TIA in 10/15 with visual field cut.  MRI (10/15) with no CVA. Linq monitor placed.   Past Surgical History:  Procedure Laterality Date  . CAROTID ENDARTERECTOMY Left 12-05-2010  . CLOSED REDUCTION METATARSAL FRACTURE     right 5th  . COLONOSCOPY W/ POLYPECTOMY  2001  . EYE SURGERY     catarac  . LOOP RECORDER IMPLANT N/A 01/03/2014   Procedure: LOOP RECORDER IMPLANT;  Surgeon: Evans Lance, MD;  Location: [facility] CATH LAB;  Service: Cardiovascular;  Laterality: N/A;  . TONSILLECTOMY      reports that she has never smoked. She has never used  smokeless tobacco. She reports that she does not drink alcohol or use drugs. family history includes Breast cancer in her sister; Colon cancer in her brother; Congestive Heart Failure (age of onset: 22) in her father; Heart attack in her mother; Heart disease in her brother; Heart disease (age of onset: 38) in her mother; Kidney disease in her mother; Uterine cancer in her sister. Allergies  Allergen Reactions  . Sulfamethoxazole-Trimethoprim     unknown  . Sulfonamide Derivatives     REACTION: rash  . Nitrofurantoin     Unknown. Macrobid.       Review of Systems  Constitutional: Positive for fatigue. Negative for chills and fever.  Respiratory: Positive for shortness of breath. Negative for cough.   Cardiovascular: Negative for chest pain.  Gastrointestinal: Positive for constipation. Negative for blood in stool, nausea and vomiting.  Genitourinary: Negative for dysuria.  Neurological: Positive for weakness and light-headedness. Negative for syncope.  Psychiatric/Behavioral: Negative for confusion.       Objective:   Physical Exam  Constitutional:  Alert thin cooperative 84 year old female  HENT:  Mouth/Throat: Oropharynx is clear and moist.  Cardiovascular: Normal rate.  Pulmonary/Chest: Effort normal.  Abdominal: Soft. Bowel sounds are normal. There is no tenderness. There is no guarding.  Genitourinary:  Genitourinary Comments: Rectal exam reveals some firm stool in rectal vault which is brown and Hemoccult positive. She has hemorrhoids noted in the anal region       Assessment:     #1 anemia with 3 g drop in hemoglobin over the past couple months. Repeat hemoglobin today by fingerstick 7.6 (c/w 7.5 2 days ago). Patient has become much more debilitated in recent weeks with increasing dizziness, shortness of breath, and extreme fatigue and weakness. Etiology unclear. ?AVM vs other.  Obviously, at her age malignancy is a potential concern.  She does not demonstrate any significant orthostatic drop today and does not have any significant tachycardia.  She does have Hemoccult positive stool.  #2 hypertension stable  #3 history of CAD  #4 history of diastolic heart failure  #5 history of controlled type 2 diabetes  #6 weight loss over several months.      Plan:     -We discussed option of possible hospitalization. With hemoglobin of 7.5-7.6 range and with increasing shortness of breath, dizziness, weakness may need transfusion -Consider CT abdomen and pelvis to further evaluate -Will need to have  discussion in detail about how aggressive to be with workup given her age and multiple comorbidities. She has been fairly independent and functional up until this past year and still lives alone - I spoke with Hospitalist at [facility] and they advised ER assessment and pt declines.  She is concerned about potential long wait in ER.  She is aware of risks of not being admitted at this time.  Even though no further drop in hgb from 2 days ago, at her age and debilitated state not much reserve.  Consider blood transfusion early week and pt knows to go to ER over weekend if worsening.  Eulas Post MD [facility]

## 2017-08-15 NOTE — Telephone Encounter (Signed)
Copied from [place] [id number]. Topic: Quick Communication - See Telephone Encounter >> Aug 15, 2017 11:55 AM Mylinda Latina, NT wrote: CRM for notification. See Telephone encounter for: 08/15/17. Patient son [name] called and states he would like a call back about the patient blood work. He lives out of town and will not be able to come today to her appt. Please call CB# [phone]

## 2017-08-16 ENCOUNTER — Emergency Department (HOSPITAL_COMMUNITY): Payer: Medicare Other

## 2017-08-16 ENCOUNTER — Inpatient Hospital Stay (HOSPITAL_COMMUNITY)
Admission: EM | Admit: 2017-08-16 | Discharge: 2017-08-26 | DRG: 329 | Disposition: A | Discharge status: Skilled Nursing Facility | Payer: Medicare Other | Attending: Family Medicine | Admitting: Family Medicine

## 2017-08-16 ENCOUNTER — Other Ambulatory Visit: Payer: Self-pay

## 2017-08-16 ENCOUNTER — Encounter (HOSPITAL_COMMUNITY): Payer: Self-pay

## 2017-08-16 DIAGNOSIS — I5032 Chronic diastolic (congestive) heart failure: Secondary | ICD-10-CM | POA: Diagnosis present

## 2017-08-16 DIAGNOSIS — E43 Unspecified severe protein-calorie malnutrition: Secondary | ICD-10-CM

## 2017-08-16 DIAGNOSIS — I447 Left bundle-branch block, unspecified: Secondary | ICD-10-CM | POA: Diagnosis present

## 2017-08-16 DIAGNOSIS — I110 Hypertensive heart disease with heart failure: Secondary | ICD-10-CM | POA: Diagnosis not present

## 2017-08-16 DIAGNOSIS — I6521 Occlusion and stenosis of right carotid artery: Secondary | ICD-10-CM | POA: Diagnosis not present

## 2017-08-16 DIAGNOSIS — R058 Other specified cough: Secondary | ICD-10-CM

## 2017-08-16 DIAGNOSIS — Z9889 Other specified postprocedural states: Secondary | ICD-10-CM | POA: Diagnosis not present

## 2017-08-16 DIAGNOSIS — Z888 Allergy status to other drugs, medicaments and biological substances status: Secondary | ICD-10-CM

## 2017-08-16 DIAGNOSIS — I480 Paroxysmal atrial fibrillation: Secondary | ICD-10-CM | POA: Diagnosis not present

## 2017-08-16 DIAGNOSIS — I2510 Atherosclerotic heart disease of native coronary artery without angina pectoris: Secondary | ICD-10-CM | POA: Diagnosis present

## 2017-08-16 DIAGNOSIS — K648 Other hemorrhoids: Secondary | ICD-10-CM | POA: Diagnosis present

## 2017-08-16 DIAGNOSIS — E860 Dehydration: Secondary | ICD-10-CM | POA: Diagnosis not present

## 2017-08-16 DIAGNOSIS — Z8673 Personal history of transient ischemic attack (TIA), and cerebral infarction without residual deficits: Secondary | ICD-10-CM | POA: Diagnosis not present

## 2017-08-16 DIAGNOSIS — E118 Type 2 diabetes mellitus with unspecified complications: Secondary | ICD-10-CM | POA: Diagnosis not present

## 2017-08-16 DIAGNOSIS — Z882 Allergy status to sulfonamides status: Secondary | ICD-10-CM | POA: Diagnosis not present

## 2017-08-16 DIAGNOSIS — K5730 Diverticulosis of large intestine without perforation or abscess without bleeding: Secondary | ICD-10-CM | POA: Diagnosis present

## 2017-08-16 DIAGNOSIS — R1011 Right upper quadrant pain: Secondary | ICD-10-CM | POA: Diagnosis not present

## 2017-08-16 DIAGNOSIS — Z86010 Personal history of colonic polyps: Secondary | ICD-10-CM | POA: Diagnosis not present

## 2017-08-16 DIAGNOSIS — R634 Abnormal weight loss: Secondary | ICD-10-CM | POA: Diagnosis not present

## 2017-08-16 DIAGNOSIS — R5383 Other fatigue: Secondary | ICD-10-CM | POA: Diagnosis present

## 2017-08-16 DIAGNOSIS — R05 Cough: Secondary | ICD-10-CM

## 2017-08-16 DIAGNOSIS — K639 Disease of intestine, unspecified: Secondary | ICD-10-CM | POA: Diagnosis not present

## 2017-08-16 DIAGNOSIS — F5104 Psychophysiologic insomnia: Secondary | ICD-10-CM | POA: Diagnosis present

## 2017-08-16 DIAGNOSIS — D62 Acute posthemorrhagic anemia: Secondary | ICD-10-CM | POA: Diagnosis not present

## 2017-08-16 DIAGNOSIS — E785 Hyperlipidemia, unspecified: Secondary | ICD-10-CM | POA: Diagnosis present

## 2017-08-16 DIAGNOSIS — Z66 Do not resuscitate: Secondary | ICD-10-CM | POA: Diagnosis not present

## 2017-08-16 DIAGNOSIS — Z8249 Family history of ischemic heart disease and other diseases of the circulatory system: Secondary | ICD-10-CM

## 2017-08-16 DIAGNOSIS — R1031 Right lower quadrant pain: Secondary | ICD-10-CM | POA: Diagnosis not present

## 2017-08-16 DIAGNOSIS — I10 Essential (primary) hypertension: Secondary | ICD-10-CM | POA: Diagnosis not present

## 2017-08-16 DIAGNOSIS — M4856XA Collapsed vertebra, not elsewhere classified, lumbar region, initial encounter for fracture: Secondary | ICD-10-CM | POA: Diagnosis not present

## 2017-08-16 DIAGNOSIS — Z7189 Other specified counseling: Secondary | ICD-10-CM | POA: Diagnosis not present

## 2017-08-16 DIAGNOSIS — K6389 Other specified diseases of intestine: Secondary | ICD-10-CM

## 2017-08-16 DIAGNOSIS — Z01818 Encounter for other preprocedural examination: Secondary | ICD-10-CM

## 2017-08-16 DIAGNOSIS — C182 Malignant neoplasm of ascending colon: Principal | ICD-10-CM

## 2017-08-16 DIAGNOSIS — K219 Gastro-esophageal reflux disease without esophagitis: Secondary | ICD-10-CM | POA: Diagnosis present

## 2017-08-16 DIAGNOSIS — I779 Disorder of arteries and arterioles, unspecified: Secondary | ICD-10-CM | POA: Diagnosis present

## 2017-08-16 DIAGNOSIS — Z7902 Long term (current) use of antithrombotics/antiplatelets: Secondary | ICD-10-CM

## 2017-08-16 DIAGNOSIS — Z7982 Long term (current) use of aspirin: Secondary | ICD-10-CM

## 2017-08-16 DIAGNOSIS — D649 Anemia, unspecified: Secondary | ICD-10-CM | POA: Diagnosis not present

## 2017-08-16 DIAGNOSIS — K56609 Unspecified intestinal obstruction, unspecified as to partial versus complete obstruction: Secondary | ICD-10-CM

## 2017-08-16 DIAGNOSIS — R531 Weakness: Secondary | ICD-10-CM | POA: Diagnosis not present

## 2017-08-16 DIAGNOSIS — Z8719 Personal history of other diseases of the digestive system: Secondary | ICD-10-CM

## 2017-08-16 DIAGNOSIS — F05 Delirium due to known physiological condition: Secondary | ICD-10-CM | POA: Diagnosis not present

## 2017-08-16 DIAGNOSIS — K922 Gastrointestinal hemorrhage, unspecified: Secondary | ICD-10-CM

## 2017-08-16 DIAGNOSIS — K8070 Calculus of gallbladder and bile duct without cholecystitis without obstruction: Secondary | ICD-10-CM | POA: Diagnosis present

## 2017-08-16 DIAGNOSIS — E119 Type 2 diabetes mellitus without complications: Secondary | ICD-10-CM

## 2017-08-16 DIAGNOSIS — D500 Iron deficiency anemia secondary to blood loss (chronic): Secondary | ICD-10-CM | POA: Diagnosis not present

## 2017-08-16 DIAGNOSIS — E46 Unspecified protein-calorie malnutrition: Secondary | ICD-10-CM | POA: Diagnosis present

## 2017-08-16 DIAGNOSIS — Z803 Family history of malignant neoplasm of breast: Secondary | ICD-10-CM

## 2017-08-16 DIAGNOSIS — Z841 Family history of disorders of kidney and ureter: Secondary | ICD-10-CM

## 2017-08-16 DIAGNOSIS — Z515 Encounter for palliative care: Secondary | ICD-10-CM | POA: Diagnosis not present

## 2017-08-16 DIAGNOSIS — Z9089 Acquired absence of other organs: Secondary | ICD-10-CM

## 2017-08-16 DIAGNOSIS — Z8049 Family history of malignant neoplasm of other genital organs: Secondary | ICD-10-CM

## 2017-08-16 DIAGNOSIS — Z681 Body mass index (BMI) 19 or less, adult: Secondary | ICD-10-CM | POA: Diagnosis not present

## 2017-08-16 DIAGNOSIS — Z79899 Other long term (current) drug therapy: Secondary | ICD-10-CM

## 2017-08-16 DIAGNOSIS — J849 Interstitial pulmonary disease, unspecified: Secondary | ICD-10-CM | POA: Diagnosis present

## 2017-08-16 DIAGNOSIS — Z800 Family history of malignant neoplasm of digestive organs: Secondary | ICD-10-CM

## 2017-08-16 DIAGNOSIS — D124 Benign neoplasm of descending colon: Secondary | ICD-10-CM | POA: Diagnosis not present

## 2017-08-16 DIAGNOSIS — I739 Peripheral vascular disease, unspecified: Secondary | ICD-10-CM

## 2017-08-16 DIAGNOSIS — R933 Abnormal findings on diagnostic imaging of other parts of digestive tract: Secondary | ICD-10-CM | POA: Diagnosis not present

## 2017-08-16 HISTORY — DX: Fracture of nasal bones, initial encounter for open fracture: S02.2XXB

## 2017-08-16 HISTORY — DX: Unspecified fall, initial encounter: W19.XXXA

## 2017-08-16 LAB — CBC WITH DIFFERENTIAL/PLATELET
BASOS ABS: 0 10*3/uL (ref 0.0–0.1)
Basophils Relative: 0 %
EOS ABS: 0.2 10*3/uL (ref 0.0–0.7)
Eosinophils Relative: 2 %
HCT: 24.7 % — ABNORMAL LOW (ref 36.0–46.0)
HEMOGLOBIN: 7.6 g/dL — AB (ref 12.0–15.0)
LYMPHS ABS: 2.1 10*3/uL (ref 0.7–4.0)
LYMPHS PCT: 20 %
MCH: 27.8 pg (ref 26.0–34.0)
MCHC: 30.8 g/dL (ref 30.0–36.0)
MCV: 90.5 fL (ref 78.0–100.0)
Monocytes Absolute: 0.8 10*3/uL (ref 0.1–1.0)
Monocytes Relative: 7 %
NEUTROS PCT: 71 %
Neutro Abs: 7.8 10*3/uL — ABNORMAL HIGH (ref 1.7–7.7)
PLATELETS: 315 10*3/uL (ref 150–400)
RBC: 2.73 MIL/uL — AB (ref 3.87–5.11)
RDW: 15.3 % (ref 11.5–15.5)
WBC: 10.9 10*3/uL — AB (ref 4.0–10.5)

## 2017-08-16 LAB — COMPREHENSIVE METABOLIC PANEL
ALT: 17 U/L (ref 0–44)
AST: 25 U/L (ref 15–41)
Albumin: 2.9 g/dL — ABNORMAL LOW (ref 3.5–5.0)
Alkaline Phosphatase: 41 U/L (ref 38–126)
Anion gap: 10 (ref 5–15)
BILIRUBIN TOTAL: 0.4 mg/dL (ref 0.3–1.2)
BUN: 35 mg/dL — ABNORMAL HIGH (ref 8–23)
CHLORIDE: 102 mmol/L (ref 98–111)
CO2: 28 mmol/L (ref 22–32)
Calcium: 8.8 mg/dL — ABNORMAL LOW (ref 8.9–10.3)
Creatinine, Ser: 0.88 mg/dL (ref 0.44–1.00)
GFR, EST NON AFRICAN AMERICAN: 54 mL/min — AB (ref >60)
Glucose, Bld: 152 mg/dL — ABNORMAL HIGH (ref 70–99)
POTASSIUM: 4 mmol/L (ref 3.5–5.1)
Sodium: 140 mmol/L (ref 135–145)
TOTAL PROTEIN: 7.4 g/dL (ref 6.5–8.1)

## 2017-08-16 LAB — IRON AND TIBC
Iron: 29 ug/dL (ref 28–170)
SATURATION RATIOS: 9 % — AB (ref 10.4–31.8)
TIBC: 337 ug/dL (ref 250–450)
UIBC: 308 ug/dL

## 2017-08-16 LAB — PROTIME-INR
INR: 1.07
PROTHROMBIN TIME: 13.8 s (ref 11.4–15.2)

## 2017-08-16 LAB — VITAMIN B12: Vitamin B-12: 593 pg/mL (ref 180–914)

## 2017-08-16 LAB — GLUCOSE, CAPILLARY: GLUCOSE-CAPILLARY: 159 mg/dL — AB (ref 70–99)

## 2017-08-16 LAB — FERRITIN: FERRITIN: 19 ng/mL (ref 11–307)

## 2017-08-16 LAB — PREPARE RBC (CROSSMATCH)

## 2017-08-16 LAB — POC OCCULT BLOOD, ED: Fecal Occult Bld: POSITIVE — AB

## 2017-08-16 LAB — RETICULOCYTES
RBC.: 2.73 MIL/uL — ABNORMAL LOW (ref 3.87–5.11)
RETIC CT PCT: 2.8 % (ref 0.4–3.1)
Retic Count, Absolute: 76.4 10*3/uL (ref 19.0–186.0)

## 2017-08-16 LAB — FOLATE: Folate: 21.7 ng/mL (ref >5.9)

## 2017-08-16 LAB — SAVE SMEAR

## 2017-08-16 MED ORDER — BISACODYL 10 MG RE SUPP: 10.0000 mg | Freq: Every day | RECTAL | Status: DC | PRN

## 2017-08-16 MED ORDER — POLYETHYLENE GLYCOL 3350 17 G PO PACK: 17.0000 g | PACK | Freq: Every day | ORAL | Status: DC | PRN

## 2017-08-16 MED ORDER — DOCUSATE SODIUM 100 MG PO CAPS: 100.0000 mg | ORAL_CAPSULE | Freq: Two times a day (BID) | ORAL | Status: DC | PRN

## 2017-08-16 MED ORDER — SODIUM CHLORIDE 0.9% IV SOLUTION
Freq: Once | INTRAVENOUS | Status: AC
  Administered 2017-08-16: 17:00:00 via INTRAVENOUS

## 2017-08-16 MED ORDER — ENSURE ENLIVE PO LIQD
237.0000 mL | Freq: Two times a day (BID) | ORAL | Status: DC
  Administered 2017-08-17: 237 mL via ORAL

## 2017-08-16 MED ORDER — PANTOPRAZOLE SODIUM 20 MG PO TBEC
20.0000 mg | DELAYED_RELEASE_TABLET | ORAL | Status: DC
  Administered 2017-08-17 – 2017-08-19 (×2): 20 mg via ORAL
  Filled 2017-08-16 (×4): qty 1

## 2017-08-16 MED ORDER — ACETAMINOPHEN 325 MG PO TABS: 650.0000 mg | ORAL_TABLET | Freq: Four times a day (QID) | ORAL | Status: DC | PRN

## 2017-08-16 MED ORDER — ACETAMINOPHEN 650 MG RE SUPP: 650.0000 mg | Freq: Four times a day (QID) | RECTAL | Status: DC | PRN

## 2017-08-16 MED ORDER — INSULIN ASPART 100 UNIT/ML ~~LOC~~ SOLN
0.0000 [IU] | Freq: Three times a day (TID) | SUBCUTANEOUS | Status: DC
  Administered 2017-08-17: 1 [IU] via SUBCUTANEOUS
  Administered 2017-08-17: 5 [IU] via SUBCUTANEOUS
  Administered 2017-08-18: 3 [IU] via SUBCUTANEOUS
  Administered 2017-08-18: 1 [IU] via SUBCUTANEOUS
  Administered 2017-08-18: 2 [IU] via SUBCUTANEOUS
  Administered 2017-08-19: 3 [IU] via SUBCUTANEOUS
  Administered 2017-08-19 – 2017-08-21 (×3): 2 [IU] via SUBCUTANEOUS

## 2017-08-16 MED ORDER — FAMOTIDINE IN NACL 20-0.9 MG/50ML-% IV SOLN
20.0000 mg | Freq: Once | INTRAVENOUS | Status: AC
  Administered 2017-08-16: 20 mg via INTRAVENOUS
  Filled 2017-08-16: qty 50

## 2017-08-16 MED ORDER — IOPAMIDOL (ISOVUE-300) INJECTION 61%
100.0000 mL | Freq: Once | INTRAVENOUS | Status: AC | PRN
  Administered 2017-08-16: 100 mL via INTRAVENOUS

## 2017-08-16 MED ORDER — ONDANSETRON HCL 4 MG PO TABS: 4.0000 mg | ORAL_TABLET | Freq: Four times a day (QID) | ORAL | Status: DC | PRN

## 2017-08-16 MED ORDER — IOPAMIDOL (ISOVUE-300) INJECTION 61%
INTRAVENOUS | Status: AC
  Filled 2017-08-16: qty 100

## 2017-08-16 MED ORDER — METOPROLOL TARTRATE 25 MG PO TABS
25.0000 mg | ORAL_TABLET | Freq: Two times a day (BID) | ORAL | Status: DC
  Administered 2017-08-16 – 2017-08-21 (×10): 25 mg via ORAL
  Filled 2017-08-16 (×10): qty 1

## 2017-08-16 MED ORDER — ATORVASTATIN CALCIUM 10 MG PO TABS
20.0000 mg | ORAL_TABLET | Freq: Every day | ORAL | Status: DC
  Administered 2017-08-17 – 2017-08-20 (×4): 20 mg via ORAL
  Filled 2017-08-16 (×4): qty 1

## 2017-08-16 MED ORDER — ONDANSETRON HCL 4 MG/2ML IJ SOLN: 4.0000 mg | Freq: Four times a day (QID) | INTRAMUSCULAR | Status: DC | PRN

## 2017-08-16 NOTE — ED Triage Notes (Signed)
Pt from home via EMS c/o abnormal labs. Pt was seen at her PCP last week, results were called to her yesterday that Hgb 7.6. Pt refused to come to ED yesterday. Pt became SOB on exertion today with weakness. Pt is A&O and in NAD

## 2017-08-16 NOTE — ED Provider Notes (Signed)
[name] Provider Note   CSN: [id number] Arrival date & time: 08/16/17  1442     History   Chief Complaint Chief Complaint  Patient presents with  . Abnormal Lab    HPI [name] is a 84 y.o. female.  [name] is a 84 y.o. Female with a history of hypertension, hyperlipidemia, CAD, A. fib, diabetes, stroke, GERD, diverticulosis, who presents to the emergency department for evaluation of low hemoglobin.  Patient reports she was contacted by her primary care doctor yesterday and told her hemoglobin was 7.6, this is a 3 g drop since April of this year, patient also had Hemoccult positive stools at this time.  Patient reports she initially was avoiding coming in but reports she has been feeling increasingly weak, fatigued and having dyspnea on exertion, so presented for evaluation.  She has not noticed any dark stools or bright red blood present in her stools.  No nausea, vomiting or hematemesis.  She reports some intermittent right lower quadrant abdominal pain, that is mild in nature.  She reports feeling weak, fatigued and lightheaded, denies any syncopal episodes, no chest pain, but some dyspnea on exertion is been worsening.  No prior history of GI bleeding.  Takes daily baby aspirin, but is not on any blood thinners.  Chart review reveals patient has seen with our GI in the past for colonoscopy.     Past Medical History:  Diagnosis Date  . ANEMIA, NORMOCYTIC 11/30/2009  . CAD (coronary artery disease) 04/27/2008   Non-Obstructive >> Cardiac catheterization 6/08: EF 55%, 1+ MR, Dx 50%, LAD 50%, dLAD 40%, OM1 50-60% (up to 70%), pOM 30%, pRCA 40%, mRCA 50-60%, pPDA 50-60% (up to 70%).  Medical therapy was recommended // MV 11/14: Normal stress nuclear study. LV Ejection Fraction: 67%.   . Carotid artery disease ([place])    Left CEA in 10/12.  Carotid dopplers (10/15) with 1-39% bilateral ICA stenosis. // Carotid US 10/17: R < 40; patent L  CEA site  . COLONIC POLYPS, ADENOMATOUS 12/26/2009  . DIABETES MELLITUS, TYPE II 08/11/2006  . DIVERTICULOSIS, COLON 08/11/2006  . GERD 02/16/2007  . Hemorrhoids    s/p banding  . History of echocardiogram    Echo 5/18: EF 60-65, no RWMA, Gr 2 DD, mild-mod MR, mod LAE // Echo 2/17: Mild LVH, mild focal basal septal hypertrophy, EF 55-60%, no RWMA, Gr 1 DD, MAC, mild MR, mild  // Echo 4/14: EF 60-65%, normal wall motion, Gr 1 DD, MAC, mild MR, PASP 32, reduced excursion of AV noncoronary cusp.    Marland Kitchen HYPERLIPIDEMIA 09/09/2006  . HYPERTENSION 09/09/2006  . LEFT BUNDLE BRANCH BLOCK 08/11/2006  . OSTEOARTHRITIS 08/11/2006  . OSTEOPOROSIS 08/11/2006  . PAF (paroxysmal atrial fibrillation) ([place]) 04/12/2009   Paroxysmal, only prior documented episode was years ago.  She was not started on anticoagulation back then due to hemorrhoidal bleeding, apparently profuse.  Event monitor (3/14) showed only NSR.    Marland Kitchen Stroke ([place]) 12-06-13  . Syncope    8/15. EEG (5/15) was unremarkable. // 4/18: ILR neg for arrhythmia - ? orthostatic (no prodrome reported) >> c/b SAH (small)   . TIA 09/02/2006   Echocardiogram 6/12: Mild to moderate MR, trace AI, trace TR, PASP 34, bubble study negative for intracardiac shunting, normal EF (greater than 55%) //  Possible TIA in 10/15 with visual field cut.  MRI (10/15) with no CVA. Linq monitor placed.    Patient Active Problem List   Diagnosis  Date Noted  . Protein-calorie malnutrition ([place]) 06/04/2017  . Acute diarrhea 03/07/2017  . Acute on chronic respiratory failure with hypoxia ([place]) 09/06/2016  . Fall 09/06/2016  . Chronic diastolic (congestive) heart failure ([place]) 09/06/2016  . Open fracture of nasal bones   . Traumatic subarachnoid bleed with LOC of 30 minutes or less, initial encounter ([place]) 06/03/2016  . ILD (interstitial lung disease) ([place]) 04/06/2015  . Cough 04/06/2015  . Shortness of breath 03/15/2015  . Dyspnea 03/15/2015  . SOB (shortness of breath)  03/09/2015  . Perennial allergic rhinitis 01/17/2014  . Transient alteration of awareness 11/04/2013  . Syncope 10/15/2013  . Chronic insomnia 06/30/2013  . Aftercare following surgery of the circulatory system, [place] 06/22/2013  . Seasonal allergies 06/08/2013  . Normocytic anemia 10/29/2012  . Benign paroxysmal positional vertigo 06/16/2012  . Vertigo 03/23/2012  . Carotid artery disease ([place]) 10/18/2010  . Amaurosis fugax 10/18/2010  . Palpitations 06/14/2010  . KNEE PAIN 01/24/2010  . COLONIC POLYPS, ADENOMATOUS 12/26/2009  . Internal and external bleeding hemorrhoids 12/26/2009  . CONSTIPATION 12/26/2009  . ANEMIA, NORMOCYTIC 11/30/2009  . FATIGUE / MALAISE 11/27/2009  . PAF (paroxysmal atrial fibrillation) ([place]) 04/12/2009  . COLONIC POLYPS, HYPERPLASTIC, HX OF 03/01/2009  . CAD (coronary artery disease) 04/27/2008  . GERD 02/16/2007  . Hyperlipidemia 09/09/2006  . Essential hypertension 09/09/2006  . Transient cerebral ischemia 09/02/2006  . Type 2 diabetes mellitus, controlled ([place]) 08/11/2006  . LEFT BUNDLE BRANCH BLOCK 08/11/2006  . DIVERTICULOSIS, COLON 08/11/2006  . Osteoarthritis 08/11/2006  . OSTEOPOROSIS 08/11/2006    Past Surgical History:  Procedure Laterality Date  . CAROTID ENDARTERECTOMY Left 12-05-2010  . CLOSED REDUCTION METATARSAL FRACTURE     right 5th  . COLONOSCOPY W/ POLYPECTOMY  2001  . EYE SURGERY     catarac  . LOOP RECORDER IMPLANT N/A 01/03/2014   Procedure: LOOP RECORDER IMPLANT;  Surgeon: Evans Lance, MD;  Location: [facility] CATH LAB;  Service: Cardiovascular;  Laterality: N/A;  . TONSILLECTOMY       OB History   None      Home Medications    Prior to Admission medications   Medication Sig Start Date End Date Taking? Authorizing Provider  acetaminophen (TYLENOL) 325 MG tablet Take 2 tablets (650 mg total) by mouth every 4 (four) hours as needed for mild pain (or temp > 37.5 C (99.5 F)). 06/04/16  Yes Geradine Girt, DO  Ascorbic  Acid (VITAMIN C PO) Take 1 tablet by mouth daily.    Yes [provider]  aspirin EC 81 MG tablet Take 1 tablet (81 mg total) by mouth daily. 07/05/16  Yes Weaver, Scott T, PA-C  atorvastatin (LIPITOR) 20 MG tablet TAKE 1 TABLET DAILY AT 6 P.M. 07/08/17  Yes [name], PA-C  calcium carbonate 200 MG capsule Take 200 mg by mouth daily.    Yes [provider]  Cholecalciferol (VITAMIN D PO) Take 1 tablet by mouth daily.    Yes [provider]  Cyanocobalamin (VITAMIN B 12 PO) Take 1 tablet by mouth daily.    Yes [provider]  fish oil-omega-3 fatty acids 1000 MG capsule Take 1 capsule by mouth daily.    Yes [provider]  gabapentin (NEURONTIN) 100 MG capsule Take 1 capsule (100 mg total) by mouth at bedtime. Toe pain 08/13/17  Yes Burchette, Alinda Sierras, MD  metoprolol tartrate (LOPRESSOR) 25 MG tablet TAKE ONE-HALF (1/2) TABLET TWICE A DAY 11/25/16  Yes Burchette, Alinda Sierras,  MD  Multiple Vitamins-Minerals (MULTIVITAMIN WITH MINERALS) tablet Take 1 tablet by mouth daily.   Yes [provider]  pantoprazole (PROTONIX) 20 MG tablet TAKE 1 TABLET EVERY OTHER DAY Patient taking differently: Take 0.5 tablet (10 mg) by mouth every other day 11/25/16  Yes Burchette, Alinda Sierras, MD  polyethylene glycol (MIRALAX / GLYCOLAX) packet Take 17 g by mouth daily as needed for moderate constipation.   Yes [provider]  VITAMIN E PO Take 1 tablet by mouth daily.    Yes [provider]  docusate sodium (COLACE) 100 MG capsule Take 1 capsule (100 mg total) by mouth 2 (two) times daily. Patient taking differently: Take 100 mg by mouth 2 (two) times daily as needed for mild constipation.  06/04/16   Eulogio Bear U, DO  glucose blood test strip Use once daily 12/01/14   Burchette, Alinda Sierras, MD  mirtazapine (REMERON) 7.5 MG tablet Take 1 tablet (7.5 mg total) by mouth at bedtime. Patient not taking: Reported on 08/16/2017 07/09/17   Eulas Post, MD   ONE TOUCH LANCETS MISC Use daily as directed 03/25/11   Eulas Post, MD    Family History Family History  Problem Relation Age of Onset  . Heart disease Mother 26  . Kidney disease Mother   . Heart attack Mother   . Congestive Heart Failure Father 20  . Colon cancer Brother   . Heart disease Brother   . Breast cancer Sister   . Uterine cancer Sister     Social History Social History   Tobacco Use  . Smoking status: Never Smoker  . Smokeless tobacco: Never Used  Substance Use Topics  . Alcohol use: No    Alcohol/week: 0.0 oz  . Drug use: No     Allergies   Sulfamethoxazole-trimethoprim; Sulfonamide derivatives; and Nitrofurantoin   Review of Systems Review of Systems  Constitutional: Positive for appetite change and fatigue. Negative for chills and fever.  HENT: Negative for congestion, rhinorrhea and sore throat.   Eyes: Negative for visual disturbance.  Respiratory: Positive for shortness of breath. Negative for cough.   Cardiovascular: Negative for chest pain.  Gastrointestinal: Positive for abdominal pain. Negative for blood in stool, constipation, diarrhea, nausea and vomiting.  Genitourinary: Negative for dysuria and frequency.  Musculoskeletal: Negative for arthralgias, back pain and myalgias.  Skin: Positive for pallor. Negative for color change and rash.  Neurological: Positive for weakness and light-headedness. Negative for dizziness, syncope, numbness and headaches.     Physical Exam Updated Vital Signs BP 124/69 (BP Location: Left Arm)   Pulse 80   Temp 97.9 F (36.6 C) (Oral)   Resp 15   SpO2 100%   Physical Exam  Constitutional: She is oriented to person, place, and time. She appears well-developed and well-nourished. No distress.  Patient sitting comfortably and in no acute distress  HENT:  Head: Normocephalic and atraumatic.  Mouth/Throat: Oropharynx is clear and moist.  Eyes: Pupils are equal, round, and reactive to light. EOM are  normal. Right eye exhibits no discharge. Left eye exhibits no discharge.  Neck: Neck supple.  Cardiovascular: Normal rate, regular rhythm, normal heart sounds and intact distal pulses.  Pulses:      Radial pulses are 2+ on the right side, and 2+ on the left side.       Dorsalis pedis pulses are 2+ on the right side, and 2+ on the left side.  Pulmonary/Chest: Effort normal and breath sounds normal. No stridor. No respiratory  distress. She has no wheezes. She has no rales.  Respirations equal and unlabored, patient able to speak in full sentences, lungs clear to auscultation bilaterally  Abdominal: Soft. Bowel sounds are normal. She exhibits no distension and no mass. There is tenderness. There is no guarding.  Abdomen soft, nondistended, bowel sounds present throughout, there is mild tenderness in the right lower quadrant without guarding, all other quadrants nontender to palpation, no CVA tenderness  Genitourinary:  Genitourinary Comments: Soft brown stool present in the rectal vault, no gross blood  Musculoskeletal: She exhibits no edema or deformity.  Neurological: She is alert and oriented to person, place, and time. Coordination normal.  Skin: Skin is warm and dry. Capillary refill takes less than 2 seconds. She is not diaphoretic. There is pallor.  Psychiatric: She has a normal mood and affect. Her behavior is normal.  Nursing note and vitals reviewed.    ED Treatments / Results  Labs (all labs ordered are listed, but only abnormal results are displayed) Labs Reviewed  COMPREHENSIVE METABOLIC PANEL - Abnormal; Notable for the following components:      Result Value   Glucose, Bld 152 (*)    BUN 35 (*)    Calcium 8.8 (*)    Albumin 2.9 (*)    GFR calc non Af Amer 54 (*)    All other components within normal limits  CBC WITH DIFFERENTIAL/PLATELET - Abnormal; Notable for the following components:   WBC 10.9 (*)    RBC 2.73 (*)    Hemoglobin 7.6 (*)    HCT 24.7 (*)    Neutro Abs  7.8 (*)    All other components within normal limits  IRON AND TIBC - Abnormal; Notable for the following components:   Saturation Ratios 9 (*)    All other components within normal limits  RETICULOCYTES - Abnormal; Notable for the following components:   RBC. 2.73 (*)    All other components within normal limits  POC OCCULT BLOOD, ED - Abnormal; Notable for the following components:   Fecal Occult Bld POSITIVE (*)    All other components within normal limits  PROTIME-INR  VITAMIN B12  FOLATE  FERRITIN  SAVE SMEAR  TYPE AND SCREEN  PREPARE RBC (CROSSMATCH)  ABO/RH    EKG EKG Interpretation  Date/Time:  Saturday August 16 2017 14:55:51 EDT Ventricular Rate:  83 PR Interval:    QRS Duration: 131 QT Interval:  433 QTC Calculation: 509 R Axis:   68 Text Interpretation:  Sinus rhythm Nonspecific intraventricular conduction delay Repol abnrm suggests ischemia, anterolateral Borderline ST elevation, anterior leads No STEMI.  ST changes similar to prior.  Confirmed by Nanda Quinton [phone]) on 08/16/2017 3:23:37 PM Also confirmed by Nanda Quinton [phone]), editor Philomena Doheny [phone])  on 08/16/2017 4:21:34 PM   Radiology Ct Abdomen Pelvis W Contrast  Result Date: 08/16/2017 CLINICAL DATA:  Anemia. GI bleed. Heme-positive stools. Intermittent right lower quadrant pain. EXAM: CT ABDOMEN AND PELVIS WITH CONTRAST TECHNIQUE: Multidetector CT imaging of the abdomen and pelvis was performed using the standard protocol following bolus administration of intravenous contrast. CONTRAST:  120m ISOVUE-300 IOPAMIDOL (ISOVUE-300) INJECTION 61% COMPARISON:  09/05/2016 CT pelvis. FINDINGS: Lower chest: Nonspecific patchy subpleural reticulation and ground-glass attenuation at both lung bases, not definitely changed since 03/16/2015. Coronary atherosclerosis. Hepatobiliary: Normal liver size. No liver mass. Mild diffuse intrahepatic biliary ductal dilatation. Dilated common bile duct (10 mm diameter). Faintly  calcified gallstones in the nondistended gallbladder with no definite gallbladder wall thickening or pericholecystic  fluid. Pancreas: Normal, with no mass or duct dilation. Spleen: Normal size. No mass. Adrenals/Urinary Tract: No discrete adrenal nodules. Normal kidneys with no hydronephrosis and no renal mass. Normal bladder. Stomach/Bowel: Normal non-distended stomach. Normal caliber small bowel with no small bowel wall thickening. Normal appendix. There is a large annular colonic mass involving the entire ascending colon measuring 4.7 x 3.5 x 7.5 cm (series 2/image 36). There is associated haziness of the pericolonic fat in the ascending colon. Marked sigmoid diverticulosis, with no sigmoid wall thickening or pericolonic fat stranding. Vascular/Lymphatic: Atherosclerotic nonaneurysmal abdominal aorta. Patent portal, splenic, hepatic and renal veins. A few mildly prominent rounded nodes in lateral right ascending colonic mesentery measuring up to 7 mm (series 2/image 32). Conglomerate central mesenteric adenopathy up to 2.1 cm (series 2/image 37). Enlarged 1.7 cm porta hepatis node (series 2/image 19). No pelvic adenopathy. Reproductive: Grossly normal uterus.  No adnexal mass. Other: No pneumoperitoneum, ascites or focal fluid collection. Musculoskeletal: No aggressive appearing focal osseous lesions. Severe T2 vertebral compression fracture of uncertain chronicity with 8 mm bony retropulsion into the anterior lumbar spinal canal. Moderate lumbar spondylosis. IMPRESSION: 1. Large annular colonic mass involving the entire ascending colon, highly suspicious for primary colonic malignancy. 2. Nonspecific pericolonic fat haziness associated with the ascending colonic mass, which could be due to tumor filtration or could be inflammatory due to wall perforation. No free air or abscess. No bowel obstruction. 3. Central mesenteric and porta hepatis lymphadenopathy suspicious for nodal metastatic disease. 4.  Cholelithiasis. No evidence of acute cholecystitis. Mild intrahepatic and extrahepatic biliary ductal dilatation with CBD diameter 10 mm. Recommend correlation with serum bilirubin levels. MRCP may be obtained to evaluate for choledocholithiasis, as clinically warranted. 5. Severe L2 vertebral compression fracture of uncertain chronicity. 6. Chronic findings include: Aortic Atherosclerosis (ICD10-I70.0). Coronary atherosclerosis. Marked sigmoid diverticulosis. Electronically Signed   By: Ilona Sorrel M.D.   On: 08/16/2017 17:23    Procedures Procedures (including critical care time)  Medications Ordered in ED Medications  iopamidol (ISOVUE-300) 61 % injection (has no administration in time range)  0.9 %  sodium chloride infusion (Manually program via Guardrails IV Fluids) ( Intravenous Given 08/16/17 1724)  famotidine (PEPCID) IVPB 20 mg premix (0 mg Intravenous Stopped 08/16/17 1724)  iopamidol (ISOVUE-300) 61 % injection 100 mL (100 mLs Intravenous Contrast Given 08/16/17 1642)    Initial Impression / Assessment and Plan / ED Course  I have reviewed the triage vital signs and the nursing notes.  Pertinent labs & imaging results that were available during my care of the patient were reviewed by me and considered in my medical decision making (see chart for details).  Patient presents to the emergency department for evaluation of low hemoglobin with associated fatigue, generalized weakness, lightheadedness and dyspnea on exertion.  At PCP office yesterday patient also had heme positive stools, she denies any gross blood per rectum or melena.  PCP note expresses concern for possible malignancy as source for bleeding patient has no history of prior GI bleed.  Patient reports some intermittent right lower quadrant pain.  No fevers or chills, no nausea, vomiting or hematemesis.  We will plan for labs and CT abdomen pelvis given unclear etiology of bleeding, and intermittent right lower quadrant pain.  Will  give Pepcid IV, and plan for transfusion if hemoglobin is confirmed to be or worse than yesterday.  Hemoglobin 7.6, Hemoccult positive with no gross blood on exam, no acute electrolyte derangements requiring intervention, BUN slightly elevated at 35, normal  renal function, no elevation of LFTs, anemia panel ordered.  Will transfuse 1 unit.  CT abdomen pelvis significant for large annular colonic mass concerning for primary colon cancer, there is some pericolonic haziness which could be due to tumor infiltration versus inflammation.  There is no free air or abscess present.  Central mesenteric and hepatic lymphadenopathy concerning for metastatic nodal disease.  There is also some cholelithiasis with some ductal dilatation, no elevation in LFTs at this time but patient may need MRCP.  Discussed these results with the patient and answered questions.  Patient will need to be admitted for further work-up of new primary colon mass, with symptomatic anemia.  5:53 PM spoke with Dr. Ardis Hughs with [facility] who will see the patient in consult tomorrow morning.  Will consult hospitalist for admission.  6:14 PM spoke with Dr. Baron Hamper with [facility] hospitalist who will see and admit the patient.   Final Clinical Impressions(s) / ED Diagnoses   Final diagnoses:  Gastrointestinal hemorrhage, unspecified gastrointestinal hemorrhage type  Symptomatic anemia  Colonic mass    ED Discharge Orders    None       Janet Berlin 08/16/17 Sullivan Lone, MD 08/16/17 2026

## 2017-08-16 NOTE — ED Notes (Signed)
Bed: HQ01 Expected date:  Expected time:  Means of arrival:  Comments: [facility]**Hgb 7.2**

## 2017-08-16 NOTE — ED Notes (Signed)
ED TO INPATIENT HANDOFF REPORT  Name/Age/Gender [name] 84 y.o. female  Code Status Code Status History    Date Active Date Inactive Code Status Order ID Comments User Context   09/06/2016 0140 09/07/2016 1717 DNR [id number]  Ivor Costa, MD ED   06/03/2016 0258 06/04/2016 1925 Full Code [id number]  Etta Quill, DO ED   03/15/2015 1943 03/17/2015 1845 Full Code [id number]  Mendel Corning, MD Inpatient    Questions for Most Recent Historical Code Status (Order [id number])    Question Answer Comment   In the event of cardiac or respiratory ARREST Do not call a "code blue"    In the event of cardiac or respiratory ARREST Do not perform Intubation, CPR, defibrillation or ACLS    In the event of cardiac or respiratory ARREST Use medication by any route, position, wound care, and other measures to relive pain and suffering. May use oxygen, suction and manual treatment of airway obstruction as needed for comfort.       Home/SNF/Other Home  Chief Complaint low hgb  Level of Care/Admitting Diagnosis ED Disposition    ED Disposition Condition [facility]: [place] [[id number]]  Level of Care: Telemetry [5]  Admit to tele based on following criteria: Monitor for Ischemic changes  Admit to tele based on following criteria: Other see comments  Comments: blood loss anemia  Diagnosis: Blood loss anemia [[id number]]  Admitting Physician: Shelbie Proctor [[id number]]  Attending Physician: Shelbie Proctor [[id number]]  Estimated length of stay: 3 - 4 days  Certification:: I certify this patient will need inpatient services for at least 2 midnights  PT Class (Do Not Modify): Inpatient [101]  PT Acc Code (Do Not Modify): Private [1]       Medical History Past Medical History:  Diagnosis Date  . ANEMIA, NORMOCYTIC 11/30/2009  . CAD (coronary artery disease) 04/27/2008   Non-Obstructive >> Cardiac catheterization 6/08: EF 55%, 1+ MR, Dx 50%, LAD 50%,  dLAD 40%, OM1 50-60% (up to 70%), pOM 30%, pRCA 40%, mRCA 50-60%, pPDA 50-60% (up to 70%).  Medical therapy was recommended // MV 11/14: Normal stress nuclear study. LV Ejection Fraction: 67%.   . Carotid artery disease ([place])    Left CEA in 10/12.  Carotid dopplers (10/15) with 1-39% bilateral ICA stenosis. // Carotid US 10/17: R < 40; patent L CEA site  . COLONIC POLYPS, ADENOMATOUS 12/26/2009  . DIABETES MELLITUS, TYPE II 08/11/2006  . DIVERTICULOSIS, COLON 08/11/2006  . GERD 02/16/2007  . Hemorrhoids    s/p banding  . History of echocardiogram    Echo 5/18: EF 60-65, no RWMA, Gr 2 DD, mild-mod MR, mod LAE // Echo 2/17: Mild LVH, mild focal basal septal hypertrophy, EF 55-60%, no RWMA, Gr 1 DD, MAC, mild MR, mild  // Echo 4/14: EF 60-65%, normal wall motion, Gr 1 DD, MAC, mild MR, PASP 32, reduced excursion of AV noncoronary cusp.    Marland Kitchen HYPERLIPIDEMIA 09/09/2006  . HYPERTENSION 09/09/2006  . LEFT BUNDLE BRANCH BLOCK 08/11/2006  . OSTEOARTHRITIS 08/11/2006  . OSTEOPOROSIS 08/11/2006  . PAF (paroxysmal atrial fibrillation) ([place]) 04/12/2009   Paroxysmal, only prior documented episode was years ago.  She was not started on anticoagulation back then due to hemorrhoidal bleeding, apparently profuse.  Event monitor (3/14) showed only NSR.    Marland Kitchen Stroke ([place]) 12-06-13  . Syncope    8/15. EEG (5/15) was unremarkable. // 4/18: ILR neg for arrhythmia - ? orthostatic (no prodrome  reported) >> c/b SAH (small)   . TIA 09/02/2006   Echocardiogram 6/12: Mild to moderate MR, trace AI, trace TR, PASP 34, bubble study negative for intracardiac shunting, normal EF (greater than 55%) //  Possible TIA in 10/15 with visual field cut.  MRI (10/15) with no CVA. Linq monitor placed.    Allergies Allergies  Allergen Reactions  . Sulfamethoxazole-Trimethoprim     unknown  . Sulfonamide Derivatives     REACTION: rash  . Nitrofurantoin     Unknown. Macrobid.     IV Location/Drains/Wounds Patient Lines/Drains/Airways  Status   Active Line/Drains/Airways    Name:   Placement date:   Placement time:   Site:   Days:   Peripheral IV 08/16/17 Left Forearm   08/16/17    1456    Forearm   less than 1          Labs/Imaging Results for orders placed or performed during the hospital encounter of 08/16/17 (from the past 48 hour(s))  Type and screen [place]     Status: None (Preliminary result)   Collection Time: 08/16/17  3:08 PM  Result Value Ref Range   ABO/RH(D) A POS    Antibody Screen NEG    Sample Expiration 08/19/2017    Unit Number [id number]    Blood Component Type RED CELLS,LR    Unit division 00    Status of Unit ISSUED    Transfusion Status OK TO TRANSFUSE    Crossmatch Result      Compatible Performed at [place], [place] [address] [place], [place] [address]    Unit Number [id number]    Blood Component Type RED CELLS,LR    Unit division 00    Status of Unit ALLOCATED    Transfusion Status OK TO TRANSFUSE    Crossmatch Result Compatible   Vitamin B12     Status: None   Collection Time: 08/16/17  3:19 PM  Result Value Ref Range   Vitamin B-12 593 180 - 914 pg/mL    Comment: (NOTE) This assay is not validated for testing neonatal or myeloproliferative syndrome specimens for Vitamin B12 levels. Performed at [facility], [place] [address], [place], [place] [address]   Folate     Status: None   Collection Time: 08/16/17  3:19 PM  Result Value Ref Range   Folate 21.7 >5.9 ng/mL    Comment: Performed at [facility], [place] [address], [place], [place] [address]  Iron and TIBC     Status: Abnormal   Collection Time: 08/16/17  3:19 PM  Result Value Ref Range   Iron 29 28 - 170 ug/dL   TIBC 337 250 - 450 ug/dL   Saturation Ratios 9 (L) 10.4 - 31.8 %   UIBC 308 ug/dL    Comment: Performed at [facility], [place] [address] [place], [place] [address]  Ferritin     Status: None    Collection Time: 08/16/17  3:19 PM  Result Value Ref Range   Ferritin 19 11 - 307 ng/mL    Comment: Performed at [facility], [place] [address] [place], [place] [address]  Reticulocytes     Status: Abnormal   Collection Time: 08/16/17  3:19 PM  Result Value Ref Range   Retic Ct Pct 2.8 0.4 - 3.1 %   RBC. 2.73 (L) 3.87 - 5.11 MIL/uL   Retic Count, Absolute 76.4 19.0 - 186.0 K/uL    Comment:  Performed at [facility], [place] [address], [place], [place] [address]  Save smear     Status: None   Collection Time: 08/16/17  3:19 PM  Result Value Ref Range   Smear Review SMEAR STAINED AND AVAILABLE FOR REVIEW     Comment: Performed at [facility], [place] [address], [place], [place] [address]  Comprehensive metabolic panel     Status: Abnormal   Collection Time: 08/16/17  3:32 PM  Result Value Ref Range   Sodium 140 135 - 145 mmol/L   Potassium 4.0 3.5 - 5.1 mmol/L   Chloride 102 98 - 111 mmol/L    Comment: Please note change in reference range.   CO2 28 22 - 32 mmol/L   Glucose, Bld 152 (H) 70 - 99 mg/dL    Comment: Please note change in reference range.   BUN 35 (H) 8 - 23 mg/dL    Comment: Please note change in reference range.   Creatinine, Ser 0.88 0.44 - 1.00 mg/dL   Calcium 8.8 (L) 8.9 - 10.3 mg/dL   Total Protein 7.4 6.5 - 8.1 g/dL   Albumin 2.9 (L) 3.5 - 5.0 g/dL   AST 25 15 - 41 U/L   ALT 17 0 - 44 U/L    Comment: Please note change in reference range.   Alkaline Phosphatase 41 38 - 126 U/L   Total Bilirubin 0.4 0.3 - 1.2 mg/dL   GFR calc non Af Amer 54 (L) >60 mL/min   GFR calc Af Amer >60 >60 mL/min    Comment: (NOTE) The eGFR has been calculated using the CKD EPI equation. This calculation has not been validated in all clinical situations. eGFR's persistently <60 mL/min signify possible Chronic Kidney Disease.    Anion gap 10 5 - 15    Comment: Performed at [facility], [place] [address] [address]  [place], [place] [address]  CBC WITH DIFFERENTIAL     Status: Abnormal   Collection Time: 08/16/17  3:32 PM  Result Value Ref Range   WBC 10.9 (H) 4.0 - 10.5 K/uL   RBC 2.73 (L) 3.87 - 5.11 MIL/uL   Hemoglobin 7.6 (L) 12.0 - 15.0 g/dL   HCT 24.7 (L) 36.0 - 46.0 %   MCV 90.5 78.0 - 100.0 fL   MCH 27.8 26.0 - 34.0 pg   MCHC 30.8 30.0 - 36.0 g/dL   RDW 15.3 11.5 - 15.5 %   Platelets 315 150 - 400 K/uL   Neutrophils Relative % 71 %   Neutro Abs 7.8 (H) 1.7 - 7.7 K/uL   Lymphocytes Relative 20 %   Lymphs Abs 2.1 0.7 - 4.0 K/uL   Monocytes Relative 7 %   Monocytes Absolute 0.8 0.1 - 1.0 K/uL   Eosinophils Relative 2 %   Eosinophils Absolute 0.2 0.0 - 0.7 K/uL   Basophils Relative 0 %   Basophils Absolute 0.0 0.0 - 0.1 K/uL    Comment: Performed at [facility], [place] [address], [place], [place] [address]  Protime-INR     Status: None   Collection Time: 08/16/17  3:32 PM  Result Value Ref Range   Prothrombin Time 13.8 11.4 - 15.2 seconds   INR 1.07     Comment: Performed at [facility], [place] [address] [place],  [address]  ABO/Rh     Status: None (Preliminary result)   Collection Time: 08/16/17  3:32 PM  Result Value Ref Range   ABO/RH(D)  A POS Performed at [facility], [place] [address], [place], [place] [address]   POC occult blood, ED     Status: Abnormal   Collection Time: 08/16/17  3:56 PM  Result Value Ref Range   Fecal Occult Bld POSITIVE (A) NEGATIVE  Prepare RBC     Status: None   Collection Time: 08/16/17  4:09 PM  Result Value Ref Range   Order Confirmation      ORDER PROCESSED BY BLOOD BANK Performed at [place] [address], [place], [place] [address]    Ct Abdomen Pelvis W Contrast  Result Date: 08/16/2017 CLINICAL DATA:  Anemia. GI bleed. Heme-positive stools. Intermittent right lower quadrant pain. EXAM: CT ABDOMEN AND PELVIS WITH CONTRAST TECHNIQUE: Multidetector CT  imaging of the abdomen and pelvis was performed using the standard protocol following bolus administration of intravenous contrast. CONTRAST:  117m ISOVUE-300 IOPAMIDOL (ISOVUE-300) INJECTION 61% COMPARISON:  09/05/2016 CT pelvis. FINDINGS: Lower chest: Nonspecific patchy subpleural reticulation and ground-glass attenuation at both lung bases, not definitely changed since 03/16/2015. Coronary atherosclerosis. Hepatobiliary: Normal liver size. No liver mass. Mild diffuse intrahepatic biliary ductal dilatation. Dilated common bile duct (10 mm diameter). Faintly calcified gallstones in the nondistended gallbladder with no definite gallbladder wall thickening or pericholecystic fluid. Pancreas: Normal, with no mass or duct dilation. Spleen: Normal size. No mass. Adrenals/Urinary Tract: No discrete adrenal nodules. Normal kidneys with no hydronephrosis and no renal mass. Normal bladder. Stomach/Bowel: Normal non-distended stomach. Normal caliber small bowel with no small bowel wall thickening. Normal appendix. There is a large annular colonic mass involving the entire ascending colon measuring 4.7 x 3.5 x 7.5 cm (series 2/image 36). There is associated haziness of the pericolonic fat in the ascending colon. Marked sigmoid diverticulosis, with no sigmoid wall thickening or pericolonic fat stranding. Vascular/Lymphatic: Atherosclerotic nonaneurysmal abdominal aorta. Patent portal, splenic, hepatic and renal veins. A few mildly prominent rounded nodes in lateral right ascending colonic mesentery measuring up to 7 mm (series 2/image 32). Conglomerate central mesenteric adenopathy up to 2.1 cm (series 2/image 37). Enlarged 1.7 cm porta hepatis node (series 2/image 19). No pelvic adenopathy. Reproductive: Grossly normal uterus.  No adnexal mass. Other: No pneumoperitoneum, ascites or focal fluid collection. Musculoskeletal: No aggressive appearing focal osseous lesions. Severe T2 vertebral compression fracture of uncertain  chronicity with 8 mm bony retropulsion into the anterior lumbar spinal canal. Moderate lumbar spondylosis. IMPRESSION: 1. Large annular colonic mass involving the entire ascending colon, highly suspicious for primary colonic malignancy. 2. Nonspecific pericolonic fat haziness associated with the ascending colonic mass, which could be due to tumor filtration or could be inflammatory due to wall perforation. No free air or abscess. No bowel obstruction. 3. Central mesenteric and porta hepatis lymphadenopathy suspicious for nodal metastatic disease. 4. Cholelithiasis. No evidence of acute cholecystitis. Mild intrahepatic and extrahepatic biliary ductal dilatation with CBD diameter 10 mm. Recommend correlation with serum bilirubin levels. MRCP may be obtained to evaluate for choledocholithiasis, as clinically warranted. 5. Severe L2 vertebral compression fracture of uncertain chronicity. 6. Chronic findings include: Aortic Atherosclerosis (ICD10-I70.0). Coronary atherosclerosis. Marked sigmoid diverticulosis. Electronically Signed   By: JIlona SorrelM.D.   On: 08/16/2017 17:23    Pending Labs Unresulted Labs (From admission, onward)   None      Vitals/Pain Today's Vitals   08/16/17 1730 08/16/17 1745 08/16/17 1800 08/16/17 1830  BP: (!) 148/64 (!) 150/65 (!) 154/69 (!) 158/71  Pulse: 85 84 84 84  Resp: 20 18 17  (!)  23  Temp: 97.8 F (36.6 C) 97.8 F (36.6 C)    TempSrc:      SpO2: 99% 98% 98% 99%    Isolation Precautions No active isolations  Medications Medications  iopamidol (ISOVUE-300) 61 % injection (has no administration in time range)  0.9 %  sodium chloride infusion (Manually program via Guardrails IV Fluids) ( Intravenous Given 08/16/17 1724)  famotidine (PEPCID) IVPB 20 mg premix (0 mg Intravenous Stopped 08/16/17 1724)  iopamidol (ISOVUE-300) 61 % injection 100 mL (100 mLs Intravenous Contrast Given 08/16/17 1642)    Mobility walks

## 2017-08-16 NOTE — H&P (Addendum)
H&P        History and Physical    [name] CVE:[id number] DOB: 08-20-1920 DOA: 08/16/2017  PCP: Eulas Post, MD (Confirm with patient/family/NH records and if not entered, this has to be entered at [facility] point of entry) Patient coming from: Home   I have personally reviewed old medical records   Chief Complaint: Fatigue, weakness  HPI: [name] is a 84 y.o. female with medical history significant of coronary artery disease, paroxysmal A. fib ,diet-controlled type 2 diabetes presents with severe fatigue and weakness over the past several months.  Her symptoms  have become markedly more noticeable over the past few weeks.  She has been to see her PCP numerous times for this.  She has recently noticed that she was unable to even do her ADLs without significant weakness and having to stop numerous times.  Patient lives in the area alone.  She only uses a cane as needed.  was very high functioning until  the past few months when her weakness has limited her.  She saw her PCP this past week she was found to have a hemoglobin around 7.22.  That was down from about 10 in April of this year.  Admitted  to dark black stool once before but not again. She has had Intermittent right lower quadrant abdominal pain.  Denies any nausea vomiting.  She is lost 20 pounds over the past several months.  She states in January for about 3 months she has severe diarrhea.  She did see a GI doctor in the area but she is unsure of the groups name.  Patient denies any chest pain but has had dyspnea on exertion.  Does take Plavix and aspirin.  She does have a remote history of TIA.  Patient was found to be anemic here. She was heme Positive.  Also found to have a large colonic mass on abdominal CT.  sHe has received 1 unit of packed red blood cells in the ED.  ED called GI Dr. Ardis Hughs who will see patient in the morning for consultation.   ED Course: CT of the abdomen and pelvis with contrast showed a large annular  colonic mass involving the entire descending colon highly suspicious for primary colonic malignancy.  Was also central mesenteric and porta hepatis lymphadenopathy suspicious for nodal metastatic disease.  Mild intrahepatic and extrahepatic biliary ductal dilatation.  There was also severe air to vertebral compression fracture of unknown chronicity. -Patient was given 1 unit of packed red blood cells. -GI Dr. Ardis Hughs was called  Review of Systems: As per HPI otherwise 10 point review of systems negative.  Positive for fatigue ,dyspnea on exertion ,generalized weakness,20 pound weight loss , mild right lower quadrant abdominal pain denies headache chest pain cough fever, night sweats, back pain All others reviewed and otherwise negative   Past Medical History:  Diagnosis Date  . ANEMIA, NORMOCYTIC 11/30/2009  . CAD (coronary artery disease) 04/27/2008   Non-Obstructive >> Cardiac catheterization 6/08: EF 55%, 1+ MR, Dx 50%, LAD 50%, dLAD 40%, OM1 50-60% (up to 70%), pOM 30%, pRCA 40%, mRCA 50-60%, pPDA 50-60% (up to 70%).  Medical therapy was recommended // MV 11/14: Normal stress nuclear study. LV Ejection Fraction: 67%.   . Carotid artery disease ([place])    Left CEA in 10/12.  Carotid dopplers (10/15) with 1-39% bilateral ICA stenosis. // Carotid US 10/17: R < 40; patent L CEA site  . COLONIC POLYPS, ADENOMATOUS 12/26/2009  . DIABETES  MELLITUS, TYPE II 08/11/2006  . DIVERTICULOSIS, COLON 08/11/2006  . GERD 02/16/2007  . Hemorrhoids    s/p banding  . History of echocardiogram    Echo 5/18: EF 60-65, no RWMA, Gr 2 DD, mild-mod MR, mod LAE // Echo 2/17: Mild LVH, mild focal basal septal hypertrophy, EF 55-60%, no RWMA, Gr 1 DD, MAC, mild MR, mild  // Echo 4/14: EF 60-65%, normal wall motion, Gr 1 DD, MAC, mild MR, PASP 32, reduced excursion of AV noncoronary cusp.    Marland Kitchen HYPERLIPIDEMIA 09/09/2006  . HYPERTENSION 09/09/2006  . LEFT BUNDLE BRANCH BLOCK 08/11/2006  . OSTEOARTHRITIS 08/11/2006  .  OSTEOPOROSIS 08/11/2006  . PAF (paroxysmal atrial fibrillation) ([place]) 04/12/2009   Paroxysmal, only prior documented episode was years ago.  She was not started on anticoagulation back then due to hemorrhoidal bleeding, apparently profuse.  Event monitor (3/14) showed only NSR.    Marland Kitchen Stroke ([place]) 12-06-13  . Syncope    8/15. EEG (5/15) was unremarkable. // 4/18: ILR neg for arrhythmia - ? orthostatic (no prodrome reported) >> c/b SAH (small)   . TIA 09/02/2006   Echocardiogram 6/12: Mild to moderate MR, trace AI, trace TR, PASP 34, bubble study negative for intracardiac shunting, normal EF (greater than 55%) //  Possible TIA in 10/15 with visual field cut.  MRI (10/15) with no CVA. Linq monitor placed.    Past Surgical History:  Procedure Laterality Date  . CAROTID ENDARTERECTOMY Left 12-05-2010  . CLOSED REDUCTION METATARSAL FRACTURE     right 5th  . COLONOSCOPY W/ POLYPECTOMY  2001  . EYE SURGERY     catarac  . LOOP RECORDER IMPLANT N/A 01/03/2014   Procedure: LOOP RECORDER IMPLANT;  Surgeon: Evans Lance, MD;  Location: [facility] CATH LAB;  Service: Cardiovascular;  Laterality: N/A;  . TONSILLECTOMY       reports that she has never smoked. She has never used smokeless tobacco. She reports that she does not drink alcohol or use drugs.  Allergies  Allergen Reactions  . Sulfamethoxazole-Trimethoprim     unknown  . Sulfonamide Derivatives     REACTION: rash  . Nitrofurantoin     Unknown. Macrobid.     Family History  Problem Relation Age of Onset  . Heart disease Mother 25  . Kidney disease Mother   . Heart attack Mother   . Congestive Heart Failure Father 24  . Colon cancer Brother   . Heart disease Brother   . Breast cancer Sister   . Uterine cancer Sister   Family history reviewed and is noncontributory due to advanced age   Prior to Admission medications   Medication Sig Start Date End Date Taking? Authorizing Provider  acetaminophen (TYLENOL) 325 MG tablet Take 2  tablets (650 mg total) by mouth every 4 (four) hours as needed for mild pain (or temp > 37.5 C (99.5 F)). 06/04/16  Yes Geradine Girt, DO  Ascorbic Acid (VITAMIN C PO) Take 1 tablet by mouth daily.    Yes [provider]  aspirin EC 81 MG tablet Take 1 tablet (81 mg total) by mouth daily. 07/05/16  Yes Weaver, Scott T, PA-C  atorvastatin (LIPITOR) 20 MG tablet TAKE 1 TABLET DAILY AT 6 P.M. 07/08/17  Yes [name], PA-C  calcium carbonate 200 MG capsule Take 200 mg by mouth daily.    Yes [provider]  Cholecalciferol (VITAMIN D PO) Take 1 tablet by mouth daily.    Yes [provider]  Cyanocobalamin (  VITAMIN B 12 PO) Take 1 tablet by mouth daily.    Yes [provider]  fish oil-omega-3 fatty acids 1000 MG capsule Take 1 capsule by mouth daily.    Yes [provider]       Eulas Post, MD  metoprolol tartrate (LOPRESSOR) 25 MG tablet TAKE ONE-HALF (1/2) TABLET TWICE A DAY 11/25/16  Yes Burchette, Alinda Sierras, MD  Multiple Vitamins-Minerals (MULTIVITAMIN WITH MINERALS) tablet Take 1 tablet by mouth daily.   Yes [provider]  pantoprazole (PROTONIX) 20 MG tablet TAKE 1 TABLET EVERY OTHER DAY Patient taking differently: Take 0.5 tablet (10 mg) by mouth every other day 11/25/16  Yes Burchette, Alinda Sierras, MD  polyethylene glycol (MIRALAX / GLYCOLAX) packet Take 17 g by mouth daily as needed for moderate constipation.   Yes [provider]  VITAMIN E PO Take 1 tablet by mouth daily.    Yes [provider]  docusate sodium (COLACE) 100 MG capsule Take 1 capsule (100 mg total) by mouth 2 (two) times daily. Patient taking differently: Take 100 mg by mouth 2 (two) times daily as needed for mild constipation.  06/04/16   Geradine Girt, DO  glucose blood test strip Use once daily 12/01/14   Burchette, Alinda Sierras, MD       Eulas Post, MD  ONE [facility] LANCETS MISC Use daily as directed 03/25/11   Eulas Post, MD     Physical Exam: Vitals:   08/16/17 1830 08/16/17 1900 08/16/17 1930 08/16/17 2000  BP: (!) 158/71 (!) 162/76 (!) 166/72 (!) 153/74  Pulse: 84 83 80 82  Resp: (!) _0 Temp:      TempSrc:      SpO2: 99% 98% 94% 96%    Constitutional: NAD, calm, comfortable Vitals:   08/16/17 1830 08/16/17 1900 08/16/17 1930 08/16/17 2000  BP: (!) 158/71 (!) 162/76 (!) 166/72 (!) 153/74  Pulse: 84 83 80 82  Resp: (!) _1 Temp:      TempSrc:      SpO2: 99% 98% 94% 96%   Eyes: PERRL, lids and conjunctivae normal ENMT: Mucous membranes are moist. Posterior pharynx clear of any exudate or lesions.Normal dentition.  Neck: normal, supple, no masses, no thyromegaly Respiratory: clear to auscultation bilaterally, no wheezing, no crackles. Normal respiratory effort. No accessory muscle use.  Cardiovascular: Regular rate and rhythm,  No extremity edema.  pedal pulses intact.  Abdomen: Very mild right lower quadrant tenderness no rebound or guarding bowel sounds positive.  Skin: no rashes, lesions, ulcers. No induration Neurologic: . Sensation intact, moves all extremities equally no focal neurologic deficit generally weak Psychiatric: Normal judgment and insight. Alert and oriented x 3. Normal mood.    Labs on Admission: I have personally reviewed following labs and imaging studies  CBC: Recent Labs  Lab 08/13/17 1546 08/15/17 1507 08/16/17 1532  WBC 10.9*  --  10.9*  NEUTROABS 7,488  --  7.8*  HGB 7.5* 7.6* 7.6*  HCT 24.2*  --  24.7*  MCV 87.4  --  90.5  PLT 311  --  427   Basic Metabolic Panel: Recent Labs  Lab 08/13/17 1546 08/16/17 1532  NA 138 140  K 4.8 4.0  CL 102 102  CO2 27 28  GLUCOSE 157* 152*  BUN 33* 35*  CREATININE 0.90* 0.88  CALCIUM 8.8 8.8*   GFR: Estimated Creatinine Clearance: 30.3 mL/min (by C-G formula based on SCr of 0.88  mg/dL). Liver Function Tests: Recent Labs  Lab 08/16/17 1532  AST 25  ALT 17  ALKPHOS 41  BILITOT 0.4  PROT 7.4   ALBUMIN 2.9*   No results for input(s): LIPASE, AMYLASE in the last 168 hours. No results for input(s): AMMONIA in the last 168 hours. Coagulation Profile: Recent Labs  Lab 08/16/17 1532  INR 1.07   Cardiac Enzymes: No results for input(s): CKTOTAL, CKMB, CKMBINDEX, TROPONINI in the last 168 hours. BNP (last 3 results) No results for input(s): PROBNP in the last 8760 hours. HbA1C: No results for input(s): HGBA1C in the last 72 hours. CBG: No results for input(s): GLUCAP in the last 168 hours. Lipid Profile: No results for input(s): CHOL, HDL, LDLCALC, TRIG, CHOLHDL, LDLDIRECT in the last 72 hours. Thyroid Function Tests: No results for input(s): TSH, T4TOTAL, FREET4, T3FREE, THYROIDAB in the last 72 hours. Anemia Panel: Recent Labs    08/16/17 1519  VITAMINB12 593  FOLATE 21.7  FERRITIN 19  TIBC 337  IRON 29  RETICCTPCT 2.8   Urine analysis:    Component Value Date/Time   COLORURINE YELLOW 02/14/2017 1553   APPEARANCEUR HAZY (A) 02/14/2017 1553   LABSPEC 1.017 02/14/2017 1553   PHURINE 5.0 02/14/2017 1553   GLUCOSEU NEGATIVE 02/14/2017 1553   HGBUR NEGATIVE 02/14/2017 1553   BILIRUBINUR NEGATIVE 02/14/2017 1553   BILIRUBINUR n 11/29/2013 1621   KETONESUR NEGATIVE 02/14/2017 1553   PROTEINUR NEGATIVE 02/14/2017 1553   UROBILINOGEN 0.2 12/01/2013 1606   NITRITE NEGATIVE 02/14/2017 1553   LEUKOCYTESUR LARGE (A) 02/14/2017 1553    Radiological Exams on Admission: Ct Abdomen Pelvis W Contrast  Result Date: 08/16/2017 CLINICAL DATA:  Anemia. GI bleed. Heme-positive stools. Intermittent right lower quadrant pain. EXAM: CT ABDOMEN AND PELVIS WITH CONTRAST TECHNIQUE: Multidetector CT imaging of the abdomen and pelvis was performed using the standard protocol following bolus administration of intravenous contrast. CONTRAST:  19m ISOVUE-300 IOPAMIDOL (ISOVUE-300) INJECTION 61% COMPARISON:  09/05/2016 CT pelvis. FINDINGS: Lower chest: Nonspecific patchy subpleural  reticulation and ground-glass attenuation at both lung bases, not definitely changed since 03/16/2015. Coronary atherosclerosis. Hepatobiliary: Normal liver size. No liver mass. Mild diffuse intrahepatic biliary ductal dilatation. Dilated common bile duct (10 mm diameter). Faintly calcified gallstones in the nondistended gallbladder with no definite gallbladder wall thickening or pericholecystic fluid. Pancreas: Normal, with no mass or duct dilation. Spleen: Normal size. No mass. Adrenals/Urinary Tract: No discrete adrenal nodules. Normal kidneys with no hydronephrosis and no renal mass. Normal bladder. Stomach/Bowel: Normal non-distended stomach. Normal caliber small bowel with no small bowel wall thickening. Normal appendix. There is a large annular colonic mass involving the entire ascending colon measuring 4.7 x 3.5 x 7.5 cm (series 2/image 36). There is associated haziness of the pericolonic fat in the ascending colon. Marked sigmoid diverticulosis, with no sigmoid wall thickening or pericolonic fat stranding. Vascular/Lymphatic: Atherosclerotic nonaneurysmal abdominal aorta. Patent portal, splenic, hepatic and renal veins. A few mildly prominent rounded nodes in lateral right ascending colonic mesentery measuring up to 7 mm (series 2/image 32). Conglomerate central mesenteric adenopathy up to 2.1 cm (series 2/image 37). Enlarged 1.7 cm porta hepatis node (series 2/image 19). No pelvic adenopathy. Reproductive: Grossly normal uterus.  No adnexal mass. Other: No pneumoperitoneum, ascites or focal fluid collection. Musculoskeletal: No aggressive appearing focal osseous lesions. Severe T2 vertebral compression fracture of uncertain chronicity with 8 mm bony retropulsion into the anterior lumbar spinal canal. Moderate lumbar spondylosis. IMPRESSION: 1. Large annular colonic mass involving the entire ascending colon, highly suspicious  for primary colonic malignancy. 2. Nonspecific pericolonic fat haziness associated  with the ascending colonic mass, which could be due to tumor filtration or could be inflammatory due to wall perforation. No free air or abscess. No bowel obstruction. 3. Central mesenteric and porta hepatis lymphadenopathy suspicious for nodal metastatic disease. 4. Cholelithiasis. No evidence of acute cholecystitis. Mild intrahepatic and extrahepatic biliary ductal dilatation with CBD diameter 10 mm. Recommend correlation with serum bilirubin levels. MRCP may be obtained to evaluate for choledocholithiasis, as clinically warranted. 5. Severe L2 vertebral compression fracture of uncertain chronicity. 6. Chronic findings include: Aortic Atherosclerosis (ICD10-I70.0). Coronary atherosclerosis. Marked sigmoid diverticulosis. Electronically Signed   By: Ilona Sorrel M.D.   On: 08/16/2017 17:23      Assessment/Plan Principal Problem:   Blood loss anemia   Colonic mass- seen on abd CT Active Problems:   Type 2 diabetes mellitus, diet controlled (HCC)   Hyperlipidemia on statin   CAD (coronary artery disease) on Plavix, follows  with Dr.NAHSER  PAF (paroxysmal atrial fibrillation) (HCC) daily aspirin, NOT Coumadin candidate due to history of hemorrhoidal bleeding   Fatigue associated with anemia   History of TIA (transient ischemic attack) Diastolic dysfunction echo February 2017 showed grade 1 diastolic dysfunction EF 55 to 60%  -Inpatient admit to telemetry. -Received 1 unit packed red blood cells in the ED.  Serial H&H's. -Hold patient's Plavix and aspirin. -GI Dr. Ardis Hughs consulted in the ED who will see patient in consultation.  She has Seen Dr. Loletha Carrow in the past -Continue home metoprolol and statin -Check hemoglobin A1c, sliding scale insulin. -Follow daily weights    DVT prophylaxis: SCDs  code Status: DNR, states she does have a living will  family: her children all live out of state but we will be arriving tomorrow Disposition Plan: To be determined as patient does live alone.    Consults called: *The ED call GI Dr. Ardis Hughs**  Admission status: Inpatient telemetry  Jakyla Reza Johnson-Pitts MD [facility] Pager [phone]  If 7PM-7AM, please contact night-coverage [email or web address] Password [facility]  08/16/2017, 8:23 PM

## 2017-08-17 DIAGNOSIS — R531 Weakness: Secondary | ICD-10-CM

## 2017-08-17 DIAGNOSIS — D649 Anemia, unspecified: Secondary | ICD-10-CM

## 2017-08-17 DIAGNOSIS — R634 Abnormal weight loss: Secondary | ICD-10-CM

## 2017-08-17 DIAGNOSIS — R1011 Right upper quadrant pain: Secondary | ICD-10-CM

## 2017-08-17 DIAGNOSIS — R1031 Right lower quadrant pain: Secondary | ICD-10-CM

## 2017-08-17 DIAGNOSIS — R933 Abnormal findings on diagnostic imaging of other parts of digestive tract: Secondary | ICD-10-CM

## 2017-08-17 LAB — HEMOGLOBIN AND HEMATOCRIT, BLOOD
HCT: 27.6 % — ABNORMAL LOW (ref 36.0–46.0)
HCT: 28.2 % — ABNORMAL LOW (ref 36.0–46.0)
HCT: 29.3 % — ABNORMAL LOW (ref 36.0–46.0)
HEMATOCRIT: 28 % — AB (ref 36.0–46.0)
HEMATOCRIT: 29.4 % — AB (ref 36.0–46.0)
HEMOGLOBIN: 8.9 g/dL — AB (ref 12.0–15.0)
HEMOGLOBIN: 8.9 g/dL — AB (ref 12.0–15.0)
HEMOGLOBIN: 9.2 g/dL — AB (ref 12.0–15.0)
HEMOGLOBIN: 9.3 g/dL — AB (ref 12.0–15.0)
Hemoglobin: 8.9 g/dL — ABNORMAL LOW (ref 12.0–15.0)

## 2017-08-17 LAB — GLUCOSE, CAPILLARY
GLUCOSE-CAPILLARY: 101 mg/dL — AB (ref 70–99)
Glucose-Capillary: 125 mg/dL — ABNORMAL HIGH (ref 70–99)
Glucose-Capillary: 294 mg/dL — ABNORMAL HIGH (ref 70–99)
Glucose-Capillary: 168 mg/dL — ABNORMAL HIGH (ref 70–99)

## 2017-08-17 LAB — HEMOGLOBIN A1C
HEMOGLOBIN A1C: 6.4 % — AB (ref 4.8–5.6)
Mean Plasma Glucose: 136.98 mg/dL

## 2017-08-17 LAB — ABO/RH: ABO/RH(D): A POS

## 2017-08-17 MED ORDER — GLUCERNA SHAKE PO LIQD
237.0000 mL | Freq: Three times a day (TID) | ORAL | Status: DC
  Administered 2017-08-17: 237 mL via ORAL
  Filled 2017-08-17 (×3): qty 237

## 2017-08-17 MED ORDER — ZOLPIDEM TARTRATE 5 MG PO TABS
5.0000 mg | ORAL_TABLET | Freq: Every evening | ORAL | Status: DC | PRN
  Administered 2017-08-17 – 2017-08-20 (×2): 5 mg via ORAL
  Filled 2017-08-17 (×3): qty 1

## 2017-08-17 MED ORDER — ALUM & MAG HYDROXIDE-SIMETH 200-200-20 MG/5ML PO SUSP
30.0000 mL | ORAL | Status: DC | PRN
  Administered 2017-08-17 (×2): 30 mL via ORAL
  Filled 2017-08-17 (×2): qty 30

## 2017-08-17 NOTE — Consult Note (Addendum)
Consultation  Referring Provider: [facility] hospitalist/Johnson -Charlie Pitter Primary Care Physician:  Eulas Post, MD Primary Gastroenterologist:  Dr Danis/ Former Dr Olevia Perches  Reason for Consultation:  Colon cancer  HPI: [name] is a 84 y.o. female who was admitted last night through the emergency room with complaints of progressive weakness.  She had been found to have progressive anemia by her PCP last week with hemoglobin down from 10.62 months ago to 7.5. She also has had complaints of mild right-sided abdominal discomfort, decrease in appetite and weight loss of about 20 pounds over the past 3 to 4 months. Other medical problems include adult onset diabetes mellitus, hypertension, coronary artery disease and history of atrial fibrillation.  She has had adenomatous colon polyps, and has family history of colon cancer in her brother. CT scan of the abdomen and pelvis was done last night showing cholelithiasis, and a large colonic mass involving the entire a sending colon, measuring 4.7 x 3.5 x 7.5 cm.  There is also haziness of the pericolonic fat in around the ascending colon and central mesenteric and porta hepatis lymphadenopathy suspicious for nodal mets.  Liver appears normal. Patient was transfused last night and hemoglobin is 8.9 this morning hematocrit of 28.2, LFTs are unremarkable.  Last colonoscopy was done in 2011 per Dr. Olevia Perches, she had 2 small polyps removed at that time from the right colon and was noted to have mild diverticulosis.  Path on the polyps were consistent with tubular adenomas.  She was not recommended for follow-up colonoscopy due to advanced age..  On conversation with the patient this morning, she is aware of the CT findings, and is asking good questions.  She is waiting for 2 of her sons to return from out of town and would like to talk with them prior to making any decisions regarding management, colonoscopy etc.   Past Medical History:  Diagnosis Date   . ANEMIA, NORMOCYTIC 11/30/2009  . CAD (coronary artery disease) 04/27/2008   Non-Obstructive >> Cardiac catheterization 6/08: EF 55%, 1+ MR, Dx 50%, LAD 50%, dLAD 40%, OM1 50-60% (up to 70%), pOM 30%, pRCA 40%, mRCA 50-60%, pPDA 50-60% (up to 70%).  Medical therapy was recommended // MV 11/14: Normal stress nuclear study. LV Ejection Fraction: 67%.   . Carotid artery disease ([place])    Left CEA in 10/12.  Carotid dopplers (10/15) with 1-39% bilateral ICA stenosis. // Carotid US 10/17: R < 40; patent L CEA site  . COLONIC POLYPS, ADENOMATOUS 12/26/2009  . DIABETES MELLITUS, TYPE II 08/11/2006  . DIVERTICULOSIS, COLON 08/11/2006  . GERD 02/16/2007  . Hemorrhoids    s/p banding  . History of echocardiogram    Echo 5/18: EF 60-65, no RWMA, Gr 2 DD, mild-mod MR, mod LAE // Echo 2/17: Mild LVH, mild focal basal septal hypertrophy, EF 55-60%, no RWMA, Gr 1 DD, MAC, mild MR, mild  // Echo 4/14: EF 60-65%, normal wall motion, Gr 1 DD, MAC, mild MR, PASP 32, reduced excursion of AV noncoronary cusp.    Marland Kitchen HYPERLIPIDEMIA 09/09/2006  . HYPERTENSION 09/09/2006  . LEFT BUNDLE BRANCH BLOCK 08/11/2006  . OSTEOARTHRITIS 08/11/2006  . OSTEOPOROSIS 08/11/2006  . PAF (paroxysmal atrial fibrillation) ([place]) 04/12/2009   Paroxysmal, only prior documented episode was years ago.  She was not started on anticoagulation back then due to hemorrhoidal bleeding, apparently profuse.  Event monitor (3/14) showed only NSR.    Marland Kitchen Stroke ([place]) 12-06-13  . Syncope    8/15. EEG (5/15)  was unremarkable. // 4/18: ILR neg for arrhythmia - ? orthostatic (no prodrome reported) >> c/b SAH (small)   . TIA 09/02/2006   Echocardiogram 6/12: Mild to moderate MR, trace AI, trace TR, PASP 34, bubble study negative for intracardiac shunting, normal EF (greater than 55%) //  Possible TIA in 10/15 with visual field cut.  MRI (10/15) with no CVA. Linq monitor placed.    Past Surgical History:  Procedure Laterality Date  . CAROTID ENDARTERECTOMY Left  12-05-2010  . CLOSED REDUCTION METATARSAL FRACTURE     right 5th  . COLONOSCOPY W/ POLYPECTOMY  2001  . EYE SURGERY     catarac  . LOOP RECORDER IMPLANT N/A 01/03/2014   Procedure: LOOP RECORDER IMPLANT;  Surgeon: Evans Lance, MD;  Location: [facility] CATH LAB;  Service: Cardiovascular;  Laterality: N/A;  . TONSILLECTOMY      Prior to Admission medications   Medication Sig Start Date End Date Taking? Authorizing Provider  acetaminophen (TYLENOL) 325 MG tablet Take 2 tablets (650 mg total) by mouth every 4 (four) hours as needed for mild pain (or temp > 37.5 C (99.5 F)). 06/04/16  Yes Geradine Girt, DO  Ascorbic Acid (VITAMIN C PO) Take 1 tablet by mouth daily.    Yes [provider]  aspirin EC 81 MG tablet Take 1 tablet (81 mg total) by mouth daily. 07/05/16  Yes Weaver, Scott T, PA-C  atorvastatin (LIPITOR) 20 MG tablet TAKE 1 TABLET DAILY AT 6 P.M. 07/08/17  Yes [name], PA-C  calcium carbonate 200 MG capsule Take 200 mg by mouth daily.    Yes [provider]  Cholecalciferol (VITAMIN D PO) Take 1 tablet by mouth daily.    Yes [provider]  Cyanocobalamin (VITAMIN B 12 PO) Take 1 tablet by mouth daily.    Yes [provider]  fish oil-omega-3 fatty acids 1000 MG capsule Take 1 capsule by mouth daily.    Yes [provider]  gabapentin (NEURONTIN) 100 MG capsule Take 1 capsule (100 mg total) by mouth at bedtime. Toe pain 08/13/17  Yes Burchette, Alinda Sierras, MD  metoprolol tartrate (LOPRESSOR) 25 MG tablet TAKE ONE-HALF (1/2) TABLET TWICE A DAY 11/25/16  Yes Burchette, Alinda Sierras, MD  Multiple Vitamins-Minerals (MULTIVITAMIN WITH MINERALS) tablet Take 1 tablet by mouth daily.   Yes [provider]  pantoprazole (PROTONIX) 20 MG tablet TAKE 1 TABLET EVERY OTHER DAY Patient taking differently: Take 0.5 tablet (10 mg) by mouth every other day 11/25/16  Yes Burchette, Alinda Sierras, MD  polyethylene glycol (MIRALAX / GLYCOLAX) packet Take 17 g by  mouth daily as needed for moderate constipation.   Yes [provider]  VITAMIN E PO Take 1 tablet by mouth daily.    Yes [provider]  docusate sodium (COLACE) 100 MG capsule Take 1 capsule (100 mg total) by mouth 2 (two) times daily. Patient taking differently: Take 100 mg by mouth 2 (two) times daily as needed for mild constipation.  06/04/16   Eulogio Bear U, DO  glucose blood test strip Use once daily 12/01/14   Burchette, Alinda Sierras, MD  mirtazapine (REMERON) 7.5 MG tablet Take 1 tablet (7.5 mg total) by mouth at bedtime. Patient not taking: Reported on 08/16/2017 07/09/17   Eulas Post, MD  ONE TOUCH LANCETS MISC Use daily as directed 03/25/11   Eulas Post, MD    Current Facility-Administered Medications  Medication Dose Route Frequency Provider Last Rate Last Dose  .  acetaminophen (TYLENOL) tablet 650 mg  650 mg Oral Q6H PRN Johnson-Pitts, Endia, MD       Or  . acetaminophen (TYLENOL) suppository 650 mg  650 mg Rectal Q6H PRN Johnson-Pitts, Endia, MD      . alum & mag hydroxide-simeth (MAALOX/MYLANTA) 200-200-20 MG/5ML suspension 30 mL  30 mL Oral Q4H PRN Johnson-Pitts, Endia, MD   30 mL at 08/17/17 0058  . atorvastatin (LIPITOR) tablet 20 mg  20 mg Oral q1800 Johnson-Pitts, Endia, MD      . bisacodyl (DULCOLAX) suppository 10 mg  10 mg Rectal Daily PRN Johnson-Pitts, Endia, MD      . docusate sodium (COLACE) capsule 100 mg  100 mg Oral BID PRN Johnson-Pitts, Endia, MD      . feeding supplement (ENSURE ENLIVE) (ENSURE ENLIVE) liquid 237 mL  237 mL Oral BID BM Johnson-Pitts, Endia, MD   237 mL at 08/17/17 0848  . insulin aspart (novoLOG) injection 0-9 Units  0-9 Units Subcutaneous TID WC Johnson-Pitts, Endia, MD   1 Units at 08/17/17 0846  . metoprolol tartrate (LOPRESSOR) tablet 25 mg  25 mg Oral BID Johnson-Pitts, Endia, MD   25 mg at 08/17/17 0846  . ondansetron (ZOFRAN) tablet 4 mg  4 mg Oral Q6H PRN Johnson-Pitts, Endia, MD       Or  . ondansetron  (ZOFRAN) injection 4 mg  4 mg Intravenous Q6H PRN Johnson-Pitts, Endia, MD      . pantoprazole (PROTONIX) EC tablet 20 mg  20 mg Oral QODAY Johnson-Pitts, Endia, MD   20 mg at 08/17/17 0846  . polyethylene glycol (MIRALAX / GLYCOLAX) packet 17 g  17 g Oral Daily PRN Johnson-Pitts, Endia, MD      . zolpidem (AMBIEN) tablet 5 mg  5 mg Oral QHS PRN Jani Gravel, MD   5 mg at 08/17/17 0058    Allergies as of 08/16/2017 - Review Complete 08/16/2017  Allergen Reaction Noted  . Sulfamethoxazole-trimethoprim  08/12/2006  . Sulfonamide derivatives    . Nitrofurantoin  08/12/2006    Family History  Problem Relation Age of Onset  . Heart disease Mother 27  . Kidney disease Mother   . Heart attack Mother   . Congestive Heart Failure Father 61  . Colon cancer Brother   . Heart disease Brother   . Breast cancer Sister   . Uterine cancer Sister     Social History   Socioeconomic History  . Marital status: Widowed    Spouse name: Not on file  . Number of children: 3  . Years of education: 53  . Highest education level: Not on file  Occupational History    Employer: RETIRED  Social Needs  . Financial resource strain: Not on file  . Food insecurity:    Worry: Not on file    Inability: Not on file  . Transportation needs:    Medical: Not on file    Non-medical: Not on file  Tobacco Use  . Smoking status: Never Smoker  . Smokeless tobacco: Never Used  Substance and Sexual Activity  . Alcohol use: No    Alcohol/week: 0.0 oz  . Drug use: No  . Sexual activity: Not on file  Lifestyle  . Physical activity:    Days per week: Not on file    Minutes per session: Not on file  . Stress: Not on file  Relationships  . Social connections:    Talks on phone: Not on file    Gets together: Not on file  Attends religious service: Not on file    Active member of club or organization: Not on file    Attends meetings of clubs or organizations: Not on file    Relationship status: Not on file    . Intimate partner violence:    Fear of current or ex partner: Not on file    Emotionally abused: Not on file    Physically abused: Not on file    Forced sexual activity: Not on file  Other Topics Concern  . Not on file  Social History Narrative   Patient is a widow and lives alone.   Patient has three adult children.   Patient is retired.   Patient has a high school.   Patient is right-handed.   Patient does not drink any caffeine.    Review of Systems: Pertinent positive and negative review of systems were noted in the above HPI section.  All other review of systems was otherwise negative.  Physical Exam: Vital signs in last 24 hours: Temp:  [97.8 F (36.6 C)-98.4 F (36.9 C)] 98.4 F (36.9 C) (07/07 0551) Pulse Rate:  [73-85] 79 (07/07 0551) Resp:  [15-23] 16 (07/07 0551) BP: (120-166)/(56-76) 120/63 (07/07 0551) SpO2:  [91 %-100 %] 96 % (07/07 0551) Weight:  [110 lb 3.7 oz (50 kg)] 110 lb 3.7 oz (50 kg) (07/06 2037) Last BM Date: 08/15/17 General:   Alert,  Well-developed, well-nourished, elderly white female pleasant and cooperative in NAD Head:  Normocephalic and atraumatic. Eyes:  Sclera clear, no icterus.   Conjunctiva pink. Ears:  Normal auditory acuity. Nose:  No deformity, discharge,  or lesions. Mouth:  No deformity or lesions.   Neck:  Supple; no masses or thyromegaly. Lungs:  Clear throughout to auscultation.   No wheezes, crackles, or rhonchi. Heart:  Regular rate and rhythm; soft systolic murmurs, clicks, rubs,  or gallops. Abdomen:  Soft,tender right mid quadrant, no guarding or rebound, BS active,nonpalp mass or hsm.   Rectal:  Deferred  Msk:  Symmetrical without gross deformities. . Pulses:  Normal pulses noted. Extremities:  Without clubbing or edema. Neurologic:  Alert and  oriented x4;  grossly normal neurologically. Skin:  Intact without significant lesions or rashes.. Psych:  Alert and cooperative. Normal mood and affect.  Intake/Output from  previous day: 07/06 0701 - 07/07 0700 In: 440 [Blood:390; IV Piggyback:50] Out: -  Intake/Output this shift: No intake/output data recorded.  Lab Results: Recent Labs    08/16/17 1532 08/16/17 2339 08/17/17 0402 08/17/17 0812  WBC 10.9*  --   --   --   HGB 7.6* 8.9* 8.9* 8.9*  HCT 24.7* 28.0* 27.6* 28.2*  PLT 315  --   --   --    BMET Recent Labs    08/16/17 1532  NA 140  K 4.0  CL 102  CO2 28  GLUCOSE 152*  BUN 35*  CREATININE 0.88  CALCIUM 8.8*   LFT Recent Labs    08/16/17 1532  PROT 7.4  ALBUMIN 2.9*  AST 25  ALT 17  ALKPHOS 41  BILITOT 0.4   PT/INR Recent Labs    08/16/17 1532  LABPROT 13.8  INR 1.07       IMPRESSION:  #23 84 year old white female admitted with progressive weakness, and progressive anemia and found on CT imaging to have a large right colon mass consistent with colon cancer.  There is haziness of the surrounding pericolonic fat as central mesenteric and porta hepatis lymphadenopathy suspicious for nodal mets.  Patient has been having some mild right-sided abdominal discomfort, decrease in appetite and a gradual weight loss of 20 pounds.  She did have black stools about a month ago for 1 day but other than that has not noticed any melena or hematochezia.  #2 prior history of adenomatous colon polyps-last colonoscopy 2011 #3 adult onset diabetes mellitus #4.  Hypertension #5.  History of coronary artery disease   PLAN: Patient is not ready to commit to colonoscopy today.  She would like to wait for her sons to arrive from out of town to help her make decisions. She is not sure that she wants to go through colonoscopy or that she would go through surgery, or chemotherapy.   [name]  08/17/2017, 10:32 AM  ________________________________________________________________________  Velora Heckler GI MD note:  I personally examined the patient, reviewed the data and agree with the assessment and plan described above.  Very likely  colon cancer based on imaging. She understands that the next step is proving the diagnosis with a colonoscopy. She is going to consider having one after talking with her family today.     Owens Loffler, MD [facility] Pager [phone]

## 2017-08-17 NOTE — Progress Notes (Signed)
PROGRESS NOTE    [name]  ZYS:[id number] DOB: [birth year] DOA: 08/16/2017 PCP: Eulas Post, MD    Brief Narrative:  84 y.o. female with medical history significant of coronary artery disease, paroxysmal A. fib ,diet-controlled type 2 diabetes presents with severe fatigue and weakness over the past several months.  Her symptoms  have become markedly more noticeable over the past few weeks.  She has been to see her PCP numerous times for this.  She has recently noticed that she was unable to even do her ADLs without significant weakness and having to stop numerous times.  Patient lives in the area alone.  She only uses a cane as needed.  was very high functioning until  the past few months when her weakness has limited her.  She saw her PCP this past week she was found to have a hemoglobin around 7.22.  That was down from about 10 in April of this year.  Admitted  to dark black stool once before but not again. She has had Intermittent right lower quadrant abdominal pain.  Denies any nausea vomiting.  She is lost 20 pounds over the past several months.  She states in January for about 3 months she has severe diarrhea.  She did see a GI doctor in the area but she is unsure of the groups name.  Patient denies any chest pain but has had dyspnea on exertion.  Does take Plavix and aspirin.  She does have a remote history of TIA.  Patient was found to be anemic here. She was heme Positive.  Also found to have a large colonic mass on abdominal CT.  sHe has received 1 unit of packed red blood cells in the ED.  ED called GI Dr. Ardis Hughs who will see patient in the morning for consultation.   ED Course: CT of the abdomen and pelvis with contrast showed a large annular colonic mass involving the entire descending colon highly suspicious for primary colonic malignancy.  Was also central mesenteric and porta hepatis lymphadenopathy suspicious for nodal metastatic disease.  Mild intrahepatic and extrahepatic  biliary ductal dilatation.  There was also severe air to vertebral compression fracture of unknown chronicity  Assessment & Plan:   Principal Problem:   Blood loss anemia Active Problems:   Type 2 diabetes mellitus, controlled (HCC)   Hyperlipidemia   CAD (coronary artery disease)   PAF (paroxysmal atrial fibrillation) (HCC)   Carotid artery disease (HCC)   Chronic diastolic (congestive) heart failure (HCC)   Colonic mass- seen on abd CT   Fatigue associated with anemia   History of TIA (transient ischemic attack)  Acute Blood loss anemia -Likely secondary to colonic mass as seen on abd CT -Patient given one unit PRBC with appropriate correction -Repeat CBC in AM -Patient seen by GI. Patient to discuss with family first before deciding on wanting to pursue colonoscopy or not  Type 2 diabetes mellitus, diet controlled ([place]) -Will continue on SSI coverage as needed -Stable at present  Hyperlipidemia -Continue on statin as tolerated  CAD (coronary artery disease) -Patient is continued on Plavix, follows  with Dr.NAHSER -No chest pain. Stable at present  PAF (paroxysmal atrial fibrillation) ([place])  -Patient is continued on daily aspirin, NOT Coumadin candidate due to history of hemorrhoidal bleeding -Rate controlled at present  Fatigue -Likely secondary to presenting anemia -Will request PT eval  History of TIA (transient ischemic attack) -Stable neurologically at present  Chronic Diastolic dysfunction -echo February 2017 showed grade  1 diastolic dysfunction EF 55 to 60% -Appears to be euvolemic at present  DVT prophylaxis: SCD's Code Status: DNR Family Communication: Pt in room, family not at bedside Disposition Plan: Uncertain at this time  Consultants:   GI  Procedures:     Antimicrobials: Anti-infectives (From admission, onward)   None       Subjective: Without complaints at this time  Objective: Vitals:   08/16/17 2052 08/17/17 0008 08/17/17  0551 08/17/17 1404  BP: (!) 163/73 (!) 120/56 120/63 138/82  Pulse: 85 73 79 75  Resp: 16 16 16 16   Temp: 98.1 F (36.7 C) 98.1 F (36.7 C) 98.4 F (36.9 C) 97.7 F (36.5 C)  TempSrc: Oral Oral Oral Oral  SpO2: 94% 99% 96% 98%  Weight:      Height:        Intake/Output Summary (Last 24 hours) at 08/17/2017 1407 Last data filed at 08/16/2017 2045 Gross per 24 hour  Intake 440 ml  Output -  Net 440 ml   Filed Weights   08/16/17 2037  Weight: 50 kg (110 lb 3.7 oz)    Examination:  General exam: Appears calm and comfortable  Respiratory system: Clear to auscultation. Respiratory effort normal. Cardiovascular system: S1 & S2 heard, RRR Gastrointestinal system: Abdomen is nondistended, soft and nontender. No organomegaly or masses felt. Normal bowel sounds heard. Central nervous system: Alert and oriented. No focal neurological deficits. Extremities: Symmetric 5 x 5 power. Skin: No rashes, lesions  Psychiatry: Judgement and insight appear normal. Mood & affect appropriate.   Data Reviewed: I have personally reviewed following labs and imaging studies  CBC: Recent Labs  Lab 08/13/17 1546 08/15/17 1507 08/16/17 1532 08/16/17 2339 08/17/17 0402 08/17/17 0812  WBC 10.9*  --  10.9*  --   --   --   NEUTROABS 7,488  --  7.8*  --   --   --   HGB 7.5* 7.6* 7.6* 8.9* 8.9* 8.9*  HCT 24.2*  --  24.7* 28.0* 27.6* 28.2*  MCV 87.4  --  90.5  --   --   --   PLT 311  --  315  --   --   --    Basic Metabolic Panel: Recent Labs  Lab 08/13/17 1546 08/16/17 1532  NA 138 140  K 4.8 4.0  CL 102 102  CO2 27 28  GLUCOSE 157* 152*  BUN 33* 35*  CREATININE 0.90* 0.88  CALCIUM 8.8 8.8*   GFR: Estimated Creatinine Clearance: 30.2 mL/min (by C-G formula based on SCr of 0.88 mg/dL). Liver Function Tests: Recent Labs  Lab 08/16/17 1532  AST 25  ALT 17  ALKPHOS 41  BILITOT 0.4  PROT 7.4  ALBUMIN 2.9*   No results for input(s): LIPASE, AMYLASE in the last 168 hours. No results  for input(s): AMMONIA in the last 168 hours. Coagulation Profile: Recent Labs  Lab 08/16/17 1532  INR 1.07   Cardiac Enzymes: No results for input(s): CKTOTAL, CKMB, CKMBINDEX, TROPONINI in the last 168 hours. BNP (last 3 results) No results for input(s): PROBNP in the last 8760 hours. HbA1C: Recent Labs    08/17/17 0402  HGBA1C 6.4*   CBG: Recent Labs  Lab 08/16/17 2201 08/17/17 0751 08/17/17 1153  GLUCAP 159* 125* 294*   Lipid Profile: No results for input(s): CHOL, HDL, LDLCALC, TRIG, CHOLHDL, LDLDIRECT in the last 72 hours. Thyroid Function Tests: No results for input(s): TSH, T4TOTAL, FREET4, T3FREE, THYROIDAB in the last 72 hours. Anemia Panel:  Recent Labs    08/16/17 1519  VITAMINB12 593  FOLATE 21.7  FERRITIN 19  TIBC 337  IRON 29  RETICCTPCT 2.8   Sepsis Labs: No results for input(s): PROCALCITON, LATICACIDVEN in the last 168 hours.  No results found for this or any previous visit (from the past 240 hour(s)).   Radiology Studies: Ct Abdomen Pelvis W Contrast  Result Date: 08/16/2017 CLINICAL DATA:  Anemia. GI bleed. Heme-positive stools. Intermittent right lower quadrant pain. EXAM: CT ABDOMEN AND PELVIS WITH CONTRAST TECHNIQUE: Multidetector CT imaging of the abdomen and pelvis was performed using the standard protocol following bolus administration of intravenous contrast. CONTRAST:  11mL ISOVUE-300 IOPAMIDOL (ISOVUE-300) INJECTION 61% COMPARISON:  09/05/2016 CT pelvis. FINDINGS: Lower chest: Nonspecific patchy subpleural reticulation and ground-glass attenuation at both lung bases, not definitely changed since 03/16/2015. Coronary atherosclerosis. Hepatobiliary: Normal liver size. No liver mass. Mild diffuse intrahepatic biliary ductal dilatation. Dilated common bile duct (10 mm diameter). Faintly calcified gallstones in the nondistended gallbladder with no definite gallbladder wall thickening or pericholecystic fluid. Pancreas: Normal, with no mass or duct  dilation. Spleen: Normal size. No mass. Adrenals/Urinary Tract: No discrete adrenal nodules. Normal kidneys with no hydronephrosis and no renal mass. Normal bladder. Stomach/Bowel: Normal non-distended stomach. Normal caliber small bowel with no small bowel wall thickening. Normal appendix. There is a large annular colonic mass involving the entire ascending colon measuring 4.7 x 3.5 x 7.5 cm (series 2/image 36). There is associated haziness of the pericolonic fat in the ascending colon. Marked sigmoid diverticulosis, with no sigmoid wall thickening or pericolonic fat stranding. Vascular/Lymphatic: Atherosclerotic nonaneurysmal abdominal aorta. Patent portal, splenic, hepatic and renal veins. A few mildly prominent rounded nodes in lateral right ascending colonic mesentery measuring up to 7 mm (series 2/image 32). Conglomerate central mesenteric adenopathy up to 2.1 cm (series 2/image 37). Enlarged 1.7 cm porta hepatis node (series 2/image 19). No pelvic adenopathy. Reproductive: Grossly normal uterus.  No adnexal mass. Other: No pneumoperitoneum, ascites or focal fluid collection. Musculoskeletal: No aggressive appearing focal osseous lesions. Severe T2 vertebral compression fracture of uncertain chronicity with 8 mm bony retropulsion into the anterior lumbar spinal canal. Moderate lumbar spondylosis. IMPRESSION: 1. Large annular colonic mass involving the entire ascending colon, highly suspicious for primary colonic malignancy. 2. Nonspecific pericolonic fat haziness associated with the ascending colonic mass, which could be due to tumor filtration or could be inflammatory due to wall perforation. No free air or abscess. No bowel obstruction. 3. Central mesenteric and porta hepatis lymphadenopathy suspicious for nodal metastatic disease. 4. Cholelithiasis. No evidence of acute cholecystitis. Mild intrahepatic and extrahepatic biliary ductal dilatation with CBD diameter 10 mm. Recommend correlation with serum  bilirubin levels. MRCP may be obtained to evaluate for choledocholithiasis, as clinically warranted. 5. Severe L2 vertebral compression fracture of uncertain chronicity. 6. Chronic findings include: Aortic Atherosclerosis (ICD10-I70.0). Coronary atherosclerosis. Marked sigmoid diverticulosis. Electronically Signed   By: Ilona Sorrel M.D.   On: 08/16/2017 17:23    Scheduled Meds: . atorvastatin  20 mg Oral q1800  . feeding supplement (ENSURE ENLIVE)  237 mL Oral BID BM  . insulin aspart  0-9 Units Subcutaneous TID WC  . metoprolol tartrate  25 mg Oral BID  . pantoprazole  20 mg Oral QODAY   Continuous Infusions:   LOS: 1 day   Marylu Lund, MD [facility] Pager [phone]  If 7PM-7AM, please contact night-coverage [email or web address] Password [email or web address] 08/17/2017, 2:07 PM

## 2017-08-18 DIAGNOSIS — I6521 Occlusion and stenosis of right carotid artery: Secondary | ICD-10-CM

## 2017-08-18 DIAGNOSIS — K922 Gastrointestinal hemorrhage, unspecified: Secondary | ICD-10-CM

## 2017-08-18 DIAGNOSIS — Z7189 Other specified counseling: Secondary | ICD-10-CM

## 2017-08-18 DIAGNOSIS — Z515 Encounter for palliative care: Secondary | ICD-10-CM

## 2017-08-18 DIAGNOSIS — Z86010 Personal history of colonic polyps: Secondary | ICD-10-CM

## 2017-08-18 LAB — CBC
HCT: 28.1 % — ABNORMAL LOW (ref 36.0–46.0)
Hemoglobin: 8.7 g/dL — ABNORMAL LOW (ref 12.0–15.0)
MCH: 27.8 pg (ref 26.0–34.0)
MCHC: 31 g/dL (ref 30.0–36.0)
MCV: 89.8 fL (ref 78.0–100.0)
Platelets: 282 10*3/uL (ref 150–400)
RBC: 3.13 MIL/uL — ABNORMAL LOW (ref 3.87–5.11)
RDW: 15 % (ref 11.5–15.5)
WBC: 10.2 10*3/uL (ref 4.0–10.5)

## 2017-08-18 LAB — GLUCOSE, CAPILLARY
GLUCOSE-CAPILLARY: 130 mg/dL — AB (ref 70–99)
GLUCOSE-CAPILLARY: 150 mg/dL — AB (ref 70–99)
Glucose-Capillary: 176 mg/dL — ABNORMAL HIGH (ref 70–99)
Glucose-Capillary: 201 mg/dL — ABNORMAL HIGH (ref 70–99)

## 2017-08-18 LAB — HEMOGLOBIN AND HEMATOCRIT, BLOOD
HCT: 30.4 % — ABNORMAL LOW (ref 36.0–46.0)
HEMATOCRIT: 30.1 % — AB (ref 36.0–46.0)
HEMOGLOBIN: 9.3 g/dL — AB (ref 12.0–15.0)
Hemoglobin: 9.3 g/dL — ABNORMAL LOW (ref 12.0–15.0)

## 2017-08-18 MED ORDER — ENSURE ENLIVE PO LIQD: 237.0000 mL | Freq: Two times a day (BID) | ORAL | Status: DC

## 2017-08-18 MED ORDER — ENSURE ENLIVE PO LIQD
237.0000 mL | Freq: Three times a day (TID) | ORAL | Status: DC
  Administered 2017-08-18: 237 mL via ORAL

## 2017-08-18 NOTE — Telephone Encounter (Signed)
Left message on machine for son [name] to return our call.  CRM created.  [name] [facility]

## 2017-08-18 NOTE — Progress Notes (Addendum)
Progress Note   Subjective  Chief Complaint: Abnormal CT of the abdomen  This morning found laying comfortably in bed, tells me that she feels quite well.  She still has not made a decision regarding colonoscopy, she is leaning towards having this done but her family is arriving later this afternoon.  Her son just flew in and landed about 10 minutes ago and is planning on coming straight to the hospital she tells me he will be here in 3 hours.  She would like to await his arrival at least to discuss with him.   Objective   Vital signs in last 24 hours: Temp:  [97.7 F (36.5 C)-98.9 F (37.2 C)] 98.2 F (36.8 C) (07/08 0505) Pulse Rate:  [74-81] 74 (07/08 0505) Resp:  [16-18] 18 (07/08 0505) BP: (119-138)/(59-82) 119/59 (07/08 0505) SpO2:  [98 %] 98 % (07/08 0505) Weight:  [110 lb 10.7 oz (50.2 kg)] 110 lb 10.7 oz (50.2 kg) (07/08 0505) Last BM Date: 08/15/17(per pt) General:    Elderly white female in NAD Heart:  Regular rate and rhythm; no murmurs Lungs: Respirations even and unlabored, lungs CTA bilaterally Abdomen:  Soft, nontender and nondistended. Normal bowel sounds. Extremities:  Without edema. Neurologic:  Alert and oriented,  grossly normal neurologically. Psych:  Cooperative. Normal mood and affect.  Lab Results: Recent Labs    08/16/17 1532  08/17/17 1838 08/18/17 0455 08/18/17 0751  WBC 10.9*  --   --  10.2  --   HGB 7.6*   < > 9.2* 8.7* 9.3*  HCT 24.7*   < > 29.3* 28.1* 30.1*  PLT 315  --   --  282  --    < > = values in this interval not displayed.   BMET Recent Labs    08/16/17 1532  NA 140  K 4.0  CL 102  CO2 28  GLUCOSE 152*  BUN 35*  CREATININE 0.88  CALCIUM 8.8*   LFT Recent Labs    08/16/17 1532  PROT 7.4  ALBUMIN 2.9*  AST 25  ALT 17  ALKPHOS 41  BILITOT 0.4   PT/INR Recent Labs    08/16/17 1532  LABPROT 13.8  INR 1.07    Studies/Results: Ct Abdomen Pelvis W Contrast  Result Date: 08/16/2017 CLINICAL DATA:  Anemia.  GI bleed. Heme-positive stools. Intermittent right lower quadrant pain. EXAM: CT ABDOMEN AND PELVIS WITH CONTRAST TECHNIQUE: Multidetector CT imaging of the abdomen and pelvis was performed using the standard protocol following bolus administration of intravenous contrast. CONTRAST:  158mL ISOVUE-300 IOPAMIDOL (ISOVUE-300) INJECTION 61% COMPARISON:  09/05/2016 CT pelvis. FINDINGS: Lower chest: Nonspecific patchy subpleural reticulation and ground-glass attenuation at both lung bases, not definitely changed since 03/16/2015. Coronary atherosclerosis. Hepatobiliary: Normal liver size. No liver mass. Mild diffuse intrahepatic biliary ductal dilatation. Dilated common bile duct (10 mm diameter). Faintly calcified gallstones in the nondistended gallbladder with no definite gallbladder wall thickening or pericholecystic fluid. Pancreas: Normal, with no mass or duct dilation. Spleen: Normal size. No mass. Adrenals/Urinary Tract: No discrete adrenal nodules. Normal kidneys with no hydronephrosis and no renal mass. Normal bladder. Stomach/Bowel: Normal non-distended stomach. Normal caliber small bowel with no small bowel wall thickening. Normal appendix. There is a large annular colonic mass involving the entire ascending colon measuring 4.7 x 3.5 x 7.5 cm (series 2/image 36). There is associated haziness of the pericolonic fat in the ascending colon. Marked sigmoid diverticulosis, with no sigmoid wall thickening or pericolonic fat stranding. Vascular/Lymphatic: Atherosclerotic nonaneurysmal abdominal  aorta. Patent portal, splenic, hepatic and renal veins. A few mildly prominent rounded nodes in lateral right ascending colonic mesentery measuring up to 7 mm (series 2/image 32). Conglomerate central mesenteric adenopathy up to 2.1 cm (series 2/image 37). Enlarged 1.7 cm porta hepatis node (series 2/image 19). No pelvic adenopathy. Reproductive: Grossly normal uterus.  No adnexal mass. Other: No pneumoperitoneum, ascites or  focal fluid collection. Musculoskeletal: No aggressive appearing focal osseous lesions. Severe T2 vertebral compression fracture of uncertain chronicity with 8 mm bony retropulsion into the anterior lumbar spinal canal. Moderate lumbar spondylosis. IMPRESSION: 1. Large annular colonic mass involving the entire ascending colon, highly suspicious for primary colonic malignancy. 2. Nonspecific pericolonic fat haziness associated with the ascending colonic mass, which could be due to tumor filtration or could be inflammatory due to wall perforation. No free air or abscess. No bowel obstruction. 3. Central mesenteric and porta hepatis lymphadenopathy suspicious for nodal metastatic disease. 4. Cholelithiasis. No evidence of acute cholecystitis. Mild intrahepatic and extrahepatic biliary ductal dilatation with CBD diameter 10 mm. Recommend correlation with serum bilirubin levels. MRCP may be obtained to evaluate for choledocholithiasis, as clinically warranted. 5. Severe L2 vertebral compression fracture of uncertain chronicity. 6. Chronic findings include: Aortic Atherosclerosis (ICD10-I70.0). Coronary atherosclerosis. Marked sigmoid diverticulosis. Electronically Signed   By: Ilona Sorrel M.D.   On: 08/16/2017 17:23    Assessment / Plan:   Assessment: 1.  Abnormal CT of the abdomen: Showing a large right colon mass consistent with colon cancer, haziness of the surrounding pericolonic fat central mesenteric and porta hepatis lymphadenopathy suspicious for nodal mets, history of right-sided abdominal discomfort decrease in appetite and gradual weight loss of 20 pounds 2.  Progressive weakness and anemia 3.  Prior history of adenomatous colon polyps: Last colonoscopy 2011  4.  History of CAD  Plan: 1.  Again patient would like to await her son's arrival in order to make a final decision regarding colonoscopy.  He is set to arrive around 1/2:00pm this afternoon. We did discuss this at length this morning.  I  reviewed the procedure itself and what may need to happen afterwards.  She verbalized understanding. 2.  Please await any further recommendations from Dr. Silverio Decamp later today  Thank you for kind consultation, we will continue to follow along.    LOS: 2 days   Levin Erp  08/18/2017, 9:42 AM  Pager # [phone]   Attending physician's note   I have taken an interval history, reviewed the chart and examined the patient. I agree with the Advanced Practitioner's note, impression and recommendations.  Discussed colonoscopy procedure and associated benefits and risks. Patient is unsure how she wants to proceed, she is waiting for her 2 son's to arrive.  She thinks they will be here later this evening.   Damaris Hippo , MD [phone]

## 2017-08-18 NOTE — Telephone Encounter (Signed)
Patient's son [name] returned call for lab results, I advised that the results from 08/13/17 were already given to the patient, he said he spoke with Dr. Elease Hashimoto on Friday and he is pretty much aware of what's going on.

## 2017-08-18 NOTE — Progress Notes (Signed)
Initial Nutrition Assessment  DOCUMENTATION CODES:   Underweight, Severe malnutrition in context of chronic illness  INTERVENTION:    Ensure Enlive po TID, each supplement provides 350 kcal and 20 grams of protein  Liberalize Diet to regular  NUTRITION DIAGNOSIS:   Severe Malnutrition related to chronic illness(colonic mass, diarrhea x 3 months) as evidenced by energy intake < or equal to 75% for > or equal to 1 month, percent weight loss, severe fat depletion, severe muscle depletion.  GOAL:   Patient will meet greater than or equal to 90% of their needs  MONITOR:   PO intake, Supplement acceptance, Weight trends, Labs  REASON FOR ASSESSMENT:   Consult Assessment of nutrition requirement/status  ASSESSMENT:   Patient with PMH significant for CAD, DM, HLD, PAF, and TIA. Presents this admission with complaints of fatigue and weakness over the past few weeks. Admitted for acute blood loss anemia likely secondary colonic mass as seen on CT scan. Awaiting pt's decision to proceed with colonoscopy.    Pt reports she has seen a decrease in intake over the last three months due to ongoing diarrhea. States she typically will eat a small breakfast and lunch with 1-2 Glucernas in between meals. Pt previously on Heart Healthy diet which has since been liberalized to regular. She tolerated soup last night without any n/v. She has Glucernas ordered but does not like vanilla flavoring. Discussed the importance of high calorie high protein intake for preservation of lean body mass given her recent wt loss. Will provide pt with Ensure Enlive to maximize calories and protein.   Pt endorses a UBW of 128 lb and a recent wt loss of 20 lb over the last three months. Records indicate pt weighed 125 lb 02/19/17 and 110 this admission (12% wt in 7 months, significant for time frame). Nutrition-Focused physical exam completed.   Medications reviewed.  Labs reviewed: CBG 101-294  NUTRITION - FOCUSED  PHYSICAL EXAM:    Most Recent Value  Orbital Region  Moderate depletion  Upper Arm Region  Severe depletion  Thoracic and Lumbar Region  Moderate depletion  Buccal Region  Severe depletion  Temple Region  Severe depletion  Clavicle Bone Region  Severe depletion  Clavicle and Acromion Bone Region  Severe depletion  Scapular Bone Region  Moderate depletion  Dorsal Hand  Moderate depletion  Patellar Region  Severe depletion  Anterior Thigh Region  Severe depletion  Posterior Calf Region  Severe depletion  Edema (RD Assessment)  None  Hair  Reviewed  Eyes  Reviewed  Mouth  Reviewed  Skin  Reviewed  Nails  Reviewed     Diet Order:   Diet Order           Diet regular Room service appropriate? Yes; Fluid consistency: Thin  Diet effective now          EDUCATION NEEDS:   Education needs have been addressed  Skin:  Skin Assessment: Reviewed RN Assessment  Last BM:  08/15/17  Height:   Ht Readings from Last 1 Encounters:  08/16/17 5' 6.5" (1.689 m)    Weight:   Wt Readings from Last 1 Encounters:  08/18/17 110 lb 10.7 oz (50.2 kg)    Ideal Body Weight:  59.1 kg  BMI:  Body mass index is 17.6 kg/m.  Estimated Nutritional Needs:   Kcal:  1500-1700 kcal  Protein:  75-85 g  Fluid:  >1.5 L/day    Mariana Single RD, LDN Clinical Nutrition Pager # [phone]

## 2017-08-18 NOTE — Evaluation (Signed)
Physical Therapy Evaluation Patient Details Name: [name] MRN: [id number] DOB: Aug 12, 1920 Today's Date: 08/18/2017   History of Present Illness  [name] is a 84 y.o. female with medical history significant of coronary artery disease, paroxysmal A. fib ,diet-controlled type 2 diabetes presents with severe fatigue and weakness over the past several months.  Her symptoms  have become markedly more noticeable over the past few weeks; pt found to be anemic with large colon mass  Clinical Impression  Pt admitted with above diagnosis. Pt currently with functional limitations due to the deficits listed below (see PT Problem List). Pt is very independent at her baseline, will benefit from PT in acute setting, recommend HHPT pending progress and home support, may need SNF; Pt will benefit from skilled PT to increase their independence and safety with mobility to allow discharge to the venue listed below.       Follow Up Recommendations Home health PT(vs SNF pending progress)    Equipment Recommendations  None recommended by PT    Recommendations for Other Services       Precautions / Restrictions Precautions Precautions: Fall Restrictions Weight Bearing Restrictions: No      Mobility  Bed Mobility Overal bed mobility: Needs Assistance Bed Mobility: Supine to Sit     Supine to sit: Supervision     General bed mobility comments: for safety  Transfers Overall transfer level: Needs assistance Equipment used: None Transfers: Sit to/from Stand Sit to Stand: Min guard;Min assist         General transfer comment: for safety and to steady upon rising  Ambulation/Gait Ambulation/Gait assistance: Min assist Gait Distance (Feet): 50 Feet Assistive device: 1 person hand held assist Gait Pattern/deviations: Step-through pattern;Narrow base of support;Drifts right/left;Decreased stride length     General Gait Details: unsteady gait, min assist to balance with slight  drifting noted, fatigues quickly (declines amb with RW)  Stairs            Wheelchair Mobility    Modified Rankin (Stroke Patients Only)       Balance Overall balance assessment: Needs assistance(denies falls) Sitting-balance support: Feet supported;No upper extremity supported Sitting balance-Leahy Scale: Good       Standing balance-Leahy Scale: Fair                               Pertinent Vitals/Pain Pain Assessment: No/denies pain    Home Living Family/patient expects to be discharged to:: Private residence Living Arrangements: Alone Available Help at Discharge: Friend(s);Available PRN/intermittently Type of Home: House Home Access: Level entry;Other (comment)(has stair lift at garage steps)     Home Layout: Able to live on main level with bedroom/bathroom;Multi-level Home Equipment: Walker - 2 wheels;Cane - single point      Prior Function Level of Independence: Independent         Comments: drives, uses cane occasionally     Hand Dominance        Extremity/Trunk Assessment   Upper Extremity Assessment Upper Extremity Assessment: Generalized weakness    Lower Extremity Assessment Lower Extremity Assessment: Generalized weakness       Communication   Communication: No difficulties  Cognition Arousal/Alertness: Awake/alert Behavior During Therapy: WFL for tasks assessed/performed Overall Cognitive Status: Within Functional Limits for tasks assessed  General Comments      Exercises     Assessment/Plan    PT Assessment Patient needs continued PT services  PT Problem List Decreased balance;Decreased activity tolerance;Decreased knowledge of use of DME;Decreased mobility;Decreased strength       PT Treatment Interventions DME instruction;Gait training;Functional mobility training;Therapeutic activities;Therapeutic exercise;Patient/family education;Balance training     PT Goals (Current goals can be found in the Care Plan section)  Acute Rehab PT Goals Patient Stated Goal: to get stronger PT Goal Formulation: With patient Time For Goal Achievement: 09/01/17 Potential to Achieve Goals: Good    Frequency Min 3X/week   Barriers to discharge        Co-evaluation               AM-PAC PT "6 Clicks" Daily Activity  Outcome Measure Difficulty turning over in bed (including adjusting bedclothes, sheets and blankets)?: A Little Difficulty moving from lying on back to sitting on the side of the bed? : A Little Difficulty sitting down on and standing up from a chair with arms (e.g., wheelchair, bedside commode, etc,.)?: Unable Help needed moving to and from a bed to chair (including a wheelchair)?: A Little Help needed walking in hospital room?: A Little Help needed climbing 3-5 steps with a railing? : A Little 6 Click Score: 16    End of Session Equipment Utilized During Treatment: Gait belt Activity Tolerance: Patient tolerated treatment well;Patient limited by fatigue Patient left: in chair;with call bell/phone within reach;with chair alarm set   PT Visit Diagnosis: Unsteadiness on feet (R26.81);Difficulty in walking, not elsewhere classified (R26.2)    Time: 6378-5885 PT Time Calculation (min) (ACUTE ONLY): 14 min   Charges:   PT Evaluation $PT Eval Low Complexity: 1 Low PT Treatments $Gait Training: 8-22 mins   PT G CodesKenyon Ana, PT Pager: [phone] 08/18/2017   Kenyon Ana 08/18/2017, 1:05 PM

## 2017-08-18 NOTE — Progress Notes (Signed)
PROGRESS NOTE    [name]  DTO:[id number] DOB: 08-Sep-1920 DOA: 08/16/2017 PCP: Eulas Post, MD    Brief Narrative:  84 y.o. female with medical history significant of coronary artery disease, paroxysmal A. fib ,diet-controlled type 2 diabetes presents with severe fatigue and weakness over the past several months.  Her symptoms  have become markedly more noticeable over the past few weeks.  She has been to see her PCP numerous times for this.  She has recently noticed that she was unable to even do her ADLs without significant weakness and having to stop numerous times.  Patient lives in the area alone.  She only uses a cane as needed.  was very high functioning until  the past few months when her weakness has limited her.  She saw her PCP this past week she was found to have a hemoglobin around 7.22.  That was down from about 10 in April of this year.  Admitted  to dark black stool once before but not again. She has had Intermittent right lower quadrant abdominal pain.  Denies any nausea vomiting.  She is lost 20 pounds over the past several months.  She states in January for about 3 months she has severe diarrhea.  She did see a GI doctor in the area but she is unsure of the groups name.  Patient denies any chest pain but has had dyspnea on exertion.  Does take Plavix and aspirin.  She does have a remote history of TIA.  Patient was found to be anemic here. She was heme Positive.  Also found to have a large colonic mass on abdominal CT.  sHe has received 1 unit of packed red blood cells in the ED.  ED called GI Dr. Ardis Hughs who will see patient in the morning for consultation.   ED Course: CT of the abdomen and pelvis with contrast showed a large annular colonic mass involving the entire descending colon highly suspicious for primary colonic malignancy.  Was also central mesenteric and porta hepatis lymphadenopathy suspicious for nodal metastatic disease.  Mild intrahepatic and extrahepatic  biliary ductal dilatation.  There was also severe air to vertebral compression fracture of unknown chronicity  Assessment & Plan:   Principal Problem:   Blood loss anemia Active Problems:   Type 2 diabetes mellitus, controlled (HCC)   Hyperlipidemia   CAD (coronary artery disease)   PAF (paroxysmal atrial fibrillation) (HCC)   Carotid artery disease (HCC)   Chronic diastolic (congestive) heart failure (HCC)   Colonic mass- seen on abd CT   Fatigue associated with anemia   History of TIA (transient ischemic attack)  Acute Blood loss anemia -Likely secondary to colonic mass as seen on abd CT -Patient given one unit PRBC with appropriate correction -CBC remains stable -GI following. Awaiting patient's decision on whether or not to pursue colon -Palliative Care was consulted with meeting planned for later in the evening  Type 2 diabetes mellitus, diet controlled ([place]) -Will continue on SSI coverage as needed -Remains stable at present  Hyperlipidemia -Will continue with statin as tolerated  CAD (coronary artery disease) -Patient is continued on Plavix, follows  with Dr.NAHSER -Denies chest pains  PAF (paroxysmal atrial fibrillation) ([place])  -Patient is continued on daily aspirin, NOT Coumadin candidate due to history of hemorrhoidal bleeding -Remains rate controlled at present  Fatigue -Likely secondary to presenting anemia -Physical therapy consulted. Recommendation for home health PT  History of TIA (transient ischemic attack) -Stable neurologically currently  Chronic  Diastolic dysfunction -echo February 2017 showed grade 1 diastolic dysfunction EF 55 to 60% -Remains euvolemic this AM  DVT prophylaxis: SCD's Code Status: DNR Family Communication: Pt in room, family at bedside Disposition Plan: Uncertain at this time  Consultants:   GI  Palliative care  Procedures:     Antimicrobials: Anti-infectives (From admission, onward)   None       Subjective: Wanting to have regular diet and not heart healthy diet  Objective: Vitals:   08/17/17 2120 08/18/17 0505 08/18/17 1300 08/18/17 1343  BP: 124/64 (!) 119/59  (!) 144/61  Pulse: 81 74  79  Resp: 18 18  16   Temp: 98.9 F (37.2 C) 98.2 F (36.8 C) 97.8 F (36.6 C)   TempSrc: Oral Oral Oral   SpO2: 98% 98%  99%  Weight:  50.2 kg (110 lb 10.7 oz)    Height:       No intake or output data in the 24 hours ending 08/18/17 1628 Filed Weights   08/16/17 2037 08/18/17 0505  Weight: 50 kg (110 lb 3.7 oz) 50.2 kg (110 lb 10.7 oz)    Examination: General exam: Conversant, in no acute distress Respiratory system: normal chest rise, clear, no audible wheezing Cardiovascular system: regular rhythm, s1-s2 Gastrointestinal system: Nondistended, nontender, pos BS Central nervous system: No seizures, no tremors Extremities: No cyanosis, no joint deformities Skin: No rashes, no pallor Psychiatry: Affect normal // no auditory hallucinations   Data Reviewed: I have personally reviewed following labs and imaging studies  CBC: Recent Labs  Lab 08/13/17 1546  08/16/17 1532  08/17/17 1337 08/17/17 1838 08/18/17 0455 08/18/17 0751 08/18/17 1239  WBC 10.9*  --  10.9*  --   --   --  10.2  --   --   NEUTROABS 7,488  --  7.8*  --   --   --   --   --   --   HGB 7.5*   < > 7.6*   < > 9.3* 9.2* 8.7* 9.3* 9.3*  HCT 24.2*  --  24.7*   < > 29.4* 29.3* 28.1* 30.1* 30.4*  MCV 87.4  --  90.5  --   --   --  89.8  --   --   PLT 311  --  315  --   --   --  282  --   --    < > = values in this interval not displayed.   Basic Metabolic Panel: Recent Labs  Lab 08/13/17 1546 08/16/17 1532  NA 138 140  K 4.8 4.0  CL 102 102  CO2 27 28  GLUCOSE 157* 152*  BUN 33* 35*  CREATININE 0.90* 0.88  CALCIUM 8.8 8.8*   GFR: Estimated Creatinine Clearance: 30.3 mL/min (by C-G formula based on SCr of 0.88 mg/dL). Liver Function Tests: Recent Labs  Lab 08/16/17 1532  AST 25  ALT 17   ALKPHOS 41  BILITOT 0.4  PROT 7.4  ALBUMIN 2.9*   No results for input(s): LIPASE, AMYLASE in the last 168 hours. No results for input(s): AMMONIA in the last 168 hours. Coagulation Profile: Recent Labs  Lab 08/16/17 1532  INR 1.07   Cardiac Enzymes: No results for input(s): CKTOTAL, CKMB, CKMBINDEX, TROPONINI in the last 168 hours. BNP (last 3 results) No results for input(s): PROBNP in the last 8760 hours. HbA1C: Recent Labs    08/17/17 0402  HGBA1C 6.4*   CBG: Recent Labs  Lab 08/17/17 1739 08/17/17 2118 08/18/17 0739 08/18/17  1201 08/18/17 1609  GLUCAP 101* 168* 130* 176* 201*   Lipid Profile: No results for input(s): CHOL, HDL, LDLCALC, TRIG, CHOLHDL, LDLDIRECT in the last 72 hours. Thyroid Function Tests: No results for input(s): TSH, T4TOTAL, FREET4, T3FREE, THYROIDAB in the last 72 hours. Anemia Panel: Recent Labs    08/16/17 1519  VITAMINB12 593  FOLATE 21.7  FERRITIN 19  TIBC 337  IRON 29  RETICCTPCT 2.8   Sepsis Labs: No results for input(s): PROCALCITON, LATICACIDVEN in the last 168 hours.  No results found for this or any previous visit (from the past 240 hour(s)).   Radiology Studies: Ct Abdomen Pelvis W Contrast  Result Date: 08/16/2017 CLINICAL DATA:  Anemia. GI bleed. Heme-positive stools. Intermittent right lower quadrant pain. EXAM: CT ABDOMEN AND PELVIS WITH CONTRAST TECHNIQUE: Multidetector CT imaging of the abdomen and pelvis was performed using the standard protocol following bolus administration of intravenous contrast. CONTRAST:  173mL ISOVUE-300 IOPAMIDOL (ISOVUE-300) INJECTION 61% COMPARISON:  09/05/2016 CT pelvis. FINDINGS: Lower chest: Nonspecific patchy subpleural reticulation and ground-glass attenuation at both lung bases, not definitely changed since 03/16/2015. Coronary atherosclerosis. Hepatobiliary: Normal liver size. No liver mass. Mild diffuse intrahepatic biliary ductal dilatation. Dilated common bile duct (10 mm  diameter). Faintly calcified gallstones in the nondistended gallbladder with no definite gallbladder wall thickening or pericholecystic fluid. Pancreas: Normal, with no mass or duct dilation. Spleen: Normal size. No mass. Adrenals/Urinary Tract: No discrete adrenal nodules. Normal kidneys with no hydronephrosis and no renal mass. Normal bladder. Stomach/Bowel: Normal non-distended stomach. Normal caliber small bowel with no small bowel wall thickening. Normal appendix. There is a large annular colonic mass involving the entire ascending colon measuring 4.7 x 3.5 x 7.5 cm (series 2/image 36). There is associated haziness of the pericolonic fat in the ascending colon. Marked sigmoid diverticulosis, with no sigmoid wall thickening or pericolonic fat stranding. Vascular/Lymphatic: Atherosclerotic nonaneurysmal abdominal aorta. Patent portal, splenic, hepatic and renal veins. A few mildly prominent rounded nodes in lateral right ascending colonic mesentery measuring up to 7 mm (series 2/image 32). Conglomerate central mesenteric adenopathy up to 2.1 cm (series 2/image 37). Enlarged 1.7 cm porta hepatis node (series 2/image 19). No pelvic adenopathy. Reproductive: Grossly normal uterus.  No adnexal mass. Other: No pneumoperitoneum, ascites or focal fluid collection. Musculoskeletal: No aggressive appearing focal osseous lesions. Severe T2 vertebral compression fracture of uncertain chronicity with 8 mm bony retropulsion into the anterior lumbar spinal canal. Moderate lumbar spondylosis. IMPRESSION: 1. Large annular colonic mass involving the entire ascending colon, highly suspicious for primary colonic malignancy. 2. Nonspecific pericolonic fat haziness associated with the ascending colonic mass, which could be due to tumor filtration or could be inflammatory due to wall perforation. No free air or abscess. No bowel obstruction. 3. Central mesenteric and porta hepatis lymphadenopathy suspicious for nodal metastatic  disease. 4. Cholelithiasis. No evidence of acute cholecystitis. Mild intrahepatic and extrahepatic biliary ductal dilatation with CBD diameter 10 mm. Recommend correlation with serum bilirubin levels. MRCP may be obtained to evaluate for choledocholithiasis, as clinically warranted. 5. Severe L2 vertebral compression fracture of uncertain chronicity. 6. Chronic findings include: Aortic Atherosclerosis (ICD10-I70.0). Coronary atherosclerosis. Marked sigmoid diverticulosis. Electronically Signed   By: Ilona Sorrel M.D.   On: 08/16/2017 17:23    Scheduled Meds: . atorvastatin  20 mg Oral q1800  . feeding supplement (ENSURE ENLIVE)  237 mL Oral TID BM  . insulin aspart  0-9 Units Subcutaneous TID WC  . metoprolol tartrate  25 mg Oral BID  .  pantoprazole  20 mg Oral QODAY   Continuous Infusions:   LOS: 2 days   Marylu Lund, MD [facility] Pager [phone]  If 7PM-7AM, please contact night-coverage [email or web address] Password [facility] 08/18/2017, 4:28 PM

## 2017-08-18 NOTE — Progress Notes (Signed)
Palliative Medicine Team consult was received.   We have set up a family meeting for [place].  If there are urgent needs or questions please call [phone]. Thank you for consulting out team to assist with this patients care.  Micheline Rough, MD [place] Team [phone]

## 2017-08-19 ENCOUNTER — Ambulatory Visit: Payer: Medicare Other | Admitting: Family Medicine

## 2017-08-19 DIAGNOSIS — K922 Gastrointestinal hemorrhage, unspecified: Secondary | ICD-10-CM

## 2017-08-19 LAB — GLUCOSE, CAPILLARY
GLUCOSE-CAPILLARY: 160 mg/dL — AB (ref 70–99)
GLUCOSE-CAPILLARY: 158 mg/dL — AB (ref 70–99)
Glucose-Capillary: 230 mg/dL — ABNORMAL HIGH (ref 70–99)
Glucose-Capillary: 104 mg/dL — ABNORMAL HIGH (ref 70–99)
Glucose-Capillary: 151 mg/dL — ABNORMAL HIGH (ref 70–99)

## 2017-08-19 LAB — HEMOGLOBIN AND HEMATOCRIT, BLOOD
HCT: 29.3 % — ABNORMAL LOW (ref 36.0–46.0)
HEMOGLOBIN: 9.2 g/dL — AB (ref 12.0–15.0)

## 2017-08-19 MED ORDER — PEG-KCL-NACL-NASULF-NA ASC-C 100 G PO SOLR
0.5000 | Freq: Once | ORAL | Status: AC
  Administered 2017-08-19: 100 g via ORAL

## 2017-08-19 MED ORDER — PEG-KCL-NACL-NASULF-NA ASC-C 100 G PO SOLR
0.5000 | Freq: Once | ORAL | Status: AC
  Administered 2017-08-19: 100 g via ORAL
  Filled 2017-08-19: qty 1

## 2017-08-19 MED ORDER — PEG-KCL-NACL-NASULF-NA ASC-C 100 G PO SOLR
0.5000 | Freq: Once | ORAL | Status: DC
  Filled 2017-08-19: qty 1

## 2017-08-19 MED ORDER — PEG-KCL-NACL-NASULF-NA ASC-C 100 G PO SOLR: 0.5000 | Freq: Once | ORAL | Status: DC

## 2017-08-19 MED ORDER — LACTATED RINGERS IV SOLN
INTRAVENOUS | Status: DC
  Administered 2017-08-19 – 2017-08-23 (×6): via INTRAVENOUS

## 2017-08-19 MED ORDER — METOCLOPRAMIDE HCL 5 MG/ML IJ SOLN: 10.0000 mg | Freq: Two times a day (BID) | INTRAMUSCULAR | Status: DC

## 2017-08-19 MED ORDER — METOCLOPRAMIDE HCL 5 MG/ML IJ SOLN
10.0000 mg | Freq: Two times a day (BID) | INTRAMUSCULAR | Status: AC
  Administered 2017-08-19 (×2): 10 mg via INTRAVENOUS
  Filled 2017-08-19 (×2): qty 2

## 2017-08-19 MED ORDER — PEG-KCL-NACL-NASULF-NA ASC-C 100 G PO SOLR: 1.0000 | Freq: Once | ORAL | Status: DC

## 2017-08-19 MED ORDER — SODIUM CHLORIDE 0.9 % IV SOLN: INTRAVENOUS | Status: DC

## 2017-08-19 NOTE — Care Management Note (Signed)
Case Management Note  Patient Details  Name: [name] MRN: [id number] Date of Birth: 23-Oct-1920  Subjective/Objective: Active w/[facility]HHPT rep [facility] following. DNR-Palliative following.R colon mass.Noted for colonoscopy in am.                   Action/Plan:d/c home w/HHC.   Expected Discharge Date:                  Expected Discharge Plan:  [place]  In-House Referral:  Clinical Social Work  Discharge planning Services  CM Consult  Post Acute Care Choice:  Home Health(Active w/[facility]HHPT) Choice offered to:     DME Arranged:    DME Agency:     HH Arranged:    [place] Agency:     Status of Service:  In process, will continue to follow  If discussed at Long Length of Stay Meetings, dates discussed:    Additional Comments:  Dessa Phi, RN 08/19/2017, 12:27 PM

## 2017-08-19 NOTE — Progress Notes (Signed)
Nurse tech called out of room to alert staff that pt. Had a vagal episode on toilet. NT was with pt. The whole time. Pt. Had a large BM and was able to get on wheelchair to get back to bed. Pt. Did not have LOC. A&O x4 when returned to bed. Vital signs obtained: BP 121/60, HR 68, RR 18 and O2 98 on RA. Pt. Diaphoretic and CBG was 160. On call NP Baltazar Najjar paged and made aware. Will continue to monitor pt. Closely.

## 2017-08-19 NOTE — H&P (View-Only) (Signed)
Progress Note   Subjective  Chief Complaint: Abnormal CT of the abdomen  Doing well this morning, her son is in the room.  He has brought her a homemade omelette "made with love" that she is about to eat.  Plans are to proceed with colonoscopy.  Patient does report she had a vagal episode this morning when being assisted from the bed to the toilet.  She did have a bowel movement this morning.   Objective   Vital signs in last 24 hours: Temp:  [97.6 F (36.4 C)-99.4 F (37.4 C)] 97.6 F (36.4 C) (07/09 0640) Pulse Rate:  [68-86] 68 (07/09 0640) Resp:  [16-18] 18 (07/09 0640) BP: (118-144)/(60-69) 121/60 (07/09 0640) SpO2:  [97 %-99 %] 98 % (07/09 0640) Weight:  [107 lb 2.3 oz (48.6 kg)] 107 lb 2.3 oz (48.6 kg) (07/09 0500) Last BM Date: 08/18/17 General:    Elderly white female in NAD Heart:  Regular rate and rhythm; no murmurs Lungs: Respirations even and unlabored, lungs CTA bilaterally Abdomen:  Soft, mild RLQ ttp and nondistended. Normal bowel sounds. Extremities:  Without edema. Neurologic:  Alert and oriented,  grossly normal neurologically. Psych:  Cooperative. Normal mood and affect.  Lab Results: Recent Labs    08/16/17 1532  08/18/17 0455 08/18/17 0751 08/18/17 1239 08/19/17 0711  WBC 10.9*  --  10.2  --   --   --   HGB 7.6*   < > 8.7* 9.3* 9.3* 9.2*  HCT 24.7*   < > 28.1* 30.1* 30.4* 29.3*  PLT 315  --  282  --   --   --    < > = values in this interval not displayed.   BMET Recent Labs    08/16/17 1532  NA 140  K 4.0  CL 102  CO2 28  GLUCOSE 152*  BUN 35*  CREATININE 0.88  CALCIUM 8.8*   LFT Recent Labs    08/16/17 1532  PROT 7.4  ALBUMIN 2.9*  AST 25  ALT 17  ALKPHOS 41  BILITOT 0.4   PT/INR Recent Labs    08/16/17 1532  LABPROT 13.8  INR 1.07     Assessment / Plan:   Assessment: 1.  Abnormal CT of the abdomen: Showing a large right colon mass consistent with colon cancer, haziness of surrounding fat suspicious for nodal  mets, history of right-sided abdominal discomfort, decrease in appetite and gradual weight loss of 20 pounds 2.  Progressive weakness and anemia 3.  Prior history of adenomatous colon polyps: Last colonoscopy 2011 4.  History of CAD  Plan: 1.  Plans to proceed with colonoscopy.  This will be scheduled tomorrow with Dr. Silverio Decamp.  Did review risks, benefits, limitations and alternatives and patient agrees to proceed. 2.  Patient will be on clear liquid diet today and n.p.o. after midnight 3.  Movi prep has been ordered to start this afternoon, split prep 4.  Ordered Reglan to be given with movi prep 5.  Please await any final recommendations from Dr. Silverio Decamp later today  Thank you for your kind consultation, we will continue to follow along.   LOS: 3 days   Levin Erp  08/19/2017, 9:28 AM  Pager # [phone]   Attending physician's note   I have taken an interval history, reviewed the chart and examined the patient. I agree with the Advanced Practitioner's note, impression and recommendations.   Plan for diagnostic colonoscopy tomorrow The risks and benefits as well as alternatives of  endoscopic procedure(s) have been discussed and reviewed. All questions answered. The patient agrees to proceed.  Damaris Hippo , MD [phone]

## 2017-08-19 NOTE — Consult Note (Addendum)
Palliative Care Consult Note  Reason for Consult: Goals of care in light of newly found GI mass  Consult received, chart reviewed including personal review of pertinent imaging and labs.  Discussed case with Dr. Wyline Copas as well as Ellouise Newer from GI service.  I met today with patient, her 2 sons, and her daughters in law.  I introduced palliative care as specialized medical care for people living with serious illness. It focuses on providing relief from the symptoms and stress of a serious illness. The goal is to improve quality of life for both the patient and the family.  She lives alone and reports that her family and her independence are the most important things to her. We discussed clinical course as well as wishes moving forward in light of newly discovered colonic mass.  Values and goals of care important to patient and family were attempted to be elicited.  I reviewed CT scan in detail with family.  We discussed benefits and burdens of pursuing colonoscopy.  We discussed difference between a aggressive medical intervention path and a palliative, comfort focused care path.    Concept of Hospice and Palliative Care were discussed.  - Patient would like to proceed with colonoscopy for diagnostic purposes.  She is still not sure if she would be willing to pursue surgical intervention or chemotherapy, but she would like further information from colonoscopy and potential biopsy to help further clarify diagnosis/prognosis before having further conversation.  Questions and concerns addressed.    PMT will continue to support holistically and continue golas of care conversations based on clinical course and colonoscopy findings..  Total time: 80 minutes Greater than 50%  of this time was spent counseling and coordinating care related to the above assessment and plan.  Micheline Rough, MD [place] Team [phone]

## 2017-08-19 NOTE — Progress Notes (Addendum)
Progress Note   Subjective  Chief Complaint: Abnormal CT of the abdomen  Doing well this morning, her son is in the room.  He has brought her a homemade omelette "made with love" that she is about to eat.  Plans are to proceed with colonoscopy.  Patient does report she had a vagal episode this morning when being assisted from the bed to the toilet.  She did have a bowel movement this morning.   Objective   Vital signs in last 24 hours: Temp:  [97.6 F (36.4 C)-99.4 F (37.4 C)] 97.6 F (36.4 C) (07/09 0640) Pulse Rate:  [68-86] 68 (07/09 0640) Resp:  [16-18] 18 (07/09 0640) BP: (118-144)/(60-69) 121/60 (07/09 0640) SpO2:  [97 %-99 %] 98 % (07/09 0640) Weight:  [107 lb 2.3 oz (48.6 kg)] 107 lb 2.3 oz (48.6 kg) (07/09 0500) Last BM Date: 08/18/17 General:    Elderly white female in NAD Heart:  Regular rate and rhythm; no murmurs Lungs: Respirations even and unlabored, lungs CTA bilaterally Abdomen:  Soft, mild RLQ ttp and nondistended. Normal bowel sounds. Extremities:  Without edema. Neurologic:  Alert and oriented,  grossly normal neurologically. Psych:  Cooperative. Normal mood and affect.  Lab Results: Recent Labs    08/16/17 1532  08/18/17 0455 08/18/17 0751 08/18/17 1239 08/19/17 0711  WBC 10.9*  --  10.2  --   --   --   HGB 7.6*   < > 8.7* 9.3* 9.3* 9.2*  HCT 24.7*   < > 28.1* 30.1* 30.4* 29.3*  PLT 315  --  282  --   --   --    < > = values in this interval not displayed.   BMET Recent Labs    08/16/17 1532  NA 140  K 4.0  CL 102  CO2 28  GLUCOSE 152*  BUN 35*  CREATININE 0.88  CALCIUM 8.8*   LFT Recent Labs    08/16/17 1532  PROT 7.4  ALBUMIN 2.9*  AST 25  ALT 17  ALKPHOS 41  BILITOT 0.4   PT/INR Recent Labs    08/16/17 1532  LABPROT 13.8  INR 1.07     Assessment / Plan:   Assessment: 1.  Abnormal CT of the abdomen: Showing a large right colon mass consistent with colon cancer, haziness of surrounding fat suspicious for nodal  mets, history of right-sided abdominal discomfort, decrease in appetite and gradual weight loss of 20 pounds 2.  Progressive weakness and anemia 3.  Prior history of adenomatous colon polyps: Last colonoscopy 2011 4.  History of CAD  Plan: 1.  Plans to proceed with colonoscopy.  This will be scheduled tomorrow with Dr. Silverio Decamp.  Did review risks, benefits, limitations and alternatives and patient agrees to proceed. 2.  Patient will be on clear liquid diet today and n.p.o. after midnight 3.  Movi prep has been ordered to start this afternoon, split prep 4.  Ordered Reglan to be given with movi prep 5.  Please await any final recommendations from Dr. Silverio Decamp later today  Thank you for your kind consultation, we will continue to follow along.   LOS: 3 days   Levin Erp  08/19/2017, 9:28 AM  Pager # [phone]   Attending physician's note   I have taken an interval history, reviewed the chart and examined the patient. I agree with the Advanced Practitioner's note, impression and recommendations.   Plan for diagnostic colonoscopy tomorrow The risks and benefits as well as alternatives of  endoscopic procedure(s) have been discussed and reviewed. All questions answered. The patient agrees to proceed.  Damaris Hippo , MD [phone]

## 2017-08-19 NOTE — Progress Notes (Signed)
PROGRESS NOTE    [name]  PVX:[id number] DOB: 1920-04-04 DOA: 08/16/2017 PCP: Eulas Post, MD    Brief Narrative:  84 y.o. female with medical history significant of coronary artery disease, paroxysmal A. fib ,diet-controlled type 2 diabetes presents with severe fatigue and weakness over the past several months.  Her symptoms  have become markedly more noticeable over the past few weeks.  She has been to see her PCP numerous times for this.  She has recently noticed that she was unable to even do her ADLs without significant weakness and having to stop numerous times.  Patient lives in the area alone.  She only uses a cane as needed.  was very high functioning until  the past few months when her weakness has limited her.  She saw her PCP this past week she was found to have a hemoglobin around 7.22.  That was down from about 10 in April of this year.  Admitted  to dark black stool once before but not again. She has had Intermittent right lower quadrant abdominal pain.  Denies any nausea vomiting.  She is lost 20 pounds over the past several months.  She states in January for about 3 months she has severe diarrhea.  She did see a GI doctor in the area but she is unsure of the groups name.  Patient denies any chest pain but has had dyspnea on exertion.  Does take Plavix and aspirin.  She does have a remote history of TIA.  Patient was found to be anemic here. She was heme Positive.  Also found to have a large colonic mass on abdominal CT.  sHe has received 1 unit of packed red blood cells in the ED.  ED called GI Dr. Ardis Hughs who will see patient in the morning for consultation.   ED Course: CT of the abdomen and pelvis with contrast showed a large annular colonic mass involving the entire descending colon highly suspicious for primary colonic malignancy.  Was also central mesenteric and porta hepatis lymphadenopathy suspicious for nodal metastatic disease.  Mild intrahepatic and extrahepatic  biliary ductal dilatation.  There was also severe air to vertebral compression fracture of unknown chronicity  Assessment & Plan:   Principal Problem:   Blood loss anemia Active Problems:   Type 2 diabetes mellitus, controlled (HCC)   Hyperlipidemia   CAD (coronary artery disease)   PAF (paroxysmal atrial fibrillation) (HCC)   Carotid artery disease (HCC)   Chronic diastolic (congestive) heart failure (HCC)   Colonic mass- seen on abd CT   Fatigue associated with anemia   History of TIA (transient ischemic attack)  Acute Blood loss anemia -Likely secondary to colonic mass as seen on abd CT -Patient given one unit PRBC with appropriate correction -Appreciate input by Palliative Care. Decision was made to proceed with colonosocopy -GI following.Plans for diagnostic colon tomorrow -Repeat CBC in AM  Type 2 diabetes mellitus, diet controlled ([place]) -Will continue on SSI coverage as needed -Currently stable at present  Hyperlipidemia -Have patient continue statin as tolerated  CAD (coronary artery disease) -Patient is continued on Plavix, follows  with Dr.NAHSER -No chest pains  PAF (paroxysmal atrial fibrillation) ([place])  -Patient is continued on daily aspirin, NOT Coumadin candidate due to history of hemorrhoidal bleeding -remains rate controlled at present  Fatigue -Likely secondary to presenting anemia and dehydration -Physical therapy consulted. Recommendation for home health PT -Patient reports near syncopal episode this AM when up to bathroom this AM -Mucus membranes  appear dry. Skin turgor poor -Will continue patient on basal IVF  History of TIA (transient ischemic attack) -Stable neurologically at present  Chronic Diastolic dysfunction -echo February 2017 showed grade 1 diastolic dysfunction EF 55 to 60% -Currently euvolemic  DVT prophylaxis: SCD's Code Status: DNR Family Communication: Pt in room, family at bedside Disposition Plan: Uncertain at this  time  Consultants:   GI  Palliative care  Procedures:     Antimicrobials: Anti-infectives (From admission, onward)   None      Subjective: No chest pains. Tolerating diet   Objective: Vitals:   08/19/17 0447 08/19/17 0500 08/19/17 0640 08/19/17 1257  BP: 118/62  121/60 (!) 116/55  Pulse: 75  68 67  Resp: 16  18 18   Temp: 98.5 F (36.9 C)  97.6 F (36.4 C)   TempSrc: Oral  Oral   SpO2: 98%  98% 100%  Weight:  48.6 kg (107 lb 2.3 oz)    Height:        Intake/Output Summary (Last 24 hours) at 08/19/2017 1303 Last data filed at 08/19/2017 1153 Gross per 24 hour  Intake -  Output 200 ml  Net -200 ml   Filed Weights   08/16/17 2037 08/18/17 0505 08/19/17 0500  Weight: 50 kg (110 lb 3.7 oz) 50.2 kg (110 lb 10.7 oz) 48.6 kg (107 lb 2.3 oz)    Examination: General exam: Awake, laying in bed, in nad Respiratory system: Normal respiratory effort, no wheezing Cardiovascular system: regular rate, s1, s2 Gastrointestinal system: Soft, nondistended, positive BS Central nervous system: CN2-12 grossly intact, strength intact Extremities: Perfused, no clubbing Skin: Normal skin turgor, no notable skin lesions seen Psychiatry: Mood normal // no visual hallucinations   Data Reviewed: I have personally reviewed following labs and imaging studies  CBC: Recent Labs  Lab 08/13/17 1546  08/16/17 1532  08/17/17 1838 08/18/17 0455 08/18/17 0751 08/18/17 1239 08/19/17 0711  WBC 10.9*  --  10.9*  --   --  10.2  --   --   --   NEUTROABS 7,488  --  7.8*  --   --   --   --   --   --   HGB 7.5*   < > 7.6*   < > 9.2* 8.7* 9.3* 9.3* 9.2*  HCT 24.2*  --  24.7*   < > 29.3* 28.1* 30.1* 30.4* 29.3*  MCV 87.4  --  90.5  --   --  89.8  --   --   --   PLT 311  --  315  --   --  282  --   --   --    < > = values in this interval not displayed.   Basic Metabolic Panel: Recent Labs  Lab 08/13/17 1546 08/16/17 1532  NA 138 140  K 4.8 4.0  CL 102 102  CO2 27 28  GLUCOSE 157* 152*   BUN 33* 35*  CREATININE 0.90* 0.88  CALCIUM 8.8 8.8*   GFR: Estimated Creatinine Clearance: 29.3 mL/min (by C-G formula based on SCr of 0.88 mg/dL). Liver Function Tests: Recent Labs  Lab 08/16/17 1532  AST 25  ALT 17  ALKPHOS 41  BILITOT 0.4  PROT 7.4  ALBUMIN 2.9*   No results for input(s): LIPASE, AMYLASE in the last 168 hours. No results for input(s): AMMONIA in the last 168 hours. Coagulation Profile: Recent Labs  Lab 08/16/17 1532  INR 1.07   Cardiac Enzymes: No results for input(s): CKTOTAL, CKMB, CKMBINDEX,  TROPONINI in the last 168 hours. BNP (last 3 results) No results for input(s): PROBNP in the last 8760 hours. HbA1C: Recent Labs    08/17/17 0402  HGBA1C 6.4*   CBG: Recent Labs  Lab 08/18/17 1609 08/18/17 2051 08/19/17 0642 08/19/17 0736 08/19/17 1134  GLUCAP 201* 150* 160* 158* 230*   Lipid Profile: No results for input(s): CHOL, HDL, LDLCALC, TRIG, CHOLHDL, LDLDIRECT in the last 72 hours. Thyroid Function Tests: No results for input(s): TSH, T4TOTAL, FREET4, T3FREE, THYROIDAB in the last 72 hours. Anemia Panel: Recent Labs    08/16/17 1519  VITAMINB12 593  FOLATE 21.7  FERRITIN 19  TIBC 337  IRON 29  RETICCTPCT 2.8   Sepsis Labs: No results for input(s): PROCALCITON, LATICACIDVEN in the last 168 hours.  No results found for this or any previous visit (from the past 240 hour(s)).   Radiology Studies: No results found.  Scheduled Meds: . atorvastatin  20 mg Oral q1800  . feeding supplement (ENSURE ENLIVE)  237 mL Oral TID BM  . insulin aspart  0-9 Units Subcutaneous TID WC  . metoCLOPramide (REGLAN) injection  10 mg Intravenous BID  . metoprolol tartrate  25 mg Oral BID  . pantoprazole  20 mg Oral QODAY  . peg 3350 powder  0.5 kit Oral Once   And  . peg 3350 powder  0.5 kit Oral Once   Continuous Infusions: . lactated ringers       LOS: 3 days   Marylu Lund, MD [facility] Pager [phone]  If 7PM-7AM,  please contact night-coverage [email or web address] Password [email or web address] 08/19/2017, 1:03 PM

## 2017-08-19 NOTE — Progress Notes (Signed)
PMT progress note  Patient is resting in bed, she is awake alert, in good spirits.   Chart reviewed No family at bedside currently  BP 121/60 (BP Location: Right Arm)   Pulse 68   Temp 97.6 F (36.4 C) (Oral)   Resp 18   Ht 5' 6.5" (1.689 m)   Wt 48.6 kg (107 lb 2.3 oz)   SpO2 98%   BMI 17.03 kg/m   Awake alert In no distress Regular S1 S2 Mild abd tenderness No edema Non focal  PPS 40%  Large R colon mass, suspicious for malignancy.  History of CAD, Paroxysmal A fib, diet controlled DM.   PMT initial consult completed.  Will follow hospital course, to undergo colonoscopy soon.   15 minutes spent.  Loistine Chance MD [phone]

## 2017-08-19 NOTE — Care Management Important Message (Signed)
Important Message  Patient Details  Name: [name] MRN: [id number] Date of Birth: 01-May-1920   Medicare Important Message Given:  Yes    [name] 08/19/2017, 1:25 PMImportant Message  Patient Details  Name: [name] MRN: [id number] Date of Birth: Apr 12, 1920   Medicare Important Message Given:  Yes    [name] 08/19/2017, 1:25 PM

## 2017-08-20 ENCOUNTER — Encounter (HOSPITAL_COMMUNITY): Payer: Self-pay

## 2017-08-20 ENCOUNTER — Encounter (HOSPITAL_COMMUNITY)
Admission: EM | Disposition: A | Discharge status: Skilled Nursing Facility | Payer: Self-pay | Source: Home / Self Care | Attending: Family Medicine

## 2017-08-20 ENCOUNTER — Inpatient Hospital Stay (HOSPITAL_COMMUNITY): Payer: Medicare Other | Admitting: Certified Registered Nurse Anesthetist

## 2017-08-20 ENCOUNTER — Inpatient Hospital Stay (HOSPITAL_COMMUNITY): Payer: Medicare Other

## 2017-08-20 DIAGNOSIS — E118 Type 2 diabetes mellitus with unspecified complications: Secondary | ICD-10-CM

## 2017-08-20 DIAGNOSIS — I480 Paroxysmal atrial fibrillation: Secondary | ICD-10-CM

## 2017-08-20 DIAGNOSIS — C182 Malignant neoplasm of ascending colon: Principal | ICD-10-CM

## 2017-08-20 DIAGNOSIS — D124 Benign neoplasm of descending colon: Secondary | ICD-10-CM

## 2017-08-20 DIAGNOSIS — I5032 Chronic diastolic (congestive) heart failure: Secondary | ICD-10-CM

## 2017-08-20 DIAGNOSIS — K639 Disease of intestine, unspecified: Secondary | ICD-10-CM

## 2017-08-20 DIAGNOSIS — D500 Iron deficiency anemia secondary to blood loss (chronic): Secondary | ICD-10-CM

## 2017-08-20 DIAGNOSIS — E43 Unspecified severe protein-calorie malnutrition: Secondary | ICD-10-CM

## 2017-08-20 DIAGNOSIS — E785 Hyperlipidemia, unspecified: Secondary | ICD-10-CM

## 2017-08-20 HISTORY — PX: SCHLEROTHERAPY: SHX5440

## 2017-08-20 HISTORY — PX: COLONOSCOPY WITH PROPOFOL: SHX5780

## 2017-08-20 HISTORY — PX: BIOPSY: SHX5522

## 2017-08-20 LAB — TYPE AND SCREEN
ABO/RH(D): A POS
Antibody Screen: NEGATIVE
UNIT DIVISION: 0
Unit division: 0

## 2017-08-20 LAB — BPAM RBC
BLOOD PRODUCT EXPIRATION DATE: 201908032359
Blood Product Expiration Date: 201908032359
ISSUE DATE / TIME: 201907061716
UNIT TYPE AND RH: 6200
Unit Type and Rh: 6200

## 2017-08-20 LAB — BASIC METABOLIC PANEL WITH GFR
Anion gap: 9 (ref 5–15)
BUN: 22 mg/dL (ref 8–23)
CO2: 24 mmol/L (ref 22–32)
Calcium: 8.2 mg/dL — ABNORMAL LOW (ref 8.9–10.3)
Chloride: 108 mmol/L (ref 98–111)
Creatinine, Ser: 0.85 mg/dL (ref 0.44–1.00)
GFR calc Af Amer: >60 mL/min
GFR calc non Af Amer: 56 mL/min — ABNORMAL LOW
Glucose, Bld: 122 mg/dL — ABNORMAL HIGH (ref 70–99)
Potassium: 3.8 mmol/L (ref 3.5–5.1)
Sodium: 141 mmol/L (ref 135–145)

## 2017-08-20 LAB — CBC
HCT: 25.4 % — ABNORMAL LOW (ref 36.0–46.0)
HEMOGLOBIN: 8 g/dL — AB (ref 12.0–15.0)
MCH: 28.3 pg (ref 26.0–34.0)
MCHC: 31.5 g/dL (ref 30.0–36.0)
MCV: 89.8 fL (ref 78.0–100.0)
Platelets: 268 10*3/uL (ref 150–400)
RBC: 2.83 MIL/uL — AB (ref 3.87–5.11)
RDW: 15.1 % (ref 11.5–15.5)
WBC: 8.7 10*3/uL (ref 4.0–10.5)

## 2017-08-20 LAB — GLUCOSE, CAPILLARY
GLUCOSE-CAPILLARY: 162 mg/dL — AB (ref 70–99)
Glucose-Capillary: 116 mg/dL — ABNORMAL HIGH (ref 70–99)
Glucose-Capillary: 107 mg/dL — ABNORMAL HIGH (ref 70–99)
Glucose-Capillary: 138 mg/dL — ABNORMAL HIGH (ref 70–99)

## 2017-08-20 LAB — HEMOGLOBIN AND HEMATOCRIT, BLOOD
HEMATOCRIT: 28.7 % — AB (ref 36.0–46.0)
HEMOGLOBIN: 8.9 g/dL — AB (ref 12.0–15.0)

## 2017-08-20 SURGERY — COLONOSCOPY WITH PROPOFOL
Anesthesia: Monitor Anesthesia Care

## 2017-08-20 MED ORDER — CHLORHEXIDINE GLUCONATE CLOTH 2 % EX PADS
6.0000 | MEDICATED_PAD | Freq: Once | CUTANEOUS | Status: AC
  Administered 2017-08-21: 6 via TOPICAL

## 2017-08-20 MED ORDER — CHLORHEXIDINE GLUCONATE CLOTH 2 % EX PADS
6.0000 | MEDICATED_PAD | Freq: Once | CUTANEOUS | Status: AC
  Administered 2017-08-20: 6 via TOPICAL

## 2017-08-20 MED ORDER — PHENYLEPHRINE HCL 10 MG/ML IJ SOLN
INTRAMUSCULAR | Status: DC | PRN
  Administered 2017-08-20 (×4): 80 ug via INTRAVENOUS

## 2017-08-20 MED ORDER — SODIUM CHLORIDE 0.9 % IV SOLN
2.0000 g | INTRAVENOUS | Status: AC
  Administered 2017-08-21: 2 g via INTRAVENOUS
  Filled 2017-08-20: qty 2

## 2017-08-20 MED ORDER — ALVIMOPAN 12 MG PO CAPS
12.0000 mg | ORAL_CAPSULE | ORAL | Status: AC
  Administered 2017-08-21: 12 mg via ORAL
  Filled 2017-08-20: qty 1

## 2017-08-20 MED ORDER — PROPOFOL 10 MG/ML IV BOLUS
INTRAVENOUS | Status: AC
  Filled 2017-08-20: qty 40

## 2017-08-20 MED ORDER — PROPOFOL 500 MG/50ML IV EMUL
INTRAVENOUS | Status: DC | PRN
  Administered 2017-08-20: 50 ug/kg/min via INTRAVENOUS

## 2017-08-20 MED ORDER — SPOT INK MARKER SYRINGE KIT
PACK | SUBMUCOSAL | Status: DC | PRN
  Administered 2017-08-20: 7 mL via SUBMUCOSAL

## 2017-08-20 MED ORDER — SPOT INK MARKER SYRINGE KIT
PACK | SUBMUCOSAL | Status: AC
  Filled 2017-08-20: qty 5

## 2017-08-20 MED ORDER — PROPOFOL 10 MG/ML IV BOLUS
INTRAVENOUS | Status: DC | PRN
  Administered 2017-08-20 (×2): 10 mg via INTRAVENOUS

## 2017-08-20 SURGICAL SUPPLY — 22 items

## 2017-08-20 NOTE — Progress Notes (Signed)
PROGRESS NOTE    [name]  UDJ:[id number] DOB: [birth year] DOA: 08/16/2017 PCP: Eulas Post, MD   Brief Narrative: [name] is a 84 y.o. femalewith medical history significant ofcoronary artery disease,paroxysmal A. fib ,diet-controlled type 2 diabetes. She presented with fatigue and found to have symptomatic anemia secondary to colonic mass +/- diverticulosis. Plan for partial colectomy.   Assessment & Plan:   Principal Problem:   Blood loss anemia Active Problems:   Type 2 diabetes mellitus, controlled (HCC)   Hyperlipidemia   Symptomatic anemia   CAD (coronary artery disease)   PAF (paroxysmal atrial fibrillation) (HCC)   Carotid artery disease (HCC)   Chronic diastolic (congestive) heart failure (HCC)   Colonic mass- seen on abd CT   Fatigue associated with anemia   History of TIA (transient ischemic attack)   Gastrointestinal hemorrhage   Protein-calorie malnutrition, severe   Acute blood loss anemia In setting of colonic mass. Concern for malignancy. Patient is s/p 1 units of PRBC. Colonoscopy significant for mass and diverticulosis, but no active bleeding. -Repeat H&H this afternoon; if less than 7, will give 1 unit of PRBC -Repeat CBC tomorrow morning  Colonic mass Likely malignancy. Colonoscopy also suspicious. Biopsies obtained and are pending. General surgery consulted for surgical resection. -General surgery recommendations -Biopsy results  Type 2 diabetes, diet controlled Diet controlled -Continue SSI  Hyperlipidemia -Continue Lipitor  CAD On plavix and lipitor as an outpatient. Plavix on hold secondary to GI bleeding -Continue Lipitor  History of TIA -Continue Lipitor  Chronic diastolic heart dysfunction Euvolemic.  Paroxysmal atrial fibrillation Rate controlled. On metoprolol as an outpateint. Not on anticoagulation -Continue metoprolol   DVT prophylaxis: SCDs Code Status:   Code Status: DNR Family Communication: Two  sons and two daughter-in-laws at bedside Disposition Plan: Discharge pending general surgery/goals of care   Consultants:   Gastroenterology  Procedures:   7/6: 1 unit PRBC  7/9: Colonoscopy  Antimicrobials:  None    Subjective: No issues today. No rectal bleeding.  Objective: Vitals:   08/20/17 1040 08/20/17 1200 08/20/17 1245 08/20/17 1500  BP: (!) 125/42 (!) 164/60 (!) 153/65 136/61  Pulse: 72 73 62 72  Resp: 17  18 18   Temp:   (!) 97.4 F (36.3 C) (!) 97.5 F (36.4 C)  TempSrc:   Oral Oral  SpO2: 98%  100% 100%  Weight:      Height:        Intake/Output Summary (Last 24 hours) at 08/20/2017 1516 Last data filed at 08/20/2017 1500 Gross per 24 hour  Intake 2295 ml  Output -  Net 2295 ml   Filed Weights   08/18/17 0505 08/19/17 0500 08/20/17 0500  Weight: 50.2 kg (110 lb 10.7 oz) 48.6 kg (107 lb 2.3 oz) 49.9 kg (110 lb)    Examination:  General exam: Appears calm and comfortable Respiratory system: Clear to auscultation. Respiratory effort normal. Cardiovascular system: S1 & S2 heard, RRR. No murmurs, rubs, gallops or clicks. Gastrointestinal system: Abdomen is nondistended, soft and nontender. No organomegaly or masses felt. Normal bowel sounds heard. Central nervous system: Alert and oriented. No focal neurological deficits. Extremities: No edema. No calf tenderness Skin: No cyanosis. No rashes Psychiatry: Judgement and insight appear normal. Mood & affect appropriate.     Data Reviewed: I have personally reviewed following labs and imaging studies  CBC: Recent Labs  Lab 08/13/17 1546  08/16/17 1532  08/18/17 0455 08/18/17 0751 08/18/17 1239 08/19/17 5885 08/20/17 0523 08/20/17 1209  WBC 10.9*  --  10.9*  --  10.2  --   --   --  8.7  --   NEUTROABS 7,488  --  7.8*  --   --   --   --   --   --   --   HGB 7.5*   < > 7.6*   < > 8.7* 9.3* 9.3* 9.2* 8.0* 8.9*  HCT 24.2*  --  24.7*   < > 28.1* 30.1* 30.4* 29.3* 25.4* 28.7*  MCV 87.4  --  90.5   --  89.8  --   --   --  89.8  --   PLT 311  --  315  --  282  --   --   --  268  --    < > = values in this interval not displayed.   Basic Metabolic Panel: Recent Labs  Lab 08/13/17 1546 08/16/17 1532 08/20/17 0523  NA 138 140 141  K 4.8 4.0 3.8  CL 102 102 108  CO2 27 28 24   GLUCOSE 157* 152* 122*  BUN 33* 35* 22  CREATININE 0.90* 0.88 0.85  CALCIUM 8.8 8.8* 8.2*   GFR: Estimated Creatinine Clearance: 31.2 mL/min (by C-G formula based on SCr of 0.85 mg/dL). Liver Function Tests: Recent Labs  Lab 08/16/17 1532  AST 25  ALT 17  ALKPHOS 41  BILITOT 0.4  PROT 7.4  ALBUMIN 2.9*   No results for input(s): LIPASE, AMYLASE in the last 168 hours. No results for input(s): AMMONIA in the last 168 hours. Coagulation Profile: Recent Labs  Lab 08/16/17 1532  INR 1.07   Cardiac Enzymes: No results for input(s): CKTOTAL, CKMB, CKMBINDEX, TROPONINI in the last 168 hours. BNP (last 3 results) No results for input(s): PROBNP in the last 8760 hours. HbA1C: No results for input(s): HGBA1C in the last 72 hours. CBG: Recent Labs  Lab 08/19/17 1134 08/19/17 1719 08/19/17 2139 08/20/17 0757 08/20/17 1137  GLUCAP 230* 104* 151* 116* 162*   Lipid Profile: No results for input(s): CHOL, HDL, LDLCALC, TRIG, CHOLHDL, LDLDIRECT in the last 72 hours. Thyroid Function Tests: No results for input(s): TSH, T4TOTAL, FREET4, T3FREE, THYROIDAB in the last 72 hours. Anemia Panel: No results for input(s): VITAMINB12, FOLATE, FERRITIN, TIBC, IRON, RETICCTPCT in the last 72 hours. Sepsis Labs: No results for input(s): PROCALCITON, LATICACIDVEN in the last 168 hours.  No results found for this or any previous visit (from the past 240 hour(s)).       Radiology Studies: No results found.      Scheduled Meds: . atorvastatin  20 mg Oral q1800  . feeding supplement (ENSURE ENLIVE)  237 mL Oral TID BM  . insulin aspart  0-9 Units Subcutaneous TID WC  . metoprolol tartrate  25 mg  Oral BID  . pantoprazole  20 mg Oral QODAY   Continuous Infusions: . lactated ringers 75 mL/hr at 08/20/17 1154     LOS: 4 days     Cordelia Poche, MD [facility] 08/20/2017, 3:16 PM Pager: [phone]  If 7PM-7AM, please contact night-coverage [email or web address] 08/20/2017, 3:16 PM

## 2017-08-20 NOTE — Transfer of Care (Signed)
Immediate Anesthesia Transfer of Care Note  Patient: [name]  Procedure(s) Performed: COLONOSCOPY WITH PROPOFOL (N/A ) BIOPSY SCHLEROTHERAPY OF VARICES  Patient Location: PACU  Anesthesia Type:MAC  Level of Consciousness: awake, alert , oriented and patient cooperative  Airway & Oxygen Therapy: Patient Spontanous Breathing and Patient connected to nasal cannula oxygen  Post-op Assessment: Report given to RN and Post -op Vital signs reviewed and stable  Post vital signs: Reviewed and stable  Last Vitals:  Vitals Value Taken Time  BP    Temp    Pulse 68 08/20/2017 10:30 AM  Resp 20 08/20/2017 10:30 AM  SpO2 100 % 08/20/2017 10:30 AM  Vitals shown include unvalidated device data.  Last Pain:  Vitals:   08/20/17 0841  TempSrc: Oral  PainSc: 0-No pain         Complications: No apparent anesthesia complications

## 2017-08-20 NOTE — Anesthesia Postprocedure Evaluation (Signed)
Anesthesia Post Note  Patient: [name]  Procedure(s) Performed: COLONOSCOPY WITH PROPOFOL (N/A ) BIOPSY SCHLEROTHERAPY OF VARICES     Patient location during evaluation: PACU Anesthesia Type: MAC Level of consciousness: awake and alert Pain management: pain level controlled Vital Signs Assessment: post-procedure vital signs reviewed and stable Respiratory status: spontaneous breathing, nonlabored ventilation, respiratory function stable and patient connected to nasal cannula oxygen Cardiovascular status: stable and blood pressure returned to baseline Postop Assessment: no apparent nausea or vomiting Anesthetic complications: no    Last Vitals:  Vitals:   08/20/17 1031 08/20/17 1040  BP: (!) 120/50 (!) 125/42  Pulse: 68 72  Resp: 20 17  Temp: (!) 36.4 C   SpO2: 100% 98%    Last Pain:  Vitals:   08/20/17 1040  TempSrc:   PainSc: 0-No pain                 Zayla Agar

## 2017-08-20 NOTE — Interval H&P Note (Signed)
History and Physical Interval Note:  08/20/2017 8:46 AM  [name]  has presented today for surgery, with the diagnosis of Abnormal CT Abdomen  The various methods of treatment have been discussed with the patient and family. After consideration of risks, benefits and other options for treatment, the patient has consented to  Procedure(s): COLONOSCOPY WITH PROPOFOL (N/A) as a surgical intervention .  The patient's history has been reviewed, patient examined, no change in status, stable for surgery.  I have reviewed the patient's chart and labs.  Questions were answered to the patient's satisfaction.     Kavitha Nandigam

## 2017-08-20 NOTE — Anesthesia Preprocedure Evaluation (Addendum)
Anesthesia Evaluation  Patient identified by MRN, date of birth, ID band Patient awake    Reviewed: Allergy & Precautions, H&P , NPO status , Patient's Chart, lab work & pertinent test results, reviewed documented beta blocker date and time   Airway Mallampati: II  TM Distance: >3 FB Neck ROM: full    Dental no notable dental hx.    Pulmonary    Pulmonary exam normal breath sounds clear to auscultation       Cardiovascular Exercise Tolerance: Good hypertension, + CAD, + Peripheral Vascular Disease and +CHF  + dysrhythmias Atrial Fibrillation  Rhythm:regular Rate:Normal  Echo 5/18: EF 60-65, no RWMA, Gr 2 DD, mild-mod MR, mod LAE //  Echo 2/17: Mild LVH, mild focal basal septal hypertrophy, EF 55-60%, no RWMA, Gr 1 DD, MAC, mild MR, mild  //  Echo 4/14: EF 60-65%, normal wall motion, Gr 1 DD, MAC, mild MR, PASP 32, reduced excursion of AV noncoronary cusp.     LEFT BUNDLE BRANCH BLOCK     Neuro/Psych Carotid artery disease    Left CEA in 10/12.  Carotid dopplers (10/15) with 1-39% bilateral ICA stenosis. // Carotid US 10/17: R < 40; patent L CEA site  TIA 09/02/2006  Echocardiogram 6/12: Mild to moderate MR, trace AI, trace TR, PASP 34, bubble study negative for intracardiac shunting, normal EF (greater than 55%) //  Possible TIA in 10/15 with visual field cut.  MRI (10/15) with no CVA. Linq monitor placed.   CVA    GI/Hepatic Neg liver ROS, GERD  ,  Endo/Other  negative endocrine ROSdiabetes  Renal/GU negative Renal ROS     Musculoskeletal  (+) Arthritis , Osteoarthritis,    Abdominal   Peds  Hematology  (+) anemia ,   Anesthesia Other Findings   Reproductive/Obstetrics                             Anesthesia Physical Anesthesia Plan  ASA: III  Anesthesia Plan: MAC   Post-op Pain Management:    Induction:   PONV Risk Score and Plan: 2 and Treatment may vary due to age or  medical condition  Airway Management Planned: Nasal Cannula and Natural Airway  Additional Equipment:   Intra-op Plan:   Post-operative Plan:   Informed Consent: I have reviewed the patients History and Physical, chart, labs and discussed the procedure including the risks, benefits and alternatives for the proposed anesthesia with the patient or authorized representative who has indicated his/her understanding and acceptance.   Dental Advisory Given  Plan Discussed with: CRNA, Anesthesiologist and Surgeon  Anesthesia Plan Comments:       Anesthesia Quick Evaluation

## 2017-08-20 NOTE — Op Note (Signed)
[facility] Patient Name: [name] Procedure Date: 08/20/2017 MRN: [id number] Attending MD: Mauri Pole , MD Date of Birth: December 15, 1920 CSN: [id number] Age: 84 Admit Type: Inpatient Procedure:                Colonoscopy Indications:              Evaluation of unexplained GI bleeding, Unexplained                            iron deficiency anemia, Abnormal CT of the GI tract Providers:                Mauri Pole, MD, Cleda Daub, RN, Charolette Child, Technician, Christell Faith, CRNA Referring MD:              Medicines:                Monitored Anesthesia Care Complications:            No immediate complications. Estimated Blood Loss:     Estimated blood loss was minimal. Procedure:                Pre-Anesthesia Assessment:                           - Prior to the procedure, a History and Physical                            was performed, and patient medications and                            allergies were reviewed. The patient's tolerance of                            previous anesthesia was also reviewed. The risks                            and benefits of the procedure and the sedation                            options and risks were discussed with the patient.                            All questions were answered, and informed consent                            was obtained. Prior Anticoagulants: The patient has                            taken no previous anticoagulant or antiplatelet                            agents. ASA Grade Assessment: III - A patient with  severe systemic disease. After reviewing the risks                            and benefits, the patient was deemed in                            satisfactory condition to undergo the procedure.                           After obtaining informed consent, the colonoscope                            was passed under direct vision.  Throughout the                            procedure, the patient's blood pressure, pulse, and                            oxygen saturations were monitored continuously. The                            EC-[id number] ([id number]) scope was introduced through                            the anus and advanced to the the ascending colon to                            examine a mass. This was the intended extent. The                            colonoscopy was technically difficult and complex                            due to a partially obstructing mass. The patient                            tolerated the procedure well. The quality of the                            bowel preparation was fair. The rectum was                            photographed. Scope In: 9:39:24 AM Scope Out: 10:17:48 AM Total Procedure Duration: 0 hours 38 minutes 24 seconds  Findings:      The perianal and digital rectal examinations were normal.      An infiltrative partially obstructing large mass was found in the       ascending colon. The mass was circumferential. Oozing was present. This       was biopsied with a cold forceps for histology. Distal fold was tattooed       with an injection of 4 mL of Spot (carbon black).      An infiltrative polypoid ulcerated medium-sized mass was found in the       distal descending colon.  The mass was non-circumferential. The mass       measured five cm in length. In addition, its diameter measured thirty       mm. No bleeding was present. Biopsies were taken with a cold forceps for       histology. Distal fold was tattooed with an injection of 3 mL of Spot       (carbon black).      Multiple small and large-mouthed diverticula were found in the sigmoid       colon.      Non-bleeding internal hemorrhoids were found during retroflexion. The       hemorrhoids were small. Impression:               - Preparation of the colon was fair.                           - Likely malignant partially  obstructing tumor in                            the ascending colon. Biopsied. Tattooed.                           - Likely malignant tumor in the distal descending                            colon. Biopsied. Tattooed.                           - Moderate diverticulosis in the sigmoid colon.                           - Non-bleeding internal hemorrhoids. Moderate Sedation:      N/A- Per Anesthesia Care Recommendation:           - Patient has a contact number available for                            emergencies. The signs and symptoms of potential                            delayed complications were discussed with the                            patient. Return to normal activities tomorrow.                            Written discharge instructions were provided to the                            patient.                           - Resume previous diet.                           - Continue present medications.                           -  Await pathology results.                           - No recommendation at this time regarding repeat                            colonoscopy. Procedure Code(s):        --- Professional ---                           [phone], 34, Colonoscopy, flexible; with directed                            submucosal injection(s), any substance                           36468, 79, Colonoscopy, flexible; with biopsy,                            single or multiple Diagnosis Code(s):        --- Professional ---                           K64.8, Other hemorrhoids                           K56.690, Other partial intestinal obstruction                           D49.0, Neoplasm of unspecified behavior of                            digestive system                           K92.2, Gastrointestinal hemorrhage, unspecified                           D50.9, Iron deficiency anemia, unspecified                           K57.30, Diverticulosis of large intestine without                             perforation or abscess without bleeding                           R93.3, Abnormal findings on diagnostic imaging of                            other parts of digestive tract CPT copyright 2017 [employer]. All rights reserved. The codes documented in this report are preliminary and upon coder review may  be revised to meet current compliance requirements. Mauri Pole, MD 08/20/2017 10:27:03 AM This report has been signed electronically. Number of Addenda: 0

## 2017-08-20 NOTE — Progress Notes (Signed)
PMT progress note  Patient awake alert resting in bed. Both her sons has well as some other family members present at the bedside. Patient has returned from her colonoscopy. She has been made aware of the results. She has also met with surgical colleagues.  Palliative medicine following along for supportive care as well as continuing participation in goals of care discussions and determination of patient's goals/wishes/values.  Patient is resting in bed. She is thankful for all of the information she has received. She does admit that it is overwhelming. She states she does not want to be a burden on her family members.  BP (!) 153/65 (BP Location: Right Arm)   Pulse 62   Temp (!) 97.4 F (36.3 C) (Oral)   Resp 18   Ht 5' 6.5" (1.689 m)   Wt 49.9 kg (110 lb)   SpO2 100%   BMI 17.49 kg/m   Labs and imaging reviewed. Colonoscopy report reviewed and discussed with the patient in detail.  Awake alert Appears a little pale and weak Oriented, answers questions appropriately S1-S2 Regular Abdomen mildly distended Non focal No edema  Palliative performance scale 64%   84 year old lady who was living by herself here in [place], [place]. One of her sons lives near [place], [place], other son lives near [place]. Patient reports that she was in the process of relocating to an assisted living facility near [place], [place].  1.Ascending and distal descending colonic masses High suspicion for primary colon malignancy, probable intra-abdominal metastases L2 compression fracture Diabetes, hypertension Coronary artery disease history of paroxysmal atrial fibrillation.  Ongoing discussions regarding the possibility of the patient wanting to proceed with surgery, partial colectomy, will possibly end up needing a colostomy. The nature of this procedure is palliative and not curative.  Discussed with patient about all of the above. She likely  wishes to go forward with surgery. Discussed about postoperative course, possible need for skilled nursing facility for rehabilitation, outpatient follow-up with surgery as well as medical oncology etc. Additionally, also discussed in some detail about hospice philosophy of care and what that entails. Palliative medicine team will continue to follow along.  35 minutes spent [facility] palliative medicine team [phone]

## 2017-08-20 NOTE — Consult Note (Signed)
Reason for Consult:  Obstructing colon cancer Referring Physician: Dr. Pat Kocher Chief complaint:  weakness and fatigue PCP:  Eulas Post, MD  [name] is an 84 y.o. female.   HPI: Patient is a 84 year old female with multiple medical issues who presented on 08/16/2017 with complaints of weakness and fatigue.  Symptoms started several months ago and has become worse over the last couple weeks.  She has been seen by her PCP multiple times before and has been anemia.  She has a history of chronic anemia.  On 08/13/2017 hemoglobin 7.5, hematocrit 24.  She was seen by Dr. Elease Hashimoto on 08/15/17, and he talked to the hospitalist.  They recommended an ED evaluation.  She was transferred to the ED on 08/16/2017 by EMS and admitted with blood loss anemia.  A CT with contrast was obtained on 08/16/2017 in the ED.  Stools were positive for blood.  CT showed a large annular 4.7 x 3.5 x 7.5 cm colonic mass involving the ascending colon highly suspicious for malignancy. Some nonspecific pericolonic fat haziness which could be due to tumor or inflammatory process.  Central mesenteric and portal lymphadenopathy suspicious for metastatic disease.  Cholelithiasis with no evidence for acute cholecystitis.  L2 vertebral compression fractures chronic atherosclerotic disease.  She was transfused in the ED and seen by Dr. Owens Loffler [facility].  On 08/18/2017 she was seen by Dr. Alen Blew palliative care and he reports she would like to proceed with a colonoscopy for a diagnostic evaluation.  At that point she was not sure she was willing to pursue surgical intervention or chemotherapy.  Today she underwent colonoscopy by Dr. Silverio Decamp, colonoscopy shows a large obstructing mass in the ascending colon.  It was a circumferential mass and was oozing, biopsies were obtained site was injected with carbon black.  A second infiltrative polypoid ulcerated medium-sized mass was found in the distal descending colon the mass was  noncircumferential, 5 cm in length.  Its diameter measured 30 mm.  There is no bleeding present.  Biopsies were taken and it was tattooed with carbon black also.  There were multiple large and small diverticuli in the sigmoid colon.  We are asked to see.  Last colonoscopy I can see on her is in July 2011.     Past Medical History:  Diagnosis Date  . ANEMIA, NORMOCYTIC 11/30/2009  . CAD (coronary artery disease) 04/27/2008   Non-Obstructive >> Cardiac catheterization 6/08: EF 55%, 1+ MR, Dx 50%, LAD 50%, dLAD 40%, OM1 50-60% (up to 70%), pOM 30%, pRCA 40%, mRCA 50-60%, pPDA 50-60% (up to 70%).  Medical therapy was recommended // MV 11/14: Normal stress nuclear study. LV Ejection Fraction: 67%.   2D echo 8/75/6433: Normal systolic function, EF 60 to 65% normal wall motion, 2 diastolic dysfunction, mild calcified annulus, mitral valve shows some mild to moderate MR, left atrium was moderately dilated.   . Carotid artery disease ([place])    Left CEA in 10/12.  Carotid dopplers (10/15) with 1-39% bilateral ICA stenosis. // Carotid US 10/17: R < 40; patent L CEA site  . COLONIC POLYPS, ADENOMATOUS 12/26/2009  . DIABETES MELLITUS, TYPE II 08/11/2006  . DIVERTICULOSIS, COLON 08/11/2006  . GERD 02/16/2007  . Hemorrhoids    s/p banding  . History of echocardiogram    Echo 5/18: EF 60-65, no RWMA, Gr 2 DD, mild-mod MR, mod LAE // Echo 2/17: Mild LVH, mild focal basal septal hypertrophy, EF 55-60%, no RWMA, Gr 1 DD, MAC, mild  MR, mild  // Echo 4/14: EF 60-65%, normal wall motion, Gr 1 DD, MAC, mild MR, PASP 32, reduced excursion of AV noncoronary cusp.    Marland Kitchen HYPERLIPIDEMIA 09/09/2006  . HYPERTENSION 09/09/2006  . LEFT BUNDLE BRANCH BLOCK 08/11/2006  . OSTEOARTHRITIS 08/11/2006  . OSTEOPOROSIS 08/11/2006  . PAF (paroxysmal atrial fibrillation) ([place]) 04/12/2009   Paroxysmal, only prior documented episode was years ago.  She was not started on anticoagulation back then due to hemorrhoidal bleeding, apparently  profuse.  Event monitor (3/14) showed only NSR.    Marland Kitchen Stroke ([place]) 12-06-13  . Syncope    8/15. EEG (5/15) was unremarkable. // 4/18: ILR neg for arrhythmia - ? orthostatic (no prodrome reported) >> c/b SAH (small)   . TIA 09/02/2006   Echocardiogram 6/12: Mild to moderate MR, trace AI, trace TR, PASP 34, bubble study negative for intracardiac shunting, normal EF (greater than 55%) //  Possible TIA in 10/15 with visual field cut.  MRI (10/15) with no CVA. Linq monitor placed.    Past Surgical History:  Procedure Laterality Date  . CAROTID ENDARTERECTOMY Left 12-05-2010  . CLOSED REDUCTION METATARSAL FRACTURE     right 5th  . COLONOSCOPY W/ POLYPECTOMY  2001  . EYE SURGERY     catarac  . LOOP RECORDER IMPLANT N/A 01/03/2014   Procedure: LOOP RECORDER IMPLANT;  Surgeon: Evans Lance, MD;  Location: [facility] CATH LAB;  Service: Cardiovascular;  Laterality: N/A;  . TONSILLECTOMY      Family History  Problem Relation Age of Onset  . Heart disease Mother 72  . Kidney disease Mother   . Heart attack Mother   . Congestive Heart Failure Father 35  . Colon cancer Brother   . Heart disease Brother   . Breast cancer Sister   . Uterine cancer Sister     Social History:  reports that she has never smoked. She has never used smokeless tobacco. She reports that she does not drink alcohol or use drugs.  Allergies:  Allergies  Allergen Reactions  . Sulfamethoxazole-Trimethoprim     unknown  . Sulfonamide Derivatives     REACTION: rash  . Nitrofurantoin     Unknown. Macrobid.     Medications:  Prior to Admission:  Medications Prior to Admission  Medication Sig Dispense Refill Last Dose  . acetaminophen (TYLENOL) 325 MG tablet Take 2 tablets (650 mg total) by mouth every 4 (four) hours as needed for mild pain (or temp > 37.5 C (99.5 F)).   08/15/2017 at Unknown time  . Ascorbic Acid (VITAMIN C PO) Take 1 tablet by mouth daily.    Past Week at Unknown time  . aspirin EC 81 MG tablet Take 1  tablet (81 mg total) by mouth daily. 90 tablet 3 08/15/2017 at Unknown time  . atorvastatin (LIPITOR) 20 MG tablet TAKE 1 TABLET DAILY AT 6 P.M. 90 tablet 2 08/15/2017 at Unknown time  . calcium carbonate 200 MG capsule Take 200 mg by mouth daily.    Past Week at Unknown time  . Cholecalciferol (VITAMIN D PO) Take 1 tablet by mouth daily.    Past Week at Unknown time  . Cyanocobalamin (VITAMIN B 12 PO) Take 1 tablet by mouth daily.    Past Week at Unknown time  . fish oil-omega-3 fatty acids 1000 MG capsule Take 1 capsule by mouth daily.    Past Week at Unknown time  . gabapentin (NEURONTIN) 100 MG capsule Take 1 capsule (100 mg total) by mouth  at bedtime. Toe pain 90 capsule 1 08/15/2017 at Unknown time  . metoprolol tartrate (LOPRESSOR) 25 MG tablet TAKE ONE-HALF (1/2) TABLET TWICE A DAY 90 tablet 1 08/16/2017 at 1000  . Multiple Vitamins-Minerals (MULTIVITAMIN WITH MINERALS) tablet Take 1 tablet by mouth daily.   Past Week at Unknown time  . pantoprazole (PROTONIX) 20 MG tablet TAKE 1 TABLET EVERY OTHER DAY (Patient taking differently: Take 0.5 tablet (10 mg) by mouth every other day) 90 tablet 2 Past Week at Unknown time  . polyethylene glycol (MIRALAX / GLYCOLAX) packet Take 17 g by mouth daily as needed for moderate constipation.   Past Week at Unknown time  . VITAMIN E PO Take 1 tablet by mouth daily.    Past Week at Unknown time  . docusate sodium (COLACE) 100 MG capsule Take 1 capsule (100 mg total) by mouth 2 (two) times daily. (Patient taking differently: Take 100 mg by mouth 2 (two) times daily as needed for mild constipation. ) 10 capsule 0 unknown  . glucose blood test strip Use once daily 100 each 3 Taking  . mirtazapine (REMERON) 7.5 MG tablet Take 1 tablet (7.5 mg total) by mouth at bedtime. (Patient not taking: Reported on 08/16/2017) 90 tablet 3 Completed Course at Unknown time  . ONE TOUCH LANCETS MISC Use daily as directed 200 each 3 Taking   Scheduled: . atorvastatin  20 mg Oral q1800   . feeding supplement (ENSURE ENLIVE)  237 mL Oral TID BM  . insulin aspart  0-9 Units Subcutaneous TID WC  . metoprolol tartrate  25 mg Oral BID  . pantoprazole  20 mg Oral QODAY   Continuous: . lactated ringers Stopped (08/20/17 1034)   Anti-infectives (From admission, onward)   None      Results for orders placed or performed during the hospital encounter of 08/16/17 (from the past 48 hour(s))  Glucose, capillary     Status: Abnormal   Collection Time: 08/18/17 12:01 PM  Result Value Ref Range   Glucose-Capillary 176 (H) 70 - 99 mg/dL  Hemoglobin and hematocrit, blood     Status: Abnormal   Collection Time: 08/18/17 12:39 PM  Result Value Ref Range   Hemoglobin 9.3 (L) 12.0 - 15.0 g/dL   HCT 30.4 (L) 36.0 - 46.0 %    Comment: Performed at [facility], [place] [address] [place], [place] [address]  Glucose, capillary     Status: Abnormal   Collection Time: 08/18/17  4:09 PM  Result Value Ref Range   Glucose-Capillary 201 (H) 70 - 99 mg/dL  Glucose, capillary     Status: Abnormal   Collection Time: 08/18/17  8:51 PM  Result Value Ref Range   Glucose-Capillary 150 (H) 70 - 99 mg/dL  Glucose, capillary     Status: Abnormal   Collection Time: 08/19/17  6:42 AM  Result Value Ref Range   Glucose-Capillary 160 (H) 70 - 99 mg/dL  Hemoglobin and hematocrit, blood     Status: Abnormal   Collection Time: 08/19/17  7:11 AM  Result Value Ref Range   Hemoglobin 9.2 (L) 12.0 - 15.0 g/dL   HCT 29.3 (L) 36.0 - 46.0 %    Comment: Performed at [facility], [place] [address] [place], [place] [address]  Glucose, capillary     Status: Abnormal   Collection Time: 08/19/17  7:36 AM  Result Value Ref Range   Glucose-Capillary 158 (H) 70 - 99 mg/dL  Glucose, capillary  Status: Abnormal   Collection Time: 08/19/17 11:34 AM  Result Value Ref Range   Glucose-Capillary 230 (H) 70 - 99 mg/dL  Glucose, capillary     Status: Abnormal   Collection Time:  08/19/17  5:19 PM  Result Value Ref Range   Glucose-Capillary 104 (H) 70 - 99 mg/dL  Glucose, capillary     Status: Abnormal   Collection Time: 08/19/17  9:39 PM  Result Value Ref Range   Glucose-Capillary 151 (H) 70 - 99 mg/dL  Basic metabolic panel     Status: Abnormal   Collection Time: 08/20/17  5:23 AM  Result Value Ref Range   Sodium 141 135 - 145 mmol/L   Potassium 3.8 3.5 - 5.1 mmol/L   Chloride 108 98 - 111 mmol/L    Comment: Please note change in reference range.   CO2 24 22 - 32 mmol/L   Glucose, Bld 122 (H) 70 - 99 mg/dL    Comment: Please note change in reference range.   BUN 22 8 - 23 mg/dL    Comment: Please note change in reference range.   Creatinine, Ser 0.85 0.44 - 1.00 mg/dL   Calcium 8.2 (L) 8.9 - 10.3 mg/dL   GFR calc non Af Amer 56 (L) >60 mL/min   GFR calc Af Amer >60 >60 mL/min    Comment: (NOTE) The eGFR has been calculated using the CKD EPI equation. This calculation has not been validated in all clinical situations. eGFR's persistently <60 mL/min signify possible Chronic Kidney Disease.    Anion gap 9 5 - 15    Comment: Performed at [facility], [place] [address] [place], [place] [address]  CBC     Status: Abnormal   Collection Time: 08/20/17  5:23 AM  Result Value Ref Range   WBC 8.7 4.0 - 10.5 K/uL   RBC 2.83 (L) 3.87 - 5.11 MIL/uL   Hemoglobin 8.0 (L) 12.0 - 15.0 g/dL   HCT 25.4 (L) 36.0 - 46.0 %   MCV 89.8 78.0 - 100.0 fL   MCH 28.3 26.0 - 34.0 pg   MCHC 31.5 30.0 - 36.0 g/dL   RDW 15.1 11.5 - 15.5 %   Platelets 268 150 - 400 K/uL    Comment: Performed at [facility], [place] [address], [place], [place] [address]  Glucose, capillary     Status: Abnormal   Collection Time: 08/20/17  7:57 AM  Result Value Ref Range   Glucose-Capillary 116 (H) 70 - 99 mg/dL    No results found.  Review of Systems  Constitutional: Positive for malaise/fatigue.  HENT: Negative.   Eyes: Negative.   Respiratory:  Negative.   Cardiovascular: Negative.   Gastrointestinal: Positive for abdominal pain (some abdominal pain RLQ on and off) and blood in stool.  Genitourinary: Negative.   Musculoskeletal: Negative.   Skin: Negative.   Neurological: Negative.   Endo/Heme/Allergies: Bruises/bleeds easily (she had been on Plavix but this was stopped for  her bleeding earlier.).  Psychiatric/Behavioral: Negative.    Blood pressure (!) 125/42, pulse 72, temperature (!) 97.5 F (36.4 C), temperature source Oral, resp. rate 17, height 5' 6.5" (1.689 m), weight 49.9 kg (110 lb), SpO2 98 %. Physical Exam  Constitutional: She is oriented to person, place, and time. No distress.  Thin elderly woman in no acute distress  HENT:  Head: Normocephalic.  Mouth/Throat: Oropharynx is clear and moist. No oropharyngeal exudate.  Eyes: Right eye exhibits no discharge. Left eye exhibits no discharge.  No scleral icterus.  Pupils are equal  Neck: Normal range of motion. Neck supple. No JVD present. No tracheal deviation present. No thyromegaly present.  Cardiovascular: Normal rate, regular rhythm, normal heart sounds and intact distal pulses.  No murmur heard. Respiratory: Effort normal. No respiratory distress. She has no wheezes. Rales: rales right base. She exhibits no tenderness.  GI: Soft. Bowel sounds are normal. She exhibits no distension and no mass. There is tenderness (some tenderness Right side, mid abdomen). There is no rebound and no guarding.  Musculoskeletal: She exhibits no edema or tenderness.  Lymphadenopathy:    She has no cervical adenopathy.  Neurological: She is alert and oriented to person, place, and time. No cranial nerve deficit.  Skin: Skin is warm and dry. No rash noted. She is not diaphoretic. No erythema. No pallor.  Psychiatric: She has a normal mood and affect. Her behavior is normal. Judgment and thought content normal.    Assessment/Plan: Ascending and distal descending colon  masses Possible intraabdominal metastasis Anemia secondary to bleeding from the colon mass CAD/Hx of PAF/chronic diastolic dysfunction/LBBB Hx of TIA/hx of syncope Hx of Carotid dz -Left CEA 11/2010 Type II diabetes Hypertension L2 compression fracture  Plan:  Dr. Hassell Done has reviewed the CT, and colonoscopy report.  It was his opinion that if the patient and family want to proceed with surgery; he could offer a partial colectomy.  That would hopefully allow him to put her back together, but could possibly have a colostomy.  It would be a palliative procedure and not curative.  It would allow her to eventually eat and have bowel function. We did not speculate on how long or difficult recovery would be.  Nor did we suggest it was a better option than non surgical treatment. Pt has talked with Palliative and we have ask them to come back and discuss with the patient and family today.  If she wishes to go forward with surgery we will try to do her tomorrow.  I will up date her labs, recheck CXR, and be sure she has a Type and screen so she can get more blood if that is needed.  She is on clears with a fair prep per Dr. Woodward Ku colonoscopy report.  If she decides to go forward with surgery we can finish the permit in the AM, and we will give her an antibiotic pre op.     [name] 08/20/2017, 10:52 AM

## 2017-08-20 NOTE — Anesthesia Procedure Notes (Addendum)
Procedure Name: MAC Date/Time: 08/20/2017 9:33 AM Performed by: West Pugh, CRNA Pre-anesthesia Checklist: Patient identified, Emergency Drugs available, Suction available, Patient being monitored and Timeout performed Patient Re-evaluated:Patient Re-evaluated prior to induction Oxygen Delivery Method: Nasal cannula Induction Type: IV induction Number of attempts: 1 Placement Confirmation: positive ETCO2

## 2017-08-20 NOTE — H&P (View-Only) (Signed)
Reason for Consult:  Obstructing colon cancer Referring Physician: Dr. Pat Kocher Chief complaint:  weakness and fatigue PCP:  Desiree Post, MD  [name] is an 84 y.o. female.   HPI: Patient is a 84 year old female with multiple medical issues who presented on 08/16/2017 with complaints of weakness and fatigue.  Symptoms started several months ago and has become worse over the last couple weeks.  She has been seen by her PCP multiple times before and has been anemia.  She has a history of chronic anemia.  On 08/13/2017 hemoglobin 7.5, hematocrit 24.  She was seen by Dr. Elease Hashimoto on 08/15/17, and he talked to the hospitalist.  They recommended an ED evaluation.  She was transferred to the ED on 08/16/2017 by EMS and admitted with blood loss anemia.  A CT with contrast was obtained on 08/16/2017 in the ED.  Stools were positive for blood.  CT showed a large annular 4.7 x 3.5 x 7.5 cm colonic mass involving the ascending colon highly suspicious for malignancy. Some nonspecific pericolonic fat haziness which could be due to tumor or inflammatory process.  Central mesenteric and portal lymphadenopathy suspicious for metastatic disease.  Cholelithiasis with no evidence for acute cholecystitis.  L2 vertebral compression fractures chronic atherosclerotic disease.  She was transfused in the ED and seen by Dr. Owens Loffler [facility].  On 08/18/2017 she was seen by Dr. Alen Blew palliative care and he reports she would like to proceed with a colonoscopy for a diagnostic evaluation.  At that point she was not sure she was willing to pursue surgical intervention or chemotherapy.  Today she underwent colonoscopy by Dr. Silverio Decamp, colonoscopy shows a large obstructing mass in the ascending colon.  It was a circumferential mass and was oozing, biopsies were obtained site was injected with carbon black.  A second infiltrative polypoid ulcerated medium-sized mass was found in the distal descending colon the mass was  noncircumferential, 5 cm in length.  Its diameter measured 30 mm.  There is no bleeding present.  Biopsies were taken and it was tattooed with carbon black also.  There were multiple large and small diverticuli in the sigmoid colon.  We are asked to see.  Last colonoscopy I can see on her is in July 2011.     Past Medical History:  Diagnosis Date  . ANEMIA, NORMOCYTIC 11/30/2009  . CAD (coronary artery disease) 04/27/2008   Non-Obstructive >> Cardiac catheterization 6/08: EF 55%, 1+ MR, Dx 50%, LAD 50%, dLAD 40%, OM1 50-60% (up to 70%), pOM 30%, pRCA 40%, mRCA 50-60%, pPDA 50-60% (up to 70%).  Medical therapy was recommended // MV 11/14: Normal stress nuclear study. LV Ejection Fraction: 67%.   2D echo 02/12/7508: Normal systolic function, EF 60 to 65% normal wall motion, 2 diastolic dysfunction, mild calcified annulus, mitral valve shows some mild to moderate MR, left atrium was moderately dilated.   . Carotid artery disease ([place])    Left CEA in 10/12.  Carotid dopplers (10/15) with 1-39% bilateral ICA stenosis. // Carotid US 10/17: R < 40; patent L CEA site  . COLONIC POLYPS, ADENOMATOUS 12/26/2009  . DIABETES MELLITUS, TYPE II 08/11/2006  . DIVERTICULOSIS, COLON 08/11/2006  . GERD 02/16/2007  . Hemorrhoids    s/p banding  . History of echocardiogram    Echo 5/18: EF 60-65, no RWMA, Gr 2 DD, mild-mod MR, mod LAE // Echo 2/17: Mild LVH, mild focal basal septal hypertrophy, EF 55-60%, no RWMA, Gr 1 DD, MAC, mild  MR, mild  // Echo 4/14: EF 60-65%, normal wall motion, Gr 1 DD, MAC, mild MR, PASP 32, reduced excursion of AV noncoronary cusp.    . HYPERLIPIDEMIA 09/09/2006  . HYPERTENSION 09/09/2006  . LEFT BUNDLE BRANCH BLOCK 08/11/2006  . OSTEOARTHRITIS 08/11/2006  . OSTEOPOROSIS 08/11/2006  . PAF (paroxysmal atrial fibrillation) (HCC) 04/12/2009   Paroxysmal, only prior documented episode was years ago.  She was not started on anticoagulation back then due to hemorrhoidal bleeding, apparently  profuse.  Event monitor (3/14) showed only NSR.    . Stroke (HCC) 12-06-13  . Syncope    8/15. EEG (5/15) was unremarkable. // 4/18: ILR neg for arrhythmia - ? orthostatic (no prodrome reported) >> c/b SAH (small)   . TIA 09/02/2006   Echocardiogram 6/12: Mild to moderate MR, trace AI, trace TR, PASP 34, bubble study negative for intracardiac shunting, normal EF (greater than 55%) //  Possible TIA in 10/15 with visual field cut.  MRI (10/15) with no CVA. Linq monitor placed.    Past Surgical History:  Procedure Laterality Date  . CAROTID ENDARTERECTOMY Left 12-05-2010  . CLOSED REDUCTION METATARSAL FRACTURE     right 5th  . COLONOSCOPY W/ POLYPECTOMY  2001  . EYE SURGERY     catarac  . LOOP RECORDER IMPLANT N/A 01/03/2014   Procedure: LOOP RECORDER IMPLANT;  Surgeon: Gregg W Taylor, MD;  Location: [facility]ATH [facility];  Service: Cardiovascular;  Laterality: N/A;  . TONSILLECTOMY      Family History  Problem Relation Age of Onset  . Heart disease Mother 72  . Kidney disease Mother   . Heart attack Mother   . Congestive Heart Failure Father 93  . Colon cancer Brother   . Heart disease Brother   . Breast cancer Sister   . Uterine cancer Sister     Social History:  reports that she has never smoked. She has never used smokeless tobacco. She reports that she does not drink alcohol or use drugs.  Allergies:  Allergies  Allergen Reactions  . Sulfamethoxazole-Trimethoprim     unknown  . Sulfonamide Derivatives     REACTION: rash  . Nitrofurantoin     Unknown. Macrobid.     Medications:  Prior to Admission:  Medications Prior to Admission  Medication Sig Dispense Refill Last Dose  . acetaminophen (TYLENOL) 325 MG tablet Take 2 tablets (650 mg total) by mouth every 4 (four) hours as needed for mild pain (or temp > 37.5 C (99.5 F)).   08/15/2017 at Unknown time  . Ascorbic Acid (VITAMIN C PO) Take 1 tablet by mouth daily.    Past Week at Unknown time  . aspirin EC 81 MG tablet Take 1  tablet (81 mg total) by mouth daily. 90 tablet 3 08/15/2017 at Unknown time  . atorvastatin (LIPITOR) 20 MG tablet TAKE 1 TABLET DAILY AT 6 P.M. 90 tablet 2 08/15/2017 at Unknown time  . calcium carbonate 200 MG capsule Take 200 mg by mouth daily.    Past Week at Unknown time  . Cholecalciferol (VITAMIN D PO) Take 1 tablet by mouth daily.    Past Week at Unknown time  . Cyanocobalamin (VITAMIN B 12 PO) Take 1 tablet by mouth daily.    Past Week at Unknown time  . fish oil-omega-3 fatty acids 1000 MG capsule Take 1 capsule by mouth daily.    Past Week at Unknown time  . gabapentin (NEURONTIN) 100 MG capsule Take 1 capsule (100 mg total) by mouth   at bedtime. Toe pain 90 capsule 1 08/15/2017 at Unknown time  . metoprolol tartrate (LOPRESSOR) 25 MG tablet TAKE ONE-HALF (1/2) TABLET TWICE A DAY 90 tablet 1 08/16/2017 at 1000  . Multiple Vitamins-Minerals (MULTIVITAMIN WITH MINERALS) tablet Take 1 tablet by mouth daily.   Past Week at Unknown time  . pantoprazole (PROTONIX) 20 MG tablet TAKE 1 TABLET EVERY OTHER DAY (Patient taking differently: Take 0.5 tablet (10 mg) by mouth every other day) 90 tablet 2 Past Week at Unknown time  . polyethylene glycol (MIRALAX / GLYCOLAX) packet Take 17 g by mouth daily as needed for moderate constipation.   Past Week at Unknown time  . VITAMIN E PO Take 1 tablet by mouth daily.    Past Week at Unknown time  . docusate sodium (COLACE) 100 MG capsule Take 1 capsule (100 mg total) by mouth 2 (two) times daily. (Patient taking differently: Take 100 mg by mouth 2 (two) times daily as needed for mild constipation. ) 10 capsule 0 unknown  . glucose blood test strip Use once daily 100 each 3 Taking  . mirtazapine (REMERON) 7.5 MG tablet Take 1 tablet (7.5 mg total) by mouth at bedtime. (Patient not taking: Reported on 08/16/2017) 90 tablet 3 Completed Course at Unknown time  . ONE TOUCH LANCETS MISC Use daily as directed 200 each 3 Taking   Scheduled: . atorvastatin  20 mg Oral q1800   . feeding supplement (ENSURE ENLIVE)  237 mL Oral TID BM  . insulin aspart  0-9 Units Subcutaneous TID WC  . metoprolol tartrate  25 mg Oral BID  . pantoprazole  20 mg Oral QODAY   Continuous: . lactated ringers Stopped (08/20/17 1034)   Anti-infectives (From admission, onward)   None      Results for orders placed or performed during the hospital encounter of 08/16/17 (from the past 48 hour(s))  Glucose, capillary     Status: Abnormal   Collection Time: 08/18/17 12:01 PM  Result Value Ref Range   Glucose-Capillary 176 (H) 70 - 99 mg/dL  Hemoglobin and hematocrit, blood     Status: Abnormal   Collection Time: 08/18/17 12:39 PM  Result Value Ref Range   Hemoglobin 9.3 (L) 12.0 - 15.0 g/dL   HCT 30.4 (L) 36.0 - 46.0 %    Comment: Performed at [facility], [place] [address] [place], [place] [address]  Glucose, capillary     Status: Abnormal   Collection Time: 08/18/17  4:09 PM  Result Value Ref Range   Glucose-Capillary 201 (H) 70 - 99 mg/dL  Glucose, capillary     Status: Abnormal   Collection Time: 08/18/17  8:51 PM  Result Value Ref Range   Glucose-Capillary 150 (H) 70 - 99 mg/dL  Glucose, capillary     Status: Abnormal   Collection Time: 08/19/17  6:42 AM  Result Value Ref Range   Glucose-Capillary 160 (H) 70 - 99 mg/dL  Hemoglobin and hematocrit, blood     Status: Abnormal   Collection Time: 08/19/17  7:11 AM  Result Value Ref Range   Hemoglobin 9.2 (L) 12.0 - 15.0 g/dL   HCT 29.3 (L) 36.0 - 46.0 %    Comment: Performed at [facility], [place] [address] [place], [place] [address]  Glucose, capillary     Status: Abnormal   Collection Time: 08/19/17  7:36 AM  Result Value Ref Range   Glucose-Capillary 158 (H) 70 - 99 mg/dL  Glucose, capillary  Status: Abnormal   Collection Time: 08/19/17 11:34 AM  Result Value Ref Range   Glucose-Capillary 230 (H) 70 - 99 mg/dL  Glucose, capillary     Status: Abnormal   Collection Time:  08/19/17  5:19 PM  Result Value Ref Range   Glucose-Capillary 104 (H) 70 - 99 mg/dL  Glucose, capillary     Status: Abnormal   Collection Time: 08/19/17  9:39 PM  Result Value Ref Range   Glucose-Capillary 151 (H) 70 - 99 mg/dL  Basic metabolic panel     Status: Abnormal   Collection Time: 08/20/17  5:23 AM  Result Value Ref Range   Sodium 141 135 - 145 mmol/L   Potassium 3.8 3.5 - 5.1 mmol/L   Chloride 108 98 - 111 mmol/L    Comment: Please note change in reference range.   CO2 24 22 - 32 mmol/L   Glucose, Bld 122 (H) 70 - 99 mg/dL    Comment: Please note change in reference range.   BUN 22 8 - 23 mg/dL    Comment: Please note change in reference range.   Creatinine, Ser 0.85 0.44 - 1.00 mg/dL   Calcium 8.2 (L) 8.9 - 10.3 mg/dL   GFR calc non Af Amer 56 (L) >60 mL/min   GFR calc Af Amer >60 >60 mL/min    Comment: (NOTE) The eGFR has been calculated using the CKD EPI equation. This calculation has not been validated in all clinical situations. eGFR's persistently <60 mL/min signify possible Chronic Kidney Disease.    Anion gap 9 5 - 15    Comment: Performed at [facility], [place] [address], [place], [place] [address]  CBC     Status: Abnormal   Collection Time: 08/20/17  5:23 AM  Result Value Ref Range   WBC 8.7 4.0 - 10.5 K/uL   RBC 2.83 (L) 3.87 - 5.11 MIL/uL   Hemoglobin 8.0 (L) 12.0 - 15.0 g/dL   HCT 25.4 (L) 36.0 - 46.0 %   MCV 89.8 78.0 - 100.0 fL   MCH 28.3 26.0 - 34.0 pg   MCHC 31.5 30.0 - 36.0 g/dL   RDW 15.1 11.5 - 15.5 %   Platelets 268 150 - 400 K/uL    Comment: Performed at [facility], [place] [address], [place], [place] [address]  Glucose, capillary     Status: Abnormal   Collection Time: 08/20/17  7:57 AM  Result Value Ref Range   Glucose-Capillary 116 (H) 70 - 99 mg/dL    No results found.  Review of Systems  Constitutional: Positive for malaise/fatigue.  HENT: Negative.   Eyes: Negative.   Respiratory:  Negative.   Cardiovascular: Negative.   Gastrointestinal: Positive for abdominal pain (some abdominal pain RLQ on and off) and blood in stool.  Genitourinary: Negative.   Musculoskeletal: Negative.   Skin: Negative.   Neurological: Negative.   Endo/Heme/Allergies: Bruises/bleeds easily (she had been on Plavix but this was stopped for  her bleeding earlier.).  Psychiatric/Behavioral: Negative.    Blood pressure (!) 125/42, pulse 72, temperature (!) 97.5 F (36.4 C), temperature source Oral, resp. rate 17, height 5' 6.5" (1.689 m), weight 49.9 kg (110 lb), SpO2 98 %. Physical Exam  Constitutional: She is oriented to person, place, and time. No distress.  Thin elderly woman in no acute distress  HENT:  Head: Normocephalic.  Mouth/Throat: Oropharynx is clear and moist. No oropharyngeal exudate.  Eyes: Right eye exhibits no discharge. Left eye exhibits no discharge.  No scleral icterus.  Pupils are equal  Neck: Normal range of motion. Neck supple. No JVD present. No tracheal deviation present. No thyromegaly present.  Cardiovascular: Normal rate, regular rhythm, normal heart sounds and intact distal pulses.  No murmur heard. Respiratory: Effort normal. No respiratory distress. She has no wheezes. Rales: rales right base. She exhibits no tenderness.  GI: Soft. Bowel sounds are normal. She exhibits no distension and no mass. There is tenderness (some tenderness Right side, mid abdomen). There is no rebound and no guarding.  Musculoskeletal: She exhibits no edema or tenderness.  Lymphadenopathy:    She has no cervical adenopathy.  Neurological: She is alert and oriented to person, place, and time. No cranial nerve deficit.  Skin: Skin is warm and dry. No rash noted. She is not diaphoretic. No erythema. No pallor.  Psychiatric: She has a normal mood and affect. Her behavior is normal. Judgment and thought content normal.    Assessment/Plan: Ascending and distal descending colon  masses Possible intraabdominal metastasis Anemia secondary to bleeding from the colon mass CAD/Hx of PAF/chronic diastolic dysfunction/LBBB Hx of TIA/hx of syncope Hx of Carotid dz -Left CEA 11/2010 Type II diabetes Hypertension L2 compression fracture  Plan:  Dr. Hassell Done has reviewed the CT, and colonoscopy report.  It was his opinion that if the patient and family want to proceed with surgery; he could offer a partial colectomy.  That would hopefully allow him to put her back together, but could possibly have a colostomy.  It would be a palliative procedure and not curative.  It would allow her to eventually eat and have bowel function. We did not speculate on how long or difficult recovery would be.  Nor did we suggest it was a better option than non surgical treatment. Pt has talked with Palliative and we have ask them to come back and discuss with the patient and family today.  If she wishes to go forward with surgery we will try to do her tomorrow.  I will up date her labs, recheck CXR, and be sure she has a Type and screen so she can get more blood if that is needed.  She is on clears with a fair prep per Dr. Woodward Ku colonoscopy report.  If she decides to go forward with surgery we can finish the permit in the AM, and we will give her an antibiotic pre op.     Navpreet Szczygiel 08/20/2017, 10:52 AM

## 2017-08-21 ENCOUNTER — Inpatient Hospital Stay (HOSPITAL_COMMUNITY): Payer: Medicare Other | Admitting: Registered Nurse

## 2017-08-21 ENCOUNTER — Encounter (HOSPITAL_COMMUNITY)
Admission: EM | Disposition: A | Discharge status: Skilled Nursing Facility | Payer: Self-pay | Source: Home / Self Care | Attending: Family Medicine

## 2017-08-21 ENCOUNTER — Encounter (HOSPITAL_COMMUNITY): Payer: Self-pay | Admitting: Gastroenterology

## 2017-08-21 HISTORY — PX: LAPAROSCOPIC PARTIAL COLECTOMY: SHX5907

## 2017-08-21 LAB — CEA: CEA1: 31.7 ng/mL — AB (ref 0.0–4.7)

## 2017-08-21 LAB — SURGICAL PCR SCREEN
MRSA, PCR: NEGATIVE
Staphylococcus aureus: NEGATIVE

## 2017-08-21 LAB — PREPARE RBC (CROSSMATCH)

## 2017-08-21 LAB — CBC
HCT: 25.8 % — ABNORMAL LOW (ref 36.0–46.0)
HEMOGLOBIN: 8.1 g/dL — AB (ref 12.0–15.0)
MCH: 28.1 pg (ref 26.0–34.0)
MCHC: 31.4 g/dL (ref 30.0–36.0)
MCV: 89.6 fL (ref 78.0–100.0)
Platelets: 259 10*3/uL (ref 150–400)
RBC: 2.88 MIL/uL — ABNORMAL LOW (ref 3.87–5.11)
RDW: 15.1 % (ref 11.5–15.5)
WBC: 9.1 10*3/uL (ref 4.0–10.5)

## 2017-08-21 LAB — GLUCOSE, CAPILLARY
GLUCOSE-CAPILLARY: 112 mg/dL — AB (ref 70–99)
GLUCOSE-CAPILLARY: 151 mg/dL — AB (ref 70–99)
GLUCOSE-CAPILLARY: 161 mg/dL — AB (ref 70–99)
GLUCOSE-CAPILLARY: 156 mg/dL — AB (ref 70–99)
Glucose-Capillary: 118 mg/dL — ABNORMAL HIGH (ref 70–99)

## 2017-08-21 LAB — BASIC METABOLIC PANEL
Anion gap: 7 (ref 5–15)
BUN: 15 mg/dL (ref 8–23)
CALCIUM: 8.2 mg/dL — AB (ref 8.9–10.3)
CO2: 25 mmol/L (ref 22–32)
Chloride: 105 mmol/L (ref 98–111)
Creatinine, Ser: 0.77 mg/dL (ref 0.44–1.00)
GFR calc Af Amer: >60 mL/min (ref >60)
GFR calc non Af Amer: >60 mL/min (ref >60)
GLUCOSE: 124 mg/dL — AB (ref 70–99)
POTASSIUM: 4.3 mmol/L (ref 3.5–5.1)
Sodium: 137 mmol/L (ref 135–145)

## 2017-08-21 LAB — HEMOGLOBIN A1C
Hgb A1c MFr Bld: 6.4 % — ABNORMAL HIGH (ref 4.8–5.6)
Mean Plasma Glucose: 136.98 mg/dL

## 2017-08-21 LAB — PREALBUMIN: Prealbumin: 8.6 mg/dL — ABNORMAL LOW (ref 18–38)

## 2017-08-21 LAB — PROTIME-INR
INR: 1.15
PROTHROMBIN TIME: 14.6 s (ref 11.4–15.2)

## 2017-08-21 LAB — APTT: aPTT: 31 seconds (ref 24–36)

## 2017-08-21 SURGERY — LAPAROSCOPIC PARTIAL COLECTOMY
Anesthesia: General | Laterality: Right

## 2017-08-21 MED ORDER — ONDANSETRON HCL 4 MG/2ML IJ SOLN
INTRAMUSCULAR | Status: AC
  Filled 2017-08-21: qty 2

## 2017-08-21 MED ORDER — METOPROLOL TARTRATE 5 MG/5ML IV SOLN
2.5000 mg | Freq: Four times a day (QID) | INTRAVENOUS | Status: DC
Start: 2017-08-21 — End: 2017-08-24
  Administered 2017-08-21 – 2017-08-24 (×10): 2.5 mg via INTRAVENOUS
  Filled 2017-08-21 (×10): qty 5

## 2017-08-21 MED ORDER — FENTANYL CITRATE (PF) 100 MCG/2ML IJ SOLN
INTRAMUSCULAR | Status: AC
  Filled 2017-08-21: qty 2

## 2017-08-21 MED ORDER — FENTANYL CITRATE (PF) 100 MCG/2ML IJ SOLN
INTRAMUSCULAR | Status: DC | PRN
  Administered 2017-08-21: 25 ug via INTRAVENOUS
  Administered 2017-08-21 (×3): 50 ug via INTRAVENOUS
  Administered 2017-08-21: 25 ug via INTRAVENOUS

## 2017-08-21 MED ORDER — PROPOFOL 10 MG/ML IV BOLUS
INTRAVENOUS | Status: DC | PRN
  Administered 2017-08-21: 120 mg via INTRAVENOUS

## 2017-08-21 MED ORDER — SUGAMMADEX SODIUM 200 MG/2ML IV SOLN
INTRAVENOUS | Status: DC | PRN
  Administered 2017-08-21: 100 mg via INTRAVENOUS

## 2017-08-21 MED ORDER — EPHEDRINE SULFATE-NACL 50-0.9 MG/10ML-% IV SOSY
PREFILLED_SYRINGE | INTRAVENOUS | Status: DC | PRN
  Administered 2017-08-21: 10 mg via INTRAVENOUS

## 2017-08-21 MED ORDER — DEXAMETHASONE SODIUM PHOSPHATE 10 MG/ML IJ SOLN
INTRAMUSCULAR | Status: AC
  Filled 2017-08-21: qty 1

## 2017-08-21 MED ORDER — LABETALOL HCL 5 MG/ML IV SOLN
INTRAVENOUS | Status: AC
  Filled 2017-08-21: qty 4

## 2017-08-21 MED ORDER — DEXAMETHASONE SODIUM PHOSPHATE 10 MG/ML IJ SOLN
INTRAMUSCULAR | Status: DC | PRN
  Administered 2017-08-21: 10 mg via INTRAVENOUS

## 2017-08-21 MED ORDER — ACETAMINOPHEN 10 MG/ML IV SOLN
1000.0000 mg | Freq: Four times a day (QID) | INTRAVENOUS | Status: AC
  Administered 2017-08-21 – 2017-08-22 (×4): 1000 mg via INTRAVENOUS
  Filled 2017-08-21 (×4): qty 100

## 2017-08-21 MED ORDER — LIDOCAINE 2% (20 MG/ML) 5 ML SYRINGE
INTRAMUSCULAR | Status: AC
Start: 2017-08-21 — End: ?
  Filled 2017-08-21: qty 5

## 2017-08-21 MED ORDER — FENTANYL CITRATE (PF) 100 MCG/2ML IJ SOLN
25.0000 ug | INTRAMUSCULAR | Status: DC | PRN
  Administered 2017-08-21 (×3): 25 ug via INTRAVENOUS

## 2017-08-21 MED ORDER — MEPERIDINE HCL 50 MG/ML IJ SOLN: 6.2500 mg | INTRAMUSCULAR | Status: DC | PRN

## 2017-08-21 MED ORDER — ORAL CARE MOUTH RINSE
15.0000 mL | Freq: Two times a day (BID) | OROMUCOSAL | Status: DC
  Administered 2017-08-21 – 2017-08-26 (×8): 15 mL via OROMUCOSAL

## 2017-08-21 MED ORDER — LACTATED RINGERS IR SOLN
Status: DC | PRN
  Administered 2017-08-21: 1000 mL

## 2017-08-21 MED ORDER — LIDOCAINE 2% (20 MG/ML) 5 ML SYRINGE
INTRAMUSCULAR | Status: DC | PRN
  Administered 2017-08-21: 1 mg/kg/h via INTRAVENOUS

## 2017-08-21 MED ORDER — LACTATED RINGERS IV SOLN
INTRAVENOUS | Status: DC
  Administered 2017-08-21: 10:00:00 via INTRAVENOUS

## 2017-08-21 MED ORDER — METOCLOPRAMIDE HCL 5 MG/ML IJ SOLN: 10.0000 mg | Freq: Once | INTRAMUSCULAR | Status: DC | PRN

## 2017-08-21 MED ORDER — SODIUM CHLORIDE 0.9 % IV SOLN
INTRAVENOUS | Status: DC | PRN
  Administered 2017-08-21: 13:00:00 via INTRAVENOUS

## 2017-08-21 MED ORDER — LIDOCAINE 2% (20 MG/ML) 5 ML SYRINGE
INTRAMUSCULAR | Status: DC | PRN
  Administered 2017-08-21: 100 mg via INTRAVENOUS

## 2017-08-21 MED ORDER — LABETALOL HCL 5 MG/ML IV SOLN
5.0000 mg | INTRAVENOUS | Status: DC | PRN
  Administered 2017-08-21: 5 mg via INTRAVENOUS

## 2017-08-21 MED ORDER — MORPHINE SULFATE (PF) 2 MG/ML IV SOLN: 2.0000 mg | INTRAVENOUS | Status: DC | PRN

## 2017-08-21 MED ORDER — PROPOFOL 10 MG/ML IV BOLUS
INTRAVENOUS | Status: AC
  Filled 2017-08-21: qty 20

## 2017-08-21 MED ORDER — SUGAMMADEX SODIUM 200 MG/2ML IV SOLN
INTRAVENOUS | Status: AC
  Filled 2017-08-21: qty 2

## 2017-08-21 MED ORDER — BUPIVACAINE LIPOSOME 1.3 % IJ SUSP
20.0000 mL | Freq: Once | INTRAMUSCULAR | Status: AC
  Administered 2017-08-21: 20 mL
  Filled 2017-08-21: qty 20

## 2017-08-21 MED ORDER — ROCURONIUM BROMIDE 10 MG/ML (PF) SYRINGE
PREFILLED_SYRINGE | INTRAVENOUS | Status: DC | PRN
  Administered 2017-08-21: 50 mg via INTRAVENOUS
  Administered 2017-08-21: 10 mg via INTRAVENOUS

## 2017-08-21 MED ORDER — INSULIN ASPART 100 UNIT/ML ~~LOC~~ SOLN
0.0000 [IU] | SUBCUTANEOUS | Status: DC
  Administered 2017-08-21 – 2017-08-22 (×2): 2 [IU] via SUBCUTANEOUS
  Administered 2017-08-22: 1 [IU] via SUBCUTANEOUS
  Administered 2017-08-22: 2 [IU] via SUBCUTANEOUS
  Administered 2017-08-23 (×4): 1 [IU] via SUBCUTANEOUS
  Administered 2017-08-24 (×2): 2 [IU] via SUBCUTANEOUS

## 2017-08-21 MED ORDER — ROCURONIUM BROMIDE 100 MG/10ML IV SOLN
INTRAVENOUS | Status: AC
  Filled 2017-08-21: qty 1

## 2017-08-21 MED ORDER — ONDANSETRON HCL 4 MG/2ML IJ SOLN
INTRAMUSCULAR | Status: DC | PRN
  Administered 2017-08-21: 4 mg via INTRAVENOUS

## 2017-08-21 MED ORDER — FAMOTIDINE IN NACL 20-0.9 MG/50ML-% IV SOLN
20.0000 mg | INTRAVENOUS | Status: DC
Start: 2017-08-21 — End: 2017-08-23
  Administered 2017-08-21 – 2017-08-22 (×2): 20 mg via INTRAVENOUS
  Filled 2017-08-21 (×2): qty 50

## 2017-08-21 MED ORDER — ALVIMOPAN 12 MG PO CAPS
12.0000 mg | ORAL_CAPSULE | Freq: Two times a day (BID) | ORAL | Status: DC
  Administered 2017-08-22 – 2017-08-23 (×3): 12 mg via ORAL
  Filled 2017-08-21 (×3): qty 1

## 2017-08-21 SURGICAL SUPPLY — 60 items
APPLIER CLIP 5 13 M/L LIGAMAX5 (MISCELLANEOUS)
APPLIER CLIP ROT 10 11.4 M/L (STAPLE)
APR CLP MED LRG 11.4X10 (STAPLE)
APR CLP MED LRG 5 ANG JAW (MISCELLANEOUS)
BLADE EXTENDED COATED 6.5IN (ELECTRODE) IMPLANT
CABLE HIGH FREQUENCY MONO STRZ (ELECTRODE) ×3 IMPLANT
CELLS DAT CNTRL 66122 CELL SVR (MISCELLANEOUS) IMPLANT
CHLORAPREP W/TINT 26ML (MISCELLANEOUS) ×5 IMPLANT
CLIP APPLIE 5 13 M/L LIGAMAX5 (MISCELLANEOUS) IMPLANT
CLIP APPLIE ROT 10 11.4 M/L (STAPLE) IMPLANT
COUNTER NEEDLE 20 DBL MAG RED (NEEDLE) ×3 IMPLANT
COVER MAYO STAND STRL (DRAPES) ×9 IMPLANT
COVER SURGICAL LIGHT HANDLE (MISCELLANEOUS) ×3 IMPLANT
DECANTER SPIKE VIAL GLASS SM (MISCELLANEOUS) ×1 IMPLANT
DRAPE LAPAROSCOPIC ABDOMINAL (DRAPES) ×3 IMPLANT
DRSG OPSITE POSTOP 4X10 (GAUZE/BANDAGES/DRESSINGS) IMPLANT
DRSG OPSITE POSTOP 4X6 (GAUZE/BANDAGES/DRESSINGS) IMPLANT
DRSG OPSITE POSTOP 4X8 (GAUZE/BANDAGES/DRESSINGS) IMPLANT
ELECT REM PT RETURN 15FT ADLT (MISCELLANEOUS) ×3 IMPLANT
GAUZE SPONGE 4X4 12PLY STRL (GAUZE/BANDAGES/DRESSINGS) IMPLANT
GLOVE BIOGEL M 8.0 STRL (GLOVE) ×18 IMPLANT
GOWN STRL REUS W/TWL XL LVL3 (GOWN DISPOSABLE) ×18 IMPLANT
HANDLE STAPLE EGIA 4 XL (STAPLE) ×2 IMPLANT
LEGGING LITHOTOMY PAIR STRL (DRAPES) IMPLANT
PACK COLON (CUSTOM PROCEDURE TRAY) ×3 IMPLANT
PAD POSITIONING PINK XL (MISCELLANEOUS) ×3 IMPLANT
PORT LAP GEL ALEXIS MED 5-9CM (MISCELLANEOUS) IMPLANT
POSITIONER SURGICAL ARM (MISCELLANEOUS) ×6 IMPLANT
RELOAD EGIA 60 MED/THCK PURPLE (STAPLE) ×3 IMPLANT
RELOAD EGIA 60 TAN VASC (STAPLE) ×2 IMPLANT
RELOAD STAPLE 60 MED/THCK ART (STAPLE) IMPLANT
RETRACTOR WND ALEXIS 18 MED (MISCELLANEOUS) IMPLANT
RTRCTR WOUND ALEXIS 18CM MED (MISCELLANEOUS)
SCISSORS LAP 5X45 EPIX DISP (ENDOMECHANICALS) ×3 IMPLANT
SET IRRIG TUBING LAPAROSCOPIC (IRRIGATION / IRRIGATOR) ×3 IMPLANT
SHEARS CURVED HARMONIC AC 45CM (MISCELLANEOUS) ×2 IMPLANT
SLEEVE XCEL OPT CAN 5 100 (ENDOMECHANICALS) ×6 IMPLANT
STAPLER VISISTAT 35W (STAPLE) ×1 IMPLANT
SUT CHROMIC 3 0 SH 27 (SUTURE) IMPLANT
SUT PDS AB 1 CTX 36 (SUTURE) IMPLANT
SUT PDS AB 1 TP1 96 (SUTURE) IMPLANT
SUT PDS AB 4-0 SH 27 (SUTURE) ×4 IMPLANT
SUT PROLENE 2 0 KS (SUTURE) IMPLANT
SUT SILK 2 0 (SUTURE) ×3
SUT SILK 2 0 SH CR/8 (SUTURE) ×3 IMPLANT
SUT SILK 2-0 18XBRD TIE 12 (SUTURE) ×1 IMPLANT
SUT SILK 3 0 (SUTURE) ×3
SUT SILK 3 0 SH CR/8 (SUTURE) ×5 IMPLANT
SUT SILK 3-0 18XBRD TIE 12 (SUTURE) ×1 IMPLANT
SUT VIC AB 4-0 SH 18 (SUTURE) ×1 IMPLANT
SYS LAPSCP GELPORT 120MM (MISCELLANEOUS)
SYSTEM LAPSCP GELPORT 120MM (MISCELLANEOUS) IMPLANT
TOWEL OR NON WOVEN STRL DISP B (DISPOSABLE) ×3 IMPLANT
TRAY FOLEY MTR SLVR 14FR STAT (SET/KITS/TRAYS/PACK) IMPLANT
TRAY FOLEY MTR SLVR 16FR STAT (SET/KITS/TRAYS/PACK) IMPLANT
TROCAR BLADELESS OPT 5 100 (ENDOMECHANICALS) ×3 IMPLANT
TROCAR XCEL NON-BLD 11X100MML (ENDOMECHANICALS) ×2 IMPLANT
TUBING CONNECTING 10 (TUBING) IMPLANT
TUBING CONNECTING 10' (TUBING)
TUBING INSUF HEATED (TUBING) ×3 IMPLANT

## 2017-08-21 NOTE — Progress Notes (Signed)
PMT no charge note  Patient seen briefly this am, being wheeled down for her surgery.   Wound care RN note reviewed Chart reviewed Discussed with [facility] MD Dr. Lonny Prude.   BP 140/65 (BP Location: Right Arm)   Pulse 95   Temp 98.4 F (36.9 C) (Oral)   Resp 18   Ht 5' 6.5" (1.689 m)   Wt 53.7 kg (118 lb 6.2 oz)   SpO2 96%   BMI 18.82 kg/m   Met with patient's family, 2 sons and other extended family members.  Patient in good spirits this am.   PMT to continue to follow along, follow hospital course, and help with providing supportive care, symptom management, discussions regarding next steps/goals of care and also to assist with appropriate disposition planning.   15 minutes spent.   Loistine Chance MD [phone] [facility] palliative medicine team

## 2017-08-21 NOTE — Anesthesia Postprocedure Evaluation (Signed)
Anesthesia Post Note  Patient: JADYN BRASHER  Procedure(s) Performed: LAPAROSCOPIC RIGHT COLECTOMY (Right )     Patient location during evaluation: PACU Anesthesia Type: General Level of consciousness: awake and alert Pain management: pain level controlled Vital Signs Assessment: post-procedure vital signs reviewed and stable Respiratory status: spontaneous breathing, nonlabored ventilation, respiratory function stable and patient connected to nasal cannula oxygen Cardiovascular status: blood pressure returned to baseline and stable Postop Assessment: no apparent nausea or vomiting Anesthetic complications: no    Last Vitals:  Vitals:   08/21/17 1530 08/21/17 1600  BP: 137/61 (!) 168/70  Pulse: 68 74  Resp: (!) 9 12  Temp: (!) 36.4 C (!) 36.1 C  SpO2: 96% 95%    Last Pain:  Vitals:   08/21/17 1515  TempSrc:   PainSc: Asleep                 Catalina Gravel

## 2017-08-21 NOTE — Interval H&P Note (Signed)
History and Physical Interval Note:  08/21/2017 10:55 AM  [name]  has presented today for surgery, with the diagnosis of colon mass  The various methods of treatment have been discussed with the patient and family. After consideration of risks, benefits and other options for treatment, the patient has consented to  Procedure(s): LAPAROSCOPIC PARTIAL COLECTOMY WITH POSSIBLE OSTOMY (N/A) as a surgical intervention .  The patient's history has been reviewed, patient examined, no change in status, stable for surgery.  I have reviewed the patient's chart and labs.  Questions were answered to the patient's satisfaction.     Pedro Earls

## 2017-08-21 NOTE — Anesthesia Preprocedure Evaluation (Signed)
Anesthesia Evaluation  Patient identified by MRN, date of birth, ID band Patient awake    Reviewed: Allergy & Precautions, H&P , NPO status , Patient's Chart, lab work & pertinent test results, reviewed documented beta blocker date and time   Airway Mallampati: II  TM Distance: >3 FB Neck ROM: full    Dental no notable dental hx.    Pulmonary    Pulmonary exam normal breath sounds clear to auscultation       Cardiovascular Exercise Tolerance: Good hypertension, + CAD, + Peripheral Vascular Disease and +CHF  + dysrhythmias Atrial Fibrillation  Rhythm:regular Rate:Normal  Echo 5/18: EF 60-65, no RWMA, Gr 2 DD, mild-mod MR, mod LAE //  Echo 2/17: Mild LVH, mild focal basal septal hypertrophy, EF 55-60%, no RWMA, Gr 1 DD, MAC, mild MR, mild  //  Echo 4/14: EF 60-65%, normal wall motion, Gr 1 DD, MAC, mild MR, PASP 32, reduced excursion of AV noncoronary cusp.     LEFT BUNDLE BRANCH BLOCK     Neuro/Psych Carotid artery disease    Left CEA in 10/12.  Carotid dopplers (10/15) with 1-39% bilateral ICA stenosis. // Carotid US 10/17: R < 40; patent L CEA site  TIA 09/02/2006  Echocardiogram 6/12: Mild to moderate MR, trace AI, trace TR, PASP 34, bubble study negative for intracardiac shunting, normal EF (greater than 55%) //  Possible TIA in 10/15 with visual field cut.  MRI (10/15) with no CVA. Linq monitor placed.   TIACVA    GI/Hepatic Neg liver ROS, GERD  ,  Endo/Other  negative endocrine ROSdiabetes  Renal/GU negative Renal ROS     Musculoskeletal  (+) Arthritis , Osteoarthritis,    Abdominal   Peds  Hematology  (+) anemia ,   Anesthesia Other Findings   Reproductive/Obstetrics                             Anesthesia Physical  Anesthesia Plan  ASA: III  Anesthesia Plan: General   Post-op Pain Management:    Induction:   PONV Risk Score and Plan: 4 or greater and Treatment may  vary due to age or medical condition and Ondansetron  Airway Management Planned: Oral ETT  Additional Equipment:   Intra-op Plan:   Post-operative Plan:   Informed Consent: I have reviewed the patients History and Physical, chart, labs and discussed the procedure including the risks, benefits and alternatives for the proposed anesthesia with the patient or authorized representative who has indicated his/her understanding and acceptance.   Dental Advisory Given and Dental advisory given  Plan Discussed with: CRNA, Anesthesiologist and Surgeon  Anesthesia Plan Comments:         Anesthesia Quick Evaluation

## 2017-08-21 NOTE — Anesthesia Procedure Notes (Signed)
Procedure Name: Intubation Date/Time: 08/21/2017 11:27 AM Performed by: Victoriano Lain, CRNA Pre-anesthesia Checklist: Patient identified, Emergency Drugs available, Suction available, Patient being monitored and Timeout performed Patient Re-evaluated:Patient Re-evaluated prior to induction Oxygen Delivery Method: Circle system utilized Preoxygenation: Pre-oxygenation with 100% oxygen Induction Type: IV induction Ventilation: Mask ventilation without difficulty Laryngoscope Size: Mac and 4 Grade View: Grade I Tube type: Oral Tube size: 7.0 mm Number of attempts: 1 Airway Equipment and Method: Stylet Placement Confirmation: ETT inserted through vocal cords under direct vision,  positive ETCO2 and breath sounds checked- equal and bilateral Secured at: 20 cm Tube secured with: Tape Dental Injury: Teeth and Oropharynx as per pre-operative assessment

## 2017-08-21 NOTE — Consult Note (Addendum)
[place] Nurse requested for preoperative stoma site marking.  Discussed surgical procedure and stoma creation with patient.  Explained role of the [facility] nurse team.  Provided the patient with educational booklet and provided samples of pouching options.  Answered patient's questions.  Examined patient lying and sitting, in order to place the marking in the patient's visual field, away from any creases or abdominal contour issues and within the rectus muscle. Attempted to mark below the patient's belt line, but this was not possible since there is a significant crease lower which occurs when she is sitting up and this should be avoided if possible.  Marked for colostomy in the LLQ  __8__ cm to the left of the umbilicus and _4___ID below the umbilicus.  Patient's abdomen cleansed with CHG wipes at site markings, allowed to air dry prior to marking. Covered mark with thin film transparent dressing to preserve mark. [facility] Nurse team will follow up with patient after surgery for continue ostomy care and teaching if an ostomy is performed.   Julien Girt MSN, RN, [place], [place], [place]

## 2017-08-21 NOTE — Progress Notes (Signed)
Pathology called to inform that the Right ascending colon mass is adenocarcinoma. Descending colon lesion is traditional serrated adenoma.  Called Dr Hassell Done and discussed results. He is planning to do palliative Right hemi colectomy.   [name] , MD [phone]

## 2017-08-21 NOTE — Progress Notes (Signed)
PT Cancellation Note  Patient Details Name: [name] MRN: [id number] DOB: 09/28/1920   Cancelled Treatment:    Reason Eval/Treat Not Completed: Patient at procedure or test/unavailable. Pt scheduled to have surgery on today. Will hold PT session.    Weston Anna, MPT Pager: [phone]

## 2017-08-21 NOTE — Progress Notes (Signed)
PROGRESS NOTE    [name]  QMV:[id number] DOB: [birth year] DOA: 08/16/2017 PCP: Eulas Post, MD   Brief Narrative: [name] is a 84 y.o. femalewith medical history significant ofcoronary artery disease,paroxysmal A. fib ,diet-controlled type 2 diabetes. She presented with fatigue and found to have symptomatic anemia secondary to colonic mass +/- diverticulosis. Plan for partial colectomy.   Assessment & Plan:   Principal Problem:   Blood loss anemia Active Problems:   Type 2 diabetes mellitus, controlled (HCC)   Hyperlipidemia   Symptomatic anemia   CAD (coronary artery disease)   PAF (paroxysmal atrial fibrillation) (HCC)   Carotid artery disease (HCC)   Chronic diastolic (congestive) heart failure (HCC)   Colonic mass- seen on abd CT   Fatigue associated with anemia   History of TIA (transient ischemic attack)   Gastrointestinal hemorrhage   Protein-calorie malnutrition, severe   Acute blood loss anemia In setting of colonic mass. Concern for malignancy. Patient is s/p 1 units of PRBC. Colonoscopy significant for mass and diverticulosis, but no active bleeding. Repeat hemoglobin stable today. -Watch for blood stools -Intermittent CBC checks. Transfuse as needed for hemoglobin less than 8  Colonic mass Likely malignancy. Colonoscopy also suspicious. Biopsies obtained and are pending. General surgery consulted for surgical resection. -General surgery recommendations: partial colectomy today -Biopsy results pending  Type 2 diabetes, diet controlled Diet controlled -Continue SSI  Hyperlipidemia -Continue Lipitor  CAD On plavix and lipitor as an outpatient. Plavix on hold secondary to GI bleeding -Continue Lipitor  History of TIA -Continue Lipitor  Chronic diastolic heart dysfunction Euvolemic.  Paroxysmal atrial fibrillation Rate controlled. On metoprolol as an outpateint. Not on anticoagulation -Continue metoprolol   DVT prophylaxis:  SCDs Code Status:   Code Status: DNR Family Communication: Two sons and two daughter-in-laws at bedside Disposition Plan: Discharge pending general surgery/goals of care   Consultants:   Gastroenterology  Procedures:   7/6: 1 unit PRBC  7/9: Colonoscopy  Antimicrobials:  None    Subjective: Had a good night. No concerns.  Objective: Vitals:   08/20/17 1500 08/20/17 2013 08/21/17 0536 08/21/17 0857  BP: 136/61 129/61 132/62 140/65  Pulse: 72 88 75 95  Resp: 18 18 18 18   Temp: (!) 97.5 F (36.4 C) 98.6 F (37 C) 98.9 F (37.2 C) 98.4 F (36.9 C)  TempSrc: Oral Oral Oral Oral  SpO2: 100% 97% 97% 96%  Weight:   53.7 kg (118 lb 6.2 oz)   Height:        Intake/Output Summary (Last 24 hours) at 08/21/2017 1001 Last data filed at 08/21/2017 0600 Gross per 24 hour  Intake 2531.25 ml  Output -  Net 2531.25 ml   Filed Weights   08/19/17 0500 08/20/17 0500 08/21/17 0536  Weight: 48.6 kg (107 lb 2.3 oz) 49.9 kg (110 lb) 53.7 kg (118 lb 6.2 oz)    Examination:  General exam: Appears calm and comfortable Respiratory system: Clear to auscultation. Respiratory effort normal. Cardiovascular system: S1 & S2 heard, RRR. 1/6 systolic murmur Gastrointestinal system: Abdomen is nondistended, soft and nontender. Normal bowel sounds heard. Central nervous system: Alert and oriented. No focal neurological deficits. Extremities: No edema. No calf tenderness Skin: No cyanosis. No rashes Psychiatry: Judgement and insight appear normal. Mood & affect appropriate.    Data Reviewed: I have personally reviewed following labs and imaging studies  CBC: Recent Labs  Lab 08/16/17 1532  08/18/17 0455  08/18/17 1239 08/19/17 2841 08/20/17 0523 08/20/17 1209 08/21/17 0533  WBC 10.9*  --  10.2  --   --   --  8.7  --  9.1  NEUTROABS 7.8*  --   --   --   --   --   --   --   --   HGB 7.6*   < > 8.7*   < > 9.3* 9.2* 8.0* 8.9* 8.1*  HCT 24.7*   < > 28.1*   < > 30.4* 29.3* 25.4* 28.7*  25.8*  MCV 90.5  --  89.8  --   --   --  89.8  --  89.6  PLT 315  --  282  --   --   --  268  --  259   < > = values in this interval not displayed.   Basic Metabolic Panel: Recent Labs  Lab 08/16/17 1532 08/20/17 0523 08/21/17 0533  NA 140 141 137  K 4.0 3.8 4.3  CL 102 108 105  CO2 28 24 25   GLUCOSE 152* 122* 124*  BUN 35* 22 15  CREATININE 0.88 0.85 0.77  CALCIUM 8.8* 8.2* 8.2*   GFR: Estimated Creatinine Clearance: 35.7 mL/min (by C-G formula based on SCr of 0.77 mg/dL). Liver Function Tests: Recent Labs  Lab 08/16/17 1532  AST 25  ALT 17  ALKPHOS 41  BILITOT 0.4  PROT 7.4  ALBUMIN 2.9*   No results for input(s): LIPASE, AMYLASE in the last 168 hours. No results for input(s): AMMONIA in the last 168 hours. Coagulation Profile: Recent Labs  Lab 08/16/17 1532 08/21/17 0533  INR 1.07 1.15   Cardiac Enzymes: No results for input(s): CKTOTAL, CKMB, CKMBINDEX, TROPONINI in the last 168 hours. BNP (last 3 results) No results for input(s): PROBNP in the last 8760 hours. HbA1C: Recent Labs    08/20/17 2233  HGBA1C 6.4*   CBG: Recent Labs  Lab 08/20/17 0757 08/20/17 1137 08/20/17 1652 08/20/17 2147 08/21/17 0736  GLUCAP 116* 162* 107* 138* 112*   Lipid Profile: No results for input(s): CHOL, HDL, LDLCALC, TRIG, CHOLHDL, LDLDIRECT in the last 72 hours. Thyroid Function Tests: No results for input(s): TSH, T4TOTAL, FREET4, T3FREE, THYROIDAB in the last 72 hours. Anemia Panel: No results for input(s): VITAMINB12, FOLATE, FERRITIN, TIBC, IRON, RETICCTPCT in the last 72 hours. Sepsis Labs: No results for input(s): PROCALCITON, LATICACIDVEN in the last 168 hours.  Recent Results (from the past 240 hour(s))  Surgical pcr screen     Status: None   Collection Time: 08/21/17  5:16 AM  Result Value Ref Range Status   MRSA, PCR NEGATIVE NEGATIVE Final   Staphylococcus aureus NEGATIVE NEGATIVE Final    Comment: (NOTE) The Xpert SA Assay (FDA approved for  NASAL specimens in patients 62 years of age and older), is one component of a comprehensive surveillance program. It is not intended to diagnose infection nor to guide or monitor treatment. Performed at [facility], [place] [address], [place], [place] [address]          Radiology Studies: Dg Chest 2 View  Result Date: 08/20/2017 CLINICAL DATA:  Preoperative evaluation for colon surgery EXAM: CHEST - 2 VIEW COMPARISON:  09/06/2016 FINDINGS: Loop recorder projects over lower medial LEFT chest. Normal heart size, mediastinal contours, and pulmonary vascularity. Hyperinflation and bronchitic changes question COPD. Biapical scarring. Diffuse interstitial prominence throughout both lungs similar to previous exam favoring pulmonary fibrosis. No definite acute infiltrate, pleural effusion or pneumothorax. Bones demineralized. IMPRESSION: Probable pulmonary fibrosis and question underlying COPD. No acute abnormalities.  Electronically Signed   By: Lavonia Dana M.D.   On: 08/20/2017 16:11        Scheduled Meds: . [MAR Hold] atorvastatin  20 mg Oral q1800  . [MAR Hold] feeding supplement (ENSURE ENLIVE)  237 mL Oral TID BM  . [MAR Hold] insulin aspart  0-9 Units Subcutaneous TID WC  . [MAR Hold] metoprolol tartrate  25 mg Oral BID  . [MAR Hold] pantoprazole  20 mg Oral QODAY   Continuous Infusions: . cefoTEtan (CEFOTAN) IV    . lactated ringers 75 mL/hr at 08/20/17 1154  . lactated ringers 20 mL/hr at 08/21/17 0947     LOS: 5 days     Cordelia Poche, MD [facility] 08/21/2017, 10:01 AM Pager: [phone]  If 7PM-7AM, please contact night-coverage [email or web address] 08/21/2017, 10:01 AM

## 2017-08-21 NOTE — Op Note (Signed)
[name]  05-29-1920 August 21, 2017   PCP:  Eulas Post, MD   Surgeon: Kaylyn Lim, MD, FACS  Asst:  Modena Jansky, MD, FACS  Anes:  general  Preop Dx: Obstructing ascending colon cancer-advanced Postop Dx: same  Procedure: Lap assisted right hemicolectomy Location Surgery: [facility] 4 Complications: none  EBL:   minimal cc  Drains: none  Description of Procedure:  The patient was taken to OR  .  After anesthesia was administered and the patient was prepped a timeout was performed.  Access to the abdomen was achieved through the left upper quadrant with a 5 mm Optiview technique.  Following insufflation trochars were placed just above the umbilicus below the umbilicus in the right lower quadrant.  The mass have been tattooed and was located in the upper portions of the a sending colon near the hepatic flexure.  Mobilization was begun using the harmonic scalpel and this was carried from the appendix up to the hepatic flexure and then from the transverse colon over and found that it was stuck down in the retroperitoneum.  At that point elected to make a small midline incision and I was able to exteriorize the colon using blunt dissection and the harmonic scalpel to free it where it was stuck posteriorly.  It appeared to be a near obstructing large mass.  Proximally and distally this was bowel was divided with the 6 cm Davidian platform using a tan load.  The bowel limbs were then placed side to side and anchored with a suture and held in place while enterotomies were made and a purple load was introduced down either limb and fired.  No bleeding was noted.  The common defect was then closed in 2 layers with 3 oh silks lining the the edge of the staple lines either end and then a layer of 4-0 PDS in a running canal fashion and then this and outside layer of Lembert sutures of 3-0 silk completed the anastomosis.  Figure-of-eight sutures were used to approximate the mesentery and closed that  defect.  Area was was then irrigated with saline.  We changed our gloves and counts.  I closed the peritoneum with a running 0 PDS.  The fascia was closed with interrupted inverting simple sutures with #1 Novafil.  Fascia was injected with Exparel.  Wound was irrigated and closed with staples.  I inserted the camera and insufflated to check the anastomosis and also use a sucker tip to suck out the right upper quadrant.  Breathing appear to be in order.  Staples were used on the trocar sites after we decompressed the abdomen.    The patient tolerated the procedure well and was taken to the PACU in stable condition.     [name] Done, [place], [facility], [place]

## 2017-08-21 NOTE — Transfer of Care (Signed)
Immediate Anesthesia Transfer of Care Note  Patient: [name]  Procedure(s) Performed: LAPAROSCOPIC RIGHT COLECTOMY (Right )  Patient Location: PACU  Anesthesia Type:General  Level of Consciousness: awake, alert , oriented and patient cooperative  Airway & Oxygen Therapy: Patient Spontanous Breathing and Patient connected to face mask oxygen  Post-op Assessment: Report given to RN, Post -op Vital signs reviewed and stable and Patient moving all extremities  Post vital signs: Reviewed and stable  Last Vitals:  Vitals Value Taken Time  BP 173/77 08/21/2017  2:35 PM  Temp    Pulse 80 08/21/2017  2:38 PM  Resp 18 08/21/2017  2:38 PM  SpO2 100 % 08/21/2017  2:38 PM  Vitals shown include unvalidated device data.  Last Pain:  Vitals:   08/21/17 0857  TempSrc: Oral  PainSc:          Complications: No apparent anesthesia complications

## 2017-08-22 ENCOUNTER — Encounter (HOSPITAL_COMMUNITY): Payer: Self-pay | Admitting: Surgery

## 2017-08-22 LAB — GLUCOSE, CAPILLARY
GLUCOSE-CAPILLARY: 105 mg/dL — AB (ref 70–99)
GLUCOSE-CAPILLARY: 125 mg/dL — AB (ref 70–99)
GLUCOSE-CAPILLARY: 155 mg/dL — AB (ref 70–99)
Glucose-Capillary: 147 mg/dL — ABNORMAL HIGH (ref 70–99)
Glucose-Capillary: 169 mg/dL — ABNORMAL HIGH (ref 70–99)
Glucose-Capillary: 114 mg/dL — ABNORMAL HIGH (ref 70–99)

## 2017-08-22 LAB — BASIC METABOLIC PANEL
Anion gap: 8 (ref 5–15)
BUN: 20 mg/dL (ref 8–23)
CO2: 23 mmol/L (ref 22–32)
Calcium: 7.7 mg/dL — ABNORMAL LOW (ref 8.9–10.3)
Chloride: 105 mmol/L (ref 98–111)
Creatinine, Ser: 0.92 mg/dL (ref 0.44–1.00)
GFR calc non Af Amer: 51 mL/min — ABNORMAL LOW (ref >60)
GFR, EST AFRICAN AMERICAN: 59 mL/min — AB (ref >60)
Glucose, Bld: 180 mg/dL — ABNORMAL HIGH (ref 70–99)
POTASSIUM: 4.6 mmol/L (ref 3.5–5.1)
SODIUM: 136 mmol/L (ref 135–145)

## 2017-08-22 LAB — BPAM RBC
Blood Product Expiration Date: 201908052359
ISSUE DATE / TIME: 201907111222
Unit Type and Rh: 6200

## 2017-08-22 LAB — TYPE AND SCREEN
ABO/RH(D): A POS
ANTIBODY SCREEN: NEGATIVE
UNIT DIVISION: 0

## 2017-08-22 LAB — CBC
HEMATOCRIT: 29.5 % — AB (ref 36.0–46.0)
HEMOGLOBIN: 9.6 g/dL — AB (ref 12.0–15.0)
MCH: 28.2 pg (ref 26.0–34.0)
MCHC: 32.5 g/dL (ref 30.0–36.0)
MCV: 86.8 fL (ref 78.0–100.0)
Platelets: 230 10*3/uL (ref 150–400)
RBC: 3.4 MIL/uL — AB (ref 3.87–5.11)
RDW: 14.7 % (ref 11.5–15.5)
WBC: 11.5 10*3/uL — AB (ref 4.0–10.5)

## 2017-08-22 MED ORDER — METHOCARBAMOL 1000 MG/10ML IJ SOLN
500.0000 mg | Freq: Three times a day (TID) | INTRAVENOUS | Status: DC
  Administered 2017-08-22 – 2017-08-24 (×6): 500 mg via INTRAVENOUS
  Filled 2017-08-22: qty 5
  Filled 2017-08-22 (×6): qty 550
  Filled 2017-08-22 (×2): qty 5

## 2017-08-22 NOTE — Clinical Social Work Note (Signed)
Clinical Social Work Assessment  Patient Details  Name: [name] MRN: [id number] Date of Birth: October 21, 1920  Date of referral:  08/22/17               Reason for consult:  Discharge Planning                Permission sought to share information with:  Facility Sport and exercise psychologist, Case Optician, dispensing granted to share information::  Yes, Verbal Permission Granted  Name::        Agency::  SNF  Relationship::  Sons   Contact Information:     Housing/Transportation Living arrangements for the past 2 months:  Single Family Home Source of Information:  Patient, Adult Children Patient Interpreter Needed:  None Criminal Activity/Legal Involvement Pertinent to Current Situation/Hospitalization:  No - Comment as needed Significant Relationships:  Adult Children, Friend Lives with:  Self Do you feel safe going back to the place where you live?  Yes Need for family participation in patient care:  Yes (Comment)  Care giving concerns: SNF for short term rehab.   Social Worker assessment / plan:  CSW met with the patient and her son's at bedside. CSW explained and reason for visit-to assist with discharge planning. Patient reports she is agreeable to rehab at [facility]. Patient reports she lives alone and feels she will need to regain her strength before returning home. She reports being fairly independent prior to hospital admission. Patient states she is interested in [place] or [place].  CSW explain placement at theses SNF's is not guaranteed. CSW explain SNF list and later following up the bed offers.   FL2 complete.  PASRR done.  SNF of choice will need [facility] insurance authorization .   Plan: SNF  Employment status:  Retired Nurse, adult PT Recommendations:  [place], Home with [place] / Referral to community resources:  [place]  Patient/Family's Response to care: Agreeable and  Responding well to care.   Patient/Family's Understanding of and Emotional Response to Diagnosis, Current Treatment, and Prognosis:  Patient alert and oriented x4. Patient understands her current diagnosis and follow up plan for SNF. Patient sons at bedside are also very involved.   Emotional Assessment Appearance:  Appears stated age Attitude/Demeanor/Rapport:    Affect (typically observed):  Accepting Orientation:  Oriented to Self, Oriented to Place, Oriented to  Time, Oriented to Situation Alcohol / Substance use:  Not Applicable Psych involvement (Current and /or in the community):  No (Comment)  Discharge Needs  Concerns to be addressed:  Discharge Planning Concerns Readmission within the last 30 days:  No Current discharge risk:  Dependent with Mobility Barriers to Discharge:  Continued Medical Work up, [place], LCSW 08/22/2017, 11:08 AM

## 2017-08-22 NOTE — Progress Notes (Signed)
PROGRESS NOTE    [name]  FBX:[id number] DOB: 1921/02/03 DOA: 08/16/2017 PCP: Eulas Post, MD   Brief Narrative: [name] is a 84 y.o. femalewith medical history significant ofcoronary artery disease,paroxysmal A. fib ,diet-controlled type 2 diabetes. She presented with fatigue and found to have symptomatic anemia secondary to colonic mass +/- diverticulosis. Plan for partial colectomy.   Assessment & Plan:   Principal Problem:   Blood loss anemia Active Problems:   Type 2 diabetes mellitus, controlled (HCC)   Hyperlipidemia   Symptomatic anemia   CAD (coronary artery disease)   PAF (paroxysmal atrial fibrillation) (HCC)   Carotid artery disease (HCC)   Chronic diastolic (congestive) heart failure (HCC)   Colonic mass- seen on abd CT   Fatigue associated with anemia   History of TIA (transient ischemic attack)   Gastrointestinal hemorrhage   Protein-calorie malnutrition, severe   Acute blood loss anemia In setting of colonic mass. Concern for malignancy. Patient is s/p 1 units of PRBC. Colonoscopy significant for mass and diverticulosis, but no active bleeding. Hemoglobin stable.  Adenocarcinoma of the colon Confirmed on biopsy. S/p right colectomy on 7/11. -General surgery recommendations -GI recommendations -Palliative care recommendations  Type 2 diabetes, diet controlled Diet controlled -Continue SSI  Hyperlipidemia -Continue Lipitor  CAD On plavix and lipitor as an outpatient. Plavix on hold secondary to GI bleeding -Continue Lipitor  History of TIA -Continue Lipitor  Chronic diastolic heart dysfunction Euvolemic.  Paroxysmal atrial fibrillation Rate controlled. On metoprolol as an outpateint. Not on anticoagulation -Continue metoprolol   DVT prophylaxis: SCDs Code Status:   Code Status: DNR Family Communication: None at bedside Disposition Plan: Discharge pending general surgery/goals of care   Consultants:    Gastroenterology  General surgery  Palliative care  Procedures:   7/6: 1 unit PRBC  7/9: Colonoscopy  7/11: Right colectomy  Antimicrobials:  None    Subjective: No issues today. Wants to eat. Abdominal pain is manageable.  Objective: Vitals:   08/22/17 0405 08/22/17 0500 08/22/17 0600 08/22/17 0800  BP:  (!) 144/65 (!) 142/54   Pulse:  70 71   Resp:  18 16   Temp: (!) 97.5 F (36.4 C)   97.6 F (36.4 C)  TempSrc: Oral   Oral  SpO2:  99% 100%   Weight:  54 kg (119 lb 0.8 oz)    Height:        Intake/Output Summary (Last 24 hours) at 08/22/2017 0926 Last data filed at 08/22/2017 0600 Gross per 24 hour  Intake 4612.5 ml  Output 1420 ml  Net 3192.5 ml   Filed Weights   08/21/17 0536 08/21/17 1600 08/22/17 0500  Weight: 53.7 kg (118 lb 6.2 oz) 52.5 kg (115 lb 11.9 oz) 54 kg (119 lb 0.8 oz)    Examination:  General exam: Appears calm and comfortable Respiratory system: Clear to auscultation. Respiratory effort normal. Cardiovascular system: S1 & S2 heard, RRR. No murmurs, rubs, gallops or clicks. Gastrointestinal system: Abdomen is nondistended, soft and mildly tender. Significantly decreased bowel sounds. Dressing intact and clean. Central nervous system: Alert and oriented. No focal neurological deficits. Extremities: No edema. No calf tenderness Skin: No cyanosis. Psychiatry: Judgement and insight appear normal. Mood & affect appropriate.    Data Reviewed: I have personally reviewed following labs and imaging studies  CBC: Recent Labs  Lab 08/16/17 1532  08/18/17 0455  08/19/17 0711 08/20/17 0523 08/20/17 1209 08/21/17 0533 08/22/17 0725  WBC 10.9*  --  10.2  --   --  8.7  --  9.1 11.5*  NEUTROABS 7.8*  --   --   --   --   --   --   --   --   HGB 7.6*   < > 8.7*   < > 9.2* 8.0* 8.9* 8.1* 9.6*  HCT 24.7*   < > 28.1*   < > 29.3* 25.4* 28.7* 25.8* 29.5*  MCV 90.5  --  89.8  --   --  89.8  --  89.6 86.8  PLT 315  --  282  --   --  268  --  259  230   < > = values in this interval not displayed.   Basic Metabolic Panel: Recent Labs  Lab 08/16/17 1532 08/20/17 0523 08/21/17 0533 08/22/17 0725  NA 140 141 137 136  K 4.0 3.8 4.3 4.6  CL 102 108 105 105  CO2 28 24 25 23   GLUCOSE 152* 122* 124* 180*  BUN 35* 22 15 20   CREATININE 0.88 0.85 0.77 0.92  CALCIUM 8.8* 8.2* 8.2* 7.7*   GFR: Estimated Creatinine Clearance: 31.2 mL/min (by C-G formula based on SCr of 0.92 mg/dL). Liver Function Tests: Recent Labs  Lab 08/16/17 1532  AST 25  ALT 17  ALKPHOS 41  BILITOT 0.4  PROT 7.4  ALBUMIN 2.9*   No results for input(s): LIPASE, AMYLASE in the last 168 hours. No results for input(s): AMMONIA in the last 168 hours. Coagulation Profile: Recent Labs  Lab 08/16/17 1532 08/21/17 0533  INR 1.07 1.15   Cardiac Enzymes: No results for input(s): CKTOTAL, CKMB, CKMBINDEX, TROPONINI in the last 168 hours. BNP (last 3 results) No results for input(s): PROBNP in the last 8760 hours. HbA1C: Recent Labs    08/20/17 2233  HGBA1C 6.4*   CBG: Recent Labs  Lab 08/21/17 1659 08/21/17 2021 08/21/17 2306 08/22/17 0317 08/22/17 0741  GLUCAP 161* 156* 118* 147* 169*   Lipid Profile: No results for input(s): CHOL, HDL, LDLCALC, TRIG, CHOLHDL, LDLDIRECT in the last 72 hours. Thyroid Function Tests: No results for input(s): TSH, T4TOTAL, FREET4, T3FREE, THYROIDAB in the last 72 hours. Anemia Panel: No results for input(s): VITAMINB12, FOLATE, FERRITIN, TIBC, IRON, RETICCTPCT in the last 72 hours. Sepsis Labs: No results for input(s): PROCALCITON, LATICACIDVEN in the last 168 hours.  Recent Results (from the past 240 hour(s))  Surgical pcr screen     Status: None   Collection Time: 08/21/17  5:16 AM  Result Value Ref Range Status   MRSA, PCR NEGATIVE NEGATIVE Final   Staphylococcus aureus NEGATIVE NEGATIVE Final    Comment: (NOTE) The Xpert SA Assay (FDA approved for NASAL specimens in patients 77 years of age and  older), is one component of a comprehensive surveillance program. It is not intended to diagnose infection nor to guide or monitor treatment. Performed at [facility], [place] [address], [place], [place] [address]          Radiology Studies: Dg Chest 2 View  Result Date: 08/20/2017 CLINICAL DATA:  Preoperative evaluation for colon surgery EXAM: CHEST - 2 VIEW COMPARISON:  09/06/2016 FINDINGS: Loop recorder projects over lower medial LEFT chest. Normal heart size, mediastinal contours, and pulmonary vascularity. Hyperinflation and bronchitic changes question COPD. Biapical scarring. Diffuse interstitial prominence throughout both lungs similar to previous exam favoring pulmonary fibrosis. No definite acute infiltrate, pleural effusion or pneumothorax. Bones demineralized. IMPRESSION: Probable pulmonary fibrosis and question underlying COPD. No acute abnormalities. Electronically Signed   By: Lavonia Dana  M.D.   On: 08/20/2017 16:11        Scheduled Meds: . alvimopan  12 mg Oral BID  . insulin aspart  0-9 Units Subcutaneous Q4H  . mouth rinse  15 mL Mouth Rinse BID  . metoprolol tartrate  2.5 mg Intravenous Q6H   Continuous Infusions: . acetaminophen Stopped (08/22/17 0529)  . famotidine (PEPCID) IV Stopped (08/21/17 2207)  . lactated ringers 75 mL/hr at 08/22/17 0600  . methocarbamol (ROBAXIN)  IV       LOS: 6 days     Cordelia Poche, MD [facility] 08/22/2017, 9:26 AM Pager: [phone]  If 7PM-7AM, please contact night-coverage [email or web address] 08/22/2017, 9:26 AM

## 2017-08-22 NOTE — Progress Notes (Signed)
[facility] Progress Note  1 Day Post-Op  Subjective: CC: abdominal soreness Patient reports abdomen is sore post-operatively but overall pain well controlled. She denies nausea or distention. No flatus. Patient has not been up out of bed yet. Patient has been coughing up some blood tinged sputum, explained that this is common after intubation from surgery.   Objective: Vital signs in last 24 hours: Temp:  [97 F (36.1 C)-98.4 F (36.9 C)] 97.5 F (36.4 C) (07/12 0405) Pulse Rate:  [63-95] 71 (07/12 0600) Resp:  [9-19] 16 (07/12 0600) BP: (137-187)/(44-81) 142/54 (07/12 0600) SpO2:  [92 %-100 %] 100 % (07/12 0600) Weight:  [52.5 kg (115 lb 11.9 oz)-54 kg (119 lb 0.8 oz)] 54 kg (119 lb 0.8 oz) (07/12 0500) Last BM Date: 08/20/17  Intake/Output from previous day: 07/11 0701 - 07/12 0700 In: 4612.5 [I.V.:4097.5; Blood:315; IV Piggyback:200] Out: 0865 [Urine:1400; Blood:20] Intake/Output this shift: No intake/output data recorded.  PE: Gen:  Alert, NAD, pleasant Card:  Regular rate and rhythm, pedal pulses 2+ BL Pulm:  Normal effort, clear to auscultation bilaterally, pulled 500 on IS Abd: Soft, minimally tender, non-distended, bowel sounds present, no HSM, incisions C/D/I with surgical dressings in place Skin: warm and dry, no rashes  Psych: A&Ox3   Lab Results:  Recent Labs    08/20/17 0523 08/20/17 1209 08/21/17 0533  WBC 8.7  --  9.1  HGB 8.0* 8.9* 8.1*  HCT 25.4* 28.7* 25.8*  PLT 268  --  259   BMET Recent Labs    08/20/17 0523 08/21/17 0533  NA 141 137  K 3.8 4.3  CL 108 105  CO2 24 25  GLUCOSE 122* 124*  BUN 22 15  CREATININE 0.85 0.77  CALCIUM 8.2* 8.2*   PT/INR Recent Labs    08/21/17 0533  LABPROT 14.6  INR 1.15   CMP     Component Value Date/Time   NA 137 08/21/2017 0533   K 4.3 08/21/2017 0533   CL 105 08/21/2017 0533   CO2 25 08/21/2017 0533   GLUCOSE 124 (H) 08/21/2017 0533   GLUCOSE 118 (H) 01/13/2006 1010   BUN 15  08/21/2017 0533   CREATININE 0.77 08/21/2017 0533   CREATININE 0.90 (H) 08/13/2017 1546   CALCIUM 8.2 (L) 08/21/2017 0533   PROT 7.4 08/16/2017 1532   ALBUMIN 2.9 (L) 08/16/2017 1532   AST 25 08/16/2017 1532   ALT 17 08/16/2017 1532   ALKPHOS 41 08/16/2017 1532   BILITOT 0.4 08/16/2017 1532   GFRNONAA >60 08/21/2017 0533   GFRAA >60 08/21/2017 0533   Lipase     Component Value Date/Time   LIPASE 51 02/14/2017 1434       Studies/Results: Dg Chest 2 View  Result Date: 08/20/2017 CLINICAL DATA:  Preoperative evaluation for colon surgery EXAM: CHEST - 2 VIEW COMPARISON:  09/06/2016 FINDINGS: Loop recorder projects over lower medial LEFT chest. Normal heart size, mediastinal contours, and pulmonary vascularity. Hyperinflation and bronchitic changes question COPD. Biapical scarring. Diffuse interstitial prominence throughout both lungs similar to previous exam favoring pulmonary fibrosis. No definite acute infiltrate, pleural effusion or pneumothorax. Bones demineralized. IMPRESSION: Probable pulmonary fibrosis and question underlying COPD. No acute abnormalities. Electronically Signed   By: Lavonia Dana M.D.   On: 08/20/2017 16:11    Anti-infectives: Anti-infectives (From admission, onward)   Start     Dose/Rate Route Frequency Ordered Stop   08/21/17 0600  cefoTEtan (CEFOTAN) 2 g in sodium chloride 0.9 % 100 mL IVPB  2 g 200 mL/hr over 30 Minutes Intravenous On call to O.R. 08/20/17 2208 08/21/17 1159       Assessment/Plan CAD/Hx of PAF/chronic diastolic dysfunction/LBBB Hx of TIA/hx of syncope Hx of Carotid dz -Left CEA 11/2010 Type II diabetes Hypertension L2 compression fracture  Ascending and distal descending colon masses - pathology from colonoscopy showed adenocarcinoma for the ascending site and benign adenoma for the descending site S/P lap assisted R hemicolectomy 08/21/17 Dr. Hassell Done - POD#1 - pain well controlled and patient denies nausea  - no bowel function  yet - may be able to start some clears tomorrow, just ice chips and sips of water today - entereg today - mobilize as tolerated, PT/OT - discontinue foley tomorrow Anemia secondary to bleeding from the colon mass - H/H 9.6/29.5 today, continue to monitor Peaked T waves on cardiac monitor - noted while in the room with primary service attending, EKG  FEN: NPO - ice chips and sips of water; IVF VTE: SCDs ID: [email or web address] periop Foley: present - d/c tomorrow Follow up: TBD    LOS: 6 days    Brigid Re , [facility] 08/22/2017, 7:28 AM Pager: [phone] Consults: [phone] Mon-Fri 7:00 am-4:30 pm Sat-Sun 7:00 am-11:30 am

## 2017-08-22 NOTE — Progress Notes (Signed)
PMT progress note  Patient was seen earlier today, in the stepdown unit, with son and daughter in law at bedside, along with bedside RN present in the room.   Patient is in good spirits, resting well, post op day 1. "I didn't expect it to go this well, but I'm doing well."  BP (!) 145/56   Pulse 78   Temp 97.6 F (36.4 C) (Oral)   Resp 15   Ht 5' 6.5" (1.689 m)   Wt 54 kg (119 lb 0.8 oz)   SpO2 97%   BMI 18.93 kg/m  Labs and imaging noted Op note reviewed.   NPO except for ice chips for today.   Awake alert In no distress Regular Abdomen with mild generalized tenderness, exacerbated with movement.  No edema Non focal S1 S2  Post op day 1 s/p lap assisted R hemicolectomy.  Colonic mass CAD Paroxysmal A fib  PMT following for goals of care and supportive care.  Recommend SNF rehab with palliative care on discharge.  15 minutes spent.  Loistine Chance MD [facility] palliative medicine team [phone]

## 2017-08-22 NOTE — NC FL2 (Signed)
[place] LEVEL OF CARE SCREENING TOOL     IDENTIFICATION  Patient Name: [name] Birthdate: 1920/02/17 Sex: female Admission Date (Current Location): 08/16/2017  [facility] and [place] Number:  Herbalist and Address:  [facility],  [address], [place]      Provider Number: [id number]  Attending Physician Name and Address:  Mariel Aloe, MD  Relative Name and Phone Number:       Current Level of Care: Hospital Recommended Level of Care: [place] Prior Approval Number:    Date Approved/Denied:   PASRR Number:  [id number]   Discharge Plan:      Current Diagnoses: Patient Active Problem List   Diagnosis Date Noted  . Protein-calorie malnutrition, severe 08/20/2017  . Gastrointestinal hemorrhage   . Blood loss anemia 08/16/2017  . Colonic mass- seen on abd CT 08/16/2017  . Fatigue associated with anemia 08/16/2017  . History of TIA (transient ischemic attack) 08/16/2017  . Protein-calorie malnutrition ([place]) 06/04/2017  . Acute diarrhea 03/07/2017  . Acute on chronic respiratory failure with hypoxia ([place]) 09/06/2016  . Fall 09/06/2016  . Chronic diastolic (congestive) heart failure ([place]) 09/06/2016  . Open fracture of nasal bones   . Traumatic subarachnoid bleed with LOC of 30 minutes or less, initial encounter ([place]) 06/03/2016  . ILD (interstitial lung disease) ([place]) 04/06/2015  . Cough 04/06/2015  . Shortness of breath 03/15/2015  . Dyspnea 03/15/2015  . SOB (shortness of breath) 03/09/2015  . Perennial allergic rhinitis 01/17/2014  . Transient alteration of awareness 11/04/2013  . Syncope 10/15/2013  . Chronic insomnia 06/30/2013  . Aftercare following surgery of the circulatory system, [place] 06/22/2013  . Seasonal allergies 06/08/2013  . Normocytic anemia 10/29/2012  . Benign paroxysmal positional vertigo 06/16/2012  . Vertigo 03/23/2012  . Carotid artery disease ([place]) 10/18/2010  .  Amaurosis fugax 10/18/2010  . Palpitations 06/14/2010  . KNEE PAIN 01/24/2010  . COLONIC POLYPS, ADENOMATOUS 12/26/2009  . Internal and external bleeding hemorrhoids 12/26/2009  . CONSTIPATION 12/26/2009  . Symptomatic anemia 11/30/2009  . FATIGUE / MALAISE 11/27/2009  . PAF (paroxysmal atrial fibrillation) ([place]) 04/12/2009  . COLONIC POLYPS, HYPERPLASTIC, HX OF 03/01/2009  . CAD (coronary artery disease) 04/27/2008  . GERD 02/16/2007  . Hyperlipidemia 09/09/2006  . Essential hypertension 09/09/2006  . Transient cerebral ischemia 09/02/2006  . Type 2 diabetes mellitus, controlled ([place]) 08/11/2006  . LEFT BUNDLE BRANCH BLOCK 08/11/2006  . DIVERTICULOSIS, COLON 08/11/2006  . Osteoarthritis 08/11/2006  . OSTEOPOROSIS 08/11/2006    Orientation RESPIRATION BLADDER Height & Weight     Self, Time, Situation, Place  Normal Continent Weight: 119 lb 0.8 oz (54 kg) Height:  5' 6.5" (168.9 cm)  BEHAVIORAL SYMPTOMS/MOOD NEUROLOGICAL BOWEL NUTRITION STATUS      Continent Diet(See discharge summary )  AMBULATORY STATUS COMMUNICATION OF NEEDS Skin   Extensive Assist Verbally (Abdomen Incision )                       Personal Care Assistance Level of Assistance  Bathing, Feeding, Dressing Bathing Assistance: Limited assistance Feeding assistance: Independent Dressing Assistance: Limited assistance     Functional Limitations Info  Sight, Speech, Hearing Sight Info: Impaired(Wears Glasses) Hearing Info: Impaired Speech Info: Adequate    SPECIAL CARE FACTORS FREQUENCY  PT (By licensed PT), OT (By licensed OT)     PT Frequency: 5x/week  OT Frequency: 5x/week  Contractures Contractures Info: Not present    Additional Factors Info  Insulin Sliding Scale Code Status Info: DNR Allergies Info: Sulfamethoxazole-trimethoprim, Sulfonamide Derivatives, Nitrofurantoin   Insulin Sliding Scale Info: 0-9 units every 4 hours        Current Medications (08/22/2017):   This is the current hospital active medication list Current Facility-Administered Medications  Medication Dose Route Frequency Provider Last Rate Last Dose  . acetaminophen (OFIRMEV) IV 1,000 mg  1,000 mg Intravenous Q6H Earnstine Regal, PA-C   Stopped at 08/22/17 0529  . alvimopan (ENTEREG) capsule 12 mg  12 mg Oral BID Earnstine Regal, PA-C   12 mg at 08/22/17 0951  . famotidine (PEPCID) IVPB 20 mg premix  20 mg Intravenous Q24H Earnstine Regal, PA-C   Stopped at 08/21/17 2207  . insulin aspart (novoLOG) injection 0-9 Units  0-9 Units Subcutaneous Q4H Bodenheimer, Charles A, NP   2 Units at 08/22/17 0949  . lactated ringers infusion   Intravenous Continuous Earnstine Regal, PA-C 75 mL/hr at 08/22/17 0600    . MEDLINE mouth rinse  15 mL Mouth Rinse BID Mariel Aloe, MD   15 mL at 08/22/17 0951  . methocarbamol (ROBAXIN) 500 mg in dextrose 5 % 50 mL IVPB  500 mg Intravenous Q8H Rayburn, Kelly A, PA-C      . metoprolol tartrate (LOPRESSOR) injection 2.5 mg  2.5 mg Intravenous Q6H Earnstine Regal, PA-C   Stopped at 08/22/17 [phone]  . morphine 2 MG/ML injection 2-2.5 mg  2-2.5 mg Intravenous Q2H PRN Earnstine Regal, PA-C      . ondansetron [facility]) tablet 4 mg  4 mg Oral Q6H PRN Earnstine Regal, PA-C       Or  . ondansetron [facility]) injection 4 mg  4 mg Intravenous Q6H PRN Earnstine Regal, PA-C         Discharge Medications: Please see discharge summary for a list of discharge medications.  Relevant Imaging Results:  Relevant Lab Results:   Additional Information SSN: [place] Alexica Schlossberg, LCSW

## 2017-08-22 NOTE — Consult Note (Addendum)
WOC follow-up: Pt did not receive a colostomy yesterday; please refer to surgical team for plan of care. Please re-consult if further assistance is needed.  Thank-you,  Julien Girt MSN, [place], [place], [place], [place]

## 2017-08-23 ENCOUNTER — Inpatient Hospital Stay (HOSPITAL_COMMUNITY): Payer: Medicare Other

## 2017-08-23 ENCOUNTER — Encounter (HOSPITAL_COMMUNITY): Payer: Self-pay | Admitting: Surgery

## 2017-08-23 DIAGNOSIS — C182 Malignant neoplasm of ascending colon: Secondary | ICD-10-CM

## 2017-08-23 DIAGNOSIS — K56609 Unspecified intestinal obstruction, unspecified as to partial versus complete obstruction: Secondary | ICD-10-CM

## 2017-08-23 LAB — GLUCOSE, CAPILLARY
GLUCOSE-CAPILLARY: 141 mg/dL — AB (ref 70–99)
GLUCOSE-CAPILLARY: 133 mg/dL — AB (ref 70–99)
Glucose-Capillary: 83 mg/dL (ref 70–99)
Glucose-Capillary: 85 mg/dL (ref 70–99)
Glucose-Capillary: 132 mg/dL — ABNORMAL HIGH (ref 70–99)

## 2017-08-23 MED ORDER — HYDROCORTISONE 1 % EX CREA
1.0000 "application " | TOPICAL_CREAM | Freq: Three times a day (TID) | CUTANEOUS | Status: DC | PRN
  Filled 2017-08-23: qty 28

## 2017-08-23 MED ORDER — HYDROCORTISONE 2.5 % RE CREA
1.0000 "application " | TOPICAL_CREAM | Freq: Four times a day (QID) | RECTAL | Status: DC | PRN
  Filled 2017-08-23: qty 28.35

## 2017-08-23 MED ORDER — FENTANYL CITRATE (PF) 100 MCG/2ML IJ SOLN: 25.0000 ug | INTRAMUSCULAR | Status: DC | PRN

## 2017-08-23 MED ORDER — ONDANSETRON HCL 40 MG/20ML IJ SOLN
8.0000 mg | Freq: Four times a day (QID) | INTRAMUSCULAR | Status: DC | PRN
  Filled 2017-08-23: qty 4

## 2017-08-23 MED ORDER — PHENOL 1.4 % MT LIQD
1.0000 | OROMUCOSAL | Status: DC | PRN
  Filled 2017-08-23: qty 177

## 2017-08-23 MED ORDER — MENTHOL 3 MG MT LOZG
1.0000 | LOZENGE | OROMUCOSAL | Status: DC | PRN
  Filled 2017-08-23: qty 9

## 2017-08-23 MED ORDER — SACCHAROMYCES BOULARDII 250 MG PO CAPS
250.0000 mg | ORAL_CAPSULE | Freq: Two times a day (BID) | ORAL | Status: DC
  Administered 2017-08-23 – 2017-08-26 (×6): 250 mg via ORAL
  Filled 2017-08-23 (×6): qty 1

## 2017-08-23 MED ORDER — ASPIRIN EC 81 MG PO TBEC
81.0000 mg | DELAYED_RELEASE_TABLET | Freq: Every day | ORAL | Status: DC
  Administered 2017-08-23 – 2017-08-26 (×4): 81 mg via ORAL
  Filled 2017-08-23 (×4): qty 1

## 2017-08-23 MED ORDER — SODIUM CHLORIDE 0.9% FLUSH
3.0000 mL | INTRAVENOUS | Status: DC | PRN
  Administered 2017-08-25: 3 mL via INTRAVENOUS
  Filled 2017-08-23: qty 3

## 2017-08-23 MED ORDER — PSYLLIUM 95 % PO PACK
1.0000 | PACK | Freq: Every day | ORAL | Status: DC
  Administered 2017-08-23 – 2017-08-25 (×3): 1 via ORAL
  Filled 2017-08-23 (×3): qty 1

## 2017-08-23 MED ORDER — PROCHLORPERAZINE EDISYLATE 10 MG/2ML IJ SOLN: 5.0000 mg | INTRAMUSCULAR | Status: DC | PRN

## 2017-08-23 MED ORDER — SODIUM CHLORIDE 0.9% FLUSH
3.0000 mL | Freq: Two times a day (BID) | INTRAVENOUS | Status: DC
  Administered 2017-08-23 – 2017-08-25 (×4): 3 mL via INTRAVENOUS

## 2017-08-23 MED ORDER — TRAMADOL HCL 50 MG PO TABS: 50.0000 mg | ORAL_TABLET | Freq: Four times a day (QID) | ORAL | Status: DC | PRN

## 2017-08-23 MED ORDER — ONDANSETRON HCL 4 MG/2ML IJ SOLN: 4.0000 mg | Freq: Four times a day (QID) | INTRAMUSCULAR | Status: DC | PRN

## 2017-08-23 MED ORDER — LIP MEDEX EX OINT
1.0000 "application " | TOPICAL_OINTMENT | Freq: Two times a day (BID) | CUTANEOUS | Status: DC
  Administered 2017-08-23 – 2017-08-26 (×6): 1 via TOPICAL
  Filled 2017-08-23 (×3): qty 7

## 2017-08-23 MED ORDER — VITAMIN C 500 MG PO TABS
500.0000 mg | ORAL_TABLET | Freq: Every day | ORAL | Status: DC
  Administered 2017-08-23 – 2017-08-26 (×4): 500 mg via ORAL
  Filled 2017-08-23 (×4): qty 1

## 2017-08-23 MED ORDER — MAGIC MOUTHWASH
15.0000 mL | Freq: Four times a day (QID) | ORAL | Status: DC | PRN
  Filled 2017-08-23: qty 15

## 2017-08-23 MED ORDER — ACETAMINOPHEN 500 MG PO TABS
1000.0000 mg | ORAL_TABLET | Freq: Three times a day (TID) | ORAL | Status: DC
  Administered 2017-08-23 – 2017-08-26 (×8): 1000 mg via ORAL
  Filled 2017-08-23 (×9): qty 2

## 2017-08-23 MED ORDER — LACTATED RINGERS IV BOLUS: 1000.0000 mL | Freq: Three times a day (TID) | INTRAVENOUS | Status: DC | PRN

## 2017-08-23 MED ORDER — PANTOPRAZOLE SODIUM 20 MG PO TBEC
20.0000 mg | DELAYED_RELEASE_TABLET | Freq: Every day | ORAL | Status: DC
  Administered 2017-08-23 – 2017-08-26 (×4): 20 mg via ORAL
  Filled 2017-08-23 (×4): qty 1

## 2017-08-23 MED ORDER — GUAIFENESIN-DM 100-10 MG/5ML PO SYRP: 10.0000 mL | ORAL_SOLUTION | ORAL | Status: DC | PRN

## 2017-08-23 MED ORDER — FUROSEMIDE 10 MG/ML IJ SOLN
40.0000 mg | Freq: Once | INTRAMUSCULAR | Status: AC
  Administered 2017-08-23: 40 mg via INTRAVENOUS
  Filled 2017-08-23: qty 4

## 2017-08-23 MED ORDER — GABAPENTIN 100 MG PO CAPS
100.0000 mg | ORAL_CAPSULE | Freq: Every day | ORAL | Status: DC
Start: 2017-08-23 — End: 2017-08-26
  Administered 2017-08-23 – 2017-08-25 (×3): 100 mg via ORAL
  Filled 2017-08-23 (×4): qty 1

## 2017-08-23 MED ORDER — BISMUTH SUBSALICYLATE 262 MG/15ML PO SUSP
30.0000 mL | Freq: Three times a day (TID) | ORAL | Status: DC | PRN
  Filled 2017-08-23 (×2): qty 236

## 2017-08-23 MED ORDER — ALUM & MAG HYDROXIDE-SIMETH 200-200-20 MG/5ML PO SUSP: 30.0000 mL | Freq: Four times a day (QID) | ORAL | Status: DC | PRN

## 2017-08-23 MED ORDER — SODIUM CHLORIDE 0.9 % IV SOLN: 250.0000 mL | INTRAVENOUS | Status: DC | PRN

## 2017-08-23 NOTE — Progress Notes (Addendum)
PROGRESS NOTE    [name]  ZOX:[id number] DOB: [birth year] DOA: 08/16/2017 PCP: Eulas Post, MD   Brief Narrative: [name] is a 84 y.o. femalewith medical history significant ofcoronary artery disease,paroxysmal A. fib ,diet-controlled type 2 diabetes. She presented with fatigue and found to have symptomatic anemia secondary to colonic mass (confirmed adenocarcinoma) +/- diverticulosis. S/p right colectomy. Advancing diet slowly.   Assessment & Plan:   Principal Problem:   Blood loss anemia Active Problems:   Type 2 diabetes mellitus, controlled (HCC)   Hyperlipidemia   Symptomatic anemia   CAD (coronary artery disease)   PAF (paroxysmal atrial fibrillation) (HCC)   Carotid artery disease (HCC)   Chronic diastolic (congestive) heart failure (HCC)   Colonic mass- seen on abd CT   Fatigue associated with anemia   History of TIA (transient ischemic attack)   Gastrointestinal hemorrhage   Protein-calorie malnutrition, severe   Acute blood loss anemia In setting of colonic mass. Concern for malignancy. Patient is s/p 1 units of PRBC. Colonoscopy significant for mass and diverticulosis, but no active bleeding. Hemoglobin stable.  Adenocarcinoma of the colon Confirmed on biopsy. S/p right colectomy on 7/11. Discussed case with medical oncology, and no role for medical therapy. Recommend palliative care. -General surgery recommendations: advancing diet -GI recommendations -Palliative care recommendations -Will discuss with patient and family once family arrived.  Type 2 diabetes, diet controlled Diet controlled -Continue SSI  Hyperlipidemia -Continue Lipitor  CAD On plavix and lipitor as an outpatient. Plavix on hold secondary to GI bleeding -Continue Lipitor  History of TIA -Continue Lipitor  Chronic diastolic heart dysfunction Euvolemic.  Paroxysmal atrial fibrillation Rate controlled. On metoprolol as an outpateint. Not on  anticoagulation -Continue metoprolol  Delirium Patient with hallucinations overnight. Likely ICU delirium. Will likely improve once patient is transferred to the floor.   DVT prophylaxis: SCDs Code Status:   Code Status: DNR Family Communication: None at bedside Disposition Plan: Discharge to SNF pending general surgery/goals of care   Consultants:   Gastroenterology  General surgery  Palliative care  Procedures:   7/6: 1 unit PRBC  7/9: Colonoscopy  7/11: Right colectomy  Antimicrobials:  None    Subjective: Mild abdominal pain if she moves.  Objective: Vitals:   08/23/17 0345 08/23/17 0445 08/23/17 0500 08/23/17 0731  BP: (!) 161/71     Pulse: 81     Resp: 20     Temp:  97.6 F (36.4 C)  97.9 F (36.6 C)  TempSrc:  Oral  Oral  SpO2: 92%     Weight:   55.3 kg (121 lb 14.6 oz)   Height:        Intake/Output Summary (Last 24 hours) at 08/23/2017 0933 Last data filed at 08/23/2017 0654 Gross per 24 hour  Intake 1860.83 ml  Output 45 ml  Net 1815.83 ml   Filed Weights   08/21/17 1600 08/22/17 0500 08/23/17 0500  Weight: 52.5 kg (115 lb 11.9 oz) 54 kg (119 lb 0.8 oz) 55.3 kg (121 lb 14.6 oz)    Examination:  General exam: Appears calm and comfortable Respiratory system: Clear to auscultation. Respiratory effort normal. Cardiovascular system: S1 & S2 heard, RRR. No murmurs Gastrointestinal system: Abdomen is nondistended, soft and mildly tender. No organomegaly or masses felt. Decreased bowel sounds heard. Dressings intact and clean. Central nervous system: Alert and oriented. No focal neurological deficits. Extremities: No edema. No calf tenderness Skin: No cyanosis. No rashes Psychiatry: Judgement and insight appear normal. Mood &  affect appropriate.    Data Reviewed: I have personally reviewed following labs and imaging studies  CBC: Recent Labs  Lab 08/16/17 1532  08/18/17 0455  08/19/17 0711 08/20/17 0523 08/20/17 1209 08/21/17 0533  08/22/17 0725  WBC 10.9*  --  10.2  --   --  8.7  --  9.1 11.5*  NEUTROABS 7.8*  --   --   --   --   --   --   --   --   HGB 7.6*   < > 8.7*   < > 9.2* 8.0* 8.9* 8.1* 9.6*  HCT 24.7*   < > 28.1*   < > 29.3* 25.4* 28.7* 25.8* 29.5*  MCV 90.5  --  89.8  --   --  89.8  --  89.6 86.8  PLT 315  --  282  --   --  268  --  259 230   < > = values in this interval not displayed.   Basic Metabolic Panel: Recent Labs  Lab 08/16/17 1532 08/20/17 0523 08/21/17 0533 08/22/17 0725  NA 140 141 137 136  K 4.0 3.8 4.3 4.6  CL 102 108 105 105  CO2 28 24 25 23   GLUCOSE 152* 122* 124* 180*  BUN 35* 22 15 20   CREATININE 0.88 0.85 0.77 0.92  CALCIUM 8.8* 8.2* 8.2* 7.7*   GFR: Estimated Creatinine Clearance: 31.9 mL/min (by C-G formula based on SCr of 0.92 mg/dL). Liver Function Tests: Recent Labs  Lab 08/16/17 1532  AST 25  ALT 17  ALKPHOS 41  BILITOT 0.4  PROT 7.4  ALBUMIN 2.9*   No results for input(s): LIPASE, AMYLASE in the last 168 hours. No results for input(s): AMMONIA in the last 168 hours. Coagulation Profile: Recent Labs  Lab 08/16/17 1532 08/21/17 0533  INR 1.07 1.15   Cardiac Enzymes: No results for input(s): CKTOTAL, CKMB, CKMBINDEX, TROPONINI in the last 168 hours. BNP (last 3 results) No results for input(s): PROBNP in the last 8760 hours. HbA1C: Recent Labs    08/20/17 2233  HGBA1C 6.4*   CBG: Recent Labs  Lab 08/22/17 1622 08/22/17 1959 08/22/17 2333 08/23/17 0340 08/23/17 0725  GLUCAP 105* 114* 125* 83 85   Lipid Profile: No results for input(s): CHOL, HDL, LDLCALC, TRIG, CHOLHDL, LDLDIRECT in the last 72 hours. Thyroid Function Tests: No results for input(s): TSH, T4TOTAL, FREET4, T3FREE, THYROIDAB in the last 72 hours. Anemia Panel: No results for input(s): VITAMINB12, FOLATE, FERRITIN, TIBC, IRON, RETICCTPCT in the last 72 hours. Sepsis Labs: No results for input(s): PROCALCITON, LATICACIDVEN in the last 168 hours.  Recent Results (from the  past 240 hour(s))  Surgical pcr screen     Status: None   Collection Time: 08/21/17  5:16 AM  Result Value Ref Range Status   MRSA, PCR NEGATIVE NEGATIVE Final   Staphylococcus aureus NEGATIVE NEGATIVE Final    Comment: (NOTE) The Xpert SA Assay (FDA approved for NASAL specimens in patients 92 years of age and older), is one component of a comprehensive surveillance program. It is not intended to diagnose infection nor to guide or monitor treatment. Performed at [facility], [place] [address], [place], [place] [address]          Radiology Studies: Dg Chest Port 1 View  Result Date: 08/23/2017 CLINICAL DATA:  Productive cough. EXAM: PORTABLE CHEST 1 VIEW COMPARISON:  August 20, 2017 FINDINGS: There is a background of increased interstitial markings consistent with known interstitial lung disease. However,  the interstitial opacity has increased in the interval. Mild opacities in the bases may be associated with small effusions. Mildly more focal opacity seen in the left mid lung as well. The heart size borderline. The hila and mediastinum are unremarkable. No nodules or masses. No focal infiltrates. IMPRESSION: 1. The findings are suggestive of pulmonary edema seen on the background of known interstitial lung disease. 2. Bibasilar opacities are likely associated with small effusions and may represent atelectasis versus infiltrate. 3. More focal opacity in the left mid lung could represent asymmetric edema versus underlying developing infiltrate. Electronically Signed   By: Dorise Bullion III M.D   On: 08/23/2017 07:04        Scheduled Meds: . alvimopan  12 mg Oral BID  . insulin aspart  0-9 Units Subcutaneous Q4H  . mouth rinse  15 mL Mouth Rinse BID  . metoprolol tartrate  2.5 mg Intravenous Q6H   Continuous Infusions: . famotidine (PEPCID) IV Stopped (08/22/17 2246)  . methocarbamol (ROBAXIN)  IV Stopped (08/23/17 0726)     LOS: 7 days     Cordelia Poche,  MD [facility] 08/23/2017, 9:33 AM Pager: [phone]  If 7PM-7AM, please contact night-coverage [email or web address] 08/23/2017, 9:33 AM

## 2017-08-23 NOTE — Evaluation (Signed)
Physical Therapy Evaluation Patient Details Name: [name] MRN: [id number] DOB: 12/12/1920 Today's Date: 08/23/2017   History of Present Illness  84 y.o. female was admitted to hosp with anemia, constipation and then R hemicolectomy from previous R colon mass.  PMHx:  CAD, DM, PAF, LE weakness  Clinical Impression  Pt was able to walk with care of hemicolectomy using gait belt, family in to observe and to discuss her lack of follow up help with any of them. Will recommend short stay in SNF but may not be able to live alone after that.  Pt was on SPC at times but is not clear on her plan nor is family necessarily decided about follow up.  See acutely for strengthening and recommend balance and gait training as tolerated.    Follow Up Recommendations SNF    Equipment Recommendations  None recommended by PT    Recommendations for Other Services       Precautions / Restrictions Precautions Precautions: Fall Restrictions Weight Bearing Restrictions: No      Mobility  Bed Mobility Overal bed mobility: Needs Assistance Bed Mobility: Supine to Sit     Supine to sit: Min guard     General bed mobility comments: for safety  Transfers Overall transfer level: Needs assistance Equipment used: None Transfers: Sit to/from Stand Sit to Stand: Min guard;Min assist            Ambulation/Gait Ambulation/Gait assistance: Min guard Gait Distance (Feet): 90 Feet Assistive device: Rolling walker (2 wheeled) Gait Pattern/deviations: Step-through pattern;Decreased stride length;Trunk flexed Gait velocity: reduced Gait velocity interpretation: <1.31 ft/sec, indicative of household ambulator General Gait Details:  wide based gait with pt requiring directional assistance, prompts tostand up straight on the walker  Stairs            Wheelchair Mobility    Modified Rankin (Stroke Patients Only)       Balance Overall balance assessment: Needs assistance Sitting-balance  support: Feet supported Sitting balance-Leahy Scale: Good   Postural control: Posterior lean Standing balance support: Bilateral upper extremity supported;During functional activity Standing balance-Leahy Scale: Fair                               Pertinent Vitals/Pain      Home Living Family/patient expects to be discharged to:: Private residence Living Arrangements: Alone Available Help at Discharge: Friend(s);Available PRN/intermittently Type of Home: House Home Access: Level entry;Other (comment)     Home Layout: Able to live on main level with bedroom/bathroom;Multi-level Home Equipment: Walker - 2 wheels;Cane - single point      Prior Function Level of Independence: Independent         Comments: SPC only briefly     Hand Dominance        Extremity/Trunk Assessment   Upper Extremity Assessment Upper Extremity Assessment: Generalized weakness    Lower Extremity Assessment Lower Extremity Assessment: Generalized weakness    Cervical / Trunk Assessment Cervical / Trunk Assessment: Kyphotic  Communication   Communication: No difficulties  Cognition     Overall Cognitive Status: Within Functional Limits for tasks assessed                                        General Comments General comments (skin integrity, edema, etc.): 97% O2 sat after walking until SOB    Exercises  Assessment/Plan    PT Assessment Patient needs continued PT services  PT Problem List Decreased strength;Decreased range of motion;Decreased activity tolerance;Decreased balance;Decreased mobility;Decreased coordination;Decreased knowledge of use of DME       PT Treatment Interventions DME instruction;Gait training;Stair training;Functional mobility training;Therapeutic activities;Therapeutic exercise;Balance training;Neuromuscular re-education;Patient/family education    PT Goals (Current goals can be found in the Care Plan section)  Acute Rehab  PT Goals Patient Stated Goal: to get stronger PT Goal Formulation: With patient Time For Goal Achievement: 09/06/17 Potential to Achieve Goals: Good    Frequency Min 3X/week   Barriers to discharge Inaccessible home environment has stairs to ascend the upstairs of her house    Co-evaluation               AM-PAC PT "6 Clicks" Daily Activity  Outcome Measure Difficulty turning over in bed (including adjusting bedclothes, sheets and blankets)?: A Little Difficulty moving from lying on back to sitting on the side of the bed? : A Little Difficulty sitting down on and standing up from a chair with arms (e.g., wheelchair, bedside commode, etc,.)?: Unable Help needed moving to and from a bed to chair (including a wheelchair)?: Total Help needed walking in hospital room?: Total   6 Click Score: 9    End of Session Equipment Utilized During Treatment: Gait belt Activity Tolerance: Patient tolerated treatment well Patient left: in bed;with call bell/phone within reach;with bed alarm set;with family/visitor present   PT Visit Diagnosis: Unsteadiness on feet (R26.81);Repeated falls (R29.6);Muscle weakness (generalized) (M62.81)    Time: 9767-3419 PT Time Calculation (min) (ACUTE ONLY): 36 min   Charges:   PT Evaluation $PT Eval Moderate Complexity: 1 Mod PT Treatments $Gait Training: 8-22 mins   PT G Codes:   PT G-Codes **NOT FOR INPATIENT CLASS** Functional Assessment Tool Used: AM-PAC 6 Clicks Basic Mobility Functional Limitation: Mobility: Walking and moving around    Ramond Dial 08/23/2017, 11:36 PM   Mee Hives, PT MS Acute Rehab Dept. Number: [place] [facility] [place]

## 2017-08-23 NOTE — Progress Notes (Signed)
2 Days Post-Op   Subjective/Chief Complaint: Had bm, sore   Objective: Vital signs in last 24 hours: Temp:  [97.3 F (36.3 C)-97.9 F (36.6 C)] 97.9 F (36.6 C) (07/13 0731) Pulse Rate:  [73-81] 81 (07/13 0345) Resp:  [15-21] 20 (07/13 0345) BP: (145-173)/(56-97) 161/71 (07/13 0345) SpO2:  [89 %-97 %] 92 % (07/13 0345) Weight:  [55.3 kg (121 lb 14.6 oz)] 55.3 kg (121 lb 14.6 oz) (07/13 0500) Last BM Date: 08/20/17  Intake/Output from previous day: 07/12 0701 - 07/13 0700 In: 1860.8 [I.V.:1642.5; IV Piggyback:218.3] Out: 45 [Urine:45] Intake/Output this shift: No intake/output data recorded.  GI: soft approp tender dressings dry some bs present  Lab Results:  Recent Labs    08/21/17 0533 08/22/17 0725  WBC 9.1 11.5*  HGB 8.1* 9.6*  HCT 25.8* 29.5*  PLT 259 230   BMET Recent Labs    08/21/17 0533 08/22/17 0725  NA 137 136  K 4.3 4.6  CL 105 105  CO2 25 23  GLUCOSE 124* 180*  BUN 15 20  CREATININE 0.77 0.92  CALCIUM 8.2* 7.7*   PT/INR Recent Labs    08/21/17 0533  LABPROT 14.6  INR 1.15   ABG No results for input(s): PHART, HCO3 in the last 72 hours.  Invalid input(s): PCO2, PO2  Studies/Results: Dg Chest Port 1 View  Result Date: 08/23/2017 CLINICAL DATA:  Productive cough. EXAM: PORTABLE CHEST 1 VIEW COMPARISON:  August 20, 2017 FINDINGS: There is a background of increased interstitial markings consistent with known interstitial lung disease. However, the interstitial opacity has increased in the interval. Mild opacities in the bases may be associated with small effusions. Mildly more focal opacity seen in the left mid lung as well. The heart size borderline. The hila and mediastinum are unremarkable. No nodules or masses. No focal infiltrates. IMPRESSION: 1. The findings are suggestive of pulmonary edema seen on the background of known interstitial lung disease. 2. Bibasilar opacities are likely associated with small effusions and may represent  atelectasis versus infiltrate. 3. More focal opacity in the left mid lung could represent asymmetric edema versus underlying developing infiltrate. Electronically Signed   By: Dorise Bullion III M.D   On: 08/23/2017 07:04    Anti-infectives: Anti-infectives (From admission, onward)   Start     Dose/Rate Route Frequency Ordered Stop   08/21/17 0600  cefoTEtan (CEFOTAN) 2 g in sodium chloride 0.9 % 100 mL IVPB     2 g 200 mL/hr over 30 Minutes Intravenous On call to O.R. 08/20/17 2208 08/21/17 1159      Assessment/Plan: POD 2  lap assisted R hemicolectomy 08/21/17 Dr. Hassell Done -clears today -path pending -pulm toilet, oob Anemia secondary to bleeding from the colon mass - recheck in amcontinue to monitor, she can be on pharm dvt proph from my standpoint if ok with medical team       Rolm Bookbinder 08/23/2017

## 2017-08-23 NOTE — Progress Notes (Signed)
Pt developed hallucinations this am stating, "that is a beautiful skirt there in the corner." When lights turned on and pt questioned further she again confirms that she is seeing a skirt that isn't there. Will pass along to oncoming nurse.

## 2017-08-24 LAB — BASIC METABOLIC PANEL
ANION GAP: 7 (ref 5–15)
BUN: 18 mg/dL (ref 8–23)
CHLORIDE: 108 mmol/L (ref 98–111)
CO2: 26 mmol/L (ref 22–32)
Calcium: 7.9 mg/dL — ABNORMAL LOW (ref 8.9–10.3)
Creatinine, Ser: 0.85 mg/dL (ref 0.44–1.00)
GFR calc Af Amer: >60 mL/min (ref >60)
GFR, EST NON AFRICAN AMERICAN: 56 mL/min — AB (ref >60)
GLUCOSE: 83 mg/dL (ref 70–99)
POTASSIUM: 4.4 mmol/L (ref 3.5–5.1)
Sodium: 141 mmol/L (ref 135–145)

## 2017-08-24 LAB — GLUCOSE, CAPILLARY
GLUCOSE-CAPILLARY: 85 mg/dL (ref 70–99)
Glucose-Capillary: 80 mg/dL (ref 70–99)
Glucose-Capillary: 89 mg/dL (ref 70–99)
Glucose-Capillary: 167 mg/dL — ABNORMAL HIGH (ref 70–99)
Glucose-Capillary: 158 mg/dL — ABNORMAL HIGH (ref 70–99)
Glucose-Capillary: 137 mg/dL — ABNORMAL HIGH (ref 70–99)

## 2017-08-24 LAB — CBC
HEMATOCRIT: 28 % — AB (ref 36.0–46.0)
HEMOGLOBIN: 9 g/dL — AB (ref 12.0–15.0)
MCH: 28.8 pg (ref 26.0–34.0)
MCHC: 32.1 g/dL (ref 30.0–36.0)
MCV: 89.5 fL (ref 78.0–100.0)
PLATELETS: 227 10*3/uL (ref 150–400)
RBC: 3.13 MIL/uL — AB (ref 3.87–5.11)
RDW: 15.4 % (ref 11.5–15.5)
WBC: 9.2 10*3/uL (ref 4.0–10.5)

## 2017-08-24 MED ORDER — BISMUTH SUBSALICYLATE 262 MG/15ML PO SUSP
30.0000 mL | Freq: Three times a day (TID) | ORAL | Status: DC | PRN
  Filled 2017-08-24: qty 236

## 2017-08-24 MED ORDER — INSULIN ASPART 100 UNIT/ML ~~LOC~~ SOLN
0.0000 [IU] | Freq: Three times a day (TID) | SUBCUTANEOUS | Status: DC
  Administered 2017-08-25: 2 [IU] via SUBCUTANEOUS
  Administered 2017-08-25 – 2017-08-26 (×2): 1 [IU] via SUBCUTANEOUS

## 2017-08-24 MED ORDER — METOPROLOL TARTRATE 25 MG PO TABS
25.0000 mg | ORAL_TABLET | Freq: Two times a day (BID) | ORAL | Status: DC
  Administered 2017-08-24 – 2017-08-26 (×4): 25 mg via ORAL
  Filled 2017-08-24 (×4): qty 1

## 2017-08-24 MED ORDER — INSULIN ASPART 100 UNIT/ML ~~LOC~~ SOLN: 0.0000 [IU] | Freq: Every day | SUBCUTANEOUS | Status: DC

## 2017-08-24 MED ORDER — METHOCARBAMOL 500 MG PO TABS: 500.0000 mg | ORAL_TABLET | Freq: Three times a day (TID) | ORAL | Status: DC | PRN

## 2017-08-24 NOTE — Progress Notes (Signed)
PROGRESS NOTE    [name]  YPP:[id number] DOB: [birth year] DOA: 08/16/2017 PCP: Eulas Post, MD   Brief Narrative: [name] is a 84 y.o. femalewith medical history significant ofcoronary artery disease,paroxysmal A. fib ,diet-controlled type 2 diabetes. She presented with fatigue and found to have symptomatic anemia secondary to colonic mass (confirmed adenocarcinoma) +/- diverticulosis. S/p right colectomy. Advancing diet slowly.   Assessment & Plan:   Principal Problem:   Cancer of ascending colon with bleeding s/p right proximal colectomy 08/21/2017 Active Problems:   Type 2 diabetes mellitus, controlled (HCC)   Hyperlipidemia   Symptomatic anemia   Essential hypertension   CAD (coronary artery disease)   PAF (paroxysmal atrial fibrillation) (HCC)   Carotid artery disease (HCC)   ILD (interstitial lung disease) ([place])   Chronic diastolic (congestive) heart failure (HCC)   Blood loss anemia   Fatigue associated with anemia   History of TIA (transient ischemic attack)   Gastrointestinal hemorrhage   Protein-calorie malnutrition, severe   Colonic obstruction by tumor s/p colectomy 08/21/2017   Acute blood loss anemia In setting of colonic mass. Concern for malignancy. Patient is s/p 1 units of PRBC. Colonoscopy significant for mass and diverticulosis, but no active bleeding. Hemoglobin stable.  Adenocarcinoma of the colon Confirmed on biopsy. S/p right colectomy on 7/11. Discussed case with medical oncology, and no role for medical therapy. Recommend palliative care. Discussed with patient. -General surgery recommendations: advancing diet (advanced to dysphagia 1 diet yesterday by [facility]) -Palliative care recommendations  Type 2 diabetes, diet controlled Diet controlled -Continue SSI  Hyperlipidemia -Continue Lipitor  CAD On plavix and lipitor as an outpatient. Plavix on hold secondary to GI bleeding -Continue Lipitor  History of TIA -Continue  Lipitor  Chronic diastolic heart dysfunction Euvolemic.  Paroxysmal atrial fibrillation Rate controlled. On metoprolol as an outpateint. Not on anticoagulation -Continue metoprolol  Delirium Improved since being out of the ICU.   DVT prophylaxis: SCDs Code Status:   Code Status: DNR Family Communication: None at bedside Disposition Plan: Discharge to SNF pending general surgery/goals of care   Consultants:   Gastroenterology  General surgery  Palliative care  Procedures:   7/6: 1 unit PRBC  7/9: Colonoscopy  7/11: Right colectomy  Antimicrobials:  None    Subjective: Sad about her room change.  Objective: Vitals:   08/24/17 0124 08/24/17 0410 08/24/17 0500 08/24/17 0652  BP:  116/72  (!) 151/66  Pulse:  92  74  Resp:  14    Temp:  97.7 F (36.5 C)    TempSrc:  Oral    SpO2: 94% 95%    Weight:   54 kg (119 lb 0.8 oz)   Height:        Intake/Output Summary (Last 24 hours) at 08/24/2017 0858 Last data filed at 08/24/2017 0710 Gross per 24 hour  Intake 838.66 ml  Output 1450 ml  Net -611.34 ml   Filed Weights   08/22/17 0500 08/23/17 0500 08/24/17 0500  Weight: 54 kg (119 lb 0.8 oz) 55.3 kg (121 lb 14.6 oz) 54 kg (119 lb 0.8 oz)    Examination:  General exam: Appears calm and comfortable Respiratory system: Clear to auscultation. Respiratory effort normal. Cardiovascular system: S1 & S2 heard, RRR. No murmurs. Gastrointestinal system: Abdomen is nondistended, soft and mildly tender. Normal bowel sounds heard. Central nervous system: Alert and oriented. No focal neurological deficits. Extremities: No edema. No calf tenderness Skin: No cyanosis. No rashes Psychiatry: Judgement and insight appear normal.  Mood & affect appropriate.   Data Reviewed: I have personally reviewed following labs and imaging studies  CBC: Recent Labs  Lab 08/18/17 0455  08/20/17 0523 08/20/17 1209 08/21/17 0533 08/22/17 0725 08/24/17 0520  WBC 10.2  --  8.7   --  9.1 11.5* 9.2  HGB 8.7*   < > 8.0* 8.9* 8.1* 9.6* 9.0*  HCT 28.1*   < > 25.4* 28.7* 25.8* 29.5* 28.0*  MCV 89.8  --  89.8  --  89.6 86.8 89.5  PLT 282  --  268  --  259 230 227   < > = values in this interval not displayed.   Basic Metabolic Panel: Recent Labs  Lab 08/20/17 0523 08/21/17 0533 08/22/17 0725 08/24/17 0520  NA 141 137 136 141  K 3.8 4.3 4.6 4.4  CL 108 105 105 108  CO2 24 25 23 26   GLUCOSE 122* 124* 180* 83  BUN 22 15 20 18   CREATININE 0.85 0.77 0.92 0.85  CALCIUM 8.2* 8.2* 7.7* 7.9*   GFR: Estimated Creatinine Clearance: 33.8 mL/min (by C-G formula based on SCr of 0.85 mg/dL). Liver Function Tests: No results for input(s): AST, ALT, ALKPHOS, BILITOT, PROT, ALBUMIN in the last 168 hours. No results for input(s): LIPASE, AMYLASE in the last 168 hours. No results for input(s): AMMONIA in the last 168 hours. Coagulation Profile: Recent Labs  Lab 08/21/17 0533  INR 1.15   Cardiac Enzymes: No results for input(s): CKTOTAL, CKMB, CKMBINDEX, TROPONINI in the last 168 hours. BNP (last 3 results) No results for input(s): PROBNP in the last 8760 hours. HbA1C: No results for input(s): HGBA1C in the last 72 hours. CBG: Recent Labs  Lab 08/23/17 1656 08/23/17 2050 08/24/17 0011 08/24/17 0348 08/24/17 0816  GLUCAP 132* 133* 85 80 89   Lipid Profile: No results for input(s): CHOL, HDL, LDLCALC, TRIG, CHOLHDL, LDLDIRECT in the last 72 hours. Thyroid Function Tests: No results for input(s): TSH, T4TOTAL, FREET4, T3FREE, THYROIDAB in the last 72 hours. Anemia Panel: No results for input(s): VITAMINB12, FOLATE, FERRITIN, TIBC, IRON, RETICCTPCT in the last 72 hours. Sepsis Labs: No results for input(s): PROCALCITON, LATICACIDVEN in the last 168 hours.  Recent Results (from the past 240 hour(s))  Surgical pcr screen     Status: None   Collection Time: 08/21/17  5:16 AM  Result Value Ref Range Status   MRSA, PCR NEGATIVE NEGATIVE Final   Staphylococcus  aureus NEGATIVE NEGATIVE Final    Comment: (NOTE) The Xpert SA Assay (FDA approved for NASAL specimens in patients 64 years of age and older), is one component of a comprehensive surveillance program. It is not intended to diagnose infection nor to guide or monitor treatment. Performed at [facility], [place] [address], [place], [place] [address]          Radiology Studies: Dg Chest Port 1 View  Result Date: 08/23/2017 CLINICAL DATA:  Productive cough. EXAM: PORTABLE CHEST 1 VIEW COMPARISON:  August 20, 2017 FINDINGS: There is a background of increased interstitial markings consistent with known interstitial lung disease. However, the interstitial opacity has increased in the interval. Mild opacities in the bases may be associated with small effusions. Mildly more focal opacity seen in the left mid lung as well. The heart size borderline. The hila and mediastinum are unremarkable. No nodules or masses. No focal infiltrates. IMPRESSION: 1. The findings are suggestive of pulmonary edema seen on the background of known interstitial lung disease. 2. Bibasilar opacities are likely associated with  small effusions and may represent atelectasis versus infiltrate. 3. More focal opacity in the left mid lung could represent asymmetric edema versus underlying developing infiltrate. Electronically Signed   By: Dorise Bullion III M.D   On: 08/23/2017 07:04        Scheduled Meds: . acetaminophen  1,000 mg Oral TID  . aspirin EC  81 mg Oral Daily  . gabapentin  100 mg Oral QHS  . insulin aspart  0-9 Units Subcutaneous Q4H  . lip balm  1 application Topical BID  . mouth rinse  15 mL Mouth Rinse BID  . metoprolol tartrate  2.5 mg Intravenous Q6H  . pantoprazole  20 mg Oral Daily  . psyllium  1 packet Oral Daily  . saccharomyces boulardii  250 mg Oral BID  . sodium chloride flush  3 mL Intravenous Q12H  . vitamin C  500 mg Oral Daily   Continuous Infusions: . sodium chloride      . lactated ringers    . methocarbamol (ROBAXIN)  IV 500 mg (08/24/17 0653)  . ondansetron (ZOFRAN) IV       LOS: 8 days     Cordelia Poche, MD [facility] 08/24/2017, 8:58 AM Pager: [phone]  If 7PM-7AM, please contact night-coverage [email or web address] 08/24/2017, 8:58 AM

## 2017-08-24 NOTE — Evaluation (Signed)
Occupational Therapy Evaluation Patient Details Name: [name] MRN: [id number] DOB: 11/04/1920 Today's Date: 08/24/2017    History of Present Illness 84 y.o. female was admitted to hosp with anemia, constipation and then R hemicolectomy from previous R colon mass.  PMHx:  CAD, DM, PAF, LE weakness   Clinical Impression   Pt stood at the sink to perform grooming tasks and was fatigued after approximately 8 minutes of standing activity so pt returned to bed. Pt needs cues for safety including hand placement on bar to help stand at the commode and for safety with sitting down on EOB safely. Feel pt will benefit from continued OT to progress ADL independence for next venue.     Follow Up Recommendations  SNF;Supervision/Assistance - 24 hour    Equipment Recommendations  Other (comment)(TBA next venue)    Recommendations for Other Services       Precautions / Restrictions Precautions Precautions: Fall Restrictions Weight Bearing Restrictions: No      Mobility Bed Mobility Overal bed mobility: Needs Assistance Bed Mobility: Supine to Sit;Sit to Supine     Supine to sit: Min guard Sit to supine: Min assist   General bed mobility comments: cues for technique. min assist to bring LEs onto bed.   Transfers Overall transfer level: Needs assistance Equipment used: Rolling walker (2 wheeled) Transfers: Sit to/from Stand Sit to Stand: Min assist         General transfer comment: min assist to rise from commode using bar.     Balance   Sitting-balance support: Feet supported Sitting balance-Leahy Scale: Good       Standing balance-Leahy Scale: Fair                             ADL either performed or assessed with clinical judgement   ADL Overall ADL's : Needs assistance/impaired Eating/Feeding: Set up;Sitting   Grooming: Wash/dry face;Wash/dry hands;Oral care;Min guard;Standing   Upper Body Bathing: Set up;Sitting   Lower Body Bathing: Minimal  assistance;Sit to/from stand   Upper Body Dressing : Set up;Sitting   Lower Body Dressing: Sit to/from stand;Moderate assistance   Toilet Transfer: Minimal assistance;Ambulation;Comfort height toilet;RW;Grab bars   Toileting- Clothing Manipulation and Hygiene: Minimal assistance;Sit to/from stand       Functional mobility during ADLs: Minimal assistance;Rolling walker;Cueing for safety General ADL Comments: Pt with difficulty rising from commode in bathroom and needed min cues to use rail to help pull up to standing rather. She required min assist to rise from commode also. She tends to sit back on EOb without reaching back first so needs min cues for safety. She stood at the sink for approximately 8 minutes to perform grooming tasks but stated she felt fatigued afterward and needed to return to bed.      Vision Patient Visual Report: No change from baseline       Perception     Praxis      Pertinent Vitals/Pain Pain Assessment: No/denies pain     Hand Dominance     Extremity/Trunk Assessment Upper Extremity Assessment Upper Extremity Assessment: Overall WFL for tasks assessed           Communication Communication Communication: No difficulties   Cognition Arousal/Alertness: Awake/alert Behavior During Therapy: WFL for tasks assessed/performed Overall Cognitive Status: Within Functional Limits for tasks assessed  General Comments       Exercises     Shoulder Instructions      Home Living Family/patient expects to be discharged to:: Private residence Living Arrangements: Alone Available Help at Discharge: Friend(s);Available PRN/intermittently Type of Home: House Home Access: Level entry;Other (comment)     Home Layout: Able to live on main level with bedroom/bathroom;Multi-level Alternate Level Stairs-Number of Steps: 14 up anddown to basement Alternate Level Stairs-Rails: Right;Left;Can reach both      Bathroom Toilet: Handicapped height     Home Equipment: Walker - 2 wheels;Cane - single point          Prior Functioning/Environment Level of Independence: Independent        Comments: SPC only briefly        OT Problem List: Decreased strength;Decreased activity tolerance;Decreased knowledge of use of DME or AE;Decreased safety awareness      OT Treatment/Interventions: Self-care/ADL training;DME and/or AE instruction;Therapeutic activities;Patient/family education    OT Goals(Current goals can be found in the care plan section) Acute Rehab OT Goals Patient Stated Goal: to get stronger OT Goal Formulation: With patient Time For Goal Achievement: 09/07/17 Potential to Achieve Goals: Good  OT Frequency: Min 2X/week   Barriers to D/C:            Co-evaluation              AM-PAC PT "6 Clicks" Daily Activity     Outcome Measure Help from another person eating meals?: None Help from another person taking care of personal grooming?: A Little Help from another person toileting, which includes using toliet, bedpan, or urinal?: A Little Help from another person bathing (including washing, rinsing, drying)?: A Little Help from another person to put on and taking off regular upper body clothing?: A Little Help from another person to put on and taking off regular lower body clothing?: A Lot 6 Click Score: 18   End of Session Equipment Utilized During Treatment: Rolling walker  Activity Tolerance: Patient tolerated treatment well Patient left: in bed;with call bell/phone within reach;with bed alarm set  OT Visit Diagnosis: Muscle weakness (generalized) (M62.81)                Time: 6237-6283 OT Time Calculation (min): 22 min Charges:  OT General Charges $OT Visit: 1 Visit OT Evaluation $OT Eval Low Complexity: 1 Low G-Codes:       Jae Dire Bernadett Milian 08/24/2017, 10:27 AM

## 2017-08-24 NOTE — Progress Notes (Signed)
Bed offers provided to pt and pt son at bedside- choose [facility] at this time- facility informed of pt preference  CSW will continue to follow  Jorge Ny MSW, LCSW [facility] #: [phone]

## 2017-08-24 NOTE — Progress Notes (Signed)
3 Days Post-Op   Subjective/Chief Complaint: Having bowel function, tol reg diet given to her by trh   Objective: Vital signs in last 24 hours: Temp:  [97.3 F (36.3 C)-97.9 F (36.6 C)] 97.7 F (36.5 C) (07/14 0410) Pulse Rate:  [74-95] 74 (07/14 0652) Resp:  [14-16] 14 (07/14 0410) BP: (116-151)/(58-72) 151/66 (07/14 0652) SpO2:  [85 %-95 %] 95 % (07/14 0410) Weight:  [54 kg (119 lb 0.8 oz)] 54 kg (119 lb 0.8 oz) (07/14 0500) Last BM Date: 08/23/17  Intake/Output from previous day: 07/13 0701 - 07/14 0700 In: 913.7 [P.O.:560; I.V.:75; IV Piggyback:278.7] Out: 1350 [Urine:1350] Intake/Output this shift: Total I/O In: -  Out: 100 [Urine:100]  GI; soft approp tender nondistended dressing dry bs present  Lab Results:  Recent Labs    08/22/17 0725 08/24/17 0520  WBC 11.5* 9.2  HGB 9.6* 9.0*  HCT 29.5* 28.0*  PLT 230 227   BMET Recent Labs    08/22/17 0725 08/24/17 0520  NA 136 141  K 4.6 4.4  CL 105 108  CO2 23 26  GLUCOSE 180* 83  BUN 20 18  CREATININE 0.92 0.85  CALCIUM 7.7* 7.9*   PT/INR No results for input(s): LABPROT, INR in the last 72 hours. ABG No results for input(s): PHART, HCO3 in the last 72 hours.  Invalid input(s): PCO2, PO2  Studies/Results: Dg Chest Port 1 View  Result Date: 08/23/2017 CLINICAL DATA:  Productive cough. EXAM: PORTABLE CHEST 1 VIEW COMPARISON:  August 20, 2017 FINDINGS: There is a background of increased interstitial markings consistent with known interstitial lung disease. However, the interstitial opacity has increased in the interval. Mild opacities in the bases may be associated with small effusions. Mildly more focal opacity seen in the left mid lung as well. The heart size borderline. The hila and mediastinum are unremarkable. No nodules or masses. No focal infiltrates. IMPRESSION: 1. The findings are suggestive of pulmonary edema seen on the background of known interstitial lung disease. 2. Bibasilar opacities are  likely associated with small effusions and may represent atelectasis versus infiltrate. 3. More focal opacity in the left mid lung could represent asymmetric edema versus underlying developing infiltrate. Electronically Signed   By: Dorise Bullion III M.D   On: 08/23/2017 07:04    Anti-infectives: Anti-infectives (From admission, onward)   Start     Dose/Rate Route Frequency Ordered Stop   08/21/17 0600  cefoTEtan (CEFOTAN) 2 g in sodium chloride 0.9 % 100 mL IVPB     2 g 200 mL/hr over 30 Minutes Intravenous On call to O.R. 08/20/17 2208 08/21/17 1159      Assessment/Plan: POD 3  lap assisted R hemicolectomy 08/21/17 Dr. Hassell Done -was advanced by someone to regular diet, tol fine -path pending -pulm toilet, oob Anemia secondary to bleeding from the colon mass-  she can be on pharm dvt proph from my standpoint if ok with medical team and would recommend that     Rolm Bookbinder 08/24/2017

## 2017-08-24 NOTE — Progress Notes (Signed)
SLP Cancellation Note  Patient Details Name: [name] MRN: [id number] DOB: 1920/07/30   Cancelled treatment:       Reason Eval/Treat Not Completed: Other (comment)(per notes, pt tolerating puree/thin diet, SLP will follow up next date for swallow evaluation.  Thanks for this consult!)   Luanna Salk, [place] [facility] SLP [phone]

## 2017-08-25 DIAGNOSIS — Z9889 Other specified postprocedural states: Secondary | ICD-10-CM

## 2017-08-25 DIAGNOSIS — Z882 Allergy status to sulfonamides status: Secondary | ICD-10-CM

## 2017-08-25 DIAGNOSIS — Z888 Allergy status to other drugs, medicaments and biological substances status: Secondary | ICD-10-CM

## 2017-08-25 LAB — GLUCOSE, CAPILLARY
GLUCOSE-CAPILLARY: 160 mg/dL — AB (ref 70–99)
Glucose-Capillary: 129 mg/dL — ABNORMAL HIGH (ref 70–99)
Glucose-Capillary: 154 mg/dL — ABNORMAL HIGH (ref 70–99)
Glucose-Capillary: 118 mg/dL — ABNORMAL HIGH (ref 70–99)
Glucose-Capillary: 152 mg/dL — ABNORMAL HIGH (ref 70–99)

## 2017-08-25 NOTE — Consult Note (Signed)
[place]  Telephone:(336) [phone] Fax:(336) [phone]     ID: [name] DOB: [birth year]  MR#: [id number]  JYN#:[id number]  Patient Care Team: Eulas Post, MD as PCP - General Nahser, Wonda Cheng, MD as PCP - Cardiology (Cardiology) Chauncey Cruel, MD OTHER MD:  CHIEF COMPLAINT: Colon cancer  CURRENT TREATMENT: Recovering from surgery   HISTORY OF CURRENT ILLNESS: [name] had several months of worsening weakness failure to thrive, right upper quadrant pain and weight loss (approximately 20 pounds over 6 months), which led her to the emergency room on 08/16/2017.  At that time her hemoglobin was 7.6, MCV 90.5, reticulocyte count 76.4, ferritin 19, with normal folate and B12.  She was admitted for transfusion and as part of the evaluation on 08/16/2017 a CT of the abdomen and pelvis was obtained.  This showed a large annular mass involving the ascending colon, with nonspecific pericolonic fat haziness and central mesenteric and porta hepatis lymphadenopathy.  The patient underwent colonoscopy under Dr. Silverio Decamp 08/20/2017 which found a partially obstructing mass in the ascending colon.  It measured 5 cm in length.  Biopsy showed (SZ B [id number]) invasive adenocarcinoma.  Because of concerns regarding obstruction the patient underwent a right partial colectomy under Dr. Hassell Done 08/21/2017.  This showed (SZ B [id number] 2509) invasive adenocarcinoma, measuring 4.6 cm, moderately differentiated, invading through the serosa into adherent omentum.  The resection margins were negative.  24 of 24 lymph nodes were negative. There was no evidence of perforation or lymphovascular invasion. MSI and IHC are not mentioned in this report  The patient's subsequent history is as detailed below.  INTERVAL HISTORY: I met with the patient and her son [name] in her hospital room on 08/25/2017  REVIEW OF SYSTEMS: [name] recovering well from her surgery.  She is eating heartily.  She tells me she is  still weak but getting a little bit stronger.  She is looking forward to going to [facility] tomorrow and hopes eventually to get back to independent living.  Currently she denies pain or bleeding.  She denies shortness of breath, chest pain or pressure.  There have been no unusual headaches, nausea, or vomiting.  A detailed review of systems was otherwise noncontributory  PAST MEDICAL HISTORY: Past Medical History:  Diagnosis Date  . ANEMIA, NORMOCYTIC 11/30/2009  . CAD (coronary artery disease) 04/27/2008   Non-Obstructive >> Cardiac catheterization 6/08: EF 55%, 1+ MR, Dx 50%, LAD 50%, dLAD 40%, OM1 50-60% (up to 70%), pOM 30%, pRCA 40%, mRCA 50-60%, pPDA 50-60% (up to 70%).  Medical therapy was recommended // MV 11/14: Normal stress nuclear study. LV Ejection Fraction: 67%.   . Carotid artery disease ([place])    Left CEA in 10/12.  Carotid dopplers (10/15) with 1-39% bilateral ICA stenosis. // Carotid US 10/17: R < 40; patent L CEA site  . COLONIC POLYPS, ADENOMATOUS 12/26/2009  . DIABETES MELLITUS, TYPE II 08/11/2006  . DIVERTICULOSIS, COLON 08/11/2006  . Fall 09/06/2016  . GERD 02/16/2007  . Hemorrhoids    s/p banding  . History of echocardiogram    Echo 5/18: EF 60-65, no RWMA, Gr 2 DD, mild-mod MR, mod LAE // Echo 2/17: Mild LVH, mild focal basal septal hypertrophy, EF 55-60%, no RWMA, Gr 1 DD, MAC, mild MR, mild  // Echo 4/14: EF 60-65%, normal wall motion, Gr 1 DD, MAC, mild MR, PASP 32, reduced excursion of AV noncoronary cusp.    Marland Kitchen HYPERLIPIDEMIA 09/09/2006  . HYPERTENSION 09/09/2006  .  LEFT BUNDLE BRANCH BLOCK 08/11/2006  . Open fracture of nasal bones   . OSTEOARTHRITIS 08/11/2006  . OSTEOPOROSIS 08/11/2006  . PAF (paroxysmal atrial fibrillation) ([place]) 04/12/2009   Paroxysmal, only prior documented episode was years ago.  She was not started on anticoagulation back then due to hemorrhoidal bleeding, apparently profuse.  Event monitor (3/14) showed only NSR.    Marland Kitchen Stroke ([place])  12-06-13  . Syncope    8/15. EEG (5/15) was unremarkable. // 4/18: ILR neg for arrhythmia - ? orthostatic (no prodrome reported) >> c/b SAH (small)   . TIA 09/02/2006   Echocardiogram 6/12: Mild to moderate MR, trace AI, trace TR, PASP 34, bubble study negative for intracardiac shunting, normal EF (greater than 55%) //  Possible TIA in 10/15 with visual field cut.  MRI (10/15) with no CVA. Linq monitor placed.    PAST SURGICAL HISTORY: Past Surgical History:  Procedure Laterality Date  . BIOPSY  08/20/2017   Procedure: BIOPSY;  Surgeon: Mauri Pole, MD;  Location: [facility];  Service: Endoscopy;;  . CAROTID ENDARTERECTOMY Left 12-05-2010  . CLOSED REDUCTION METATARSAL FRACTURE     right 5th  . COLONOSCOPY W/ POLYPECTOMY  2001  . COLONOSCOPY WITH PROPOFOL N/A 08/20/2017   Procedure: COLONOSCOPY WITH PROPOFOL;  Surgeon: Mauri Pole, MD;  Location: [facility];  Service: Endoscopy;  Laterality: N/A;  . EYE SURGERY     catarac  . LAPAROSCOPIC PARTIAL COLECTOMY Right 08/21/2017   Procedure: LAPAROSCOPIC RIGHT COLECTOMY;  Surgeon: Johnathan Hausen, MD;  Location: [facility] ORS;  Service: General;  Laterality: Right;  . LOOP RECORDER IMPLANT N/A 01/03/2014   Procedure: LOOP RECORDER IMPLANT;  Surgeon: Evans Lance, MD;  Location: [facility] CATH LAB;  Service: Cardiovascular;  Laterality: N/A;  . SCHLEROTHERAPY  08/20/2017   Procedure: Woodward Ku;  Surgeon: Mauri Pole, MD;  Location: [facility];  Service: Endoscopy;;  spot tattoo  . TONSILLECTOMY      FAMILY HISTORY Family History  Problem Relation Age of Onset  . Heart disease Mother 2  . Kidney disease Mother   . Heart attack Mother   . Congestive Heart Failure Father 20  . Colon cancer Brother   . Heart disease Brother   . Breast cancer Sister   . Uterine cancer Sister     SOCIAL HISTORY:  And used to work for [employer].  She is widowed.  She was living independently prior to her recent hospitalization.  She  attends [employer]    ADVANCED DIRECTIVES: The patient's son [name] is her healthcare power of attorney.  He can be reached at [phone].  He lives in [place].  The patient's other son lives 5 hours away.  HEALTH MAINTENANCE: Social History   Tobacco Use  . Smoking status: Never Smoker  . Smokeless tobacco: Never Used  Substance Use Topics  . Alcohol use: No    Alcohol/week: 0.0 oz  . Drug use: No     Colonoscopy: July 2019  PAP:  Bone density:   Allergies  Allergen Reactions  . Sulfamethoxazole-Trimethoprim     unknown  . Sulfonamide Derivatives     REACTION: rash  . Nitrofurantoin     Unknown. Macrobid.     Current Facility-Administered Medications  Medication Dose Route Frequency Provider Last Rate Last Dose  . 0.9 %  sodium chloride infusion  250 mL Intravenous PRN Michael Boston, MD      . acetaminophen (TYLENOL) tablet 1,000 mg  1,000 mg Oral TID Gross,  Remo Lipps, MD   1,000 mg at 08/25/17 1609  . alum & mag hydroxide-simeth (MAALOX/MYLANTA) 200-200-20 MG/5ML suspension 30 mL  30 mL Oral Q6H PRN Michael Boston, MD      . aspirin EC tablet 81 mg  81 mg Oral Daily Michael Boston, MD   81 mg at 08/25/17 0919  . bismuth subsalicylate (PEPTO BISMOL) 262 MG/15ML suspension 30 mL  30 mL Oral Q8H PRN Absher, Julieta Bellini, RPH      . fentaNYL (SUBLIMAZE) injection 25-50 mcg  25-50 mcg Intravenous Q2H PRN Michael Boston, MD      . gabapentin (NEURONTIN) capsule 100 mg  100 mg Oral Ardeen Fillers, MD   100 mg at 08/24/17 2159  . guaiFENesin-dextromethorphan (ROBITUSSIN DM) 100-10 MG/5ML syrup 10 mL  10 mL Oral Q4H PRN Michael Boston, MD      . hydrocortisone (ANUSOL-HC) 2.5 % rectal cream 1 application  1 application Topical QID PRN Michael Boston, MD      . hydrocortisone cream 1 % 1 application  1 application Topical TID PRN Michael Boston, MD      . insulin aspart (novoLOG) injection 0-5 Units  0-5 Units Subcutaneous QHS Bodenheimer, Charles A, NP      . insulin  aspart (novoLOG) injection 0-9 Units  0-9 Units Subcutaneous TID WC Bodenheimer, Charles A, NP   2 Units at 08/25/17 1229  . lactated ringers bolus 1,000 mL  1,000 mL Intravenous TID PRN Michael Boston, MD      . lip balm (CARMEX) ointment 1 application  1 application Topical BID Michael Boston, MD   1 application at 18/29/93 0919  . magic mouthwash  15 mL Oral QID PRN Michael Boston, MD      . MEDLINE mouth rinse  15 mL Mouth Rinse BID Michael Boston, MD   15 mL at 08/25/17 0919  . menthol-cetylpyridinium (CEPACOL) lozenge 3 mg  1 lozenge Oral PRN Michael Boston, MD      . methocarbamol (ROBAXIN) tablet 500 mg  500 mg Oral Q8H PRN Mariel Aloe, MD      . metoprolol tartrate (LOPRESSOR) tablet 25 mg  25 mg Oral BID Mariel Aloe, MD   25 mg at 08/25/17 0919  . ondansetron (ZOFRAN) injection 4 mg  4 mg Intravenous Q6H PRN Michael Boston, MD       Or  . ondansetron (ZOFRAN) 8 mg in sodium chloride 0.9 % 50 mL IVPB  8 mg Intravenous Q6H PRN Michael Boston, MD      . ondansetron [facility]) tablet 4 mg  4 mg Oral Q6H PRN Michael Boston, MD      . pantoprazole (PROTONIX) EC tablet 20 mg  20 mg Oral Daily Michael Boston, MD   20 mg at 08/25/17 0919  . phenol (CHLORASEPTIC) mouth spray 1-2 spray  1-2 spray Mouth/Throat PRN Michael Boston, MD      . prochlorperazine (COMPAZINE) injection 5-10 mg  5-10 mg Intravenous Q4H PRN Michael Boston, MD      . psyllium (HYDROCIL/METAMUCIL) packet 1 packet  1 packet Oral Daily Michael Boston, MD   1 packet at 08/25/17 [phone]  . saccharomyces boulardii (FLORASTOR) capsule 250 mg  250 mg Oral BID Michael Boston, MD   250 mg at 08/25/17 0919  . sodium chloride flush (NS) 0.9 % injection 3 mL  3 mL Intravenous Gorden Harms, MD   3 mL at 08/25/17 0924  . sodium chloride flush (NS) 0.9 % injection 3 mL  3 mL  Intravenous PRN Michael Boston, MD   3 mL at 08/25/17 0919  . traMADol (ULTRAM) tablet 50-100 mg  50-100 mg Oral Q6H PRN Michael Boston, MD      . vitamin C (ASCORBIC ACID)  tablet 500 mg  500 mg Oral Daily Michael Boston, MD   500 mg at 08/25/17 0919    OBJECTIVE: Elderly white woman examined in recliner  Vitals:   08/25/17 0513 08/25/17 1414  BP: (!) 159/78 (!) 177/77  Pulse: 72 69  Resp: 14 14  Temp: 98.5 F (36.9 C) 98 F (36.7 C)  SpO2: 91% 96%     Body mass index is 19.25 kg/m.   Wt Readings from Last 3 Encounters:  08/25/17 121 lb 1.6 oz (54.9 kg)  08/15/17 110 lb 11.2 oz (50.2 kg)  07/09/17 111 lb 4.8 oz (50.5 kg)    LAB RESULTS:  CMP     Component Value Date/Time   NA 141 08/24/2017 0520   K 4.4 08/24/2017 0520   CL 108 08/24/2017 0520   CO2 26 08/24/2017 0520   GLUCOSE 83 08/24/2017 0520   GLUCOSE 118 (H) 01/13/2006 1010   BUN 18 08/24/2017 0520   CREATININE 0.85 08/24/2017 0520   CREATININE 0.90 (H) 08/13/2017 1546   CALCIUM 7.9 (L) 08/24/2017 0520   PROT 7.4 08/16/2017 1532   ALBUMIN 2.9 (L) 08/16/2017 1532   AST 25 08/16/2017 1532   ALT 17 08/16/2017 1532   ALKPHOS 41 08/16/2017 1532   BILITOT 0.4 08/16/2017 1532   GFRNONAA 56 (L) 08/24/2017 0520   GFRAA >60 08/24/2017 0520    Lab Results  Component Value Date   TOTALPROTELP 7.3 03/10/2015   ALBUMINELP 3.6 (L) 03/10/2015   A1GS 0.4 (H) 03/10/2015   A2GS 1.1 (H) 03/10/2015   BETS 0.4 03/10/2015   BETA2SER 0.4 03/10/2015   GAMS 1.4 03/10/2015   SPEI SEE NOTE 03/10/2015    No results found for: KPAFRELGTCHN, LAMBDASER, KAPLAMBRATIO  Lab Results  Component Value Date   WBC 9.2 08/24/2017   NEUTROABS 7.8 (H) 08/16/2017   HGB 9.0 (L) 08/24/2017   HCT 28.0 (L) 08/24/2017   MCV 89.5 08/24/2017   PLT 227 08/24/2017    @LASTCHEMISTRY @  No results found for: LABCA2  No components found for: BTDVVO160  Recent Labs  Lab 08/21/17 0533  INR 1.15    No results found for: LABCA2  No results found for: VPX106  No results found for: YIR485  No results found for: IOE703  No results found for: CA2729  No components found for: HGQUANT  Lab Results    Component Value Date   CEA1 31.7 (H) 08/20/2017   /  CEA  Date Value Ref Range Status  08/20/2017 31.7 (H) 0.0 - 4.7 ng/mL Final    Comment:    (NOTE)                             Nonsmokers          <3.9                             Smokers             <5.6 Roche [employer]ostics Electrochemiluminescence Immunoassay (ECLIA) Values obtained with different assay methods or kits cannot be used interchangeably.  Results cannot be interpreted as absolute evidence of the presence or absence of malignant disease. Performed At: [facility]  [address] [place], [place] [id number] Rush Farmer MD RD:[id number]      No results found for: AFPTUMOR  No results found for: [place]  No results found for: PSA1  Admission on 08/16/2017  No results displayed because visit has over 200 results.      (this displays the last labs from the last 3 days)  Lab Results  Component Value Date   TOTALPROTELP 7.3 03/10/2015   ALBUMINELP 3.6 (L) 03/10/2015   A1GS 0.4 (H) 03/10/2015   A2GS 1.1 (H) 03/10/2015   BETS 0.4 03/10/2015   BETA2SER 0.4 03/10/2015   GAMS 1.4 03/10/2015   SPEI SEE NOTE 03/10/2015   (this displays SPEP labs)  No results found for: KPAFRELGTCHN, LAMBDASER, KAPLAMBRATIO (kappa/lambda light chains)  No results found for: HGBA, HGBA2QUANT, HGBFQUANT, HGBSQUAN (Hemoglobinopathy evaluation)   No results found for: LDH  Lab Results  Component Value Date   IRON 29 08/16/2017   TIBC 337 08/16/2017   IRONPCTSAT 9 (L) 08/16/2017   (Iron and TIBC)  Lab Results  Component Value Date   FERRITIN 19 08/16/2017    Urinalysis    Component Value Date/Time   COLORURINE YELLOW 02/14/2017 1553   APPEARANCEUR HAZY (A) 02/14/2017 1553   LABSPEC 1.017 02/14/2017 1553   PHURINE 5.0 02/14/2017 1553   GLUCOSEU NEGATIVE 02/14/2017 1553   HGBUR NEGATIVE 02/14/2017 1553   BILIRUBINUR NEGATIVE 02/14/2017 1553   BILIRUBINUR n 11/29/2013 1621   KETONESUR NEGATIVE  02/14/2017 1553   PROTEINUR NEGATIVE 02/14/2017 1553   UROBILINOGEN 0.2 12/01/2013 1606   NITRITE NEGATIVE 02/14/2017 1553   LEUKOCYTESUR LARGE (A) 02/14/2017 1553     STUDIES: Dg Chest 2 View  Result Date: 08/20/2017 CLINICAL DATA:  Preoperative evaluation for colon surgery EXAM: CHEST - 2 VIEW COMPARISON:  09/06/2016 FINDINGS: Loop recorder projects over lower medial LEFT chest. Normal heart size, mediastinal contours, and pulmonary vascularity. Hyperinflation and bronchitic changes question COPD. Biapical scarring. Diffuse interstitial prominence throughout both lungs similar to previous exam favoring pulmonary fibrosis. No definite acute infiltrate, pleural effusion or pneumothorax. Bones demineralized. IMPRESSION: Probable pulmonary fibrosis and question underlying COPD. No acute abnormalities. Electronically Signed   By: Lavonia Dana M.D.   On: 08/20/2017 16:11   Ct Abdomen Pelvis W Contrast  Result Date: 08/16/2017 CLINICAL DATA:  Anemia. GI bleed. Heme-positive stools. Intermittent right lower quadrant pain. EXAM: CT ABDOMEN AND PELVIS WITH CONTRAST TECHNIQUE: Multidetector CT imaging of the abdomen and pelvis was performed using the standard protocol following bolus administration of intravenous contrast. CONTRAST:  124m ISOVUE-300 IOPAMIDOL (ISOVUE-300) INJECTION 61% COMPARISON:  09/05/2016 CT pelvis. FINDINGS: Lower chest: Nonspecific patchy subpleural reticulation and ground-glass attenuation at both lung bases, not definitely changed since 03/16/2015. Coronary atherosclerosis. Hepatobiliary: Normal liver size. No liver mass. Mild diffuse intrahepatic biliary ductal dilatation. Dilated common bile duct (10 mm diameter). Faintly calcified gallstones in the nondistended gallbladder with no definite gallbladder wall thickening or pericholecystic fluid. Pancreas: Normal, with no mass or duct dilation. Spleen: Normal size. No mass. Adrenals/Urinary Tract: No discrete adrenal nodules. Normal  kidneys with no hydronephrosis and no renal mass. Normal bladder. Stomach/Bowel: Normal non-distended stomach. Normal caliber small bowel with no small bowel wall thickening. Normal appendix. There is a large annular colonic mass involving the entire ascending colon measuring 4.7 x 3.5 x 7.5 cm (series 2/image 36). There is associated haziness of the pericolonic fat in the ascending colon. Marked sigmoid diverticulosis, with no sigmoid wall thickening or pericolonic fat stranding. Vascular/Lymphatic: Atherosclerotic nonaneurysmal abdominal  aorta. Patent portal, splenic, hepatic and renal veins. A few mildly prominent rounded nodes in lateral right ascending colonic mesentery measuring up to 7 mm (series 2/image 32). Conglomerate central mesenteric adenopathy up to 2.1 cm (series 2/image 37). Enlarged 1.7 cm porta hepatis node (series 2/image 19). No pelvic adenopathy. Reproductive: Grossly normal uterus.  No adnexal mass. Other: No pneumoperitoneum, ascites or focal fluid collection. Musculoskeletal: No aggressive appearing focal osseous lesions. Severe T2 vertebral compression fracture of uncertain chronicity with 8 mm bony retropulsion into the anterior lumbar spinal canal. Moderate lumbar spondylosis. IMPRESSION: 1. Large annular colonic mass involving the entire ascending colon, highly suspicious for primary colonic malignancy. 2. Nonspecific pericolonic fat haziness associated with the ascending colonic mass, which could be due to tumor filtration or could be inflammatory due to wall perforation. No free air or abscess. No bowel obstruction. 3. Central mesenteric and porta hepatis lymphadenopathy suspicious for nodal metastatic disease. 4. Cholelithiasis. No evidence of acute cholecystitis. Mild intrahepatic and extrahepatic biliary ductal dilatation with CBD diameter 10 mm. Recommend correlation with serum bilirubin levels. MRCP may be obtained to evaluate for choledocholithiasis, as clinically warranted. 5.  Severe L2 vertebral compression fracture of uncertain chronicity. 6. Chronic findings include: Aortic Atherosclerosis (ICD10-I70.0). Coronary atherosclerosis. Marked sigmoid diverticulosis. Electronically Signed   By: Ilona Sorrel M.D.   On: 08/16/2017 17:23   Dg Chest Port 1 View  Result Date: 08/23/2017 CLINICAL DATA:  Productive cough. EXAM: PORTABLE CHEST 1 VIEW COMPARISON:  August 20, 2017 FINDINGS: There is a background of increased interstitial markings consistent with known interstitial lung disease. However, the interstitial opacity has increased in the interval. Mild opacities in the bases may be associated with small effusions. Mildly more focal opacity seen in the left mid lung as well. The heart size borderline. The hila and mediastinum are unremarkable. No nodules or masses. No focal infiltrates. IMPRESSION: 1. The findings are suggestive of pulmonary edema seen on the background of known interstitial lung disease. 2. Bibasilar opacities are likely associated with small effusions and may represent atelectasis versus infiltrate. 3. More focal opacity in the left mid lung could represent asymmetric edema versus underlying developing infiltrate. Electronically Signed   By: Dorise Bullion III M.D   On: 08/23/2017 07:04    ELIGIBLE FOR AVAILABLE RESEARCH PROTOCOL: no  ASSESSMENT: 84 y.o. [place] woman s/p right hemicolectomy 08/21/2017 for a T4b N0 MX adenocarcinoma, grade 2, additional studies pending--total 24 nodes removed  (a) chest x-ray 08/20/2017 shows no evidence of metastatic disease  (b) CT abdomen/pelvis 08/16/2017 shows no evidence of liver involvement  (c) CEA is informative  PLAN: We spent more than 50% of today's hour-long appointment in counseling and coordination of care regarding the biology of the Kari's diagnosis and the specifics of her situation.  Initially her thought was that she was very elderly and she understood chemotherapy was very harsh and she would not want  any further treatment.  Now that she had her surgery and seems to be recovering well she is a little bit more open to considering alternatives.  Accordingly we discussed the fact that there are more than 200 types of cancer, that each 1 is a separate and distinct disease, and that into each 1 is treated differently.  Because she had friends who had different cancers treated with chemotherapy and had a difficult time in the past, that does not mean that has to be her situation.  In fact many patients with colon cancer can be treated with the  pill, or with immunotherapy.  Many of these treatments can be very well-tolerated and in any case she can always quit any treatment that she starts if the symptoms become difficult for her.  The plan then would be for her to recover, get home, and then seek medical oncology evaluation.  To facilitate that a repeat CEA and repeat CT of the abdomen and pelvis would be helpful.  However the patient is strongly considering moving closer to her son [name], who lives in [place].  It would be very reasonable for her to do that and if so it would make most sense for her to seek medical oncology evaluation there.  Accordingly I left it that once they know what the plan is going to be, they would call me and I would either set him up to see [place] here in town or to be evaluated in [facility].  In the meantime I will request for pathology to send MSI and IHC testing in case the patient is a candidate for pembrolizumab, which would be a very favorable development if she decides to receive any treatment  Please let me know if I can be of further help at this point   [name] has a good understanding of the overall plan. She agrees with it. She knows the goal of treatment in her case is cure. She will call with any problems that may develop before her next visit here.  Chauncey Cruel, MD   08/25/2017 5:38 PM Medical Oncology and Hematology [facility] [address] [place], [place] [address] Tel. [phone]    Fax. [phone]

## 2017-08-25 NOTE — Progress Notes (Signed)
CSW contacted patient's son [name] [phone]) to discuss patient's SNF selection, phone was answered no one said anything then phone hung up. CSW called again same thing happened. Patient is alone in room and currently disoriented to situation.   CSW updated patient's RN. Patient's RN reported that patient's son requested to be contacted via text message due to poor cell phone reception. CSW texted patient's son, awaiting return correspondence. CSW needs SNF selection to ask SNF to start patient's insurance authorization. CSW will continue to follow and assist with discharge planning.  Abundio Miu, [place] Social Worker [facility] Cell#: [phone]

## 2017-08-25 NOTE — Progress Notes (Signed)
CSW received call from patient's son [place] Pincock) and informed that the SNF selection is [facility].   CSW updated [facility] that patient is no longer interested in their facility.  CSW contacted [facility], staff confirmed bed offer and agreed to start insurance authorization [facility] Medicare).  CSW will continue to follow and assist with discharge planning.  Abundio Miu, [place] Social Worker [facility] Cell#: [phone]

## 2017-08-25 NOTE — Progress Notes (Signed)
[facility] Progress Note  4 Days Post-Op  Subjective: CC-  No family at bedside. No complaints. Sitting up in bed eating breakfast. Reports mild right lower abdominal soreness, but overall pain well controlled. No n/v. Tolerating diet. Passing flatus, last BM day before yesterday.  Objective: Vital signs in last 24 hours: Temp:  [97.5 F (36.4 C)-98.5 F (36.9 C)] 98.5 F (36.9 C) (07/15 0513) Pulse Rate:  [72-83] 72 (07/15 0513) Resp:  [14-17] 14 (07/15 0513) BP: (134-166)/(73-79) 159/78 (07/15 0513) SpO2:  [88 %-94 %] 91 % (07/15 0513) Weight:  [121 lb 1.6 oz (54.9 kg)] 121 lb 1.6 oz (54.9 kg) (07/15 0513) Last BM Date: 08/23/17  Intake/Output from previous day: 07/14 0701 - 07/15 0700 In: 265 [P.O.:210; IV Piggyback:55] Out: 101 [Urine:101] Intake/Output this shift: Total I/O In: 240 [P.O.:240] Out: -   PE: Gen:  Alert, NAD, pleasant HEENT: EOM's intact, pupils equal and round Pulm: effort normal Abd: Soft, ND, appropriately tender, midline incision cdi with staples intact and honeycomb dressing in place, +BS Skin: no rashes noted, warm and dry  Lab Results:  Recent Labs    08/24/17 0520  WBC 9.2  HGB 9.0*  HCT 28.0*  PLT 227   BMET Recent Labs    08/24/17 0520  NA 141  K 4.4  CL 108  CO2 26  GLUCOSE 83  BUN 18  CREATININE 0.85  CALCIUM 7.9*   PT/INR No results for input(s): LABPROT, INR in the last 72 hours. CMP     Component Value Date/Time   NA 141 08/24/2017 0520   K 4.4 08/24/2017 0520   CL 108 08/24/2017 0520   CO2 26 08/24/2017 0520   GLUCOSE 83 08/24/2017 0520   GLUCOSE 118 (H) 01/13/2006 1010   BUN 18 08/24/2017 0520   CREATININE 0.85 08/24/2017 0520   CREATININE 0.90 (H) 08/13/2017 1546   CALCIUM 7.9 (L) 08/24/2017 0520   PROT 7.4 08/16/2017 1532   ALBUMIN 2.9 (L) 08/16/2017 1532   AST 25 08/16/2017 1532   ALT 17 08/16/2017 1532   ALKPHOS 41 08/16/2017 1532   BILITOT 0.4 08/16/2017 1532   GFRNONAA 56 (L)  08/24/2017 0520   GFRAA >60 08/24/2017 0520   Lipase     Component Value Date/Time   LIPASE 51 02/14/2017 1434       Studies/Results: No results found.  Anti-infectives: Anti-infectives (From admission, onward)   Start     Dose/Rate Route Frequency Ordered Stop   08/21/17 0600  cefoTEtan (CEFOTAN) 2 g in sodium chloride 0.9 % 100 mL IVPB     2 g 200 mL/hr over 30 Minutes Intravenous On call to O.R. 08/20/17 2208 08/21/17 1159       Assessment/Plan CAD/Hx of PAF/chronic diastolic dysfunction/LBBB - holding plavix due to anemia Hx of TIA/hx of syncope Hx of Carotid dz -Left CEA 11/2010 Type II diabetes Hypertension L2 compression fracture Code status DNR  Anemia secondary to bleeding from the colon mass - hg 9 (7/14), stable Ascending and distal descending colon masses - pathology from colonoscopy showed adenocarcinoma for the ascending site and benign adenoma for the descending site S/P lap assisted R hemicolectomy 08/21/17 Dr. Hassell Done - POD#4 - path pending - tolerating diet and having bowel function - pain well controlled  FEN: D1 diet VTE: SCDs ID: [email or web address] periop Foley: out Follow up: Dr. Hassell Done  Plan: Tolerating D1 diet, SLP eval pending and ok to advance as tolerated. Continue PT/mobilization. Honeycomb dressing can be removed tomorrow.  LOS: 9 days    Wellington Hampshire , [facility] Surgery 08/25/2017, 9:23 AM Pager: [phone] Consults: [phone] Mon 7:00 am -11:30 AM Tues-Fri 7:00 am-4:30 pm Sat-Sun 7:00 am-11:30 am

## 2017-08-25 NOTE — Evaluation (Signed)
Clinical/Bedside Swallow Evaluation Patient Details  Name: KAMIRAH SHUGRUE MRN: [id number] Date of Birth: 1920/10/30  Today's Date: 08/25/2017 Time: SLP Start Time (ACUTE ONLY): 1441 SLP Stop Time (ACUTE ONLY): 1451 SLP Time Calculation (min) (ACUTE ONLY): 10 min  Past Medical History:  Past Medical History:  Diagnosis Date  . ANEMIA, NORMOCYTIC 11/30/2009  . CAD (coronary artery disease) 04/27/2008   Non-Obstructive >> Cardiac catheterization 6/08: EF 55%, 1+ MR, Dx 50%, LAD 50%, dLAD 40%, OM1 50-60% (up to 70%), pOM 30%, pRCA 40%, mRCA 50-60%, pPDA 50-60% (up to 70%).  Medical therapy was recommended // MV 11/14: Normal stress nuclear study. LV Ejection Fraction: 67%.   . Carotid artery disease ([place])    Left CEA in 10/12.  Carotid dopplers (10/15) with 1-39% bilateral ICA stenosis. // Carotid US 10/17: R < 40; patent L CEA site  . COLONIC POLYPS, ADENOMATOUS 12/26/2009  . DIABETES MELLITUS, TYPE II 08/11/2006  . DIVERTICULOSIS, COLON 08/11/2006  . Fall 09/06/2016  . GERD 02/16/2007  . Hemorrhoids    s/p banding  . History of echocardiogram    Echo 5/18: EF 60-65, no RWMA, Gr 2 DD, mild-mod MR, mod LAE // Echo 2/17: Mild LVH, mild focal basal septal hypertrophy, EF 55-60%, no RWMA, Gr 1 DD, MAC, mild MR, mild  // Echo 4/14: EF 60-65%, normal wall motion, Gr 1 DD, MAC, mild MR, PASP 32, reduced excursion of AV noncoronary cusp.    Marland Kitchen HYPERLIPIDEMIA 09/09/2006  . HYPERTENSION 09/09/2006  . LEFT BUNDLE BRANCH BLOCK 08/11/2006  . Open fracture of nasal bones   . OSTEOARTHRITIS 08/11/2006  . OSTEOPOROSIS 08/11/2006  . PAF (paroxysmal atrial fibrillation) ([place]) 04/12/2009   Paroxysmal, only prior documented episode was years ago.  She was not started on anticoagulation back then due to hemorrhoidal bleeding, apparently profuse.  Event monitor (3/14) showed only NSR.    Marland Kitchen Stroke ([place]) 12-06-13  . Syncope    8/15. EEG (5/15) was unremarkable. // 4/18: ILR neg for arrhythmia - ? orthostatic (no  prodrome reported) >> c/b SAH (small)   . TIA 09/02/2006   Echocardiogram 6/12: Mild to moderate MR, trace AI, trace TR, PASP 34, bubble study negative for intracardiac shunting, normal EF (greater than 55%) //  Possible TIA in 10/15 with visual field cut.  MRI (10/15) with no CVA. Linq monitor placed.   Past Surgical History:  Past Surgical History:  Procedure Laterality Date  . BIOPSY  08/20/2017   Procedure: BIOPSY;  Surgeon: Mauri Pole, MD;  Location: [facility];  Service: Endoscopy;;  . CAROTID ENDARTERECTOMY Left 12-05-2010  . CLOSED REDUCTION METATARSAL FRACTURE     right 5th  . COLONOSCOPY W/ POLYPECTOMY  2001  . COLONOSCOPY WITH PROPOFOL N/A 08/20/2017   Procedure: COLONOSCOPY WITH PROPOFOL;  Surgeon: Mauri Pole, MD;  Location: [facility];  Service: Endoscopy;  Laterality: N/A;  . EYE SURGERY     catarac  . LAPAROSCOPIC PARTIAL COLECTOMY Right 08/21/2017   Procedure: LAPAROSCOPIC RIGHT COLECTOMY;  Surgeon: Johnathan Hausen, MD;  Location: [facility] ORS;  Service: General;  Laterality: Right;  . LOOP RECORDER IMPLANT N/A 01/03/2014   Procedure: LOOP RECORDER IMPLANT;  Surgeon: Evans Lance, MD;  Location: [facility] CATH LAB;  Service: Cardiovascular;  Laterality: N/A;  . SCHLEROTHERAPY  08/20/2017   Procedure: Woodward Ku;  Surgeon: Mauri Pole, MD;  Location: [facility];  Service: Endoscopy;;  spot tattoo  . TONSILLECTOMY     HPI:  84 y.o. female was admitted to [facility]  with anemia, constipation and then R hemicolectomy from previous R colon mass.PMHx: CAD, DM, PAF, LE weakness, TIA, CVA, GERD   Assessment / Plan / Recommendation Clinical Impression  Pt's oropharyngeal swallow appears function. She had intermittent baseline throat clearing, but no overt signs of aspiration with consistencies tested. Oral phase is swift. Recommend advancement up through regular textures as tolerated. Per MD orders, pt to advance up to soft solids. No further SLP f/u  indicated. SLP Visit Diagnosis: Dysphagia, unspecified (R13.10)    Aspiration Risk  No limitations    Diet Recommendation Regular;Thin liquid   Liquid Administration via: Cup;Straw Medication Administration: Whole meds with liquid Supervision: Patient able to self feed;Intermittent supervision to cue for compensatory strategies Compensations: Slow rate;Small sips/bites Postural Changes: Seated upright at 90 degrees    Other  Recommendations Oral Care Recommendations: Oral care BID   Follow up Recommendations None      Frequency and Duration            Prognosis Prognosis for Safe Diet Advancement: Good      Swallow Study   General HPI: 84 y.o. female was admitted to hosp with anemia, constipation and then R hemicolectomy from previous R colon mass.PMHx: CAD, DM, PAF, LE weakness, TIA, CVA, GERD Type of Study: Bedside Swallow Evaluation Previous Swallow Assessment: none in chart Diet Prior to this Study: Dysphagia 1 (puree);Thin liquids Temperature Spikes Noted: No Respiratory Status: Room air History of Recent Intubation: No Behavior/Cognition: Alert;Cooperative;Pleasant mood Oral Cavity Assessment: Within Functional Limits Oral Care Completed by SLP: No Oral Cavity - Dentition: Adequate natural dentition Vision: Functional for self-feeding Self-Feeding Abilities: Able to feed self Patient Positioning: Upright in chair Baseline Vocal Quality: Normal Volitional Cough: Strong Volitional Swallow: Able to elicit    Oral/Motor/Sensory Function Overall Oral Motor/Sensory Function: Within functional limits   Ice Chips Ice chips: Not tested   Thin Liquid Thin Liquid: Within functional limits Presentation: Cup;Self Fed;Straw    Nectar Thick Nectar Thick Liquid: Not tested   Honey Thick Honey Thick Liquid: Not tested   Puree Puree: Within functional limits Presentation: Self Fed;Spoon   Solid   GO   Solid: Within functional limits Presentation: Self Ennis Forts 08/25/2017,2:57 PM  Germain Osgood, M.A. CCC-SLP [phone]

## 2017-08-25 NOTE — Discharge Instructions (Signed)
[facility], [place] [phone]  OPEN ABDOMINAL SURGERY: POST OP INSTRUCTIONS  Always review your discharge instruction sheet given to you by the facility where your surgery was performed.  IF YOU HAVE DISABILITY OR FAMILY LEAVE FORMS, YOU MUST BRING THEM TO THE OFFICE FOR PROCESSING.  PLEASE DO NOT GIVE THEM TO YOUR DOCTOR.  1. A prescription for pain medication may be given to you upon discharge.  Take your pain medication as prescribed, if needed.  If narcotic pain medicine is not needed, then you may take acetaminophen (Tylenol) or ibuprofen (Advil) as needed. 2. Take your usually prescribed medications unless otherwise directed. 3. If you need a refill on your pain medication, please contact your pharmacy. They will contact our office to request authorization.  Prescriptions will not be filled after 5pm or on week-ends. 4. You should follow a light diet the first few days after arrival home, such as soup and crackers, pudding, etc.unless your doctor has advised otherwise. A high-fiber, low fat diet can be resumed as tolerated.   Be sure to include lots of fluids daily. Most patients will experience some swelling and bruising on the chest and neck area.  Ice packs will help.  Swelling and bruising can take several days to resolve 5. Most patients will experience some swelling and bruising in the area of the incision. Ice pack will help. Swelling and bruising can take several days to resolve..  6. It is common to experience some constipation if taking pain medication after surgery.  Increasing fluid intake and taking a stool softener will usually help or prevent this problem from occurring.  A mild laxative (Milk of Magnesia or Miralax) should be taken according to package directions if there are no bowel movements after 48 hours. 7.  The honeycomb dressing can be removed on 08/26/17. Staples will be removed at the office during your follow-up visit. You may find that a light  gauze bandage over your incision may keep your staples from being rubbed or pulled. You may shower and replace the bandage daily. 8. ACTIVITIES:  You may resume regular (light) daily activities beginning the next day--such as daily self-care, walking, climbing stairs--gradually increasing activities as tolerated.  You may have sexual intercourse when it is comfortable.  Refrain from any heavy lifting or straining until approved by your doctor. a. You may drive when you no longer are taking prescription pain medication, you can comfortably wear a seatbelt, and you can safely maneuver your car and apply brakes 9. You should see your doctor in the office for a follow-up appointment approximately two weeks after your surgery.  Make sure that you call for this appointment within a day or two after you arrive home to insure a convenient appointment time. OTHER INSTRUCTIONS:  _____________________________________________________________ _____________________________________________________________  WHEN TO CALL YOUR DOCTOR: 1. Fever over 101.0 2. Inability to urinate 3. Nausea and/or vomiting 4. Extreme swelling or bruising 5. Continued bleeding from incision. 6. Increased pain, redness, or drainage from the incision. 7. Difficulty swallowing or breathing 8. Muscle cramping or spasms. 9. Numbness or tingling in hands or feet or around lips.  The clinic staff is available to answer your questions during regular business hours.  Please dont hesitate to call and ask to speak to one of the nurses if you have concerns.  For further questions, please visit [email or web address]

## 2017-08-25 NOTE — Progress Notes (Signed)
Physical Therapy Treatment Patient Details Name: [name] MRN: [id number] DOB: May 26, 1920 Today's Date: 08/25/2017    History of Present Illness 84 y.o. female was admitted to hosp with anemia, constipation and then R hemicolectomy from previous R colon mass.  PMHx:  CAD, DM, PAF, LE weakness    PT Comments    Pt OOB in recliner.  Assisted with amb in hallway.  Did require a rolling walker for gait instability.    Follow Up Recommendations  SNF     Equipment Recommendations  None recommended by PT    Recommendations for Other Services       Precautions / Restrictions Precautions Precaution Comments: recent ABD surgery Restrictions Weight Bearing Restrictions: No    Mobility  Bed Mobility               General bed mobility comments: OOB in recliner  Transfers Overall transfer level: Needs assistance Equipment used: Rolling walker (2 wheeled) Transfers: Sit to/from Stand Sit to Stand: Min assist         General transfer comment: min assist to rise from commode using bar.   Ambulation/Gait Ambulation/Gait assistance: Min guard Gait Distance (Feet): 75 Feet Assistive device: Rolling walker (2 wheeled) Gait Pattern/deviations: Step-through pattern;Decreased stride length;Trunk flexed Gait velocity: decreased   General Gait Details:  wide based gait with pt requiring directional assistance, prompts tostand up straight on the walker   Stairs             Wheelchair Mobility    Modified Rankin (Stroke Patients Only)       Balance                                            Cognition Arousal/Alertness: Awake/alert Behavior During Therapy: WFL for tasks assessed/performed Overall Cognitive Status: Within Functional Limits for tasks assessed                                        Exercises      General Comments        Pertinent Vitals/Pain Pain Assessment: Faces Faces Pain Scale: Hurts little  more Pain Location: ABD R side Pain Descriptors / Indicators: Grimacing Pain Intervention(s): Monitored during session    Home Living                      Prior Function            PT Goals (current goals can now be found in the care plan section) Progress towards PT goals: Progressing toward goals    Frequency    Min 2X/week      PT Plan Frequency needs to be updated    Co-evaluation              AM-PAC PT "6 Clicks" Daily Activity  Outcome Measure  Difficulty turning over in bed (including adjusting bedclothes, sheets and blankets)?: A Little Difficulty moving from lying on back to sitting on the side of the bed? : A Little Difficulty sitting down on and standing up from a chair with arms (e.g., wheelchair, bedside commode, etc,.)?: Unable Help needed moving to and from a bed to chair (including a wheelchair)?: Total Help needed walking in hospital room?: Total Help needed climbing 3-5 steps with a railing? :  A Little 6 Click Score: 12    End of Session Equipment Utilized During Treatment: Gait belt Activity Tolerance: Patient tolerated treatment well Patient left: in chair;with call bell/phone within reach Nurse Communication: Mobility status PT Visit Diagnosis: Unsteadiness on feet (R26.81);Repeated falls (R29.6);Muscle weakness (generalized) (M62.81)     Time: 9163-8466 PT Time Calculation (min) (ACUTE ONLY): 13 min  Charges:  $Gait Training: 8-22 mins                    G Codes:       Rica Koyanagi  PTA [facility]  Acute  Rehab Pager      [phone]

## 2017-08-25 NOTE — Progress Notes (Signed)
CSW received return call from patient's son Taeja Debellis [phone]). Patient's son reported that he planned to visit [facility] and [facility] this afternoon. CSW explained that patient needs to make SNF selection to start insurance authorization. Patient's son reported that he will not be available until this afternoon because he is currently at the hospital with his wife. Patient's son reported that this is the first that he is hearing of any bed offers. CSW inquired about previous CSW that provided bed offers, patient's son reported that those offers may have been provided to his brother and that he had not received those offers from his brother. Patient's son requested bed offers via email, CSW agreed to send. Patient's son reported that [facility] can start the insurance authorization process and that he will try to visit this afternoon.   CSW will email SNF offers to patient's son.  CSW will update [facility] SN[facility] and request they start insurance authorization.  CSW will continue to follow and assist with discharge planning.   Abundio Miu, [place] Social Worker [facility] Cell#: [phone]

## 2017-08-25 NOTE — Progress Notes (Signed)
CSW followed up with [facility] to inquire about patient's insurance authorization, staff reported that patient's insurance authorization is still pending.   CSW contacted by patient's son and informed that he is currently visiting SNFs. Patient's son to update CSW after he finishes visiting SNFs.  CSW will continue to follow and assist with discharge planning.  [name], [place] Social Worker [facility] Cell#: [phone]

## 2017-08-25 NOTE — Progress Notes (Signed)
PROGRESS NOTE    [name]  SLH:[id number] DOB: 1921/01/07 DOA: 08/16/2017 PCP: Eulas Post, MD   Brief Narrative: [name] is a 84 y.o. femalewith medical history significant ofcoronary artery disease,paroxysmal A. fib ,diet-controlled type 2 diabetes. She presented with fatigue and found to have symptomatic anemia secondary to colonic mass (confirmed adenocarcinoma) +/- diverticulosis. S/p right colectomy. Advancing diet slowly.   Assessment & Plan:   Principal Problem:   Cancer of ascending colon with bleeding s/p right proximal colectomy 08/21/2017 Active Problems:   Type 2 diabetes mellitus, controlled (HCC)   Hyperlipidemia   Symptomatic anemia   Essential hypertension   CAD (coronary artery disease)   PAF (paroxysmal atrial fibrillation) (HCC)   Carotid artery disease (HCC)   ILD (interstitial lung disease) ([place])   Chronic diastolic (congestive) heart failure (HCC)   Blood loss anemia   Fatigue associated with anemia   History of TIA (transient ischemic attack)   Gastrointestinal hemorrhage   Protein-calorie malnutrition, severe   Colonic obstruction by tumor s/p colectomy 08/21/2017   Acute blood loss anemia In setting of colonic mass. Concern for malignancy. Patient is s/p 1 units of PRBC. Colonoscopy significant for mass and diverticulosis, but no active bleeding. Hemoglobin stable.  Adenocarcinoma of the colon Confirmed on biopsy. S/p right colectomy on 7/11. Discussed case with medical oncology, and no role for medical therapy. Recommend palliative care. Discussed with patient. -General surgery recommendations: advanced diet -Palliative care recommendations -Oncology re-consulted and will discuss with patient/family  Type 2 diabetes, diet controlled Diet controlled -Continue SSI  Hyperlipidemia -Continue Lipitor  CAD On plavix and lipitor as an outpatient. Plavix on hold secondary to GI bleeding -Continue Lipitor  History of  TIA -Continue Lipitor  Chronic diastolic heart dysfunction Euvolemic.  Paroxysmal atrial fibrillation Rate controlled. On metoprolol as an outpateint. Not on anticoagulation -Continue metoprolol  Delirium Improved since being out of the ICU.   DVT prophylaxis: SCDs Code Status:   Code Status: DNR Family Communication: None at bedside Disposition Plan: Discharge to SNF pending general surgery/goals of care   Consultants:   Gastroenterology  General surgery  Palliative care  Oncology  Procedures:   7/6: 1 unit PRBC  7/9: Colonoscopy  7/11: Right colectomy  Antimicrobials:  None    Subjective: No issues today. Bowel movements. Abdominal pain is manageable.  Objective: Vitals:   08/24/17 1545 08/24/17 2057 08/25/17 0513 08/25/17 1414  BP: 134/79 (!) 166/73 (!) 159/78 (!) 177/77  Pulse: 83 75 72 69  Resp: 17 15 14 14   Temp: 97.8 F (36.6 C) (!) 97.5 F (36.4 C) 98.5 F (36.9 C) 98 F (36.7 C)  TempSrc: Oral Oral Oral Oral  SpO2: (!) 88% 94% 91% 96%  Weight:   54.9 kg (121 lb 1.6 oz)   Height:        Intake/Output Summary (Last 24 hours) at 08/25/2017 1450 Last data filed at 08/25/2017 1149 Gross per 24 hour  Intake 420 ml  Output 1 ml  Net 419 ml   Filed Weights   08/23/17 0500 08/24/17 0500 08/25/17 0513  Weight: 55.3 kg (121 lb 14.6 oz) 54 kg (119 lb 0.8 oz) 54.9 kg (121 lb 1.6 oz)    Examination:  General exam: Appears calm and comfortable Respiratory system: Clear to auscultation. Respiratory effort normal. Cardiovascular system: S1 & S2 heard, RRR. No murmurs. Gastrointestinal system: Abdomen is nondistended, soft and nontender. Normal bowel sounds heard. Staples intact. Central nervous system: Alert and oriented. No focal  neurological deficits. Extremities: No edema. No calf tenderness Skin: No cyanosis. No rashes Psychiatry: Judgement and insight appear normal. Mood & affect appropriate.   Data Reviewed: I have personally reviewed  following labs and imaging studies  CBC: Recent Labs  Lab 08/20/17 0523 08/20/17 1209 08/21/17 0533 08/22/17 0725 08/24/17 0520  WBC 8.7  --  9.1 11.5* 9.2  HGB 8.0* 8.9* 8.1* 9.6* 9.0*  HCT 25.4* 28.7* 25.8* 29.5* 28.0*  MCV 89.8  --  89.6 86.8 89.5  PLT 268  --  259 230 962   Basic Metabolic Panel: Recent Labs  Lab 08/20/17 0523 08/21/17 0533 08/22/17 0725 08/24/17 0520  NA 141 137 136 141  K 3.8 4.3 4.6 4.4  CL 108 105 105 108  CO2 24 25 23 26   GLUCOSE 122* 124* 180* 83  BUN 22 15 20 18   CREATININE 0.85 0.77 0.92 0.85  CALCIUM 8.2* 8.2* 7.7* 7.9*   GFR: Estimated Creatinine Clearance: 34.3 mL/min (by C-G formula based on SCr of 0.85 mg/dL). Liver Function Tests: No results for input(s): AST, ALT, ALKPHOS, BILITOT, PROT, ALBUMIN in the last 168 hours. No results for input(s): LIPASE, AMYLASE in the last 168 hours. No results for input(s): AMMONIA in the last 168 hours. Coagulation Profile: Recent Labs  Lab 08/21/17 0533  INR 1.15   Cardiac Enzymes: No results for input(s): CKTOTAL, CKMB, CKMBINDEX, TROPONINI in the last 168 hours. BNP (last 3 results) No results for input(s): PROBNP in the last 8760 hours. HbA1C: No results for input(s): HGBA1C in the last 72 hours. CBG: Recent Labs  Lab 08/24/17 1149 08/24/17 1651 08/24/17 2055 08/25/17 0736 08/25/17 1217  GLUCAP 167* 158* 137* 129* 154*   Lipid Profile: No results for input(s): CHOL, HDL, LDLCALC, TRIG, CHOLHDL, LDLDIRECT in the last 72 hours. Thyroid Function Tests: No results for input(s): TSH, T4TOTAL, FREET4, T3FREE, THYROIDAB in the last 72 hours. Anemia Panel: No results for input(s): VITAMINB12, FOLATE, FERRITIN, TIBC, IRON, RETICCTPCT in the last 72 hours. Sepsis Labs: No results for input(s): PROCALCITON, LATICACIDVEN in the last 168 hours.  Recent Results (from the past 240 hour(s))  Surgical pcr screen     Status: None   Collection Time: 08/21/17  5:16 AM  Result Value Ref Range  Status   MRSA, PCR NEGATIVE NEGATIVE Final   Staphylococcus aureus NEGATIVE NEGATIVE Final    Comment: (NOTE) The Xpert SA Assay (FDA approved for NASAL specimens in patients 18 years of age and older), is one component of a comprehensive surveillance program. It is not intended to diagnose infection nor to guide or monitor treatment. Performed at [facility], [place] [address] [place], [place] [address]          Radiology Studies: No results found.      Scheduled Meds: . acetaminophen  1,000 mg Oral TID  . aspirin EC  81 mg Oral Daily  . gabapentin  100 mg Oral QHS  . insulin aspart  0-5 Units Subcutaneous QHS  . insulin aspart  0-9 Units Subcutaneous TID WC  . lip balm  1 application Topical BID  . mouth rinse  15 mL Mouth Rinse BID  . metoprolol tartrate  25 mg Oral BID  . pantoprazole  20 mg Oral Daily  . psyllium  1 packet Oral Daily  . saccharomyces boulardii  250 mg Oral BID  . sodium chloride flush  3 mL Intravenous Q12H  . vitamin C  500 mg Oral Daily   Continuous Infusions: .  sodium chloride    . lactated ringers    . ondansetron (ZOFRAN) IV       LOS: 9 days     Cordelia Poche, MD [facility] 08/25/2017, 2:50 PM Pager: [phone]  If 7PM-7AM, please contact night-coverage [email or web address] 08/25/2017, 2:50 PM

## 2017-08-26 DIAGNOSIS — I10 Essential (primary) hypertension: Secondary | ICD-10-CM

## 2017-08-26 LAB — CBC
HCT: 31.3 % — ABNORMAL LOW (ref 36.0–46.0)
HEMOGLOBIN: 9.8 g/dL — AB (ref 12.0–15.0)
MCH: 28.2 pg (ref 26.0–34.0)
MCHC: 31.3 g/dL (ref 30.0–36.0)
MCV: 89.9 fL (ref 78.0–100.0)
Platelets: 264 10*3/uL (ref 150–400)
RBC: 3.48 MIL/uL — AB (ref 3.87–5.11)
RDW: 15.4 % (ref 11.5–15.5)
WBC: 7.9 10*3/uL (ref 4.0–10.5)

## 2017-08-26 LAB — BASIC METABOLIC PANEL
ANION GAP: 7 (ref 5–15)
BUN: 16 mg/dL (ref 8–23)
CO2: 28 mmol/L (ref 22–32)
Calcium: 8.2 mg/dL — ABNORMAL LOW (ref 8.9–10.3)
Chloride: 105 mmol/L (ref 98–111)
Creatinine, Ser: 0.83 mg/dL (ref 0.44–1.00)
GFR calc Af Amer: >60 mL/min (ref >60)
GFR calc non Af Amer: 58 mL/min — ABNORMAL LOW (ref >60)
Glucose, Bld: 118 mg/dL — ABNORMAL HIGH (ref 70–99)
POTASSIUM: 4.2 mmol/L (ref 3.5–5.1)
SODIUM: 140 mmol/L (ref 135–145)

## 2017-08-26 LAB — GLUCOSE, CAPILLARY
GLUCOSE-CAPILLARY: 122 mg/dL — AB (ref 70–99)
Glucose-Capillary: 127 mg/dL — ABNORMAL HIGH (ref 70–99)

## 2017-08-26 MED ORDER — ACETAMINOPHEN 325 MG PO TABS: 650.0000 mg | ORAL_TABLET | Freq: Four times a day (QID) | ORAL | Status: DC | PRN

## 2017-08-26 NOTE — Progress Notes (Signed)
ADDENDUM: Advised by radiology that breast biopsy will need to be done as outpatient. If patient is referred to Dr Rogue Bussing at discharge he can arrange for this at his discretion.  GM

## 2017-08-26 NOTE — Clinical Social Work Placement (Addendum)
Physician please sign patient DNR, on chart.   CLINICAL SOCIAL WORK PLACEMENT  NOTE  Date:  08/26/2017  Patient Details  Name: [name] MRN: [id number] Date of Birth: 1920/04/11  Clinical Social Work is seeking post-discharge placement for this patient at the [place] level of care (*CSW will initial, date and re-position this form in  chart as items are completed):  Yes   Patient/family provided with [place] Work Department's list of facilities offering this level of care within the geographic area requested by the patient (or if unable, by the patient's family).  Yes   Patient/family informed of their freedom to choose among providers that offer the needed level of care, that participate in Medicare, Medicaid or managed care program needed by the patient, have an available bed and are willing to accept the patient.  Yes   Patient/family informed of [place]'s ownership interest in [facility] and [facility], as well as of the fact that they are under no obligation to receive care at these facilities.  PASRR submitted to EDS on 08/22/17     PASRR number received on 08/22/17     Existing PASRR number confirmed on       FL2 transmitted to all facilities in geographic area requested by pt/family on       FL2 transmitted to all facilities within larger geographic area on 08/26/17     Patient informed that his/her managed care company has contracts with or will negotiate with certain facilities, including the following:        Yes   Patient/family informed of bed offers received.  Patient chooses bed at [place], [place]     Physician recommends and patient chooses bed at      Patient to be transferred to [place], [place] on 08/26/17.  Patient to be transferred to facility by [facility] will transport.   Patient family notified on 08/26/17 of transfer.  Name of family member notified:     Son =[name]    PHYSICIAN Please prepare priority discharge summary, including medications     Additional Comment:    _______________________________________________ Lia Hopping, LCSW 08/26/2017, 10:38 AM

## 2017-08-26 NOTE — Care Management Important Message (Signed)
Important Message  Patient Details  Name: [name] MRN: [id number] Date of Birth: [birth year]   Medicare Important Message Given:  Yes    [name] 08/26/2017, 11:13 AMImportant Message  Patient Details  Name: [name] MRN: [id number] Date of Birth: 26-Mar-1920   Medicare Important Message Given:  Yes    [name] 08/26/2017, 11:12 AM

## 2017-08-26 NOTE — Progress Notes (Addendum)
[facility] Progress Note  5 Days Post-Op  Subjective: CC-  Sitting up in bed eating an omlette. Abdomen sore but not painful. Denies n/v. BM this AM. Hoping to go to SNF today.  Objective: Vital signs in last 24 hours: Temp:  [98 F (36.7 C)-99 F (37.2 C)] 99 F (37.2 C) (07/16 0622) Pulse Rate:  [69-77] 73 (07/16 0622) Resp:  [14-16] 16 (07/15 2044) BP: (169-177)/(67-77) 169/72 (07/16 0622) SpO2:  [92 %-96 %] 92 % (07/16 0622) Last BM Date: 08/23/17  Intake/Output from previous day: 07/15 0701 - 07/16 0700 In: 1140 [P.O.:1140] Out: 0  Intake/Output this shift: No intake/output data recorded.  PE: Gen:  Alert, NAD, pleasant HEENT: EOM's intact, pupils equal and round Pulm: effort normal Abd: Soft, ND, mild RLQ tenderness, midline incision cdi with staples intact and no erythema or drainage, +BS Skin: no rashes noted, warm and dry  Lab Results:  Recent Labs    08/24/17 0520 08/26/17 0514  WBC 9.2 7.9  HGB 9.0* 9.8*  HCT 28.0* 31.3*  PLT 227 264   BMET Recent Labs    08/24/17 0520 08/26/17 0514  NA 141 140  K 4.4 4.2  CL 108 105  CO2 26 28  GLUCOSE 83 118*  BUN 18 16  CREATININE 0.85 0.83  CALCIUM 7.9* 8.2*   PT/INR No results for input(s): LABPROT, INR in the last 72 hours. CMP     Component Value Date/Time   NA 140 08/26/2017 0514   K 4.2 08/26/2017 0514   CL 105 08/26/2017 0514   CO2 28 08/26/2017 0514   GLUCOSE 118 (H) 08/26/2017 0514   GLUCOSE 118 (H) 01/13/2006 1010   BUN 16 08/26/2017 0514   CREATININE 0.83 08/26/2017 0514   CREATININE 0.90 (H) 08/13/2017 1546   CALCIUM 8.2 (L) 08/26/2017 0514   PROT 7.4 08/16/2017 1532   ALBUMIN 2.9 (L) 08/16/2017 1532   AST 25 08/16/2017 1532   ALT 17 08/16/2017 1532   ALKPHOS 41 08/16/2017 1532   BILITOT 0.4 08/16/2017 1532   GFRNONAA 58 (L) 08/26/2017 0514   GFRAA >60 08/26/2017 0514   Lipase     Component Value Date/Time   LIPASE 51 02/14/2017 1434        Studies/Results: No results found.  Anti-infectives: Anti-infectives (From admission, onward)   Start     Dose/Rate Route Frequency Ordered Stop   08/21/17 0600  cefoTEtan (CEFOTAN) 2 g in sodium chloride 0.9 % 100 mL IVPB     2 g 200 mL/hr over 30 Minutes Intravenous On call to O.R. 08/20/17 2208 08/21/17 1159       Assessment/Plan CAD/Hx of PAF/chronic diastolic dysfunction/LBBB - holding plavix due to anemia Hx of TIA/hx of syncope Hx of Carotid dz -Left CEA 11/2010 Type II diabetes Hypertension L2 compression fracture Code status DNR  Anemia secondary to bleeding from the colon mass- hg 9.8, stable Ascending and distal descending colon masses- pathology from colonoscopy showed adenocarcinoma for the ascending site and benign adenoma for the descending site S/P lap assisted R hemicolectomy 08/21/17 Dr. Hassell Done - POD#5 - path: INVASIVE ADENOCARCINOMA, MODERATELY DIFFERENTIATED, SPANNING 4.6 CM. ADDITIONAL SMALL FOCUS OF ADENOCARCINOMA ARISING IN A TUBULAR ADENOMA. TUMOR INVADES THROUGH SEROSA INTO ADHERENT OMENTUM. RESECTION MARGINS ARE NEGATIVE. 24/24 LYMPH NODES NEGATIVE - appreciate Dr. Jana Hakim consult - tolerating diet and having bowel function - pain well controlled  FEN: soft diet VTE: SCDs, ok for chemical DVT prophylaxis from surgical standpoint ID: [email or web address] periop Foley: out Follow  up: Dr. Hassell Done  Plan: Patient stable for discharge from surgical standpoint. Staples to be removed next week. F/u appointments and discharge instructions on AVS.   LOS: 10 days    Wellington Hampshire , [facility] Surgery 08/26/2017, 8:29 AM Pager: [phone] Consults: [phone] Mon 7:00 am -11:30 AM Tues-Fri 7:00 am-4:30 pm Sat-Sun 7:00 am-11:30 am

## 2017-08-26 NOTE — Discharge Summary (Signed)
Physician Discharge Summary  CORVETTE ORSER GLO:[id number] DOB: 24-Aug-1920 DOA: 08/16/2017  PCP: Eulas Post, MD  Admit date: 08/16/2017 Discharge date: 08/26/2017  Admitted From: Home Disposition: SNF  Recommendations for Outpatient Follow-up:  1. Follow up with PCP in 1 week 2. Follow up with general surgery/medical oncology 3. Please obtain BMP/CBC in one week 4. Recommend palliative care consult 5. Please follow up on the following pending results: None  Home Health: SNF Equipment/Devices: None  Discharge Condition: Stable CODE STATUS: DNR Diet recommendation: Soft diet   Brief/Interim Summary:  Admission HPI written by Shelbie Proctor, MD   Chief Complaint: Fatigue, weakness  HPI: [name] is a 84 y.o. female with medical history significant of coronary artery disease, paroxysmal A. fib ,diet-controlled type 2 diabetes presents with severe fatigue and weakness over the past several months.  Her symptoms  have become markedly more noticeable over the past few weeks.  She has been to see her PCP numerous times for this.  She has recently noticed that she was unable to even do her ADLs without significant weakness and having to stop numerous times.  Patient lives in the area alone.  She only uses a cane as needed.  was very high functioning until  the past few months when her weakness has limited her.  She saw her PCP this past week she was found to have a hemoglobin around 7.22.  That was down from about 10 in April of this year.  Admitted  to dark black stool once before but not again. She has had Intermittent right lower quadrant abdominal pain.  Denies any nausea vomiting.  She is lost 20 pounds over the past several months.  She states in January for about 3 months she has severe diarrhea.  She did see a GI doctor in the area but she is unsure of the groups name.  Patient denies any chest pain but has had dyspnea on exertion.  Does take Plavix and aspirin.  She  does have a remote history of TIA.  Patient was found to be anemic here. She was heme Positive.  Also found to have a large colonic mass on abdominal CT.  sHe has received 1 unit of packed red blood cells in the ED.  ED called GI Dr. Ardis Hughs who will see patient in the morning for consultation.   ED Course: CT of the abdomen and pelvis with contrast showed a large annular colonic mass involving the entire descending colon highly suspicious for primary colonic malignancy.  Was also central mesenteric and porta hepatis lymphadenopathy suspicious for nodal metastatic disease.  Mild intrahepatic and extrahepatic biliary ductal dilatation.  There was also severe air to vertebral compression fracture of unknown chronicity. -Patient was given 1 unit of packed red blood cells. -GI Dr. Ardis Hughs was called  Review of Systems: As per HPI otherwise 10 point review of systems negative.  Positive for fatigue ,dyspnea on exertion ,generalized weakness,20 pound weight loss , mild right lower quadrant abdominal pain denies headache chest pain cough fever, night sweats, back pain All others reviewed and otherwise negative    Hospital course:  Acute blood loss anemia In setting of colonic mass. Concern for malignancy. Patient is s/p 1 units of PRBC. Colonoscopy significant for mass and diverticulosis, but no active bleeding. Hemoglobin stable.  Adenocarcinoma of the colon Confirmed on biopsy. S/p right hemicolectomy on 7/11 for palliative reasons. Discussed case with medical oncology. Outpatient follow-up.  Type 2 diabetes, diet controlled  Diet controlled. Sliding scale while inpatient.  Hyperlipidemia Continued Lipitor  CAD On plavix and lipitor as an outpatient. Plavix on hold secondary to GI bleeding. Lipitor  History of TIA Continued Lipitor  Chronic diastolic heart dysfunction Euvolemic.  Paroxysmal atrial fibrillation Rate controlled. On metoprolol as an outpateint. Not on  anticoagulation. Continued metoprolol  Delirium Resolved since transfer out of the ICU.     Discharge Diagnoses:  Principal Problem:   Cancer of ascending colon with bleeding s/p right proximal colectomy 08/21/2017 Active Problems:   Type 2 diabetes mellitus, controlled (HCC)   Hyperlipidemia   Symptomatic anemia   Essential hypertension   CAD (coronary artery disease)   PAF (paroxysmal atrial fibrillation) (HCC)   Carotid artery disease (HCC)   ILD (interstitial lung disease) (HCC)   Chronic diastolic (congestive) heart failure (HCC)   Blood loss anemia   Fatigue associated with anemia   History of TIA (transient ischemic attack)   Gastrointestinal hemorrhage   Protein-calorie malnutrition, severe   Colonic obstruction by tumor s/p colectomy 08/21/2017    Discharge Instructions  Discharge Instructions    Call MD for:  redness, tenderness, or signs of infection (pain, swelling, redness, odor or green/yellow discharge around incision site)   Complete by:  As directed    Call MD for:  severe uncontrolled pain   Complete by:  As directed    Call MD for:  temperature >100.4   Complete by:  As directed    Increase activity slowly   Complete by:  As directed      Allergies as of 08/26/2017      Reactions   Sulfamethoxazole-trimethoprim    unknown   Sulfonamide Derivatives    REACTION: rash   Nitrofurantoin    Unknown. Macrobid.       Medication List    TAKE these medications   acetaminophen 325 MG tablet Commonly known as:  TYLENOL Take 2 tablets (650 mg total) by mouth every 6 (six) hours as needed for mild pain. What changed:    when to take this  reasons to take this   aspirin EC 81 MG tablet Take 1 tablet (81 mg total) by mouth daily.   atorvastatin 20 MG tablet Commonly known as:  LIPITOR TAKE 1 TABLET DAILY AT 6 P.M.   calcium carbonate 200 MG capsule Take 200 mg by mouth daily.   docusate sodium 100 MG capsule Commonly known as:  COLACE Take  1 capsule (100 mg total) by mouth 2 (two) times daily. What changed:    when to take this  reasons to take this   fish oil-omega-3 fatty acids 1000 MG capsule Take 1 capsule by mouth daily.   gabapentin 100 MG capsule Commonly known as:  NEURONTIN Take 1 capsule (100 mg total) by mouth at bedtime. Toe pain   glucose blood test strip Use once daily   metoprolol tartrate 25 MG tablet Commonly known as:  LOPRESSOR TAKE ONE-HALF (1/2) TABLET TWICE A DAY   mirtazapine 7.5 MG tablet Commonly known as:  REMERON Take 1 tablet (7.5 mg total) by mouth at bedtime.   multivitamin with minerals tablet Take 1 tablet by mouth daily.   ONE TOUCH LANCETS Misc Use daily as directed   pantoprazole 20 MG tablet Commonly known as:  PROTONIX TAKE 1 TABLET EVERY OTHER DAY What changed:    how much to take  how to take this  when to take this   polyethylene glycol packet Commonly known as:  MIRALAX /  GLYCOLAX Take 17 g by mouth daily as needed for moderate constipation.   VITAMIN B 12 PO Take 1 tablet by mouth daily.   VITAMIN C PO Take 1 tablet by mouth daily.   VITAMIN D PO Take 1 tablet by mouth daily.   VITAMIN E PO Take 1 tablet by mouth daily.       Contact information for follow-up providers    Johnathan Hausen, MD. Call.   Specialty:  General Surgery Why:  We are working on your appointment, please call to confirm. Please arrive 15 minutes early to check in. Contact information: [address] [place] [address] [phone]        [facility], [place]. Go on 09/04/2017.   Specialty:  General Surgery Why:  Your appointment is 7/25 at [facility] to have your staples removed by one of our nurses.  Please arrive 30 minutes prior to your appointment to check in and fill out paperwork. Bring photo ID and insurance information. Contact information: [address] [place] [phone]       Magrinat,  Virgie Dad, MD. Schedule an appointment as soon as possible for a visit.   Specialty:  Oncology Why:  Cancer follow-up Contact information: [place] [address] [phone]            Contact information for after-discharge care    Destination    [facility] Preferred SNF .   Service:  Skilled Nursing Contact information: [place] [phone]                 Allergies  Allergen Reactions  . Sulfamethoxazole-Trimethoprim     unknown  . Sulfonamide Derivatives     REACTION: rash  . Nitrofurantoin     Unknown. Macrobid.     Consultations:  Gastroenterology  General surgery  Medical oncology   Procedures/Studies: Dg Chest 2 View  Result Date: 08/20/2017 CLINICAL DATA:  Preoperative evaluation for colon surgery EXAM: CHEST - 2 VIEW COMPARISON:  09/06/2016 FINDINGS: Loop recorder projects over lower medial LEFT chest. Normal heart size, mediastinal contours, and pulmonary vascularity. Hyperinflation and bronchitic changes question COPD. Biapical scarring. Diffuse interstitial prominence throughout both lungs similar to previous exam favoring pulmonary fibrosis. No definite acute infiltrate, pleural effusion or pneumothorax. Bones demineralized. IMPRESSION: Probable pulmonary fibrosis and question underlying COPD. No acute abnormalities. Electronically Signed   By: Lavonia Dana M.D.   On: 08/20/2017 16:11   Ct Abdomen Pelvis W Contrast  Result Date: 08/16/2017 CLINICAL DATA:  Anemia. GI bleed. Heme-positive stools. Intermittent right lower quadrant pain. EXAM: CT ABDOMEN AND PELVIS WITH CONTRAST TECHNIQUE: Multidetector CT imaging of the abdomen and pelvis was performed using the standard protocol following bolus administration of intravenous contrast. CONTRAST:  139mL ISOVUE-300 IOPAMIDOL (ISOVUE-300) INJECTION 61% COMPARISON:  09/05/2016 CT pelvis. FINDINGS: Lower chest:  Nonspecific patchy subpleural reticulation and ground-glass attenuation at both lung bases, not definitely changed since 03/16/2015. Coronary atherosclerosis. Hepatobiliary: Normal liver size. No liver mass. Mild diffuse intrahepatic biliary ductal dilatation. Dilated common bile duct (10 mm diameter). Faintly calcified gallstones in the nondistended gallbladder with no definite gallbladder wall thickening or pericholecystic fluid. Pancreas: Normal, with no mass or duct dilation. Spleen: Normal size. No mass. Adrenals/Urinary Tract: No discrete adrenal nodules. Normal kidneys with no hydronephrosis and no renal mass. Normal bladder. Stomach/Bowel: Normal non-distended stomach. Normal caliber small bowel with no small bowel wall thickening. Normal  appendix. There is a large annular colonic mass involving the entire ascending colon measuring 4.7 x 3.5 x 7.5 cm (series 2/image 36). There is associated haziness of the pericolonic fat in the ascending colon. Marked sigmoid diverticulosis, with no sigmoid wall thickening or pericolonic fat stranding. Vascular/Lymphatic: Atherosclerotic nonaneurysmal abdominal aorta. Patent portal, splenic, hepatic and renal veins. A few mildly prominent rounded nodes in lateral right ascending colonic mesentery measuring up to 7 mm (series 2/image 32). Conglomerate central mesenteric adenopathy up to 2.1 cm (series 2/image 37). Enlarged 1.7 cm porta hepatis node (series 2/image 19). No pelvic adenopathy. Reproductive: Grossly normal uterus.  No adnexal mass. Other: No pneumoperitoneum, ascites or focal fluid collection. Musculoskeletal: No aggressive appearing focal osseous lesions. Severe T2 vertebral compression fracture of uncertain chronicity with 8 mm bony retropulsion into the anterior lumbar spinal canal. Moderate lumbar spondylosis. IMPRESSION: 1. Large annular colonic mass involving the entire ascending colon, highly suspicious for primary colonic malignancy. 2. Nonspecific  pericolonic fat haziness associated with the ascending colonic mass, which could be due to tumor filtration or could be inflammatory due to wall perforation. No free air or abscess. No bowel obstruction. 3. Central mesenteric and porta hepatis lymphadenopathy suspicious for nodal metastatic disease. 4. Cholelithiasis. No evidence of acute cholecystitis. Mild intrahepatic and extrahepatic biliary ductal dilatation with CBD diameter 10 mm. Recommend correlation with serum bilirubin levels. MRCP may be obtained to evaluate for choledocholithiasis, as clinically warranted. 5. Severe L2 vertebral compression fracture of uncertain chronicity. 6. Chronic findings include: Aortic Atherosclerosis (ICD10-I70.0). Coronary atherosclerosis. Marked sigmoid diverticulosis. Electronically Signed   By: Ilona Sorrel M.D.   On: 08/16/2017 17:23   Dg Chest Port 1 View  Result Date: 08/23/2017 CLINICAL DATA:  Productive cough. EXAM: PORTABLE CHEST 1 VIEW COMPARISON:  August 20, 2017 FINDINGS: There is a background of increased interstitial markings consistent with known interstitial lung disease. However, the interstitial opacity has increased in the interval. Mild opacities in the bases may be associated with small effusions. Mildly more focal opacity seen in the left mid lung as well. The heart size borderline. The hila and mediastinum are unremarkable. No nodules or masses. No focal infiltrates. IMPRESSION: 1. The findings are suggestive of pulmonary edema seen on the background of known interstitial lung disease. 2. Bibasilar opacities are likely associated with small effusions and may represent atelectasis versus infiltrate. 3. More focal opacity in the left mid lung could represent asymmetric edema versus underlying developing infiltrate. Electronically Signed   By: Dorise Bullion III M.D   On: 08/23/2017 07:04      Subjective: No concerns today. Abdominal pain is mild.  Discharge Exam: Vitals:   08/25/17 2044  08/26/17 0622  BP: (!) 170/67 (!) 169/72  Pulse: 77 73  Resp: 16   Temp: 98.6 F (37 C) 99 F (37.2 C)  SpO2: 93% 92%   Vitals:   08/25/17 0513 08/25/17 1414 08/25/17 2044 08/26/17 0622  BP: (!) 159/78 (!) 177/77 (!) 170/67 (!) 169/72  Pulse: 72 69 77 73  Resp: 14 14 16    Temp: 98.5 F (36.9 C) 98 F (36.7 C) 98.6 F (37 C) 99 F (37.2 C)  TempSrc: Oral Oral Oral Oral  SpO2: 91% 96% 93% 92%  Weight: 54.9 kg (121 lb 1.6 oz)     Height:        General: Pt is alert, awake, not in acute distress Cardiovascular: RRR, S1/S2 +, no rubs, no gallops Respiratory: CTA bilaterally, no wheezing, no rhonchi Abdominal: Soft,  NT, ND, bowel sounds +. Staples in place without oozing or surrounding erythema. Extremities: no edema, no cyanosis    The results of significant diagnostics from this hospitalization (including imaging, microbiology, ancillary and laboratory) are listed below for reference.     Microbiology: Recent Results (from the past 240 hour(s))  Surgical pcr screen     Status: None   Collection Time: 08/21/17  5:16 AM  Result Value Ref Range Status   MRSA, PCR NEGATIVE NEGATIVE Final   Staphylococcus aureus NEGATIVE NEGATIVE Final    Comment: (NOTE) The Xpert SA Assay (FDA approved for NASAL specimens in patients 67 years of age and older), is one component of a comprehensive surveillance program. It is not intended to diagnose infection nor to guide or monitor treatment. Performed at [facility], [place] [address], [place], [place] [address]      Labs: BNP (last 3 results) Recent Labs    09/06/16 0428  BNP 443.1*   Basic Metabolic Panel: Recent Labs  Lab 08/20/17 0523 08/21/17 0533 08/22/17 0725 08/24/17 0520 08/26/17 0514  NA 141 137 136 141 140  K 3.8 4.3 4.6 4.4 4.2  CL 108 105 105 108 105  CO2 24 25 23 26 28   GLUCOSE 122* 124* 180* 83 118*  BUN 22 15 20 18 16   CREATININE 0.85 0.77 0.92 0.85 0.83  CALCIUM 8.2* 8.2* 7.7* 7.9*  8.2*   Liver Function Tests: No results for input(s): AST, ALT, ALKPHOS, BILITOT, PROT, ALBUMIN in the last 168 hours. No results for input(s): LIPASE, AMYLASE in the last 168 hours. No results for input(s): AMMONIA in the last 168 hours. CBC: Recent Labs  Lab 08/20/17 0523 08/20/17 1209 08/21/17 0533 08/22/17 0725 08/24/17 0520 08/26/17 0514  WBC 8.7  --  9.1 11.5* 9.2 7.9  HGB 8.0* 8.9* 8.1* 9.6* 9.0* 9.8*  HCT 25.4* 28.7* 25.8* 29.5* 28.0* 31.3*  MCV 89.8  --  89.6 86.8 89.5 89.9  PLT 268  --  259 230 227 264   Cardiac Enzymes: No results for input(s): CKTOTAL, CKMB, CKMBINDEX, TROPONINI in the last 168 hours. BNP: Invalid input(s): POCBNP CBG: Recent Labs  Lab 08/25/17 1651 08/25/17 2106 08/25/17 2135 08/26/17 0746 08/26/17 1155  GLUCAP 118* 152* 160* 127* 122*   D-Dimer No results for input(s): DDIMER in the last 72 hours. Hgb A1c No results for input(s): HGBA1C in the last 72 hours. Lipid Profile No results for input(s): CHOL, HDL, LDLCALC, TRIG, CHOLHDL, LDLDIRECT in the last 72 hours. Thyroid function studies No results for input(s): TSH, T4TOTAL, T3FREE, THYROIDAB in the last 72 hours.  Invalid input(s): FREET3 Anemia work up No results for input(s): VITAMINB12, FOLATE, FERRITIN, TIBC, IRON, RETICCTPCT in the last 72 hours. Urinalysis    Component Value Date/Time   COLORURINE YELLOW 02/14/2017 1553   APPEARANCEUR HAZY (A) 02/14/2017 1553   LABSPEC 1.017 02/14/2017 1553   PHURINE 5.0 02/14/2017 1553   GLUCOSEU NEGATIVE 02/14/2017 1553   HGBUR NEGATIVE 02/14/2017 1553   BILIRUBINUR NEGATIVE 02/14/2017 1553   BILIRUBINUR n 11/29/2013 1621   KETONESUR NEGATIVE 02/14/2017 1553   PROTEINUR NEGATIVE 02/14/2017 1553   UROBILINOGEN 0.2 12/01/2013 1606   NITRITE NEGATIVE 02/14/2017 1553   LEUKOCYTESUR LARGE (A) 02/14/2017 1553   Sepsis Labs Invalid input(s): PROCALCITONIN,  WBC,  LACTICIDVEN Microbiology Recent Results (from the past 240 hour(s))    Surgical pcr screen     Status: None   Collection Time: 08/21/17  5:16 AM  Result Value Ref Range  Status   MRSA, PCR NEGATIVE NEGATIVE Final   Staphylococcus aureus NEGATIVE NEGATIVE Final    Comment: (NOTE) The Xpert SA Assay (FDA approved for NASAL specimens in patients 48 years of age and older), is one component of a comprehensive surveillance program. It is not intended to diagnose infection nor to guide or monitor treatment. Performed at [facility], [place] [address] [place], [place] [address]      SIGNED:   Cordelia Poche, MD [facility] 08/26/2017, 12:09 PM

## 2017-09-03 ENCOUNTER — Telehealth: Payer: Self-pay | Admitting: Nurse Practitioner

## 2017-09-03 NOTE — Telephone Encounter (Signed)
Spoke to [place], Therapist, sports from [employer] and provided her an appt date and time for the pt. Pt has been scheduled to see Lattie Haw Thomas/Dr.Sherrill on 8/9 at 145pm. Caryl Pina is aware the pt should arrive 30 minutes early so that she can be checked in on time.

## 2017-09-08 ENCOUNTER — Telehealth: Payer: Self-pay | Admitting: *Deleted

## 2017-09-08 NOTE — Telephone Encounter (Signed)
Okay to write letter 

## 2017-09-08 NOTE — Telephone Encounter (Signed)
Copied from English [id number]. Topic: General - Other >> Sep 08, 2017 11:49 AM Synthia Innocent wrote: Reason for CRM: Needing a note from provider so post office will deliver mail to her front door. Letter must state she is unable to walk to mail box

## 2017-09-08 NOTE — Telephone Encounter (Signed)
Letter ready for pick up and patient is aware. 

## 2017-09-08 NOTE — Telephone Encounter (Signed)
OK 

## 2017-09-09 ENCOUNTER — Telehealth: Payer: Self-pay | Admitting: Oncology

## 2017-09-09 NOTE — Telephone Encounter (Signed)
Pt cld to reschedule appt to see Dr. Benay Spice on 8/6 at 2pm. She wanted her son to attend the appt with her.

## 2017-09-10 ENCOUNTER — Ambulatory Visit: Payer: Medicare Other | Admitting: Family Medicine

## 2017-09-13 ENCOUNTER — Emergency Department (HOSPITAL_COMMUNITY): Payer: Medicare Other

## 2017-09-13 ENCOUNTER — Other Ambulatory Visit: Payer: Self-pay

## 2017-09-13 ENCOUNTER — Observation Stay (HOSPITAL_COMMUNITY)
Admission: EM | Admit: 2017-09-13 | Discharge: 2017-09-14 | Disposition: A | Discharge status: Home / Self Care | Payer: Medicare Other | Attending: Family Medicine | Admitting: Family Medicine

## 2017-09-13 ENCOUNTER — Encounter (HOSPITAL_COMMUNITY): Payer: Self-pay | Admitting: Emergency Medicine

## 2017-09-13 DIAGNOSIS — I110 Hypertensive heart disease with heart failure: Secondary | ICD-10-CM | POA: Diagnosis not present

## 2017-09-13 DIAGNOSIS — Z882 Allergy status to sulfonamides status: Secondary | ICD-10-CM | POA: Diagnosis not present

## 2017-09-13 DIAGNOSIS — I447 Left bundle-branch block, unspecified: Secondary | ICD-10-CM | POA: Diagnosis not present

## 2017-09-13 DIAGNOSIS — I2510 Atherosclerotic heart disease of native coronary artery without angina pectoris: Secondary | ICD-10-CM | POA: Diagnosis not present

## 2017-09-13 DIAGNOSIS — Z9049 Acquired absence of other specified parts of digestive tract: Secondary | ICD-10-CM | POA: Insufficient documentation

## 2017-09-13 DIAGNOSIS — E785 Hyperlipidemia, unspecified: Secondary | ICD-10-CM | POA: Insufficient documentation

## 2017-09-13 DIAGNOSIS — E119 Type 2 diabetes mellitus without complications: Secondary | ICD-10-CM

## 2017-09-13 DIAGNOSIS — Z86010 Personal history of colonic polyps: Secondary | ICD-10-CM | POA: Insufficient documentation

## 2017-09-13 DIAGNOSIS — E1140 Type 2 diabetes mellitus with diabetic neuropathy, unspecified: Secondary | ICD-10-CM | POA: Diagnosis not present

## 2017-09-13 DIAGNOSIS — Z8719 Personal history of other diseases of the digestive system: Secondary | ICD-10-CM | POA: Insufficient documentation

## 2017-09-13 DIAGNOSIS — S3210XA Unspecified fracture of sacrum, initial encounter for closed fracture: Secondary | ICD-10-CM | POA: Diagnosis not present

## 2017-09-13 DIAGNOSIS — Z7982 Long term (current) use of aspirin: Secondary | ICD-10-CM | POA: Insufficient documentation

## 2017-09-13 DIAGNOSIS — I480 Paroxysmal atrial fibrillation: Secondary | ICD-10-CM | POA: Diagnosis not present

## 2017-09-13 DIAGNOSIS — Y92009 Unspecified place in unspecified non-institutional (private) residence as the place of occurrence of the external cause: Secondary | ICD-10-CM | POA: Diagnosis not present

## 2017-09-13 DIAGNOSIS — M810 Age-related osteoporosis without current pathological fracture: Secondary | ICD-10-CM | POA: Insufficient documentation

## 2017-09-13 DIAGNOSIS — M1990 Unspecified osteoarthritis, unspecified site: Secondary | ICD-10-CM | POA: Diagnosis not present

## 2017-09-13 DIAGNOSIS — D649 Anemia, unspecified: Secondary | ICD-10-CM | POA: Diagnosis not present

## 2017-09-13 DIAGNOSIS — M4856XA Collapsed vertebra, not elsewhere classified, lumbar region, initial encounter for fracture: Secondary | ICD-10-CM | POA: Diagnosis not present

## 2017-09-13 DIAGNOSIS — K219 Gastro-esophageal reflux disease without esophagitis: Secondary | ICD-10-CM | POA: Diagnosis not present

## 2017-09-13 DIAGNOSIS — Z66 Do not resuscitate: Secondary | ICD-10-CM | POA: Insufficient documentation

## 2017-09-13 DIAGNOSIS — Z8673 Personal history of transient ischemic attack (TIA), and cerebral infarction without residual deficits: Secondary | ICD-10-CM | POA: Diagnosis not present

## 2017-09-13 DIAGNOSIS — Z79899 Other long term (current) drug therapy: Secondary | ICD-10-CM | POA: Diagnosis not present

## 2017-09-13 DIAGNOSIS — W19XXXA Unspecified fall, initial encounter: Secondary | ICD-10-CM | POA: Diagnosis not present

## 2017-09-13 DIAGNOSIS — Z881 Allergy status to other antibiotic agents status: Secondary | ICD-10-CM | POA: Diagnosis not present

## 2017-09-13 DIAGNOSIS — M549 Dorsalgia, unspecified: Secondary | ICD-10-CM | POA: Diagnosis present

## 2017-09-13 DIAGNOSIS — Z8249 Family history of ischemic heart disease and other diseases of the circulatory system: Secondary | ICD-10-CM | POA: Insufficient documentation

## 2017-09-13 DIAGNOSIS — I5030 Unspecified diastolic (congestive) heart failure: Secondary | ICD-10-CM | POA: Diagnosis not present

## 2017-09-13 DIAGNOSIS — W010XXA Fall on same level from slipping, tripping and stumbling without subsequent striking against object, initial encounter: Secondary | ICD-10-CM | POA: Diagnosis not present

## 2017-09-13 LAB — CBC WITH DIFFERENTIAL/PLATELET
BASOS ABS: 0 10*3/uL (ref 0.0–0.1)
Basophils Relative: 0 %
EOS ABS: 0.1 10*3/uL (ref 0.0–0.7)
EOS PCT: 1 %
HCT: 33.6 % — ABNORMAL LOW (ref 36.0–46.0)
Hemoglobin: 10.6 g/dL — ABNORMAL LOW (ref 12.0–15.0)
LYMPHS ABS: 1.8 10*3/uL (ref 0.7–4.0)
Lymphocytes Relative: 19 %
MCH: 27.7 pg (ref 26.0–34.0)
MCHC: 31.5 g/dL (ref 30.0–36.0)
MCV: 88 fL (ref 78.0–100.0)
Monocytes Absolute: 0.7 10*3/uL (ref 0.1–1.0)
Monocytes Relative: 7 %
NEUTROS PCT: 73 %
Neutro Abs: 7.2 10*3/uL (ref 1.7–7.7)
PLATELETS: 243 10*3/uL (ref 150–400)
RBC: 3.82 MIL/uL — ABNORMAL LOW (ref 3.87–5.11)
RDW: 15.6 % — AB (ref 11.5–15.5)
WBC: 9.8 10*3/uL (ref 4.0–10.5)

## 2017-09-13 LAB — GLUCOSE, CAPILLARY
GLUCOSE-CAPILLARY: 240 mg/dL — AB (ref 70–99)
Glucose-Capillary: 127 mg/dL — ABNORMAL HIGH (ref 70–99)
Glucose-Capillary: 116 mg/dL — ABNORMAL HIGH (ref 70–99)

## 2017-09-13 LAB — BASIC METABOLIC PANEL
ANION GAP: 10 (ref 5–15)
BUN: 20 mg/dL (ref 8–23)
CHLORIDE: 101 mmol/L (ref 98–111)
CO2: 26 mmol/L (ref 22–32)
CREATININE: 0.7 mg/dL (ref 0.44–1.00)
Calcium: 9 mg/dL (ref 8.9–10.3)
GFR calc Af Amer: >60 mL/min (ref >60)
Glucose, Bld: 148 mg/dL — ABNORMAL HIGH (ref 70–99)
Potassium: 4.4 mmol/L (ref 3.5–5.1)
Sodium: 137 mmol/L (ref 135–145)

## 2017-09-13 LAB — CBG MONITORING, ED: Glucose-Capillary: 159 mg/dL — ABNORMAL HIGH (ref 70–99)

## 2017-09-13 MED ORDER — ADULT MULTIVITAMIN W/MINERALS CH
1.0000 | ORAL_TABLET | Freq: Every day | ORAL | Status: DC
  Administered 2017-09-13 – 2017-09-14 (×2): 1 via ORAL
  Filled 2017-09-13 (×2): qty 1

## 2017-09-13 MED ORDER — ACETAMINOPHEN 500 MG PO TABS
1000.0000 mg | ORAL_TABLET | Freq: Three times a day (TID) | ORAL | Status: DC
  Administered 2017-09-13 – 2017-09-14 (×3): 1000 mg via ORAL
  Filled 2017-09-13 (×3): qty 2

## 2017-09-13 MED ORDER — DOCUSATE SODIUM 100 MG PO CAPS
100.0000 mg | ORAL_CAPSULE | Freq: Two times a day (BID) | ORAL | Status: DC
  Administered 2017-09-13 – 2017-09-14 (×3): 100 mg via ORAL
  Filled 2017-09-13 (×3): qty 1

## 2017-09-13 MED ORDER — PANTOPRAZOLE SODIUM 20 MG PO TBEC
20.0000 mg | DELAYED_RELEASE_TABLET | ORAL | Status: DC
  Administered 2017-09-14: 20 mg via ORAL
  Filled 2017-09-13: qty 1

## 2017-09-13 MED ORDER — ASPIRIN EC 81 MG PO TBEC
81.0000 mg | DELAYED_RELEASE_TABLET | Freq: Every day | ORAL | Status: DC
  Administered 2017-09-13 – 2017-09-14 (×2): 81 mg via ORAL
  Filled 2017-09-13 (×2): qty 1

## 2017-09-13 MED ORDER — INSULIN ASPART 100 UNIT/ML ~~LOC~~ SOLN
0.0000 [IU] | Freq: Three times a day (TID) | SUBCUTANEOUS | Status: DC
  Administered 2017-09-14: 1 [IU] via SUBCUTANEOUS

## 2017-09-13 MED ORDER — POLYETHYLENE GLYCOL 3350 17 G PO PACK: 17.0000 g | PACK | Freq: Every day | ORAL | Status: DC | PRN

## 2017-09-13 MED ORDER — OXYCODONE HCL 5 MG PO TABS
2.5000 mg | ORAL_TABLET | Freq: Four times a day (QID) | ORAL | Status: DC | PRN
  Administered 2017-09-13: 2.5 mg via ORAL
  Filled 2017-09-13: qty 1

## 2017-09-13 MED ORDER — GABAPENTIN 100 MG PO CAPS
100.0000 mg | ORAL_CAPSULE | Freq: Every day | ORAL | Status: DC
  Administered 2017-09-13: 100 mg via ORAL
  Filled 2017-09-13: qty 1

## 2017-09-13 MED ORDER — DICLOFENAC SODIUM 1 % TD GEL
2.0000 g | Freq: Four times a day (QID) | TRANSDERMAL | Status: DC
  Administered 2017-09-13 – 2017-09-14 (×5): 2 g via TOPICAL
  Filled 2017-09-13: qty 100

## 2017-09-13 MED ORDER — OMEGA-3-ACID ETHYL ESTERS 1 G PO CAPS
1.0000 g | ORAL_CAPSULE | Freq: Every day | ORAL | Status: DC
  Administered 2017-09-13 – 2017-09-14 (×2): 1 g via ORAL
  Filled 2017-09-13 (×2): qty 1

## 2017-09-13 MED ORDER — ATORVASTATIN CALCIUM 10 MG PO TABS
20.0000 mg | ORAL_TABLET | Freq: Every day | ORAL | Status: DC
  Administered 2017-09-13: 20 mg via ORAL
  Filled 2017-09-13: qty 2

## 2017-09-13 MED ORDER — METOPROLOL TARTRATE 12.5 MG HALF TABLET
12.5000 mg | ORAL_TABLET | Freq: Two times a day (BID) | ORAL | Status: DC
  Administered 2017-09-13 – 2017-09-14 (×3): 12.5 mg via ORAL
  Filled 2017-09-13 (×3): qty 1

## 2017-09-13 MED ORDER — CYANOCOBALAMIN 250 MCG PO TABS
ORAL_TABLET | Freq: Every day | ORAL | Status: DC
  Filled 2017-09-13: qty 1

## 2017-09-13 MED ORDER — VITAMIN D3 25 MCG (1000 UNIT) PO TABS
1000.0000 [IU] | ORAL_TABLET | Freq: Every day | ORAL | Status: DC
  Administered 2017-09-13 – 2017-09-14 (×2): 1000 [IU] via ORAL
  Filled 2017-09-13 (×2): qty 1

## 2017-09-13 MED ORDER — OMEGA-3 FATTY ACIDS 1000 MG PO CAPS: 1.0000 | ORAL_CAPSULE | Freq: Every day | ORAL | Status: DC

## 2017-09-13 MED ORDER — LIDOCAINE 5 % EX PTCH
1.0000 | MEDICATED_PATCH | CUTANEOUS | Status: DC
  Administered 2017-09-13: 1 via TRANSDERMAL
  Filled 2017-09-13 (×2): qty 1

## 2017-09-13 MED ORDER — CALCIUM CARBONATE 200 MG PO CAPS: 200.0000 mg | ORAL_CAPSULE | Freq: Every day | ORAL | Status: DC

## 2017-09-13 MED ORDER — INSULIN ASPART 100 UNIT/ML ~~LOC~~ SOLN
0.0000 [IU] | Freq: Every day | SUBCUTANEOUS | Status: DC
  Administered 2017-09-13: 2 [IU] via SUBCUTANEOUS

## 2017-09-13 MED ORDER — TRAMADOL HCL 50 MG PO TABS
50.0000 mg | ORAL_TABLET | Freq: Once | ORAL | Status: AC
  Administered 2017-09-13: 50 mg via ORAL
  Filled 2017-09-13: qty 1

## 2017-09-13 MED ORDER — VITAMIN B-12 1000 MCG PO TABS
1000.0000 ug | ORAL_TABLET | Freq: Every day | ORAL | Status: DC
  Administered 2017-09-13 – 2017-09-14 (×2): 1000 ug via ORAL
  Filled 2017-09-13 (×2): qty 1

## 2017-09-13 MED ORDER — CALCIUM CARBONATE ANTACID 500 MG PO CHEW
1.0000 | CHEWABLE_TABLET | Freq: Every day | ORAL | Status: DC
  Administered 2017-09-13 – 2017-09-14 (×2): 200 mg via ORAL
  Filled 2017-09-13 (×2): qty 1

## 2017-09-13 NOTE — ED Notes (Signed)
Patient transported to X-ray 

## 2017-09-13 NOTE — Clinical Social Work Note (Signed)
Clinical Social Work Assessment  Patient Details  Name: [name] MRN: [id number] Date of Birth: Dec 01, 1920  Date of referral:  09/13/17               Reason for consult:  Discharge Planning, Other (Comment Required)(SNF)                Permission sought to share information with:  Case Manager Permission granted to share information::  Yes, Verbal Permission Granted  Name::     Case Management  Agency::     Relationship::     Contact Information:     Housing/Transportation Living arrangements for the past 2 months:  Single Family Home Source of Information:  Patient Patient Interpreter Needed:  None Criminal Activity/Legal Involvement Pertinent to Current Situation/Hospitalization:  No - Comment as needed Significant Relationships:  Adult Children, Other(Comment)(Patient has hired help that comes by several times each week to assist patient with grocery shopping and transportation to appointments.) Lives with:  Self Do you feel safe going back to the place where you live?  Yes Need for family participation in patient care:  No (Coment)  Care giving concerns:  Patient presents to [facility] from home. CSW consulted for assessment and possible SNF placement. Patient fell and is experiencing pain as a result of the fall. Patient is 84 years old and lives alone. Patient has colon surgery in July.   Social Worker assessment / plan:   CSW met with patient to complete assessment and inquired about perceptions of home health and SNF if determined to be appropriate for patient.  Patient lives alone in her own home in [place] and has hired help come by regularly to assist with grocery shopping and transportation to appointments. Patient has three adult children, none of whom live in the area. Son [name] lives in [place], [place], son [name] lives in the [place], and daughter [name] lives in [place]. Patient spoke of a long term plan to possibly move to [facility], but she  does not feel that she could transport at this time and does not have living arrangements.  Patient had colon surgery on July 11th and went to [facility] SNF following surgery. Patient reportedly had a negative experience with this SNF but is receptive to going to another SNF, "I'll do what I have to do if it will be beneficial."   Patient currently has home health, through [facility]. Home Health comes twice each week. Patient does not have PT/OT.  CSW aware that labs have been ordered for patient and unsure if patient will be admitted inpatient at the time of this assessment.  Employment status:  Retired Engineer, maintenance (IT)) PT Recommendations:  Not assessed at this time Information / Referral to community resources:  [place]  Patient/Family's Response to care:   Patient was oriented x4, pleasant, appreciative, and engaged. Patient thanked Education officer, museum for efforts.  Patient/Family's Understanding of and Emotional Response to Diagnosis, Current Treatment, and Prognosis:   Patient aware of barriers to SNF placement and of pending lab work. Patient agreeable to possible SNF placement "if rehab would be beneficial." Patient agreeable to case management consult for additional home health or PT/OT resources.   Emotional Assessment Appearance:  Appears stated age, Well-Groomed Attitude/Demeanor/Rapport:  Gracious, Engaged Affect (typically observed):  Accepting, Adaptable, Calm, Pleasant Orientation:  Oriented to Self, Oriented to Place, Oriented to  Time, Oriented to Situation Alcohol / Substance use:    Psych involvement (Current and /  or in the community):  No (Comment)  Discharge Needs  Concerns to be addressed:  Home Safety Concerns Readmission within the last 30 days:  No Current discharge risk:  Lives alone Barriers to Discharge:  [place], [place] 09/13/2017, 9:54 AM

## 2017-09-13 NOTE — ED Triage Notes (Signed)
Pt from home reports fall on to buttock from standing c/o left hip and lumbar pain no shortening or rotation bilat LE good pulse both feet. Pt alert x3

## 2017-09-13 NOTE — ED Notes (Signed)
Bed: WA26 Expected date:  Expected time:  Means of arrival:  Comments: 

## 2017-09-13 NOTE — H&P (Addendum)
History and Physical    [name]:[id number] DOB: [birth year] DOA: 09/13/2017  PCP: Eulas Post, MD Patient coming from: home  I have personally briefly reviewed patient's old medical records in [place]  Chief Complaint: fall  HPI: [name] is [name] 84 y.o. female with medical history significant of CAD, T2DM, recent right hemicolectomy for adenocarcinoma of colon, TIA, HTN, HLD presenting after [name] fall, unable to ambulate in the ED.  She notes she was getting up to go to get [name] glass of water, she turned and lost her balance and fell straight down.  She denies any head trauma, LH, loss of consciousness.  She alerted EMS and had to crawl to front door to let them in.  She denies any pain at rest, but notes pain with movement.  She denies numbness, tingling, weakness, CP, SOB, abdominal pain, or nausea, vomiting.    ED Course: Labs, imaging.  Notable for possible nondisplaced sacral fracture.  Pt unable to ambulate, hopsitalist requested to admit.   Review of Systems: As per HPI otherwise 10 point review of systems negative.   Past Medical History:  Diagnosis Date  . ANEMIA, NORMOCYTIC 11/30/2009  . CAD (coronary artery disease) 04/27/2008   Non-Obstructive >> Cardiac catheterization 6/08: EF 55%, 1+ MR, Dx 50%, LAD 50%, dLAD 40%, OM1 50-60% (up to 70%), pOM 30%, pRCA 40%, mRCA 50-60%, pPDA 50-60% (up to 70%).  Medical therapy was recommended // MV 11/14: Normal stress nuclear study. LV Ejection Fraction: 67%.   . Carotid artery disease ([place])    Left CEA in 10/12.  Carotid dopplers (10/15) with 1-39% bilateral ICA stenosis. // Carotid US 10/17: R < 40; patent L CEA site  . COLONIC POLYPS, ADENOMATOUS 12/26/2009  . DIABETES MELLITUS, TYPE II 08/11/2006  . DIVERTICULOSIS, COLON 08/11/2006  . Fall 09/06/2016  . GERD 02/16/2007  . Hemorrhoids    s/p banding  . History of echocardiogram    Echo 5/18: EF 60-65, no RWMA, Gr 2 DD, mild-mod MR, mod LAE // Echo 2/17: Mild  LVH, mild focal basal septal hypertrophy, EF 55-60%, no RWMA, Gr 1 DD, MAC, mild MR, mild  // Echo 4/14: EF 60-65%, normal wall motion, Gr 1 DD, MAC, mild MR, PASP 32, reduced excursion of AV noncoronary cusp.    Marland Kitchen HYPERLIPIDEMIA 09/09/2006  . HYPERTENSION 09/09/2006  . LEFT BUNDLE BRANCH BLOCK 08/11/2006  . Open fracture of nasal bones   . OSTEOARTHRITIS 08/11/2006  . OSTEOPOROSIS 08/11/2006  . PAF (paroxysmal atrial fibrillation) ([place]) 04/12/2009   Paroxysmal, only prior documented episode was years ago.  She was not started on anticoagulation back then due to hemorrhoidal bleeding, apparently profuse.  Event monitor (3/14) showed only NSR.    Marland Kitchen Stroke ([place]) 12-06-13  . Syncope    8/15. EEG (5/15) was unremarkable. // 4/18: ILR neg for arrhythmia - ? orthostatic (no prodrome reported) >> c/b SAH (small)   . TIA 09/02/2006   Echocardiogram 6/12: Mild to moderate MR, trace AI, trace TR, PASP 34, bubble study negative for intracardiac shunting, normal EF (greater than 55%) //  Possible TIA in 10/15 with visual field cut.  MRI (10/15) with no CVA. Linq monitor placed.    Past Surgical History:  Procedure Laterality Date  . BIOPSY  08/20/2017   Procedure: BIOPSY;  Surgeon: Mauri Pole, MD;  Location: [facility];  Service: Endoscopy;;  . CAROTID ENDARTERECTOMY Left 12-05-2010  . CLOSED REDUCTION METATARSAL FRACTURE     right  5th  . COLONOSCOPY W/ POLYPECTOMY  2001  . COLONOSCOPY WITH PROPOFOL N/Anneta Rounds 08/20/2017   Procedure: COLONOSCOPY WITH PROPOFOL;  Surgeon: Mauri Pole, MD;  Location: [facility];  Service: Endoscopy;  Laterality: N/Lennell Shanks;  . EYE SURGERY     catarac  . LAPAROSCOPIC PARTIAL COLECTOMY Right 08/21/2017   Procedure: LAPAROSCOPIC RIGHT COLECTOMY;  Surgeon: Johnathan Hausen, MD;  Location: [facility] ORS;  Service: General;  Laterality: Right;  . LOOP RECORDER IMPLANT N/Carnell Beavers 01/03/2014   Procedure: LOOP RECORDER IMPLANT;  Surgeon: Evans Lance, MD;  Location: [facility] C[facility] LAB;  Service:  Cardiovascular;  Laterality: N/Ayomide Zuleta;  . SCHLEROTHERAPY  08/20/2017   Procedure: Woodward Ku;  Surgeon: Mauri Pole, MD;  Location: [facility];  Service: Endoscopy;;  spot tattoo  . TONSILLECTOMY       reports that she has never smoked. She has never used smokeless tobacco. She reports that she does not drink alcohol or use drugs.  Allergies  Allergen Reactions  . Sulfamethoxazole-Trimethoprim     unknown  . Sulfonamide Derivatives     REACTION: rash  . Nitrofurantoin     Unknown. Macrobid.     Family History  Problem Relation Age of Onset  . Heart disease Mother 49  . Kidney disease Mother   . Heart attack Mother   . Congestive Heart Failure Father 70  . Colon cancer Brother   . Heart disease Brother   . Breast cancer Sister   . Uterine cancer Sister    Prior to Admission medications   Medication Sig Start Date End Date Taking? Authorizing Provider  acetaminophen (TYLENOL) 325 MG tablet Take 2 tablets (650 mg total) by mouth every 6 (six) hours as needed for mild pain. 08/26/17   Mariel Aloe, MD  Ascorbic Acid (VITAMIN C PO) Take 1 tablet by mouth daily.     [provider]  aspirin EC 81 MG tablet Take 1 tablet (81 mg total) by mouth daily. 07/05/16   [name], PA-C  atorvastatin (LIPITOR) 20 MG tablet TAKE 1 TABLET DAILY AT 6 P.M. 07/08/17   [name], PA-C  calcium carbonate 200 MG capsule Take 200 mg by mouth daily.     [provider]  Cholecalciferol (VITAMIN D PO) Take 1 tablet by mouth daily.     [provider]  Cyanocobalamin (VITAMIN B 12 PO) Take 1 tablet by mouth daily.     [provider]  docusate sodium (COLACE) 100 MG capsule Take 1 capsule (100 mg total) by mouth 2 (two) times daily. Patient taking differently: Take 100 mg by mouth 2 (two) times daily as needed for mild constipation.  06/04/16   Geradine Girt, DO  fish oil-omega-3 fatty acids 1000 MG capsule Take 1 capsule by mouth daily.      [provider]  gabapentin (NEURONTIN) 100 MG capsule Take 1 capsule (100 mg total) by mouth at bedtime. Toe pain 08/13/17   Burchette, Alinda Sierras, MD  glucose blood test strip Use once daily 12/01/14   Burchette, Alinda Sierras, MD  metoprolol tartrate (LOPRESSOR) 25 MG tablet TAKE ONE-HALF (1/2) TABLET TWICE Jadence Kinlaw DAY 11/25/16   Burchette, Alinda Sierras, MD  mirtazapine (REMERON) 7.5 MG tablet Take 1 tablet (7.5 mg total) by mouth at bedtime. Patient not taking: Reported on 08/16/2017 07/09/17   Eulas Post, MD  Multiple Vitamins-Minerals (MULTIVITAMIN WITH MINERALS) tablet Take 1 tablet by mouth daily.    [provider]  ONE TOUCH LANCETS MISC Use  daily as directed 03/25/11   Burchette, Alinda Sierras, MD  pantoprazole (PROTONIX) 20 MG tablet TAKE 1 TABLET EVERY OTHER DAY Patient taking differently: Take 0.5 tablet (10 mg) by mouth every other day 11/25/16   Eulas Post, MD  polyethylene glycol (MIRALAX / GLYCOLAX) packet Take 17 g by mouth daily as needed for moderate constipation.    [provider]  VITAMIN E PO Take 1 tablet by mouth daily.     [provider]    Physical Exam: Vitals:   09/13/17 0207 09/13/17 0600 09/13/17 0613 09/13/17 0816  BP: (!) 165/76 (!) 162/75 (!) 162/75 (!) 136/95  Pulse: 90 96  95  Resp: 17 18 18 18   Temp: 97.9 F (36.6 C)  98.7 F (37.1 C) 98.4 F (36.9 C)  TempSrc: Oral   Oral  SpO2: 98% 95%  96%    Constitutional: NAD, calm, comfortable Vitals:   09/13/17 0207 09/13/17 0600 09/13/17 0613 09/13/17 0816  BP: (!) 165/76 (!) 162/75 (!) 162/75 (!) 136/95  Pulse: 90 96  95  Resp: 17 18 18 18   Temp: 97.9 F (36.6 C)  98.7 F (37.1 C) 98.4 F (36.9 C)  TempSrc: Oral   Oral  SpO2: 98% 95%  96%   Eyes: PERRL, lids and conjunctivae normal ENMT: Mucous membranes are moist. Posterior pharynx clear of any exudate or lesions.Normal dentition.  Neck: normal, supple, no masses, no thyromegaly Respiratory: clear to auscultation  bilaterally, no wheezing, no crackles. Normal respiratory effort. No accessory muscle use.  Cardiovascular: Regular rate and rhythm, no murmurs / rubs / gallops. No extremity edema. 2+ pedal pulses. No carotid bruits.  Abdomen: no tenderness, no masses palpated. No hepatosplenomegaly. Bowel sounds positive.  Musculoskeletal: TTP to T spine, no other focal TTP.  Moving all extremities equally.  Pain when lifting R leg.   Skin: healing abdominal incision Neurologic: CN 2-12 grossly intact. Sensation intact. Strength 5/5 in all 4.  Psychiatric: Normal judgment and insight. Alert and oriented x 3. Normal mood.   Labs on Admission: I have personally reviewed following labs and imaging studies  CBC: Recent Labs  Lab 09/13/17 0836  WBC 9.8  NEUTROABS 7.2  HGB 10.6*  HCT 33.6*  MCV 88.0  PLT 660   Basic Metabolic Panel: Recent Labs  Lab 09/13/17 0836  NA 137  K 4.4  CL 101  CO2 26  GLUCOSE 148*  BUN 20  CREATININE 0.70  CALCIUM 9.0   GFR: CrCl cannot be calculated (Unknown ideal weight.). Liver Function Tests: No results for input(s): AST, ALT, ALKPHOS, BILITOT, PROT, ALBUMIN in the last 168 hours. No results for input(s): LIPASE, AMYLASE in the last 168 hours. No results for input(s): AMMONIA in the last 168 hours. Coagulation Profile: No results for input(s): INR, PROTIME in the last 168 hours. Cardiac Enzymes: No results for input(s): CKTOTAL, CKMB, CKMBINDEX, TROPONINI in the last 168 hours. BNP (last 3 results) No results for input(s): PROBNP in the last 8760 hours. HbA1C: No results for input(s): HGBA1C in the last 72 hours. CBG: No results for input(s): GLUCAP in the last 168 hours. Lipid Profile: No results for input(s): CHOL, HDL, LDLCALC, TRIG, CHOLHDL, LDLDIRECT in the last 72 hours. Thyroid Function Tests: No results for input(s): TSH, T4TOTAL, FREET4, T3FREE, THYROIDAB in the last 72 hours. Anemia Panel: No results for input(s): VITAMINB12, FOLATE,  FERRITIN, TIBC, IRON, RETICCTPCT in the last 72 hours. Urine analysis:    Component Value Date/Time   COLORURINE YELLOW 02/14/2017  [place] ([place]) 02/14/2017 1553   LABSPEC 1.017 02/14/2017 1553   PHURINE 5.0 02/14/2017 1553   GLUCOSEU NEGATIVE 02/14/2017 1553   HGBUR NEGATIVE 02/14/2017 1553   BILIRUBINUR NEGATIVE 02/14/2017 1553   BILIRUBINUR n 11/29/2013 1621   KETONESUR NEGATIVE 02/14/2017 1553   PROTEINUR NEGATIVE 02/14/2017 1553   UROBILINOGEN 0.2 12/01/2013 1606   NITRITE NEGATIVE 02/14/2017 1553   LEUKOCYTESUR LARGE (Milind Raether) 02/14/2017 1553    Radiological Exams on Admission: Dg Lumbar Spine Complete  Result Date: 09/13/2017 CLINICAL DATA:  Lumbosacral and sacrococcygeal pain after fall. EXAM: LUMBAR SPINE - COMPLETE 4+ VIEW COMPARISON:  Reformats from abdominal CT 08/16/2017 FINDINGS: Unchanged L2 compression fracture with bony retropulsion, unchanged in appearance compared to CT. Mild focal kyphosis at this level unchanged. Remaining vertebral body heights are preserved. No acute fracture. The bones are under mineralized. Multilevel facet arthropathy. Aortic atherosclerosis. IMPRESSION: 1. Chronic unchanged L2 compression fracture since CT 1 month ago. 2. No acute fracture of the lumbar spine. Electronically Signed   By: Jeb Levering M.D.   On: 09/13/2017 03:03   Dg Sacrum/coccyx  Result Date: 09/13/2017 CLINICAL DATA:  Lumbosacral and sacrococcygeal pain after fall. EXAM: SACRUM AND COCCYX - 2+ VIEW COMPARISON:  Reformats from abdominal CT 08/16/2017 FINDINGS: There is no evidence of fracture or other focal bone lesions. Similar sacrococcygeal junction to CT. Sacral ala are maintained. Sacroiliac joints are congruent. Bones are under mineralized. IMPRESSION: No evidence of sacral fracture. Electronically Signed   By: Jeb Levering M.D.   On: 09/13/2017 03:04   Ct Pelvis Wo Contrast  Result Date: 09/13/2017 CLINICAL DATA:  Fall with pelvic pain, unable to bear  weight. EXAM: CT PELVIS WITHOUT CONTRAST TECHNIQUE: Multidetector CT imaging of the pelvis was performed following the standard protocol without intravenous contrast. COMPARISON:  Abdominopelvic CT 08/16/2017 FINDINGS: Urinary Tract: Urinary bladder is distended without wall thickening. Distal ureters are nondistended. Bowel: Enteric sutures in the transverse colon. Moderate stool in the sigmoid colon. No colonic inflammation. Vascular/Lymphatic: Aorta bi-iliac atherosclerosis.  No adenopathy. Reproductive:  Atrophic uterus.  No adnexal mass. Other:  No pelvic free fluid. Musculoskeletal: Findings suspicious for nondisplaced left sacral fracture with cortical buckling. Sacroiliac joints are congruent. Chondrocalcinosis of the pubic symphysis. No additional fracture of the pelvis or hips. Pubic rami are intact. Bones diffusely under mineralized. IMPRESSION: Findings suspicious for nondisplaced left sacral fracture. Electronically Signed   By: Jeb Levering M.D.   On: 09/13/2017 05:03   Dg Hip Unilat With Pelvis 2-3 Views Left  Result Date: 09/13/2017 CLINICAL DATA:  Left hip pain after Antoinetta Berrones fall. EXAM: DG HIP (WITH OR WITHOUT PELVIS) 2-3V LEFT COMPARISON:  Pelvis x-ray 09/05/2016 FINDINGS: No evidence of fracture. SI joints and symphysis pubis unremarkable. Degenerative changes are noted in the right hip. AP and frog-leg lateral views of the left hip show no femoral neck fracture. IMPRESSION: Negative. Electronically Signed   By: Misty Stanley M.D.   On: 09/13/2017 02:23    EKG: Independently reviewed. none  Assessment/Plan Active Problems:   Fall   Fall  Nondisplaced Left Sacral Fracture:  Pt with mechanical fall, lost her balance and fell straight onto her bottom.  No head trauma or LOC.  She's unable to ambulate in ED due to pain.   Imaging with nondisplaced L sacral fracture Chronic L2 compression fracture Follow T spine images as pain to back Scheduled APAP, voltaren, lidocaine patch, oxycodone  prn PT/OT  Adenocarinoma of the colon: S/p R hemicolectomy on  7/11.  Wound is healing well. Looks like plan was for outpatient follow up per previous d/c summary (pt notes her appointment is on Tuesday - this may need to be rescheduled)  T2DM: diet controlled, SSI.  Neurontin for peripheral neuropathy.   CAD  Hx TIA: continue ASA, statin.  Looks like plavix was d/c'd at previous hospitalization.  HFpEF: appears euvolemic  Hx Afib: rate controlled, continue metop, not on anticoagulation  Anemia: chronic, stable, follow  DVT prophylaxis: scd  Code Status: DNR  Family Communication: discussed with son Disposition Plan: pending improvement vs possible placement  Consults called: none  Admission status: observation    Fayrene Helper MD [facility] Pager [phone]  If 7PM-7AM, please contact night-coverage [email or web address] Password [facility]  09/13/2017, 10:43 AM

## 2017-09-13 NOTE — ED Notes (Signed)
Patient transported to CT 

## 2017-09-13 NOTE — ED Notes (Signed)
Patient return from CT

## 2017-09-13 NOTE — ED Provider Notes (Signed)
[facility] DEPT Provider Note: Georgena Spurling, MD, FACEP  CSN: [id number] MRN: [id number] ARRIVAL: 09/13/17 at [place]: Ruth OF PRESENT ILLNESS  09/13/17 2:16 AM Lelon Frohlich SANIYYA GAU is a 84 y.o. female who fell backwards landing on her buttocks from a standing position just prior to arrival.  She subsequently has had pain in her lumbosacral area.  The pain was severe enough that she unable to stand.  The pain is worse with movement or coughing.  EMS also reports she was complaining of pain in her left hip but there is no shortening or rotation or pain with passive movement of her hip joints.  She recently had laparoscopic abdominal surgery.   Past Medical History:  Diagnosis Date  . ANEMIA, NORMOCYTIC 11/30/2009  . CAD (coronary artery disease) 04/27/2008   Non-Obstructive >> Cardiac catheterization 6/08: EF 55%, 1+ MR, Dx 50%, LAD 50%, dLAD 40%, OM1 50-60% (up to 70%), pOM 30%, pRCA 40%, mRCA 50-60%, pPDA 50-60% (up to 70%).  Medical therapy was recommended // MV 11/14: Normal stress nuclear study. LV Ejection Fraction: 67%.   . Carotid artery disease ([place])    Left CEA in 10/12.  Carotid dopplers (10/15) with 1-39% bilateral ICA stenosis. // Carotid US 10/17: R < 40; patent L CEA site  . COLONIC POLYPS, ADENOMATOUS 12/26/2009  . DIABETES MELLITUS, TYPE II 08/11/2006  . DIVERTICULOSIS, COLON 08/11/2006  . Fall 09/06/2016  . GERD 02/16/2007  . Hemorrhoids    s/p banding  . History of echocardiogram    Echo 5/18: EF 60-65, no RWMA, Gr 2 DD, mild-mod MR, mod LAE // Echo 2/17: Mild LVH, mild focal basal septal hypertrophy, EF 55-60%, no RWMA, Gr 1 DD, MAC, mild MR, mild  // Echo 4/14: EF 60-65%, normal wall motion, Gr 1 DD, MAC, mild MR, PASP 32, reduced excursion of AV noncoronary cusp.    Marland Kitchen HYPERLIPIDEMIA 09/09/2006  . HYPERTENSION 09/09/2006  . LEFT BUNDLE BRANCH BLOCK 08/11/2006  . Open fracture of nasal bones   . OSTEOARTHRITIS 08/11/2006  .  OSTEOPOROSIS 08/11/2006  . PAF (paroxysmal atrial fibrillation) ([place]) 04/12/2009   Paroxysmal, only prior documented episode was years ago.  She was not started on anticoagulation back then due to hemorrhoidal bleeding, apparently profuse.  Event monitor (3/14) showed only NSR.    Marland Kitchen Stroke ([place]) 12-06-13  . Syncope    8/15. EEG (5/15) was unremarkable. // 4/18: ILR neg for arrhythmia - ? orthostatic (no prodrome reported) >> c/b SAH (small)   . TIA 09/02/2006   Echocardiogram 6/12: Mild to moderate MR, trace AI, trace TR, PASP 34, bubble study negative for intracardiac shunting, normal EF (greater than 55%) //  Possible TIA in 10/15 with visual field cut.  MRI (10/15) with no CVA. Linq monitor placed.    Past Surgical History:  Procedure Laterality Date  . BIOPSY  08/20/2017   Procedure: BIOPSY;  Surgeon: Mauri Pole, MD;  Location: [facility] ENDOSCOPY;  Service: Endoscopy;;  . CAROTID ENDARTERECTOMY Left 12-05-2010  . CLOSED REDUCTION METATARSAL FRACTURE     right 5th  . COLONOSCOPY W/ POLYPECTOMY  2001  . COLONOSCOPY WITH PROPOFOL N/A 08/20/2017   Procedure: COLONOSCOPY WITH PROPOFOL;  Surgeon: Mauri Pole, MD;  Location: [facility] ENDOSCOPY;  Service: Endoscopy;  Laterality: N/A;  . EYE SURGERY     catarac  . LAPAROSCOPIC PARTIAL COLECTOMY Right 08/21/2017   Procedure: LAPAROSCOPIC RIGHT COLECTOMY;  Surgeon: Johnathan Hausen, MD;  Location:  [facility] ORS;  Service: General;  Laterality: Right;  . LOOP RECORDER IMPLANT N/A 01/03/2014   Procedure: LOOP RECORDER IMPLANT;  Surgeon: Evans Lance, MD;  [facility];  Service: Cardiovascular;  Laterality: N/A;  . SCHLEROTHERAPY  08/20/2017   Procedure: Woodward Ku;  Surgeon: Mauri Pole, MD;  Location: [facility] ENDOSCOPY;  Service: Endoscopy;;  spot tattoo  . TONSILLECTOMY      Family History  Problem Relation Age of Onset  . Heart disease Mother 54  . Kidney disease Mother   . Heart attack Mother   . Congestive Heart Failure  Father 87  . Colon cancer Brother   . Heart disease Brother   . Breast cancer Sister   . Uterine cancer Sister     Social History   Tobacco Use  . Smoking status: Never Smoker  . Smokeless tobacco: Never Used  Substance Use Topics  . Alcohol use: No    Alcohol/week: 0.0 oz  . Drug use: No    Prior to Admission medications   Medication Sig Start Date End Date Taking? Authorizing Provider  acetaminophen (TYLENOL) 325 MG tablet Take 2 tablets (650 mg total) by mouth every 6 (six) hours as needed for mild pain. 08/26/17   Mariel Aloe, MD  Ascorbic Acid (VITAMIN C PO) Take 1 tablet by mouth daily.     [provider]  aspirin EC 81 MG tablet Take 1 tablet (81 mg total) by mouth daily. 07/05/16   [name], PA-C  atorvastatin (LIPITOR) 20 MG tablet TAKE 1 TABLET DAILY AT 6 P.M. 07/08/17   [name], PA-C  calcium carbonate 200 MG capsule Take 200 mg by mouth daily.     [provider]  Cholecalciferol (VITAMIN D PO) Take 1 tablet by mouth daily.     [provider]  Cyanocobalamin (VITAMIN B 12 PO) Take 1 tablet by mouth daily.     [provider]  docusate sodium (COLACE) 100 MG capsule Take 1 capsule (100 mg total) by mouth 2 (two) times daily. Patient taking differently: Take 100 mg by mouth 2 (two) times daily as needed for mild constipation.  06/04/16   Geradine Girt, DO  fish oil-omega-3 fatty acids 1000 MG capsule Take 1 capsule by mouth daily.     [provider]  gabapentin (NEURONTIN) 100 MG capsule Take 1 capsule (100 mg total) by mouth at bedtime. Toe pain 08/13/17   Burchette, Alinda Sierras, MD  glucose blood test strip Use once daily 12/01/14   Burchette, Alinda Sierras, MD  metoprolol tartrate (LOPRESSOR) 25 MG tablet TAKE ONE-HALF (1/2) TABLET TWICE A DAY 11/25/16   Burchette, Alinda Sierras, MD  mirtazapine (REMERON) 7.5 MG tablet Take 1 tablet (7.5 mg total) by mouth at bedtime. Patient not taking: Reported on 08/16/2017 07/09/17    Eulas Post, MD  Multiple Vitamins-Minerals (MULTIVITAMIN WITH MINERALS) tablet Take 1 tablet by mouth daily.    [provider]  ONE TOUCH LANCETS MISC Use daily as directed 03/25/11   Burchette, Alinda Sierras, MD  pantoprazole (PROTONIX) 20 MG tablet TAKE 1 TABLET EVERY OTHER DAY Patient taking differently: Take 0.5 tablet (10 mg) by mouth every other day 11/25/16   Eulas Post, MD  polyethylene glycol (MIRALAX / GLYCOLAX) packet Take 17 g by mouth daily as needed for moderate constipation.    [provider]  VITAMIN E PO Take 1 tablet by mouth daily.     [provider]  Allergies Sulfamethoxazole-trimethoprim; Sulfonamide derivatives; and Nitrofurantoin   REVIEW OF SYSTEMS  Negative except as noted here or in the History of Present Illness.   PHYSICAL EXAMINATION  Initial Vital Signs Blood pressure (!) 165/76, pulse 90, temperature 97.9 F (36.6 C), temperature source Oral, resp. rate 17, SpO2 98 %.  Examination General: Well-developed, well-nourished female in no acute distress; appearance consistent with age of record HENT: normocephalic; atraumatic Eyes: pupils equal, round and reactive to light; extraocular muscles intact; bilateral pseudophakia Neck: supple Heart: regular rate and rhythm Lungs: clear to auscultation bilaterally Abdomen: soft; nondistended; nontender; well-healing recent laparoscopic surgical incisions; bowel sounds present Back: Lumbosacral tenderness Extremities: No deformity; no pain on passive range of motion; no shortening or rotation of lower extremities; pulses normal Neurologic: Awake, alert and oriented; motor function intact in all extremities and symmetric; no facial droop Skin: Warm and dry Psychiatric: Normal mood and affect   RESULTS  Summary of this visit's results, reviewed by myself:   EKG Interpretation  Date/Time:    Ventricular Rate:    PR Interval:    QRS Duration:   QT Interval:    QTC  Calculation:   R Axis:     Text Interpretation:        Laboratory Studies: No results found for this or any previous visit (from the past 24 hour(s)). Imaging Studies: Dg Lumbar Spine Complete  Result Date: 09/13/2017 CLINICAL DATA:  Lumbosacral and sacrococcygeal pain after fall. EXAM: LUMBAR SPINE - COMPLETE 4+ VIEW COMPARISON:  Reformats from abdominal CT 08/16/2017 FINDINGS: Unchanged L2 compression fracture with bony retropulsion, unchanged in appearance compared to CT. Mild focal kyphosis at this level unchanged. Remaining vertebral body heights are preserved. No acute fracture. The bones are under mineralized. Multilevel facet arthropathy. Aortic atherosclerosis. IMPRESSION: 1. Chronic unchanged L2 compression fracture since CT 1 month ago. 2. No acute fracture of the lumbar spine. Electronically Signed   By: Jeb Levering M.D.   On: 09/13/2017 03:03   Dg Sacrum/coccyx  Result Date: 09/13/2017 CLINICAL DATA:  Lumbosacral and sacrococcygeal pain after fall. EXAM: SACRUM AND COCCYX - 2+ VIEW COMPARISON:  Reformats from abdominal CT 08/16/2017 FINDINGS: There is no evidence of fracture or other focal bone lesions. Similar sacrococcygeal junction to CT. Sacral ala are maintained. Sacroiliac joints are congruent. Bones are under mineralized. IMPRESSION: No evidence of sacral fracture. Electronically Signed   By: Jeb Levering M.D.   On: 09/13/2017 03:04   Ct Pelvis Wo Contrast  Result Date: 09/13/2017 CLINICAL DATA:  Fall with pelvic pain, unable to bear weight. EXAM: CT PELVIS WITHOUT CONTRAST TECHNIQUE: Multidetector CT imaging of the pelvis was performed following the standard protocol without intravenous contrast. COMPARISON:  Abdominopelvic CT 08/16/2017 FINDINGS: Urinary Tract: Urinary bladder is distended without wall thickening. Distal ureters are nondistended. Bowel: Enteric sutures in the transverse colon. Moderate stool in the sigmoid colon. No colonic inflammation.  Vascular/Lymphatic: Aorta bi-iliac atherosclerosis.  No adenopathy. Reproductive:  Atrophic uterus.  No adnexal mass. Other:  No pelvic free fluid. Musculoskeletal: Findings suspicious for nondisplaced left sacral fracture with cortical buckling. Sacroiliac joints are congruent. Chondrocalcinosis of the pubic symphysis. No additional fracture of the pelvis or hips. Pubic rami are intact. Bones diffusely under mineralized. IMPRESSION: Findings suspicious for nondisplaced left sacral fracture. Electronically Signed   By: Jeb Levering M.D.   On: 09/13/2017 05:03   Dg Hip Unilat With Pelvis 2-3 Views Left  Result Date: 09/13/2017 CLINICAL DATA:  Left hip pain after a fall. EXAM: DG  HIP (WITH OR WITHOUT PELVIS) 2-3V LEFT COMPARISON:  Pelvis x-ray 09/05/2016 FINDINGS: No evidence of fracture. SI joints and symphysis pubis unremarkable. Degenerative changes are noted in the right hip. AP and frog-leg lateral views of the left hip show no femoral neck fracture. IMPRESSION: Negative. Electronically Signed   By: Misty Stanley M.D.   On: 09/13/2017 02:23    ED COURSE and MDM  Nursing notes and initial vitals signs, including pulse oximetry, reviewed.  Vitals:   09/13/17 0207  BP: (!) 165/76  Pulse: 90  Resp: 17  Temp: 97.9 F (36.6 C)  TempSrc: Oral  SpO2: 98%   5:20 AM Patient unable to bear weight.  She lives alone and her daughter lives in [place].  We will contact her daughter and arrange for social work consult later this morning.  PROCEDURES    ED DIAGNOSES     ICD-10-CM   1. Fall in home, initial encounter W19.XXXA    Y92.009   2. Fracture of sacrum without disruption of pelvic ring (HCC) S32.10XA        Kaydn Kumpf, [name], MD 09/13/17 [phone]

## 2017-09-13 NOTE — ED Notes (Signed)
Per MD, pt is allowed to eat and drink.

## 2017-09-13 NOTE — ED Notes (Addendum)
[name], son  [phone].  Patient will attempt to come tonight or tomorrow to get mom home.   Business: [phone]

## 2017-09-13 NOTE — ED Triage Notes (Addendum)
Patient arrives by [facility] with complaints of fall. Patient was trying to turn around and fell onto her buttocks and crawled to the door. Patient had her cell phone in her hand and called 911. Patient has left hip pain/lower back pain and pelvic pain. Per EMS, patient has no shortening or rotation.

## 2017-09-13 NOTE — ED Notes (Signed)
Attempted to call and give report, nurse will call back once she is able to accept report.

## 2017-09-13 NOTE — Progress Notes (Signed)
New Admission Note:    Arrival Method:Stretcher from ER Mental Orientation: Drowsy Assessment: In progress Skin: Intact IV: N/A Pain: N/A Safety Measures: Side Rales up x 2, call bell in reach Admission: Unable to admit pt drowsy 5E Orientation:Pt  Is droswy but call bell in reach and floor rules explained Family: No family at bedside  Orders have been reviewed and implemented.  Will continue to monitor the patient.

## 2017-09-13 NOTE — ED Notes (Signed)
Bed: FS23 Expected date:  Expected time:  Means of arrival:  Comments: Psych

## 2017-09-13 NOTE — Progress Notes (Addendum)
CSW met with patient at bedside to complete assessment.  Patient presents to [facility] from home, lives alone. CSW consulted for possible SNF placement. Patient fell and is experiencing pain as a result of the fall.   Patient lives alone in her own home in [place] and has hired help come by regularly to assist with grocery shopping and transportation to appointments. Patient has three adult children, none of whom live in the area.  Patient had colon surgery on July 11th and went to [facility] SNF following surgery. Patient reportedly had a negative experience with this SNF but is receptive to going to another SNF.  Patient currently has home health, through [facility]. Home Health comes twice each week. Patient does not have PT/OT.    PASSR: [phone] [place], [place] Social Worker [phone]

## 2017-09-13 NOTE — ED Provider Notes (Signed)
Care was taken over from Dr. Florina Ou.  Patient had a mechanical fall last night and has a sacral fracture.  She is able to stand up but is unable to take any steps unassisted.  Given this, I felt that it would be better to admit her to the hospital for observation and PT.  I spoke with Dr. Florene Glen who has agreed to admit the patient.  I have also ordered a social work and case management consult.  The nursing staff is attempting to get a hold of the patient's son.  He lives in [place].   Malvin Johns, MD 09/13/17 1021

## 2017-09-13 NOTE — ED Notes (Signed)
Bed: VU13 Expected date:  Expected time:  Means of arrival:  Comments: EMS 84 yo female left hip/back/pelvic pain

## 2017-09-14 ENCOUNTER — Other Ambulatory Visit: Payer: Self-pay

## 2017-09-14 DIAGNOSIS — S3210XA Unspecified fracture of sacrum, initial encounter for closed fracture: Secondary | ICD-10-CM

## 2017-09-14 LAB — COMPREHENSIVE METABOLIC PANEL
ALBUMIN: 2.7 g/dL — AB (ref 3.5–5.0)
ALK PHOS: 40 U/L (ref 38–126)
ALT: 22 U/L (ref 0–44)
AST: 28 U/L (ref 15–41)
Anion gap: 9 (ref 5–15)
BUN: 26 mg/dL — AB (ref 8–23)
CALCIUM: 8.6 mg/dL — AB (ref 8.9–10.3)
CO2: 27 mmol/L (ref 22–32)
CREATININE: 0.96 mg/dL (ref 0.44–1.00)
Chloride: 100 mmol/L (ref 98–111)
GFR calc Af Amer: 56 mL/min — ABNORMAL LOW (ref >60)
GFR calc non Af Amer: 49 mL/min — ABNORMAL LOW (ref >60)
GLUCOSE: 92 mg/dL (ref 70–99)
Potassium: 4.1 mmol/L (ref 3.5–5.1)
SODIUM: 136 mmol/L (ref 135–145)
Total Bilirubin: 0.5 mg/dL (ref 0.3–1.2)
Total Protein: 6.8 g/dL (ref 6.5–8.1)

## 2017-09-14 LAB — CBC
HCT: 31.6 % — ABNORMAL LOW (ref 36.0–46.0)
Hemoglobin: 9.8 g/dL — ABNORMAL LOW (ref 12.0–15.0)
MCH: 27.3 pg (ref 26.0–34.0)
MCHC: 31 g/dL (ref 30.0–36.0)
MCV: 88 fL (ref 78.0–100.0)
PLATELETS: 222 10*3/uL (ref 150–400)
RBC: 3.59 MIL/uL — ABNORMAL LOW (ref 3.87–5.11)
RDW: 15.8 % — ABNORMAL HIGH (ref 11.5–15.5)
WBC: 8.1 10*3/uL (ref 4.0–10.5)

## 2017-09-14 LAB — MAGNESIUM: MAGNESIUM: 2.1 mg/dL (ref 1.7–2.4)

## 2017-09-14 LAB — GLUCOSE, CAPILLARY
GLUCOSE-CAPILLARY: 136 mg/dL — AB (ref 70–99)
Glucose-Capillary: 101 mg/dL — ABNORMAL HIGH (ref 70–99)
Glucose-Capillary: 127 mg/dL — ABNORMAL HIGH (ref 70–99)

## 2017-09-14 MED ORDER — TUBERCULIN PPD 5 UNIT/0.1ML ID SOLN
5.0000 [IU] | Freq: Once | INTRADERMAL | Status: DC
  Administered 2017-09-14: 5 [IU] via INTRADERMAL
  Filled 2017-09-14: qty 0.1

## 2017-09-14 MED ORDER — POLYETHYLENE GLYCOL 3350 17 G PO PACK: 17.0000 g | PACK | Freq: Every day | ORAL | 0 refills | Status: AC

## 2017-09-14 MED ORDER — SENNOSIDES-DOCUSATE SODIUM 8.6-50 MG PO TABS: 2.0000 | ORAL_TABLET | Freq: Every day | ORAL | 1 refills | Status: DC

## 2017-09-14 MED ORDER — OXYCODONE HCL 5 MG PO TABS: 2.5000 mg | ORAL_TABLET | Freq: Four times a day (QID) | ORAL | 0 refills | Status: DC | PRN

## 2017-09-14 MED ORDER — CALCIUM CARBONATE ANTACID 500 MG PO CHEW: 1.0000 | CHEWABLE_TABLET | Freq: Every day | ORAL | 5 refills | Status: AC

## 2017-09-14 MED ORDER — ACETAMINOPHEN 325 MG PO TABS: 650.0000 mg | ORAL_TABLET | Freq: Four times a day (QID) | ORAL | 2 refills | Status: AC | PRN

## 2017-09-14 MED ORDER — MIRTAZAPINE 7.5 MG PO TABS: 7.5000 mg | ORAL_TABLET | Freq: Every day | ORAL | 3 refills | Status: DC

## 2017-09-14 MED ORDER — ATORVASTATIN CALCIUM 20 MG PO TABS: 20.0000 mg | ORAL_TABLET | Freq: Every day | ORAL | 2 refills | Status: AC

## 2017-09-14 MED ORDER — ASPIRIN EC 81 MG PO TBEC: 81.0000 mg | DELAYED_RELEASE_TABLET | Freq: Every day | ORAL | 3 refills | Status: AC

## 2017-09-14 MED ORDER — METOPROLOL TARTRATE 25 MG PO TABS: 12.5000 mg | ORAL_TABLET | Freq: Two times a day (BID) | ORAL | 2 refills | Status: DC

## 2017-09-14 MED ORDER — PANTOPRAZOLE SODIUM 20 MG PO TBEC: 20.0000 mg | DELAYED_RELEASE_TABLET | Freq: Every day | ORAL | 2 refills | Status: AC

## 2017-09-14 MED ORDER — METHOCARBAMOL 500 MG PO TABS: 500.0000 mg | ORAL_TABLET | Freq: Two times a day (BID) | ORAL | 2 refills | Status: DC

## 2017-09-14 NOTE — Progress Notes (Addendum)
LCSW following for placement.   LCSW spoke with patient's son, [name]. [name] is going to take patient home to care for her there. He plans to take her back to [facility] with him to transition to ALF eventually.   Wille Glaser is requesting a TB test prior to patient dc'ing. LCSW notified attending.   LCSW signing off. No LCSW needs at this time.   Carolin Coy [place]

## 2017-09-14 NOTE — Evaluation (Signed)
Physical Therapy Evaluation Patient Details Name: [name] MRN: [id number] DOB: 08-10-1920 Today's Date: 09/14/2017   History of Present Illness  [name] is a 84 y.o. female with recent right hemicolectomy for adenocarcinoma of colon on July 11th followed by a short term SNF rehab at [facility] prior to DC home. Also with a medical history significant of CAD, T2DM,TIA, HTN, HLD presenting after a fall, unable to ambulate in the ED. Xrays revealed a non-displaced sacral fracture. She notes she was getting up to go to get a glass of water, she turned and lost her balance and fell straight down.    Clinical Impression  Pt very independent at home and has drive to continue to do so safely. Pt will very little pain, however BPS were low and pt with lightheadedness with mobility. Was able to transfer to [facility] with less than minA , and walk short distance with RW due to lightheadedness. Still general weakness is present and at this time would recommend ST-SNF to continue to montior and increase strength unless family is able to provide that in a home setting for this transitional time.     Follow Up Recommendations SNF(due to home alone unless family able to be with patient )    Equipment Recommendations  None recommended by PT    Recommendations for Other Services       Precautions / Restrictions Precautions Precautions: Fall Restrictions Weight Bearing Restrictions: No      Mobility  Bed Mobility Overal bed mobility: Needs Assistance Bed Mobility: Supine to Sit;Sit to Supine     Supine to sit: HOB elevated;Min assist Sit to supine: Min assist   General bed mobility comments: assist with scooting and shifting hips due to the pain and anticipation of pain due to first time up. I feel this may improve quickly   Transfers Overall transfer level: Needs assistance Equipment used: Rolling walker (2 wheeled) Transfers: Sit to/from Stand Sit to Stand: Min assist          General transfer comment: min assist to rise from commode using bar. Very little minimal assist.   Ambulation/Gait Ambulation/Gait assistance: Min assist Gait Distance (Feet): 10 Feet(limited due to patient feeling a little "weird" light headed  (BPs were low) ) Assistive device: Rolling walker (2 wheeled)(however did not rely on RW much at all!! ) Gait Pattern/deviations: Step-through pattern;Decreased stride length;Trunk flexed        Stairs            Wheelchair Mobility    Modified Rankin (Stroke Patients Only)       Balance Overall balance assessment: Needs assistance Sitting-balance support: Feet supported Sitting balance-Leahy Scale: Normal     Standing balance support: Bilateral upper extremity supported;During functional activity Standing balance-Leahy Scale: Fair                               Pertinent Vitals/Pain Faces Pain Scale: Hurts a little bit(pt really had very little pain with standing, pivoting and walking . Some with shifting weigh in sitting in Left posterios sacral area) Pain Location: Left posterior sacral/ gluteal area. however whennot moving patient is 0/10  Pain Descriptors / Indicators: Grimacing Pain Intervention(s): Monitored during session    Home Living Family/patient expects to be discharged to:: Private residence Living Arrangements: Alone Available Help at Discharge: Friend(s);Available PRN/intermittently Type of Home: House Home Access: Level entry;Other (comment)(however patients also states 2 steps in front to  enter? )     Home Layout: Able to live on main level with bedroom/bathroom;Multi-level Home Equipment: Gilford Rile - 2 wheels;Cane - single point      Prior Function Level of Independence: Independent         Comments: SPC only briefly or old side . Pt states " I try to use the RW but it gets in my way"      Hand Dominance        Extremity/Trunk Assessment        Lower Extremity  Assessment Lower Extremity Assessment: Generalized weakness    Cervical / Trunk Assessment Cervical / Trunk Assessment: Kyphotic  Communication   Communication: No difficulties  Cognition Arousal/Alertness: Awake/alert Behavior During Therapy: WFL for tasks assessed/performed Overall Cognitive Status: Within Functional Limits for tasks assessed                                        General Comments      Exercises     Assessment/Plan    PT Assessment    PT Problem List Decreased activity tolerance;Decreased balance;Decreased strength;Decreased mobility       PT Treatment Interventions Gait training;Therapeutic activities;Functional mobility training;Therapeutic exercise;Patient/family education;Stair training;Balance training    PT Goals (Current goals can be found in the Care Plan section)  Acute Rehab PT Goals Patient Stated Goal: I want to get stronger and go home. I can't believe I feel.  PT Goal Formulation: With patient Time For Goal Achievement: 09/28/17 Potential to Achieve Goals: Good    Frequency Min 3X/week   Barriers to discharge        Co-evaluation               AM-PAC PT "6 Clicks" Daily Activity  Outcome Measure Difficulty turning over in bed (including adjusting bedclothes, sheets and blankets)?: A Little Difficulty moving from lying on back to sitting on the side of the bed? : A Little Difficulty sitting down on and standing up from a chair with arms (e.g., wheelchair, bedside commode, etc,.)?: A Little Help needed moving to and from a bed to chair (including a wheelchair)?: A Little Help needed walking in hospital room?: A Little Help needed climbing 3-5 steps with a railing? : A Little 6 Click Score: 18    End of Session Equipment Utilized During Treatment: Gait belt Activity Tolerance: Patient tolerated treatment well Patient left: in chair;with call bell/phone within reach;with chair alarm set Nurse Communication:  Mobility status(removed purewick due to patient able to mobilize to [facility] with assistance ) PT Visit Diagnosis: Unsteadiness on feet (R26.81);Repeated falls (R29.6);Muscle weakness (generalized) (M62.81)    Time: 0737-1062 PT Time Calculation (min) (ACUTE ONLY): 57 min   Charges:   PT Evaluation $PT Eval Low Complexity: 1 Low PT Treatments $Gait Training: 8-22 mins $Therapeutic Activity: 23-37 mins        Clide Dales, PT Pager: [phone] 09/14/2017   Ahtziry Saathoff, Gatha Mayer 09/14/2017, 11:32 AM

## 2017-09-14 NOTE — Progress Notes (Signed)
[name] to be D/C'd Home per MD order.  Discussed prescriptions and follow up appointments with the patient. Prescriptions given to patient, medication list explained in detail. Pt verbalized understanding.  Allergies as of 09/14/2017      Reactions   Sulfamethoxazole-trimethoprim    unknown   Sulfonamide Derivatives    REACTION: rash   Nitrofurantoin    Unknown. Macrobid.       Medication List    STOP taking these medications   docusate sodium 100 MG capsule Commonly known as:  COLACE     TAKE these medications   acetaminophen 325 MG tablet Commonly known as:  TYLENOL Take 2 tablets (650 mg total) by mouth every 6 (six) hours as needed for mild pain.   aspirin EC 81 MG tablet Take 1 tablet (81 mg total) by mouth daily. With food What changed:  additional instructions   atorvastatin 20 MG tablet Commonly known as:  LIPITOR Take 1 tablet (20 mg total) by mouth daily at 6 PM. What changed:  See the new instructions.   calcium carbonate 200 MG capsule Take 200 mg by mouth daily.   calcium carbonate 500 MG chewable tablet Commonly known as:  TUMS - dosed in mg elemental calcium Chew 1 tablet (200 mg of elemental calcium total) by mouth daily. Start taking on:  09/15/2017   fish oil-omega-3 fatty acids 1000 MG capsule Take 1 capsule by mouth daily.   gabapentin 100 MG capsule Commonly known as:  NEURONTIN Take 1 capsule (100 mg total) by mouth at bedtime. Toe pain   glucose blood test strip Use once daily   methocarbamol 500 MG tablet Commonly known as:  ROBAXIN Take 1 tablet (500 mg total) by mouth 2 (two) times daily.   metoprolol tartrate 25 MG tablet Commonly known as:  LOPRESSOR Take 0.5 tablets (12.5 mg total) by mouth 2 (two) times daily. What changed:  See the new instructions.   mirtazapine 7.5 MG tablet Commonly known as:  REMERON Take 1 tablet (7.5 mg total) by mouth at bedtime.   multivitamin with minerals tablet Take 1 tablet by mouth  daily.   ONE TOUCH LANCETS Misc Use daily as directed   oxyCODONE 5 MG immediate release tablet Commonly known as:  Oxy IR/ROXICODONE Take 0.5 tablets (2.5 mg total) by mouth every 6 (six) hours as needed for moderate pain or severe pain.   pantoprazole 20 MG tablet Commonly known as:  PROTONIX Take 1 tablet (20 mg total) by mouth daily. What changed:  when to take this   polyethylene glycol packet Commonly known as:  MIRALAX / GLYCOLAX Take 17 g by mouth daily. What changed:    when to take this  reasons to take this   senna-docusate 8.6-50 MG tablet Commonly known as:  Senokot-S Take 2 tablets by mouth at bedtime.   VITAMIN B 12 PO Take 1 tablet by mouth daily.   VITAMIN C PO Take 1 tablet by mouth daily.   VITAMIN D PO Take 1 tablet by mouth daily.   VITAMIN E PO Take 1 tablet by mouth daily.       Vitals:   09/14/17 1147 09/14/17 1341  BP:  (!) 123/59  Pulse:  86  Resp:  16  Temp:  98.2 F (36.8 C)  SpO2: 98% 93%    Skin clean, dry and intact without evidence of skin break down, no evidence of skin tears noted. IV catheter discontinued intact. Site without signs and symptoms of complications.  Dressing and pressure applied. Pt denies pain at this time. No complaints noted.  An After Visit Summary was printed and given to the patient. Patient escorted via [place], and D/C home via private auto.  Haywood Lasso BSN, RN [employer] Phone [phone]

## 2017-09-14 NOTE — Progress Notes (Signed)
PT Note  Patient Details Name: [name] MRN: [id number] DOB: 1921-01-20     PT eval performed this morning. Full write up to follow. Pt was able to move fairly well in the room with MinA assistance to bedside commode and walked about 15 feet before feeling light headed, slow and BPs were a little low 115/59 with sitting and 113/49 with standing.   At this time would not be safe to DC home alone, however may progress. At this time recommend ST-SNF unless family can be with pt 24/7 for a while to assess how she is performing in the home alone and recovering.    Clide Dales 09/14/2017, 10:35 AM  Clide Dales, PT Pager: [phone] 09/14/2017

## 2017-09-14 NOTE — Discharge Instructions (Signed)
1)Please do not get up by yourself to avoid falling, always call for help 2)You will need therapy over the next few weeks to help you walk better, be  more steady and being less likely to fall 3)Please have your PPD skin  Test on the [place] ([place]) side of your left forearm checked in 48 to 72 hours which would be 09/16/2017 or 09/17/2017...Marland Kitchen A registered nurse order healthcare provider can help you check this whole you may go to public health to have it checked

## 2017-09-14 NOTE — Discharge Summary (Signed)
[name], is a 84 y.o. female  DOB January 01, 1921  MRN [id number].  Admission date:  09/13/2017  Admitting Physician  A Melven Sartorius., MD  Discharge Date:  09/14/2017   Primary MD  Eulas Post, MD  Recommendations for primary care physician for things to follow:   1)Please do not get up by yourself to avoid falling, always call for help 2)You will need therapy over the next few weeks to help you walk better, be  more steady and being less likely to fall 3)Please have your PPD skin  Test on the Volar ([place]) side of your left forearm checked in 48 to 72 hours which would be 09/16/2017 or 09/17/2017...Marland Kitchen A registered nurse order healthcare provider can help you check this whole you may go to public health to have it checked   Admission Diagnosis  Back pain [M54.9] Fall in home, initial encounter [W19.XXXA, Y92.009] Fracture of sacrum without disruption of pelvic ring ([place]) [S32.10XA]   Discharge Diagnosis  Back pain [M54.9] Fall in home, initial encounter [W19.XXXA, Y92.009] Fracture of sacrum without disruption of pelvic ring ([place]) [S32.10XA]    Principal Problem:   Sacral fracture (HCC) Active Problems:   Type 2 diabetes mellitus, controlled (HCC)   CAD (coronary artery disease)   PAF (paroxysmal atrial fibrillation) ([place])   Fall      Past Medical History:  Diagnosis Date  . ANEMIA, NORMOCYTIC 11/30/2009  . CAD (coronary artery disease) 04/27/2008   Non-Obstructive >> Cardiac catheterization 6/08: EF 55%, 1+ MR, Dx 50%, LAD 50%, dLAD 40%, OM1 50-60% (up to 70%), pOM 30%, pRCA 40%, mRCA 50-60%, pPDA 50-60% (up to 70%).  Medical therapy was recommended // MV 11/14: Normal stress nuclear study. LV Ejection Fraction: 67%.   . Carotid artery disease ([place])    Left CEA in 10/12.  Carotid dopplers (10/15) with 1-39% bilateral ICA stenosis. // Carotid US 10/17: R < 40; patent L CEA site  .  COLONIC POLYPS, ADENOMATOUS 12/26/2009  . DIABETES MELLITUS, TYPE II 08/11/2006  . DIVERTICULOSIS, COLON 08/11/2006  . Fall 09/06/2016  . GERD 02/16/2007  . Hemorrhoids    s/p banding  . History of echocardiogram    Echo 5/18: EF 60-65, no RWMA, Gr 2 DD, mild-mod MR, mod LAE // Echo 2/17: Mild LVH, mild focal basal septal hypertrophy, EF 55-60%, no RWMA, Gr 1 DD, MAC, mild MR, mild  // Echo 4/14: EF 60-65%, normal wall motion, Gr 1 DD, MAC, mild MR, PASP 32, reduced excursion of AV noncoronary cusp.    Marland Kitchen HYPERLIPIDEMIA 09/09/2006  . HYPERTENSION 09/09/2006  . LEFT BUNDLE BRANCH BLOCK 08/11/2006  . Open fracture of nasal bones   . OSTEOARTHRITIS 08/11/2006  . OSTEOPOROSIS 08/11/2006  . PAF (paroxysmal atrial fibrillation) ([place]) 04/12/2009   Paroxysmal, only prior documented episode was years ago.  She was not started on anticoagulation back then due to hemorrhoidal bleeding, apparently profuse.  Event monitor (3/14) showed only NSR.    Marland Kitchen Stroke ([place]) 12-06-13  . Syncope  8/15. EEG (5/15) was unremarkable. // 4/18: ILR neg for arrhythmia - ? orthostatic (no prodrome reported) >> c/b SAH (small)   . TIA 09/02/2006   Echocardiogram 6/12: Mild to moderate MR, trace AI, trace TR, PASP 34, bubble study negative for intracardiac shunting, normal EF (greater than 55%) //  Possible TIA in 10/15 with visual field cut.  MRI (10/15) with no CVA. Linq monitor placed.    Past Surgical History:  Procedure Laterality Date  . BIOPSY  08/20/2017   Procedure: BIOPSY;  Surgeon: Mauri Pole, MD;  Location: [facility];  Service: Endoscopy;;  . CAROTID ENDARTERECTOMY Left 12-05-2010  . CLOSED REDUCTION METATARSAL FRACTURE     right 5th  . COLONOSCOPY W/ POLYPECTOMY  2001  . COLONOSCOPY WITH PROPOFOL N/A 08/20/2017   Procedure: COLONOSCOPY WITH PROPOFOL;  Surgeon: Mauri Pole, MD;  Location: [facility];  Service: Endoscopy;  Laterality: N/A;  . EYE SURGERY     catarac  . LAPAROSCOPIC PARTIAL  COLECTOMY Right 08/21/2017   Procedure: LAPAROSCOPIC RIGHT COLECTOMY;  Surgeon: Johnathan Hausen, MD;  Location: [facility] ORS;  Service: General;  Laterality: Right;  . LOOP RECORDER IMPLANT N/A 01/03/2014   Procedure: LOOP RECORDER IMPLANT;  Surgeon: Evans Lance, MD;  Location: [facility] CATH LAB;  Service: Cardiovascular;  Laterality: N/A;  . SCHLEROTHERAPY  08/20/2017   Procedure: Woodward Ku;  Surgeon: Mauri Pole, MD;  Location: [facility];  Service: Endoscopy;;  spot tattoo  . TONSILLECTOMY         HPI  from the history and physical done on the day of admission:    Chief Complaint: fall  HPI: [name] is a 84 y.o. female with medical history significant of CAD, T2DM, recent right hemicolectomy for adenocarcinoma of colon, TIA, HTN, HLD presenting after a fall, unable to ambulate in the ED.  She notes she was getting up to go to get a glass of water, she turned and lost her balance and fell straight down.  She denies any head trauma, LH, loss of consciousness.  She alerted EMS and had to crawl to front door to let them in.  She denies any pain at rest, but notes pain with movement.  She denies numbness, tingling, weakness, CP, SOB, abdominal pain, or nausea, vomiting.    ED Course: Labs, imaging.  Notable for possible nondisplaced sacral fracture.  Pt unable to ambulate, hopsitalist requested to admit.     Hospital Course:     Fall  Nondisplaced Left Sacral Fracture:  Pt with mechanical fall, lost her balance and fell straight onto her bottom.  No head trauma or LOC.  She's unable to ambulate in ED due to pain.   Imaging with nondisplaced L sacral fracture, Chronic L2 compression fracture Physical therapy evaluation appreciated, recommended either SNF placement or discharge home with 24/7 supervision and home health.... Spoke with patient and, son and daughter-in-law, initially patient's son and daughter-in-law wanted patient placed to skilled nursing facility for rehab,  patient's son called back later to say that he would pick patient up and stay with patient so that patient will have 24/7 supervision until patient is well enough to go to assisted living facility somewhere around [facility].  PPD tuberculin test placed in left forearm to help facilitate placement to assisted living facility in the near future Methocarbamol, oxycodone and Tylenol as prescribed, home health physical therapy requested   Adenocarinoma of the colon: S/p R hemicolectomy on 08/21/17.  Wound is healing well. Looks like plan  was for outpatient follow up per previous d/c summary (pt notes her appointment is on Tuesday - this may need to be rescheduled)  T2DM: diet controlled, SSI.  Neurontin for peripheral neuropathy.   CAD  Hx TIA: continue ASA, statin.  Looks like plavix was d/c'd at previous hospitalization.  HFpEF: appears euvolemic  Hx Afib: rate controlled, continue metoprolol 2.5 mg twice daily and aspirin as prescribed, not on anticoagulation  Anemia: chronic, stable, follow   Discharge Condition: stable  Follow UP--- surgery and GI as previously scheduled  Disposition- Spoke with patient and, son and daughter-in-law, initially patient's son and daughter-in-law wanted patient placed to skilled nursing facility for rehab, patient's son called back later to say that he would pick patient up and stay with patient so that patient will have 24/7 supervision until patient is well enough to go to assisted living facility somewhere around [facility].  PPD tuberculin test placed in left forearm to help facilitate placement to assisted living facility in the near future  Diet and Activity recommendation:  As advised  Discharge Instructions    Discharge Instructions    Call MD for:  difficulty breathing, headache or visual disturbances   Complete by:  As directed    Call MD for:  persistant dizziness or light-headedness   Complete by:  As directed    Call MD for:   persistant nausea and vomiting   Complete by:  As directed    Call MD for:  severe uncontrolled pain   Complete by:  As directed    Call MD for:  temperature >100.4   Complete by:  As directed    Diet - low sodium heart healthy   Complete by:  As directed    Discharge instructions   Complete by:  As directed    1)Please do not get up by yourself to avoid falling, always call for help 2)You will need therapy over the next few weeks to help you walk better, be  more steady and being less likely to fall 3)Please have your PPD skin  Test on the [place] ([place]) side of your left forearm checked in 48 to 72 hours which would be 09/16/2017 or 09/17/2017...Marland Kitchen A registered nurse order healthcare provider can help you check this whole you may go to public health to have it checked   Increase activity slowly   Complete by:  As directed         Discharge Medications     Allergies as of 09/14/2017      Reactions   Sulfamethoxazole-trimethoprim    unknown   Sulfonamide Derivatives    REACTION: rash   Nitrofurantoin    Unknown. Macrobid.       Medication List    STOP taking these medications   docusate sodium 100 MG capsule Commonly known as:  COLACE     TAKE these medications   acetaminophen 325 MG tablet Commonly known as:  TYLENOL Take 2 tablets (650 mg total) by mouth every 6 (six) hours as needed for mild pain.   aspirin EC 81 MG tablet Take 1 tablet (81 mg total) by mouth daily. With food What changed:  additional instructions   atorvastatin 20 MG tablet Commonly known as:  LIPITOR Take 1 tablet (20 mg total) by mouth daily at 6 PM. What changed:  See the new instructions.   calcium carbonate 200 MG capsule Take 200 mg by mouth daily.   calcium carbonate 500 MG chewable tablet Commonly known as:  TUMS -  dosed in mg elemental calcium Chew 1 tablet (200 mg of elemental calcium total) by mouth daily. Start taking on:  09/15/2017   fish oil-omega-3 fatty acids 1000 MG  capsule Take 1 capsule by mouth daily.   gabapentin 100 MG capsule Commonly known as:  NEURONTIN Take 1 capsule (100 mg total) by mouth at bedtime. Toe pain   glucose blood test strip Use once daily   methocarbamol 500 MG tablet Commonly known as:  ROBAXIN Take 1 tablet (500 mg total) by mouth 2 (two) times daily.   metoprolol tartrate 25 MG tablet Commonly known as:  LOPRESSOR Take 0.5 tablets (12.5 mg total) by mouth 2 (two) times daily. What changed:  See the new instructions.   mirtazapine 7.5 MG tablet Commonly known as:  REMERON Take 1 tablet (7.5 mg total) by mouth at bedtime.   multivitamin with minerals tablet Take 1 tablet by mouth daily.   ONE TOUCH LANCETS Misc Use daily as directed   oxyCODONE 5 MG immediate release tablet Commonly known as:  Oxy IR/ROXICODONE Take 0.5 tablets (2.5 mg total) by mouth every 6 (six) hours as needed for moderate pain or severe pain.   pantoprazole 20 MG tablet Commonly known as:  PROTONIX Take 1 tablet (20 mg total) by mouth daily. What changed:  when to take this   polyethylene glycol packet Commonly known as:  MIRALAX / GLYCOLAX Take 17 g by mouth daily. What changed:    when to take this  reasons to take this   senna-docusate 8.6-50 MG tablet Commonly known as:  Senokot-S Take 2 tablets by mouth at bedtime.   VITAMIN B 12 PO Take 1 tablet by mouth daily.   VITAMIN C PO Take 1 tablet by mouth daily.   VITAMIN D PO Take 1 tablet by mouth daily.   VITAMIN E PO Take 1 tablet by mouth daily.       Major procedures and Radiology Reports - PLEASE review detailed and final reports for all details, in brief -   Dg Chest 2 View  Result Date: 08/20/2017 CLINICAL DATA:  Preoperative evaluation for colon surgery EXAM: CHEST - 2 VIEW COMPARISON:  09/06/2016 FINDINGS: Loop recorder projects over lower medial LEFT chest. Normal heart size, mediastinal contours, and pulmonary vascularity. Hyperinflation and bronchitic  changes question COPD. Biapical scarring. Diffuse interstitial prominence throughout both lungs similar to previous exam favoring pulmonary fibrosis. No definite acute infiltrate, pleural effusion or pneumothorax. Bones demineralized. IMPRESSION: Probable pulmonary fibrosis and question underlying COPD. No acute abnormalities. Electronically Signed   By: Lavonia Dana M.D.   On: 08/20/2017 16:11   Dg Thoracic Spine 2 View  Result Date: 09/13/2017 CLINICAL DATA:  Recent fall with known sacral fracture, initial encounter EXAM: THORACIC SPINE 2 VIEWS COMPARISON:  08/20/2017 FINDINGS: Stable L2 compression deformity is again noted. No new compression fractures are seen. Mild degenerative changes of the thoracic spine are noted. No paraspinal mass lesion is seen. IMPRESSION: Stable L2 compression deformity. No thoracic compression deformity is seen. Electronically Signed   By: Inez Catalina M.D.   On: 09/13/2017 11:16   Dg Lumbar Spine Complete  Result Date: 09/13/2017 CLINICAL DATA:  Lumbosacral and sacrococcygeal pain after fall. EXAM: LUMBAR SPINE - COMPLETE 4+ VIEW COMPARISON:  Reformats from abdominal CT 08/16/2017 FINDINGS: Unchanged L2 compression fracture with bony retropulsion, unchanged in appearance compared to CT. Mild focal kyphosis at this level unchanged. Remaining vertebral body heights are preserved. No acute fracture. The bones are under mineralized. Multilevel  facet arthropathy. Aortic atherosclerosis. IMPRESSION: 1. Chronic unchanged L2 compression fracture since CT 1 month ago. 2. No acute fracture of the lumbar spine. Electronically Signed   By: Jeb Levering M.D.   On: 09/13/2017 03:03   Dg Sacrum/coccyx  Result Date: 09/13/2017 CLINICAL DATA:  Lumbosacral and sacrococcygeal pain after fall. EXAM: SACRUM AND COCCYX - 2+ VIEW COMPARISON:  Reformats from abdominal CT 08/16/2017 FINDINGS: There is no evidence of fracture or other focal bone lesions. Similar sacrococcygeal junction to CT.  Sacral ala are maintained. Sacroiliac joints are congruent. Bones are under mineralized. IMPRESSION: No evidence of sacral fracture. Electronically Signed   By: Jeb Levering M.D.   On: 09/13/2017 03:04   Ct Pelvis Wo Contrast  Result Date: 09/13/2017 CLINICAL DATA:  Fall with pelvic pain, unable to bear weight. EXAM: CT PELVIS WITHOUT CONTRAST TECHNIQUE: Multidetector CT imaging of the pelvis was performed following the standard protocol without intravenous contrast. COMPARISON:  Abdominopelvic CT 08/16/2017 FINDINGS: Urinary Tract: Urinary bladder is distended without wall thickening. Distal ureters are nondistended. Bowel: Enteric sutures in the transverse colon. Moderate stool in the sigmoid colon. No colonic inflammation. Vascular/Lymphatic: Aorta bi-iliac atherosclerosis.  No adenopathy. Reproductive:  Atrophic uterus.  No adnexal mass. Other:  No pelvic free fluid. Musculoskeletal: Findings suspicious for nondisplaced left sacral fracture with cortical buckling. Sacroiliac joints are congruent. Chondrocalcinosis of the pubic symphysis. No additional fracture of the pelvis or hips. Pubic rami are intact. Bones diffusely under mineralized. IMPRESSION: Findings suspicious for nondisplaced left sacral fracture. Electronically Signed   By: Jeb Levering M.D.   On: 09/13/2017 05:03   Ct Abdomen Pelvis W Contrast  Result Date: 08/16/2017 CLINICAL DATA:  Anemia. GI bleed. Heme-positive stools. Intermittent right lower quadrant pain. EXAM: CT ABDOMEN AND PELVIS WITH CONTRAST TECHNIQUE: Multidetector CT imaging of the abdomen and pelvis was performed using the standard protocol following bolus administration of intravenous contrast. CONTRAST:  136m ISOVUE-300 IOPAMIDOL (ISOVUE-300) INJECTION 61% COMPARISON:  09/05/2016 CT pelvis. FINDINGS: Lower chest: Nonspecific patchy subpleural reticulation and ground-glass attenuation at both lung bases, not definitely changed since 03/16/2015. Coronary  atherosclerosis. Hepatobiliary: Normal liver size. No liver mass. Mild diffuse intrahepatic biliary ductal dilatation. Dilated common bile duct (10 mm diameter). Faintly calcified gallstones in the nondistended gallbladder with no definite gallbladder wall thickening or pericholecystic fluid. Pancreas: Normal, with no mass or duct dilation. Spleen: Normal size. No mass. Adrenals/Urinary Tract: No discrete adrenal nodules. Normal kidneys with no hydronephrosis and no renal mass. Normal bladder. Stomach/Bowel: Normal non-distended stomach. Normal caliber small bowel with no small bowel wall thickening. Normal appendix. There is a large annular colonic mass involving the entire ascending colon measuring 4.7 x 3.5 x 7.5 cm (series 2/image 36). There is associated haziness of the pericolonic fat in the ascending colon. Marked sigmoid diverticulosis, with no sigmoid wall thickening or pericolonic fat stranding. Vascular/Lymphatic: Atherosclerotic nonaneurysmal abdominal aorta. Patent portal, splenic, hepatic and renal veins. A few mildly prominent rounded nodes in lateral right ascending colonic mesentery measuring up to 7 mm (series 2/image 32). Conglomerate central mesenteric adenopathy up to 2.1 cm (series 2/image 37). Enlarged 1.7 cm porta hepatis node (series 2/image 19). No pelvic adenopathy. Reproductive: Grossly normal uterus.  No adnexal mass. Other: No pneumoperitoneum, ascites or focal fluid collection. Musculoskeletal: No aggressive appearing focal osseous lesions. Severe T2 vertebral compression fracture of uncertain chronicity with 8 mm bony retropulsion into the anterior lumbar spinal canal. Moderate lumbar spondylosis. IMPRESSION: 1. Large annular colonic mass involving the entire ascending  colon, highly suspicious for primary colonic malignancy. 2. Nonspecific pericolonic fat haziness associated with the ascending colonic mass, which could be due to tumor filtration or could be inflammatory due to wall  perforation. No free air or abscess. No bowel obstruction. 3. Central mesenteric and porta hepatis lymphadenopathy suspicious for nodal metastatic disease. 4. Cholelithiasis. No evidence of acute cholecystitis. Mild intrahepatic and extrahepatic biliary ductal dilatation with CBD diameter 10 mm. Recommend correlation with serum bilirubin levels. MRCP may be obtained to evaluate for choledocholithiasis, as clinically warranted. 5. Severe L2 vertebral compression fracture of uncertain chronicity. 6. Chronic findings include: Aortic Atherosclerosis (ICD10-I70.0). Coronary atherosclerosis. Marked sigmoid diverticulosis. Electronically Signed   By: Ilona Sorrel M.D.   On: 08/16/2017 17:23   Dg Chest Port 1 View  Result Date: 08/23/2017 CLINICAL DATA:  Productive cough. EXAM: PORTABLE CHEST 1 VIEW COMPARISON:  August 20, 2017 FINDINGS: There is a background of increased interstitial markings consistent with known interstitial lung disease. However, the interstitial opacity has increased in the interval. Mild opacities in the bases may be associated with small effusions. Mildly more focal opacity seen in the left mid lung as well. The heart size borderline. The hila and mediastinum are unremarkable. No nodules or masses. No focal infiltrates. IMPRESSION: 1. The findings are suggestive of pulmonary edema seen on the background of known interstitial lung disease. 2. Bibasilar opacities are likely associated with small effusions and may represent atelectasis versus infiltrate. 3. More focal opacity in the left mid lung could represent asymmetric edema versus underlying developing infiltrate. Electronically Signed   By: Dorise Bullion III M.D   On: 08/23/2017 07:04   Dg Hip Unilat With Pelvis 2-3 Views Left  Result Date: 09/13/2017 CLINICAL DATA:  Left hip pain after a fall. EXAM: DG HIP (WITH OR WITHOUT PELVIS) 2-3V LEFT COMPARISON:  Pelvis x-ray 09/05/2016 FINDINGS: No evidence of fracture. SI joints and symphysis  pubis unremarkable. Degenerative changes are noted in the right hip. AP and frog-leg lateral views of the left hip show no femoral neck fracture. IMPRESSION: Negative. Electronically Signed   By: Misty Stanley M.D.   On: 09/13/2017 02:23    Micro Results   No results found for this or any previous visit (from the past 240 hour(s)).     Today   Subjective    Despina Pole today has no new concerns, sacral area pain is better, no headaches no palpitations no dizziness no chest pains          Patient has been seen and examined prior to discharge   Objective   Blood pressure (!) 123/59, pulse 86, temperature 98.2 F (36.8 C), temperature source Oral, resp. rate 16, weight 50.5 kg (111 lb 5.3 oz), SpO2 93 %.  No intake or output data in the 24 hours ending 09/14/17 1701  Exam Gen:- Awake Alert,  In no apparent distress  HEENT:- [place].AT, No sclera icterus Neck-Supple Neck,No JVD,.  Lungs-  CTAB , good air movement CV- S1, S2 normal, irregular Abd-  +ve B.Sounds, Abd Soft, No tenderness,    Extremity/Skin:- No  edema,   Good pulses Psych-affect is appropriate, oriented x3 Neuro-no new focal deficits, no tremors MSK-sacral and lower lumbar area discomfort with palpation with point tenderness, discomfort with positional change and range of motion especially flexion of the right thigh  Data Review   CBC w Diff:  Lab Results  Component Value Date   WBC 8.1 09/14/2017   HGB 9.8 (L) 09/14/2017   HCT 31.6 (  L) 09/14/2017   PLT 222 09/14/2017   LYMPHOPCT 19 09/13/2017   MONOPCT 7 09/13/2017   EOSPCT 1 09/13/2017   BASOPCT 0 09/13/2017    CMP:  Lab Results  Component Value Date   NA 136 09/14/2017   K 4.1 09/14/2017   CL 100 09/14/2017   CO2 27 09/14/2017   BUN 26 (H) 09/14/2017   CREATININE 0.96 09/14/2017   CREATININE 0.90 (H) 08/13/2017   PROT 6.8 09/14/2017   ALBUMIN 2.7 (L) 09/14/2017   BILITOT 0.5 09/14/2017   ALKPHOS 40 09/14/2017   AST 28 09/14/2017   ALT 22  09/14/2017  .   Total Discharge time is about 33 minutes  Roxan Hockey M.D on 09/14/2017 at 5:01 PM   Go to [email or web address] - password [email or web address] for contact info  [facility] - Office  [phone]

## 2017-09-15 ENCOUNTER — Ambulatory Visit (INDEPENDENT_AMBULATORY_CARE_PROVIDER_SITE_OTHER): Payer: Medicare Other | Admitting: Family Medicine

## 2017-09-15 ENCOUNTER — Encounter: Payer: Self-pay | Admitting: Family Medicine

## 2017-09-15 VITALS — BP 102/64 | HR 75 | Temp 98.7°F | Wt 106.3 lb

## 2017-09-15 DIAGNOSIS — R296 Repeated falls: Secondary | ICD-10-CM | POA: Insufficient documentation

## 2017-09-15 DIAGNOSIS — R627 Adult failure to thrive: Secondary | ICD-10-CM | POA: Diagnosis not present

## 2017-09-15 DIAGNOSIS — R531 Weakness: Secondary | ICD-10-CM

## 2017-09-15 DIAGNOSIS — S3210XA Unspecified fracture of sacrum, initial encounter for closed fracture: Secondary | ICD-10-CM | POA: Diagnosis not present

## 2017-09-15 DIAGNOSIS — K56609 Unspecified intestinal obstruction, unspecified as to partial versus complete obstruction: Secondary | ICD-10-CM

## 2017-09-15 NOTE — Progress Notes (Signed)
Subjective:     Patient ID: [name], female   DOB: 11-03-1920, 84 y.o.   MRN: [id number]  HPI   Patient seen for hospital follow-up. She's had recent decline over past several months with decreasing appetite, loss of weight and decline in hemoglobin. She was admitted 08/16/17 with increasing weakness and had CT scan which showed large annular colon mass. She underwent right hemicolectomy on 7/11 and received 1 unit of packed red blood cells.   She was doing relatively well following her surgery until 09/12/17. She got up to get some water and apparently lost her balance and fell onto her sacrum region. No head trauma and no loss of consciousness.  Initially unable to ambulate in the ED secondary to pain. Imaging revealed nondisplaced left sacral fracture. Chronic L2 compression fracture. Recommendation was discharge to skilled nursing facility versus discharge home with 24/7 supervision.  Her son who is from [facility] is currently here caring for her. He had requested PPD placement in anticipation of hopefully transfer to assisted living facility in the near future.  Patient was prescribed Robaxin, oxycodone, and Senokot S. She has not filled those prescriptions yet.  Son states that she has been eating well since discharge. Abdominal wound is healing well. She's not had a stool now for a few days. She is not taking any oxycodone so far and he states that Tylenol seems to be controlling her pain fairly well at night. Still has increased fatigue and malaise. She has scheduled follow-up with oncology tomorrow  Past Medical History:  Diagnosis Date  . ANEMIA, NORMOCYTIC 11/30/2009  . CAD (coronary artery disease) 04/27/2008   Non-Obstructive >> Cardiac catheterization 6/08: EF 55%, 1+ MR, Dx 50%, LAD 50%, dLAD 40%, OM1 50-60% (up to 70%), pOM 30%, pRCA 40%, mRCA 50-60%, pPDA 50-60% (up to 70%).  Medical therapy was recommended // MV 11/14: Normal stress nuclear study. LV Ejection Fraction:  67%.   . Carotid artery disease ([place])    Left CEA in 10/12.  Carotid dopplers (10/15) with 1-39% bilateral ICA stenosis. // Carotid US 10/17: R < 40; patent L CEA site  . COLONIC POLYPS, ADENOMATOUS 12/26/2009  . DIABETES MELLITUS, TYPE II 08/11/2006  . DIVERTICULOSIS, COLON 08/11/2006  . Fall 09/06/2016  . GERD 02/16/2007  . Hemorrhoids    s/p banding  . History of echocardiogram    Echo 5/18: EF 60-65, no RWMA, Gr 2 DD, mild-mod MR, mod LAE // Echo 2/17: Mild LVH, mild focal basal septal hypertrophy, EF 55-60%, no RWMA, Gr 1 DD, MAC, mild MR, mild  // Echo 4/14: EF 60-65%, normal wall motion, Gr 1 DD, MAC, mild MR, PASP 32, reduced excursion of AV noncoronary cusp.    Marland Kitchen HYPERLIPIDEMIA 09/09/2006  . HYPERTENSION 09/09/2006  . LEFT BUNDLE BRANCH BLOCK 08/11/2006  . Open fracture of nasal bones   . OSTEOARTHRITIS 08/11/2006  . OSTEOPOROSIS 08/11/2006  . PAF (paroxysmal atrial fibrillation) ([place]) 04/12/2009   Paroxysmal, only prior documented episode was years ago.  She was not started on anticoagulation back then due to hemorrhoidal bleeding, apparently profuse.  Event monitor (3/14) showed only NSR.    Marland Kitchen Stroke ([place]) 12-06-13  . Syncope    8/15. EEG (5/15) was unremarkable. // 4/18: ILR neg for arrhythmia - ? orthostatic (no prodrome reported) >> c/b SAH (small)   . TIA 09/02/2006   Echocardiogram 6/12: Mild to moderate MR, trace AI, trace TR, PASP 34, bubble study negative for intracardiac shunting, normal EF (greater than  55%) //  Possible TIA in 10/15 with visual field cut.  MRI (10/15) with no CVA. Linq monitor placed.   Past Surgical History:  Procedure Laterality Date  . BIOPSY  08/20/2017   Procedure: BIOPSY;  Surgeon: Mauri Pole, MD;  [facility]: [facility];  Service: Endoscopy;;  . CAROTID ENDARTERECTOMY Left 12-05-2010  . CLOSED REDUCTION METATARSAL FRACTURE     right 5th  . COLONOSCOPY W/ POLYPECTOMY  2001  . COLONOSCOPY WITH PROPOFOL N/A 08/20/2017   Procedure: COLONOSCOPY  WITH PROPOFOL;  Surgeon: Mauri Pole, MD;  [facility];  Service: Endoscopy;  Laterality: N/A;  . EYE SURGERY     catarac  . LAPAROSCOPIC PARTIAL COLECTOMY Right 08/21/2017   Procedure: LAPAROSCOPIC RIGHT COLECTOMY;  Surgeon: Johnathan Hausen, MD;  Location: [facility];  Service: General;  Laterality: Right;  . LOOP RECORDER IMPLANT N/A 01/03/2014   Procedure: LOOP RECORDER IMPLANT;  Surgeon: Evans Lance, MD;  Location: [facility] CATH LAB;  Service: Cardiovascular;  Laterality: N/A;  . SCHLEROTHERAPY  08/20/2017   Procedure: Woodward Ku;  Surgeon: Mauri Pole, MD;  [facility];  Service: Endoscopy;;  spot tattoo  . TONSILLECTOMY      reports that she has never smoked. She has never used smokeless tobacco. She reports that she does not drink alcohol or use drugs. family history includes Breast cancer in her sister; Colon cancer in her brother; Congestive Heart Failure (age of onset: 54) in her father; Heart attack in her mother; Heart disease in her brother; Heart disease (age of onset: 69) in her mother; Kidney disease in her mother; Uterine cancer in her sister. Allergies  Allergen Reactions  . Sulfamethoxazole-Trimethoprim     unknown  . Sulfonamide Derivatives     REACTION: rash  . Nitrofurantoin     Unknown. Macrobid.      Review of Systems  Constitutional: Positive for appetite change and fatigue. Negative for chills and fever.  Cardiovascular: Negative for chest pain.  Gastrointestinal: Positive for constipation. Negative for abdominal pain, nausea and vomiting.  Genitourinary: Negative for dysuria.  Neurological: Negative for syncope.  Psychiatric/Behavioral: Negative for confusion.       Objective:   Physical Exam  Constitutional:  Thin somewhat debilitated appearing 84 year old female in no distress  Cardiovascular: Normal rate.  Pulmonary/Chest: Effort normal and breath sounds normal.  Abdominal: Soft. There is no tenderness.  Incision  healing well from recent hemicolectomy. Steri-Strips still in place  Musculoskeletal: She exhibits no edema.  Neurological: She is alert. No cranial nerve deficit.       Assessment:     Debilitated 84 year old female with recent hemicolectomy for adenocarcinoma for palliative reasons. Status post fall with sacral fracture as above. Very high risk for recurrent falls    Plan:     -Would try to avoid oxycodone and Robaxin as much as possible to avoid confusion and worsening constipation -Encouraged to use incentive spirometer frequently -discussion about disposition. Feel that she probably needs some skilled nursing rehabilitation before looking at any kind of assisted living. He is looking at placement options at this time. She has agreed to move to [facility] to be closer to son. -we initiated [facility] consult to assist with placement.    Eulas Post MD [facility] at [facility]

## 2017-09-15 NOTE — Patient Instructions (Signed)
Get back to using incentive spirometer several times daily  Follow up promptly for any fever or other concerns.  Get the Senokot prescription filled and start for constipation  Try to avoid the Oxycodone and Robaxin if possible.

## 2017-09-16 ENCOUNTER — Inpatient Hospital Stay: Payer: Medicare Other | Attending: Nurse Practitioner | Admitting: Oncology

## 2017-09-16 ENCOUNTER — Inpatient Hospital Stay: Payer: Medicare Other

## 2017-09-16 VITALS — BP 145/66 | HR 87 | Temp 97.5°F | Resp 17 | Ht 66.5 in | Wt 106.0 lb

## 2017-09-16 DIAGNOSIS — E119 Type 2 diabetes mellitus without complications: Secondary | ICD-10-CM

## 2017-09-16 DIAGNOSIS — I2510 Atherosclerotic heart disease of native coronary artery without angina pectoris: Secondary | ICD-10-CM

## 2017-09-16 DIAGNOSIS — Z79899 Other long term (current) drug therapy: Secondary | ICD-10-CM | POA: Diagnosis not present

## 2017-09-16 DIAGNOSIS — I10 Essential (primary) hypertension: Secondary | ICD-10-CM | POA: Insufficient documentation

## 2017-09-16 DIAGNOSIS — Z8673 Personal history of transient ischemic attack (TIA), and cerebral infarction without residual deficits: Secondary | ICD-10-CM | POA: Diagnosis not present

## 2017-09-16 DIAGNOSIS — K5900 Constipation, unspecified: Secondary | ICD-10-CM | POA: Diagnosis not present

## 2017-09-16 DIAGNOSIS — R5383 Other fatigue: Secondary | ICD-10-CM | POA: Insufficient documentation

## 2017-09-16 DIAGNOSIS — C182 Malignant neoplasm of ascending colon: Secondary | ICD-10-CM

## 2017-09-16 DIAGNOSIS — D649 Anemia, unspecified: Secondary | ICD-10-CM | POA: Diagnosis not present

## 2017-09-16 DIAGNOSIS — E785 Hyperlipidemia, unspecified: Secondary | ICD-10-CM | POA: Diagnosis not present

## 2017-09-16 DIAGNOSIS — I480 Paroxysmal atrial fibrillation: Secondary | ICD-10-CM

## 2017-09-16 DIAGNOSIS — K5730 Diverticulosis of large intestine without perforation or abscess without bleeding: Secondary | ICD-10-CM | POA: Diagnosis not present

## 2017-09-16 DIAGNOSIS — M810 Age-related osteoporosis without current pathological fracture: Secondary | ICD-10-CM | POA: Insufficient documentation

## 2017-09-16 DIAGNOSIS — M1990 Unspecified osteoarthritis, unspecified site: Secondary | ICD-10-CM | POA: Insufficient documentation

## 2017-09-16 DIAGNOSIS — R197 Diarrhea, unspecified: Secondary | ICD-10-CM | POA: Insufficient documentation

## 2017-09-16 DIAGNOSIS — R109 Unspecified abdominal pain: Secondary | ICD-10-CM | POA: Insufficient documentation

## 2017-09-16 DIAGNOSIS — K219 Gastro-esophageal reflux disease without esophagitis: Secondary | ICD-10-CM | POA: Diagnosis not present

## 2017-09-16 NOTE — Progress Notes (Signed)
[place] Patient Consult   Requesting MD: Julyanna Scholle 84 y.o.  [birth year]    Reason for consult: Colon cancer   HPI: Ms. Purdie reports constipation/diarrhea and abdominal pain for several months.  She developed profound fatigue and saw her primary physician.  She was noted to have severe anemia.  She was admitted for further evaluation.  A CT of the abdomen and pelvis on 08/16/2017 revealed a large annular colonic mass involving the ascending colon.  Haziness in the pericolonic fat.  Marked sigmoid diverticulosis.  Mildly prominent nodes were noted in the lateral colon mesentery and central mesentery.  A 1.7 cm porta hepatis node.  No pelvic adenopathy.  Mild intrahepatic and extra hepatic biliary ductal dilatation.  She was transfused with packed red blood cells.  She was referred to gastroenterology and was taken to a colonoscopy on 08/21/2017.  A partially obstructing mass was found in the ascending colon.  Oozing was present.  The mass was biopsied and the area was tattooed.  A polypoid medium-sized mass was found in the distal descending colon biopsies were taken and the area was tattooed.  Multiple diverticula were noted in the sigmoid colon. The pathology revealed invasive adenocarcinoma involving the ascending colon mass.  The biopsy from the left colon revealed a traditional serrated adenoma.  No high-grade dysplasia or malignancy.  Dr. Hassell Done was consulted.  She was taken to the operating room on 08/21/2017 for a laparoscopic-assisted right colectomy. The pathology [id number]) revealed a moderately differentiated invasive adenocarcinoma of the mid ascending colon.  There was an additional "small focus of adenocarcinoma "arising in a tubular adenoma measuring 0.7 cm and invading the submucosa only.  Tumor was noted to invade through the serosa into adherent omentum.  The resection margins are negative.  No lymphovascular or perineural invasion.  24  lymph nodes returned negative for metastatic carcinoma.  No macroscopic tumor perforation.  The tumor returned MSI-high with loss of MLH1 and PMS2 expression.  She was discharged to a skilled nursing facility on 08/26/2017.  She subsequently returned home.  She experienced a fall at home and was evaluated in the emergency room.  A CT of the pelvis on 09/13/2017 revealed a nondisplaced left sacral fracture.  Ms. Lapage is here today with her son.  She plans to relocate to [facility] with her son later today.  Past Medical History:  Diagnosis Date  . ANEMIA, NORMOCYTIC 11/30/2009  . CAD (coronary artery disease) 04/27/2008   Non-Obstructive >> Cardiac catheterization 6/08: EF 55%, 1+ MR, Dx 50%, LAD 50%, dLAD 40%, OM1 50-60% (up to 70%), pOM 30%, pRCA 40%, mRCA 50-60%, pPDA 50-60% (up to 70%).  Medical therapy was recommended // MV 11/14: Normal stress nuclear study. LV Ejection Fraction: 67%.   . Carotid artery disease ([place])    Left CEA in 10/12.  Carotid dopplers (10/15) with 1-39% bilateral ICA stenosis. // Carotid US 10/17: R < 40; patent L CEA site  . COLONIC POLYPS, ADENOMATOUS 12/26/2009  . DIABETES MELLITUS, TYPE II 08/11/2006  . DIVERTICULOSIS, COLON 08/11/2006  . Fall 09/06/2016  . GERD 02/16/2007  . Hemorrhoids    s/p banding  . History of echocardiogram    Echo 5/18: EF 60-65, no RWMA, Gr 2 DD, mild-mod MR, mod LAE // Echo 2/17: Mild LVH, mild focal basal septal hypertrophy, EF 55-60%, no RWMA, Gr 1 DD, MAC, mild MR, mild  // Echo 4/14: EF 60-65%, normal wall motion, Gr 1 DD, MAC,  mild MR, PASP 32, reduced excursion of AV noncoronary cusp.    Marland Kitchen HYPERLIPIDEMIA 09/09/2006  . HYPERTENSION 09/09/2006  . LEFT BUNDLE BRANCH BLOCK 08/11/2006  . Open fracture of nasal bones   . OSTEOARTHRITIS 08/11/2006  . OSTEOPOROSIS 08/11/2006  . PAF (paroxysmal atrial fibrillation) ([place]) 04/12/2009   Paroxysmal, only prior documented episode was years ago.  She was not started on anticoagulation back then  due to hemorrhoidal bleeding, apparently profuse.  Event monitor (3/14) showed only NSR.    Marland Kitchen Stroke ([place]) 12-06-13  . Syncope    8/15. EEG (5/15) was unremarkable. // 4/18: ILR neg for arrhythmia - ? orthostatic (no prodrome reported) >> c/b SAH (small)   . TIA 09/02/2006   Echocardiogram 6/12: Mild to moderate MR, trace AI, trace TR, PASP 34, bubble study negative for intracardiac shunting, normal EF (greater than 55%) //  Possible TIA in 10/15 with visual field cut.  MRI (10/15) with no CVA. Linq monitor placed.    .  Colon cancer, ascending colon, T4b, N0                                                                           08/21/2017   .  G4, P3, 1 miscarriage  Past Surgical History:  Procedure Laterality Date  . BIOPSY  08/20/2017   Procedure: BIOPSY;  Surgeon: Mauri Pole, MD;  [facility];  Service: Endoscopy;;  . CAROTID ENDARTERECTOMY Left 12-05-2010  . CLOSED REDUCTION METATARSAL FRACTURE     right 5th  . COLONOSCOPY W/ POLYPECTOMY  2001  . COLONOSCOPY WITH PROPOFOL N/A 08/20/2017   Procedure: COLONOSCOPY WITH PROPOFOL;  Surgeon: Mauri Pole, MD;  [facility];  Service: Endoscopy;  Laterality: N/A;  . EYE SURGERY     catarac  . LAPAROSCOPIC PARTIAL COLECTOMY Right 08/21/2017   Procedure: LAPAROSCOPIC RIGHT COLECTOMY;  Surgeon: Johnathan Hausen, MD;  [facility];  Service: General;  Laterality: Right;  . LOOP RECORDER IMPLANT N/A 01/03/2014   Procedure: LOOP RECORDER IMPLANT;  Surgeon: Evans Lance, MD;  Location: [facility] CATH LAB;  Service: Cardiovascular;  Laterality: N/A;  . SCHLEROTHERAPY  08/20/2017   Procedure: Woodward Ku;  Surgeon: Mauri Pole, MD;  [facility];  Service: Endoscopy;;  spot tattoo  . TONSILLECTOMY      Medications: Reviewed  Allergies:  Allergies  Allergen Reactions  . Sulfamethoxazole-Trimethoprim     unknown  . Sulfonamide Derivatives     REACTION: rash  . Nitrofurantoin      Unknown. Macrobid.     Family history: No family history of colorectal cancer.  A brother had "cancer ".  A sister had "cancer ".  A sister had breast cancer.  Social History:   She currently lives alone in [place].  She will relocate to [facility] with her son on 09/16/2017.  She does not smoke cigarettes.  She reports rare alcohol use.  No risk factor for hepatitis.  ROS:   Positives include: Constipation following colon cancer surgery, anorexia and 24 pound weight loss prior to surgery, fall 09/12/2017 with a sacral fracture  A complete ROS was otherwise negative.  Physical Exam:  Blood pressure (!) 145/66, pulse 87, temperature (!) 97.5 F (  36.4 C), temperature source Oral, resp. rate 17, height 5' 6.5" (1.689 m), weight 106 lb (48.1 kg), SpO2 95 %.  HEENT: Oropharynx without visible mass, neck without mass Lungs: Scattered inspiratory rhonchi, no respiratory distress Cardiac: Regular rate and rhythm Abdomen: No hepatosplenomegaly, healed surgical incisions, no mass, nontender  Vascular: No leg edema Lymph nodes: No cervical, supraclavicular, axillary, or inguinal nodes Neurologic: Alert, difficulty with memory recall, but oriented to place, year, and diagnosis Skin: Ecchymosis at the mid back, no rash Musculoskeletal: Tender at the lower back   LAB:  CBC  Lab Results  Component Value Date   WBC 8.1 09/14/2017   HGB 9.8 (L) 09/14/2017   HCT 31.6 (L) 09/14/2017   MCV 88.0 09/14/2017   PLT 222 09/14/2017   NEUTROABS 7.2 09/13/2017        CMP  Lab Results  Component Value Date   NA 136 09/14/2017   K 4.1 09/14/2017   CL 100 09/14/2017   CO2 27 09/14/2017   GLUCOSE 92 09/14/2017   BUN 26 (H) 09/14/2017   CREATININE 0.96 09/14/2017   CALCIUM 8.6 (L) 09/14/2017   PROT 6.8 09/14/2017   ALBUMIN 2.7 (L) 09/14/2017   AST 28 09/14/2017   ALT 22 09/14/2017   ALKPHOS 40 09/14/2017   BILITOT 0.5 09/14/2017   GFRNONAA 49 (L) 09/14/2017   GFRAA 56 (L) 09/14/2017       Lab Results  Component Value Date   CEA1 31.7 (H) 08/20/2017    Imaging:  As per HPI, CT images from 08/16/2017-reviewed   Assessment/Plan:   1. Adenocarcinoma of the ascending colon, stage IIc (T4b, N0), status post a right colectomy 08/21/2017  0/24 lymph nodes positive, no lymphovascular or perineural invasion  MSI-high with loss of MLH1 and PMS2 expression  Colonoscopy 08/20/2017- partially obstructing mass in the ascending colon, medium-sized mass in the distal descending colon (biopsy of the descending colon mass revealed a serrated adenoma)  CT abdomen and pelvis 08/16/2017-a sending colon mass, mildly prominent nodes in the right colon mesentery, central mesenteric and porta hepatis lymphadenopathy suspicious for metastatic disease, mild intrahepatic and extra hepatic biliary ductal dilatation  Elevated preoperative CEA 2. Additional small focus of adenocarcinoma arising in a tubular adenoma within the colon resection specimen 08/21/2017, invasion of the submucosa 3. History of colon polyps, serrated adenoma of the descending colon noted on colonoscopy 08/20/2017-not removed 4. Severe anemia 08/13/2017, likely secondary to bleeding from #1 5. Paroxysmal atrial fibrillation 6. Osteoporosis 7. History of TIA/CVA   Disposition:   Ms. Glassner has been diagnosed with colon cancer.  I reviewed the prognosis and adjuvant treatment options with she and her son.  I explained the majority of patients with resected stage II colon cancer are cured.  We often recommend adjuvant systemic chemotherapy in patients with "high risk "features.  The T4 lesion is a high risk feature, but given her age and comorbid conditions I do not recommend adjuvant therapy.  The tumor returned MSI-high with loss of MLH1 and PMS2 expression.  This pattern is usually seen in sporadic cancers as opposed to patients with hereditary non-polyposis colon cancer syndrome.  We will submit the tumor for BRAF testing  to exclude a diagnosis of HNPCC.  Her son and other family members are increased risk of developing colorectal cancer and should receive appropriate screening.  The preoperative CT revealed evidence of lymphadenopathy in the central mesentery and porta hepatis.  She could have stage IV disease.  However the numerous negative lymph  nodes at the right colon make this less likely.  Her physicians in [facility] may consider a repeat abdomen CT in several months to be sure these lymph nodes have not enlarged.  We obtained a CEA today to look for normalization.  She may be a candidate for immunotherapy if the cancer returns in the future.  The MSI-high phenotype predicts for a response to PD1 inhibitors.  She plans to establish care with a primary physician in [facility].  I am available to see her in the future as needed.  Betsy Coder, MD  09/16/2017, 4:48 PM

## 2017-09-17 ENCOUNTER — Telehealth: Payer: Self-pay | Admitting: Family Medicine

## 2017-09-17 LAB — CEA (IN HOUSE-CHCC): CEA (CHCC-In House): 2.36 ng/mL (ref 0.00–5.00)

## 2017-09-17 NOTE — Telephone Encounter (Signed)
Copied from [place] [id number]. Topic: General - Other >> Sep 17, 2017  2:01 PM Cecelia Byars, NT wrote: Reason for CRM: Patients son called and said that [facility] will faxing over a request / paperwork to be filled out for the patient to be admitted to the facility,they are needing the paper work filled out and faxed back to them by Friday 09/19/17 ,if there are any questions please call her son Julianne Chamberlin at [phone]

## 2017-09-17 NOTE — Telephone Encounter (Signed)
Will hold message until we receive the paperwork from the facility.

## 2017-09-18 NOTE — Telephone Encounter (Signed)
Pt would like a call back to confirm fax was received. CB#[phone]

## 2017-09-18 NOTE — Telephone Encounter (Signed)
Son states [facility] states they received a confirmation yesterday and they re faxed today. Son states this is urgent to get back to [facility]. They are trying to get her moved in asap. [facility] needs the paperwork tomorrow.

## 2017-09-19 ENCOUNTER — Ambulatory Visit: Payer: Medicare Other | Admitting: Nurse Practitioner

## 2017-09-19 NOTE — Telephone Encounter (Signed)
Form has been received and I have printed out the last OV note with Dr. Elease Hashimoto.  Placed on red folder in office to be done and returned today.

## 2017-09-22 ENCOUNTER — Encounter: Payer: Self-pay | Admitting: Family Medicine

## 2017-09-22 ENCOUNTER — Other Ambulatory Visit (HOSPITAL_COMMUNITY)
Admission: RE | Admit: 2017-09-22 | Discharge: 2017-09-22 | Disposition: A | Discharge status: Home / Self Care | Payer: Medicare Other | Source: Ambulatory Visit | Attending: Oncology | Admitting: Oncology

## 2017-09-22 DIAGNOSIS — C189 Malignant neoplasm of colon, unspecified: Secondary | ICD-10-CM | POA: Diagnosis present

## 2017-09-23 NOTE — Telephone Encounter (Signed)
Form completed, fax and confirmed.  Copy mailed to home address per sons request - DPR

## 2017-09-29 ENCOUNTER — Encounter (HOSPITAL_COMMUNITY): Payer: Self-pay | Admitting: Oncology

## 2017-09-29 ENCOUNTER — Other Ambulatory Visit: Payer: Self-pay | Admitting: Family Medicine

## 2017-10-06 ENCOUNTER — Ambulatory Visit: Payer: Medicare Other | Admitting: Family Medicine

## 2017-10-06 ENCOUNTER — Ambulatory Visit: Payer: Self-pay | Admitting: Family Medicine

## 2017-10-09 NOTE — Progress Notes (Signed)
Called and spoke with son, Aleiah Mohammed, to relay information from Dr. Benay Spice that was discussed at GI Conference on 10/08/17. Consensus was that there could be a possible tumor in the left colon which warrented a sigmoidoscopy and that patient's doctor in [facility] should be made aware.  Patient's son will contact Dr. Tami Ribas at [email or web address]  where patient is residing torelay information. Patient has my direct contact information.

## 2017-10-14 ENCOUNTER — Encounter: Payer: Self-pay | Admitting: Oncology

## 2017-10-14 NOTE — Progress Notes (Signed)
I called her son at approximately 7:45 PM on 10/14/2017.  We discussed the BRAF test result.  We also discussed the likelihood she may have a remaining tumor in the left colon.  Her case was presented at the GI tumor conference on 10/08/2017.  The gastroenterologists and surgeons present felt there may be a remaining tumor in the left colon the negative biopsy on the 08/20/2017 colonoscopy.  Her son will make her local physician aware .  He will let her physician know I am available for questions as needed.

## 2017-10-21 ENCOUNTER — Ambulatory Visit: Payer: Medicare Other | Admitting: Physician Assistant

## 2017-10-27 ENCOUNTER — Telehealth: Payer: Self-pay

## 2017-10-27 ENCOUNTER — Other Ambulatory Visit: Payer: Self-pay

## 2017-10-27 DIAGNOSIS — K56609 Unspecified intestinal obstruction, unspecified as to partial versus complete obstruction: Secondary | ICD-10-CM

## 2017-10-27 NOTE — Telephone Encounter (Signed)
OK 

## 2017-10-27 NOTE — Telephone Encounter (Signed)
Copied from [place] [id number]. Topic: Referral - Request >> Oct 27, 2017 10:06 AM Hewitt Shorts wrote: Pt is needing a referral to a gi office at [place]  fax number [phone]  Best number [phone] Terrilee Croak

## 2017-10-27 NOTE — Telephone Encounter (Signed)
Last OV 09/15/17, No future OV  Please advise if referral is okay.

## 2017-10-27 NOTE — Telephone Encounter (Signed)
Referral has been placed. 

## 2017-11-14 ENCOUNTER — Telehealth: Payer: Self-pay | Admitting: Gastroenterology

## 2017-11-17 NOTE — Telephone Encounter (Signed)
I am not clear on what I am doing with this message. Is it an FYI?

## 2017-11-18 NOTE — Telephone Encounter (Signed)
Yes please schedule office visit first.

## 2017-11-18 NOTE — Telephone Encounter (Signed)
Pt's son is calling back and wants her scheduled for a Flex Sig.  Dr. Benay Spice is recommending her schedule this procedure and son is requesting to speak with a nurse [phone] [name])

## 2017-11-18 NOTE — Telephone Encounter (Signed)
If the patient is wanting to continue her care in [place], do you want to see her before a procedure? I am calling her son. Want to be clear on how we can proceed.

## 2017-11-19 NOTE — Telephone Encounter (Signed)
Please schedule an office visit first. The doctor wants her seen first. Would you please contact the son to arrange this?

## 2017-11-24 NOTE — Telephone Encounter (Signed)
Patient son calling back, very upset that he has not received any calls back. Patients son states Dr.Nandigam preformed a colonoscopy on pt this year and Oncologist Dr.Brad Benay Spice wants a repeat sigmoidoscopy to make sure polyp Dr.Nandigam found is not malignant. Patient son was notified of suggested ov first and offered Dr.Nandigam first open 11.20.19 spot. Patients son states that is not soon enough for his 84 year old mother and wants to know if Dr.Nandigam would reconsider doing the procedure or getting her in sooner. Pt son requesting cb as soon as possible to get this situated.

## 2017-11-24 NOTE — Telephone Encounter (Signed)
Called the son and scheduled the patient with an Advanced Practitioner.

## 2017-12-03 ENCOUNTER — Ambulatory Visit (INDEPENDENT_AMBULATORY_CARE_PROVIDER_SITE_OTHER): Payer: Medicare Other | Admitting: Gastroenterology

## 2017-12-03 ENCOUNTER — Encounter: Payer: Self-pay | Admitting: Gastroenterology

## 2017-12-03 VITALS — BP 122/58 | HR 98 | Ht 63.75 in | Wt 110.1 lb

## 2017-12-03 DIAGNOSIS — D126 Benign neoplasm of colon, unspecified: Secondary | ICD-10-CM | POA: Diagnosis not present

## 2017-12-03 DIAGNOSIS — K6389 Other specified diseases of intestine: Secondary | ICD-10-CM

## 2017-12-03 NOTE — Patient Instructions (Signed)
You have been scheduled for a flexible sigmoidoscopy. Please follow the written instructions given to you at your visit today. If you use inhalers (even only as needed), please bring them with you on the day of your procedure.  

## 2017-12-03 NOTE — Progress Notes (Signed)
12/03/2017 [name] [id number] 1920-07-07   HISTORY OF PRESENT ILLNESS: This is a 84 year old female who was actually seen by me initially in January 2019 for complaints of diarrhea.  Fast-forward 6 months and July 2019 she ended up in the hospital.  Underwent colonoscopy by Dr. Silverio Decamp was found to have a right-sided colon mass that was invasive adenocarcinoma.  Also had a 5 x 3 cm left colon mass, but biopsies showed serrated adenoma with no malignancy identified.  She underwent resection of the right colon mass, all lymph nodes negative.  Oncology presented her case to the tumor board and they are suggesting a repeat flexible sigmoidoscopy to reevaluate the left colon mass due to its size previously.  She tells me that overall she is feeling well.  She gained a few pounds.  She does have some constipation, but moves her bowels every few days with the help of 2 Senokot-S at bedtime and as needed MiraLAX.  Does get some left-sided discomfort if she has not moved her bowels for several days.   Past Medical History:  Diagnosis Date  . ANEMIA, NORMOCYTIC 11/30/2009  . CAD (coronary artery disease) 04/27/2008   Non-Obstructive >> Cardiac catheterization 6/08: EF 55%, 1+ MR, Dx 50%, LAD 50%, dLAD 40%, OM1 50-60% (up to 70%), pOM 30%, pRCA 40%, mRCA 50-60%, pPDA 50-60% (up to 70%).  Medical therapy was recommended // MV 11/14: Normal stress nuclear study. LV Ejection Fraction: 67%.   . Carotid artery disease ([place])    Left CEA in 10/12.  Carotid dopplers (10/15) with 1-39% bilateral ICA stenosis. // Carotid US 10/17: R < 40; patent L CEA site  . COLONIC POLYPS, ADENOMATOUS 12/26/2009  . DIABETES MELLITUS, TYPE II 08/11/2006  . DIVERTICULOSIS, COLON 08/11/2006  . Fall 09/06/2016  . GERD 02/16/2007  . Hemorrhoids    s/p banding  . History of echocardiogram    Echo 5/18: EF 60-65, no RWMA, Gr 2 DD, mild-mod MR, mod LAE // Echo 2/17: Mild LVH, mild focal basal septal hypertrophy, EF 55-60%, no  RWMA, Gr 1 DD, MAC, mild MR, mild  // Echo 4/14: EF 60-65%, normal wall motion, Gr 1 DD, MAC, mild MR, PASP 32, reduced excursion of AV noncoronary cusp.    Marland Kitchen HYPERLIPIDEMIA 09/09/2006  . HYPERTENSION 09/09/2006  . LEFT BUNDLE BRANCH BLOCK 08/11/2006  . Open fracture of nasal bones   . OSTEOARTHRITIS 08/11/2006  . OSTEOPOROSIS 08/11/2006  . PAF (paroxysmal atrial fibrillation) ([place]) 04/12/2009   Paroxysmal, only prior documented episode was years ago.  She was not started on anticoagulation back then due to hemorrhoidal bleeding, apparently profuse.  Event monitor (3/14) showed only NSR.    Marland Kitchen Stroke ([place]) 12-06-13  . Syncope    8/15. EEG (5/15) was unremarkable. // 4/18: ILR neg for arrhythmia - ? orthostatic (no prodrome reported) >> c/b SAH (small)   . TIA 09/02/2006   Echocardiogram 6/12: Mild to moderate MR, trace AI, trace TR, PASP 34, bubble study negative for intracardiac shunting, normal EF (greater than 55%) //  Possible TIA in 10/15 with visual field cut.  MRI (10/15) with no CVA. Linq monitor placed.   Past Surgical History:  Procedure Laterality Date  . BIOPSY  08/20/2017   Procedure: BIOPSY;  Surgeon: Mauri Pole, MD;  Location: [facility];  Service: Endoscopy;;  . CAROTID ENDARTERECTOMY Left 12-05-2010  . CLOSED REDUCTION METATARSAL FRACTURE     right 5th  . COLONOSCOPY W/ POLYPECTOMY  2001  .  COLONOSCOPY WITH PROPOFOL N/A 08/20/2017   Procedure: COLONOSCOPY WITH PROPOFOL;  Surgeon: Mauri Pole, MD;  Location: [facility];  Service: Endoscopy;  Laterality: N/A;  . EYE SURGERY     catarac  . LAPAROSCOPIC PARTIAL COLECTOMY Right 08/21/2017   Procedure: LAPAROSCOPIC RIGHT COLECTOMY;  Surgeon: Johnathan Hausen, MD;  Location: [facility];  Service: General;  Laterality: Right;  . LOOP RECORDER IMPLANT N/A 01/03/2014   Procedure: LOOP RECORDER IMPLANT;  Surgeon: Evans Lance, MD;  Location: [facility] CATH LAB;  Service: Cardiovascular;  Laterality: N/A;  . SCHLEROTHERAPY   08/20/2017   Procedure: Woodward Ku;  Surgeon: Mauri Pole, MD;  Location: [facility];  Service: Endoscopy;;  spot tattoo  . TONSILLECTOMY      reports that she has never smoked. She has never used smokeless tobacco. She reports that she does not drink alcohol or use drugs. family history includes Breast cancer in her sister; Colon cancer in her brother; Congestive Heart Failure (age of onset: 55) in her father; Heart attack in her mother; Heart disease in her brother; Heart disease (age of onset: 76) in her mother; Kidney disease in her mother; Uterine cancer in her sister. Allergies  Allergen Reactions  . Sulfamethoxazole-Trimethoprim     unknown  . Sulfonamide Derivatives     REACTION: rash  . Nitrofurantoin     Unknown. Macrobid.       Outpatient Encounter Medications as of 12/03/2017  Medication Sig  . acetaminophen (TYLENOL) 325 MG tablet Take 2 tablets (650 mg total) by mouth every 6 (six) hours as needed for mild pain.  Marland Kitchen aspirin EC 81 MG tablet Take 1 tablet (81 mg total) by mouth daily. With food  . atorvastatin (LIPITOR) 20 MG tablet Take 1 tablet (20 mg total) by mouth daily at 6 PM.  . calcium carbonate (TUMS - DOSED IN MG ELEMENTAL CALCIUM) 500 MG chewable tablet Chew 1 tablet (200 mg of elemental calcium total) by mouth daily.  . calcium carbonate 200 MG capsule Take 200 mg by mouth daily.   . fish oil-omega-3 fatty acids 1000 MG capsule Take 1 capsule by mouth daily.   Marland Kitchen gabapentin (NEURONTIN) 100 MG capsule Take 1 capsule (100 mg total) by mouth at bedtime. Toe pain  . metoprolol tartrate (LOPRESSOR) 25 MG tablet TAKE ONE-HALF (1/2) TABLET TWICE A DAY  . Multiple Vitamins-Minerals (MULTIVITAMIN WITH MINERALS) tablet Take 1 tablet by mouth daily.  . ONE TOUCH LANCETS MISC Use daily as directed  . pantoprazole (PROTONIX) 20 MG tablet Take 1 tablet (20 mg total) by mouth daily.  . polyethylene glycol (MIRALAX / GLYCOLAX) packet Take 17 g by mouth daily.  Marland Kitchen  senna-docusate (SENOKOT-S) 8.6-50 MG tablet Take 2 tablets by mouth at bedtime.  Marland Kitchen VITAMIN E PO Take 1 tablet by mouth daily.   . [DISCONTINUED] Ascorbic Acid (VITAMIN C PO) Take 1 tablet by mouth daily.   . [DISCONTINUED] Cholecalciferol (VITAMIN D PO) Take 1 tablet by mouth daily.   . [DISCONTINUED] Cyanocobalamin (VITAMIN B 12 PO) Take 1 tablet by mouth daily.   . [DISCONTINUED] glucose blood test strip Use once daily  . [DISCONTINUED] methocarbamol (ROBAXIN) 500 MG tablet Take 1 tablet (500 mg total) by mouth 2 (two) times daily.  . [DISCONTINUED] mirtazapine (REMERON) 7.5 MG tablet Take 1 tablet (7.5 mg total) by mouth at bedtime.  . [DISCONTINUED] oxyCODONE (OXY IR/ROXICODONE) 5 MG immediate release tablet Take 0.5 tablets (2.5 mg total) by mouth every 6 (six) hours as needed for  moderate pain or severe pain. (Patient not taking: Reported on 09/15/2017)   No facility-administered encounter medications on file as of 12/03/2017.      REVIEW OF SYSTEMS  : All other systems reviewed and negative except where noted in the History of Present Illness.   PHYSICAL EXAM: BP (!) 122/58   Pulse 98   Ht 5' 3.75" (1.619 m)   Wt 110 lb 2 oz (50 kg)   BMI 19.05 kg/m  General: Well developed white female in no acute distress Head: Normocephalic and atraumatic Eyes:  Sclerae anicteric, conjunctiva pink. Ears: Normal auditory acuity Lungs: Clear throughout to auscultation; no increased WOB. Heart: Regular rate and rhythm; no M/R/G. Abdomen: Soft, non-distended.  BS present.  Minimal left sided TTP. Rectal:  Will be done at the time of colonoscopy. Musculoskeletal: Symmetrical with no gross deformities  Skin: No lesions on visible extremities Extremities: No edema  Neurological: Alert oriented x 4, grossly non-focal Psychological:  Alert and cooperative. Normal mood and affect  ASSESSMENT AND PLAN: *84 year old female with right-sided colon cancer status post resection.  Also had a large mass  in the left colon, but biopsies showed only serrated adenoma with no malignancy identified.  Per oncology they are recommending a repeat flex sigmoidoscopy to reevaluate size and rebiopsy the area.  We will schedule with Dr. Silverio Decamp.  Patient's son is requesting early December.  I do not think there is any significant urgency to this. *Constipation: Moving bowels every few days with MiraLAX intermittently and Senokot 2 pills at bedtime.  Discussed beginning MiraLAX daily.  **The risks, benefits, and alternatives to flex sig were discussed with the patient and she consents to proceed.   CC:  Eulas Post, MD

## 2017-12-12 ENCOUNTER — Other Ambulatory Visit: Payer: Self-pay | Admitting: Family Medicine

## 2017-12-22 NOTE — Progress Notes (Signed)
Reviewed and agree with documentation and assessment and plan. K. Veena Nandigam , MD   

## 2018-01-14 ENCOUNTER — Telehealth: Payer: Self-pay | Admitting: Gastroenterology

## 2018-01-14 NOTE — Telephone Encounter (Signed)
Patients son wanting to know if the polyp will be removed or just biopsied during pts procedure on 12.9.19. Pt had colonoscopy on 7.10.19 and then surgery. Please advise.

## 2018-01-14 NOTE — Telephone Encounter (Signed)
Patient had her office visit with Alonza Bogus PA but I think this is a question you could answer. Thank you.

## 2018-01-14 NOTE — Telephone Encounter (Signed)
Discussed with the son.

## 2018-01-14 NOTE — Telephone Encounter (Signed)
Depending on the size, if its small and can be safely removed will do it but if its large and extending onto anastomosis, will biopsy it, as may need EMR or surgery for complete removal.

## 2018-01-19 ENCOUNTER — Encounter: Payer: Self-pay | Admitting: Gastroenterology

## 2018-01-19 ENCOUNTER — Ambulatory Visit (AMBULATORY_SURGERY_CENTER): Payer: Medicare Other | Admitting: Gastroenterology

## 2018-01-19 VITALS — BP 160/66 | HR 73 | Temp 96.8°F | Resp 14 | Ht 63.0 in | Wt 110.0 lb

## 2018-01-19 DIAGNOSIS — D126 Benign neoplasm of colon, unspecified: Secondary | ICD-10-CM | POA: Diagnosis present

## 2018-01-19 DIAGNOSIS — D125 Benign neoplasm of sigmoid colon: Secondary | ICD-10-CM

## 2018-01-19 DIAGNOSIS — K635 Polyp of colon: Secondary | ICD-10-CM

## 2018-01-19 DIAGNOSIS — Z85038 Personal history of other malignant neoplasm of large intestine: Secondary | ICD-10-CM

## 2018-01-19 MED ORDER — SODIUM CHLORIDE 0.9 % IV SOLN: 500.0000 mL | Freq: Once | INTRAVENOUS | Status: DC

## 2018-01-19 MED ORDER — SODIUM CHLORIDE 0.9 % IV SOLN: 500.0000 mL | Freq: Once | INTRAVENOUS | Status: AC

## 2018-01-19 NOTE — Progress Notes (Signed)
Called to room to assist during endoscopic procedure.  Patient ID and intended procedure confirmed with present staff. Received instructions for my participation in the procedure from the performing physician.  

## 2018-01-19 NOTE — Progress Notes (Signed)
Pt's states no medical or surgical changes since previsit or office visit. 

## 2018-01-19 NOTE — Patient Instructions (Signed)
   AWAIT BIOPSY RESULTS-DR NANDIGAM WILL CALL PT'S SON WITH RESULTS  NO ASPIRIN ,NAPROXEN ,OR OTHER NON-STEROIDAL ANTI INFLAMMATORY DRUGS   COLACE ( OVER THE COUNTER) ONE AT BEDTIME ,DISCONTINUE THE SENOKOT    RESUME PREVIOUS DIET   YOU HAD AN ENDOSCOPIC PROCEDURE TODAY AT [facility]:   Refer to the procedure report that was given to you for any specific questions about what was found during the examination.  If the procedure report does not answer your questions, please call your gastroenterologist to clarify.  If you requested that your care partner not be given the details of your procedure findings, then the procedure report has been included in a sealed envelope for you to review at your convenience later.  YOU SHOULD EXPECT: Some feelings of bloating in the abdomen. Passage of more gas than usual.  Walking can help get rid of the air that was put into your GI tract during the procedure and reduce the bloating. If you had a lower endoscopy (such as a colonoscopy or flexible sigmoidoscopy) you may notice spotting of blood in your stool or on the toilet paper. If you underwent a bowel prep for your procedure, you may not have a normal bowel movement for a few days.  Please Note:  You might notice some irritation and congestion in your nose or some drainage.  This is from the oxygen used during your procedure.  There is no need for concern and it should clear up in a day or so.  SYMPTOMS TO REPORT IMMEDIATELY:   Following lower endoscopy (colonoscopy or flexible sigmoidoscopy):  Excessive amounts of blood in the stool  Significant tenderness or worsening of abdominal pains  Swelling of the abdomen that is new, acute  Fever of 100F or higher    For urgent or emergent issues, a gastroenterologist can be reached at any hour by calling [phone].   DIET:  We do recommend a small meal at first, but then you may proceed to your regular diet.  Drink plenty of fluids  but you should avoid alcoholic beverages for 24 hours.  ACTIVITY:  You should plan to take it easy for the rest of today and you should NOT DRIVE or use heavy machinery until tomorrow (because of the sedation medicines used during the test).    FOLLOW UP: Our staff will call the number listed on your records the next business day following your procedure to check on you and address any questions or concerns that you may have regarding the information given to you following your procedure. If we do not reach you, we will leave a message.  However, if you are feeling well and you are not experiencing any problems, there is no need to return our call.  We will assume that you have returned to your regular daily activities without incident.  If any biopsies were taken you will be contacted by phone or by letter within the next 1-3 weeks.  Please call us at [phone] if you have not heard about the biopsies in 3 weeks.    SIGNATURES/CONFIDENTIALITY: You and/or your care partner have signed paperwork which will be entered into your electronic medical record.  These signatures attest to the fact that that the information above on your After Visit Summary has been reviewed and is understood.  Full responsibility of the confidentiality of this discharge information lies with you and/or your care-partner.

## 2018-01-19 NOTE — Progress Notes (Signed)
PT taken to PACU. Monitors in place. VSS. Report given to RN. 

## 2018-01-19 NOTE — Op Note (Signed)
[facility] Patient Name: [name] Procedure Date: 01/19/2018 10:27 AM MRN: [id number] Endoscopist: Mauri Pole , MD Age: 84 Referring MD:  Date of Birth: [birth year] Gender: Female Account #: [id number] Procedure:                Flexible Sigmoidoscopy Indications:              Colorectal cancer in Ascending colon s/p surgical                            resection, obtain biopsies of the advance serrated                            adenoma in the sigmoid colon Medicines:                Monitored Anesthesia Care Procedure:                Pre-Anesthesia Assessment:                           - Prior to the procedure, a History and Physical                            was performed, and patient medications and                            allergies were reviewed. The patient's tolerance of                            previous anesthesia was also reviewed. The risks                            and benefits of the procedure and the sedation                            options and risks were discussed with the patient.                            All questions were answered, and informed consent                            was obtained. Prior Anticoagulants: The patient has                            taken no previous anticoagulant or antiplatelet                            agents. ASA Grade Assessment: III - A patient with                            severe systemic disease. After reviewing the risks                            and benefits, the patient was deemed in  satisfactory condition to undergo the procedure.                           After obtaining informed consent, the scope was                            passed under direct vision. The Model PCF-H190DL                            [id number]) scope was introduced through the anus                            and advanced to the the descending colon. The                            flexible  sigmoidoscopy was technically difficult                            and complex due to poor bowel prep with stool                            present, poor endoscopic visualization and a                            tortuous colon. The quality of the bowel                            preparation was poor. Scope In: Scope Out: Findings:                 The perianal and digital rectal examinations were                            normal.                           A 20 mm polypoid lesion was found in the proximal                            sigmoid colon at 45cm from anal verge identified by                            previously tattoo adjancent the polyp. The lesion                            was polypoid and ulcerated. No bleeding was                            present. This was biopsied with a cold forceps for                            histology. Complications:            No immediate complications. Estimated Blood Loss:     Estimated blood loss was minimal. Impression:               -  Preparation of the colon was poor.                           - Rule out malignancy, polypoid lesion in the                            proximal sigmoid colon. Biopsied. Recommendation:           - Resume previous diet.                           - Continue present medications.                           - No ibuprofen, naproxen, or other non-steroidal                            anti-inflammatory drugs.                           - Await pathology results. Mauri Pole, MD 01/19/2018 11:17:06 AM This report has been signed electronically.

## 2018-01-20 ENCOUNTER — Telehealth: Payer: Self-pay

## 2018-01-20 NOTE — Telephone Encounter (Signed)
  Follow up Call-  Call back number 01/19/2018  Post procedure Call Back phone  # [phone]  Permission to leave phone message Yes  Some recent data might be hidden     Patient questions:  Do you have a fever, pain , or abdominal swelling? No. Pain Score  0 *  Have you tolerated food without any problems? Yes.    Have you been able to return to your normal activities? Yes.    Do you have any questions about your discharge instructions: Diet   No. Medications  No. Follow up visit  No.  Do you have questions or concerns about your Care? No.  Actions: * If pain score is 4 or above: No action needed, pain <4.

## 2018-01-26 ENCOUNTER — Telehealth: Payer: Self-pay | Admitting: Gastroenterology

## 2018-01-26 NOTE — Telephone Encounter (Signed)
Called him back and discussed the results.  He will talk to his mom about the results.  For now the plan is to hold off polypectomy or a repeat colonoscopy and continue to monitor as long as she remains asymptomatic.

## 2018-01-26 NOTE — Telephone Encounter (Signed)
Mr Mairead Schwarzkopf returned call when you were in a room with a patient. I took his number [phone] for a return call. He will be very sure to answer immediately.

## 2018-10-12 IMAGING — CT CT HEAD W/O CM
4 series · 16 of 47 positions shown, 18 images · non-contrast
Comparison: 10/25/2013 head CT

CLINICAL DATA: Syncope

EXAM:
CT HEAD WITHOUT CONTRAST
TECHNIQUE: Contiguous axial images were obtained from the base of the skull
through the vertex without intravenous contrast.

[Series 3: head without · axial · non-contrast · 0.40mm/px · z∈[-112,+8]mm · 7 of 33 slices shown, 9 images]
[im 5/33  brain]
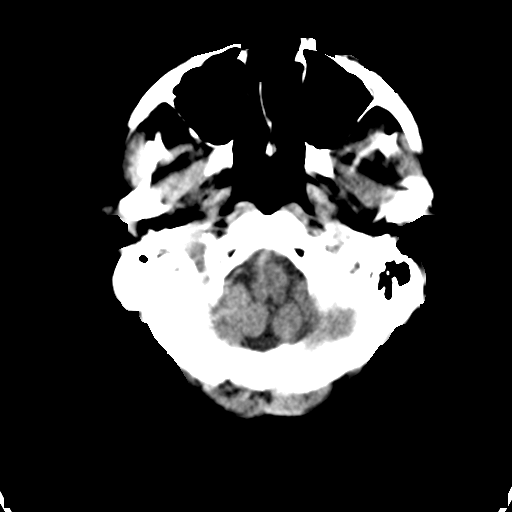
[im 5/33  bone]
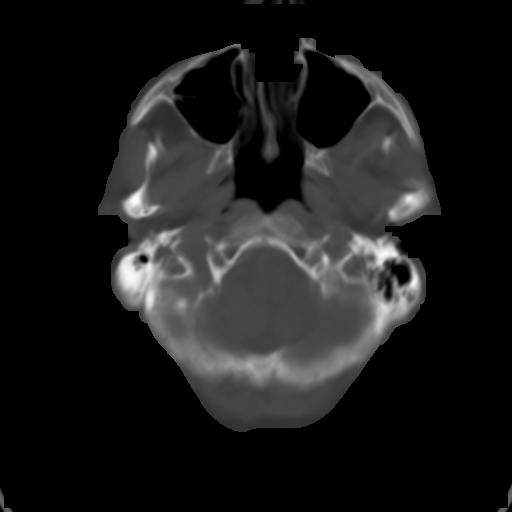
[im 9/33  brain]
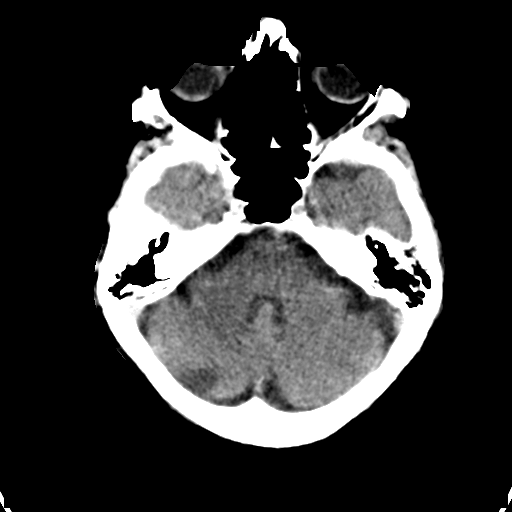
[im 13/33  brain]
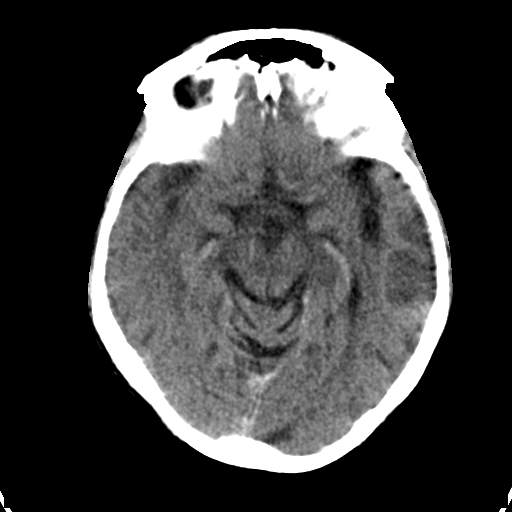
[im 17/33  brain]
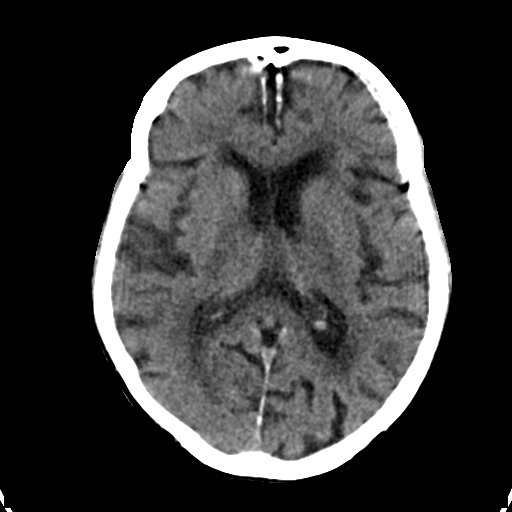
[im 21/33  brain]
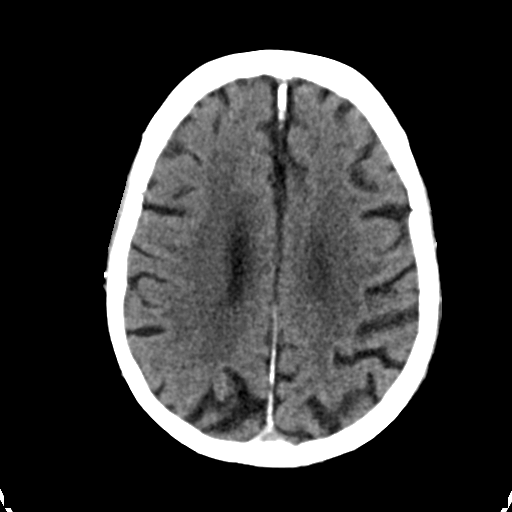
[im 21/33  bone]
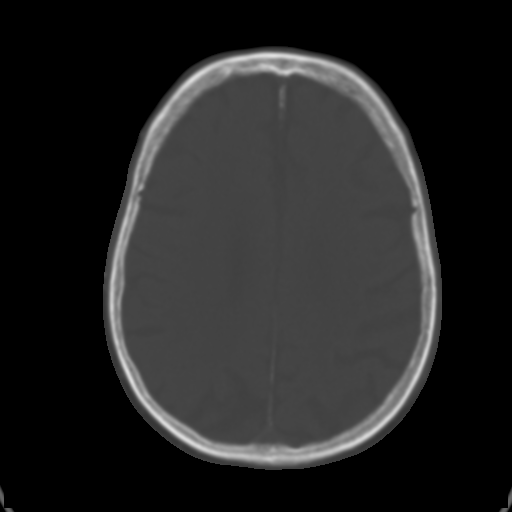
[im 25/33  brain]
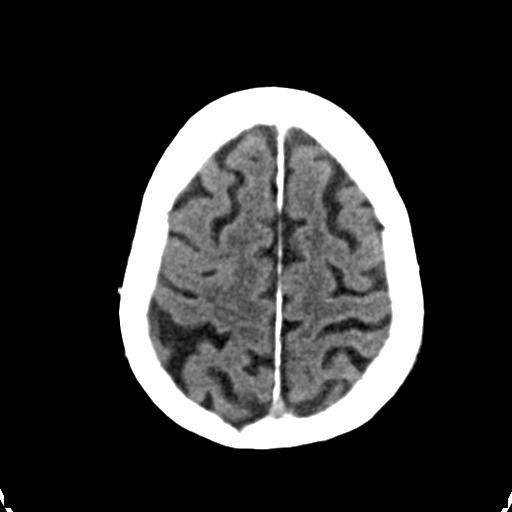
[im 29/33  brain]
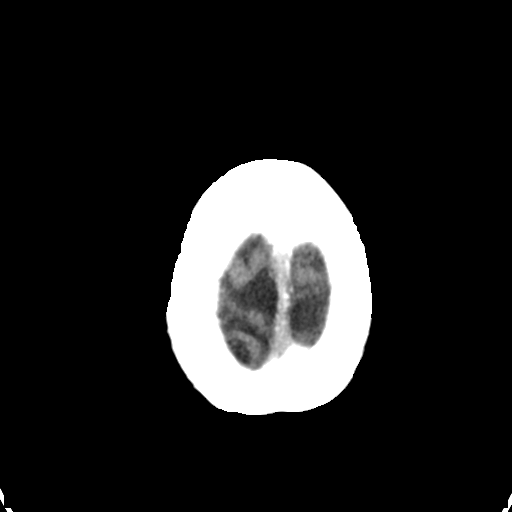

[Series 4: head bone · axial · 0.40mm/px · z∈[-116,-84]mm · 3 of 83 slices shown]
[im 9/83  bone]
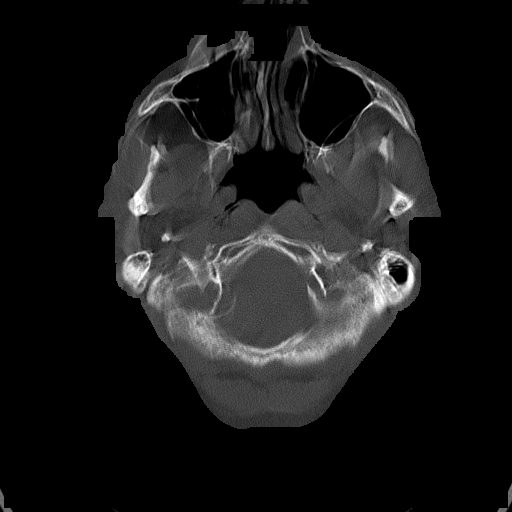
[im 17/83  bone]
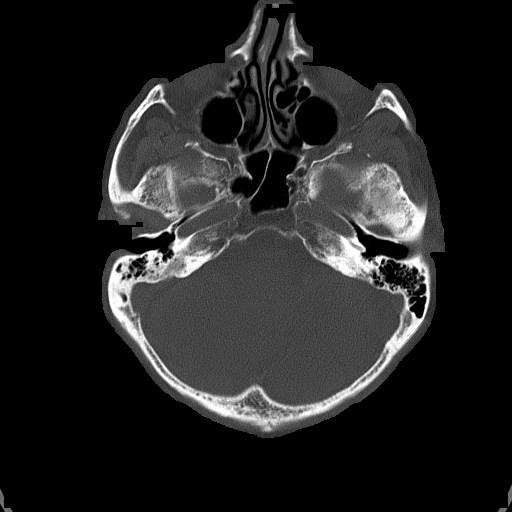
[im 25/83  bone]
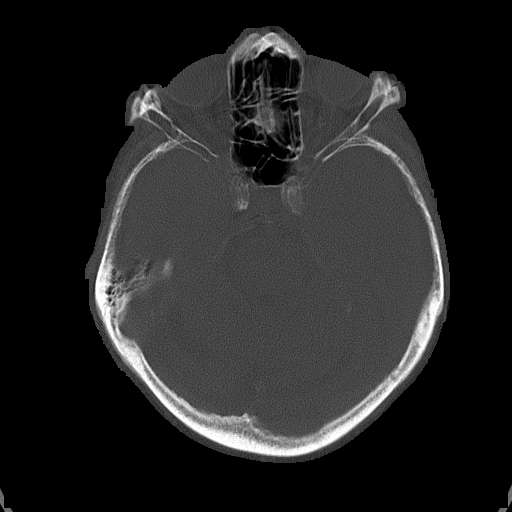

[Series 5: head without cor · coronal · non-contrast · 0.32mm/px · 3 of 67 slices shown]
[im 23/67  brain]
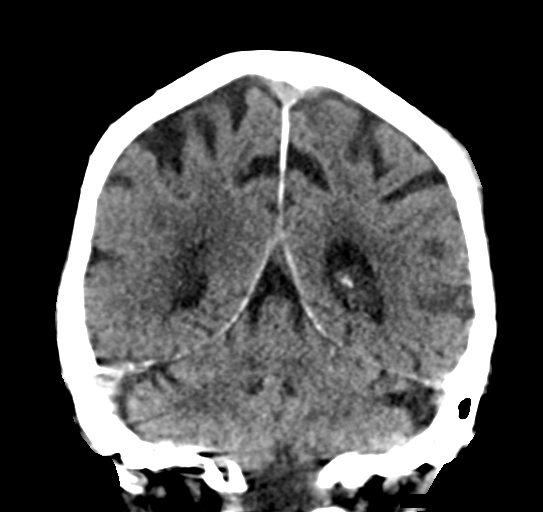
[im 30/67  brain]
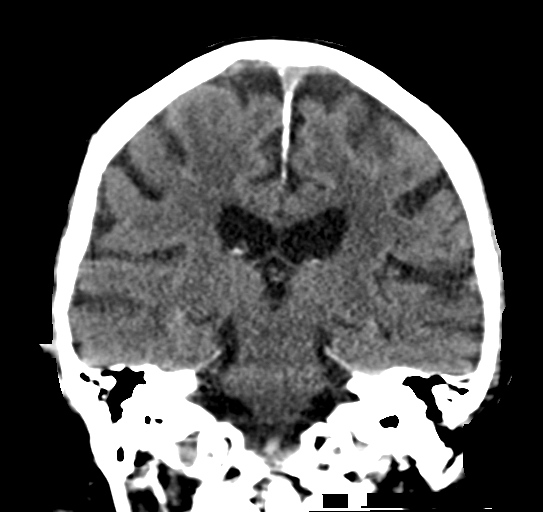
[im 37/67  brain]
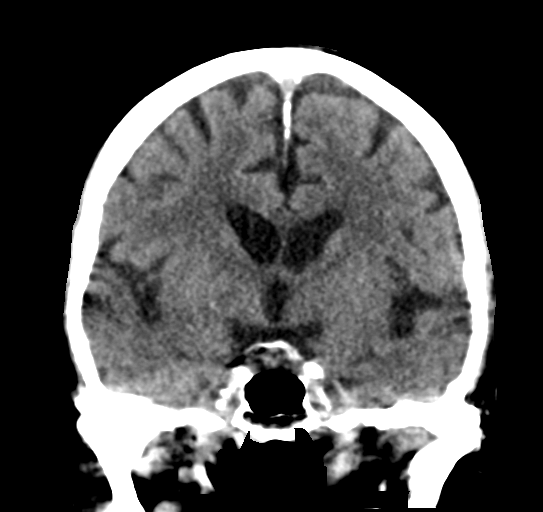

[Series 6: head without sag · sagittal · non-contrast · 0.34mm/px · 3 of 67 slices shown]
[im 23/67  brain]
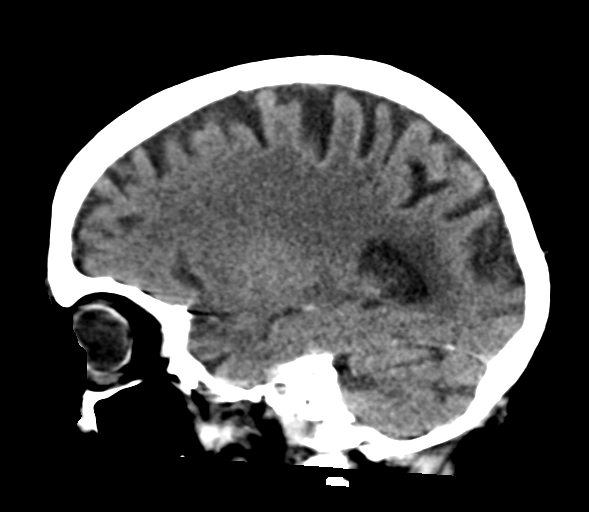
[im 34/67  brain]
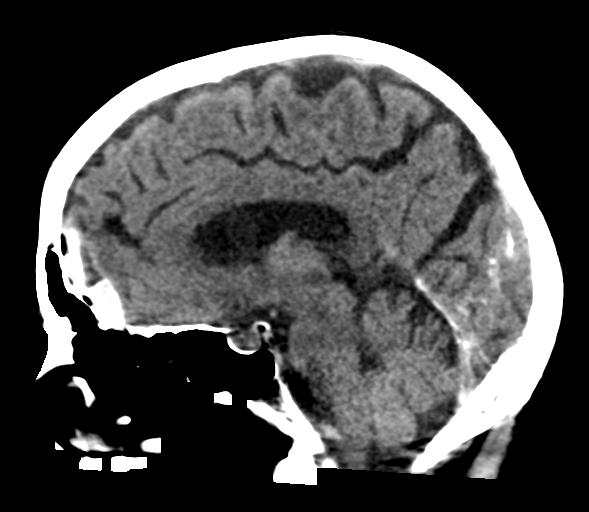
[im 45/67  brain]
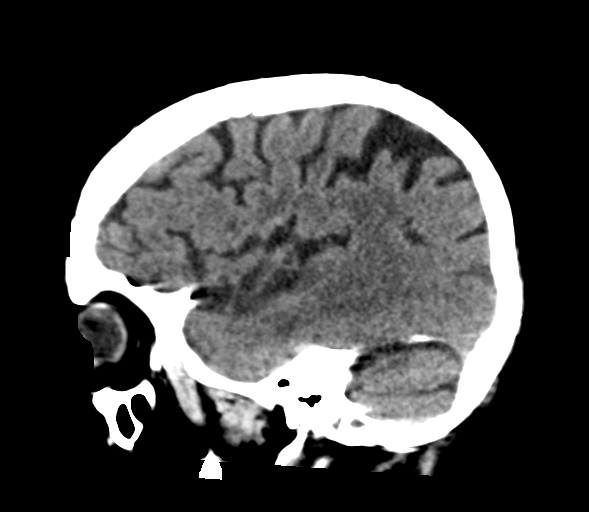

[16 of 47 positions shown; findings below may reference images not displayed]

FINDINGS: BRAIN: There is mild sulcal and ventricular prominence consistent
with superficial and central atrophy. Tiny focus of subarachnoid
hemorrhage noted in the right posterior frontal lobe near the
vertex, series 3, image 28, series 5, image 38. Moderate degree of
small vessel ischemic disease in the periventricular and subcortical
white matter. Basal ganglial calcifications are seen bilaterally. No
intra-axial mass nor extra-axial fluid.

VASCULAR: Moderate calcific atherosclerosis of the carotid siphons.

SKULL: No skull fracture.  Left posterior parietal scalp hematoma.

SINUSES/ORBITS: The mastoid air-cells are clear. The included
paranasal sinuses are well-aerated.The included ocular globes and
orbital contents are non-suspicious.

OTHER: None.
IMPRESSION: 1. Left posterior parietal scalp hematoma without underlying
fracture.
2. Tiny focus of acute subarachnoid hemorrhage in the posterior high
frontal lobe near the vertex. This may represent contrecoup injury.
Critical Value/emergent results were called by telephone at the time
of interpretation on 06/03/2016 at [DATE] to Dr. YESIKA DUMANOIR , who
verbally acknowledged these results.

## 2019-12-29 IMAGING — DX DG CHEST 2V
2 series · 2 of 2 positions shown · non-contrast
Comparison: 09/06/2016

CLINICAL DATA: Preoperative evaluation for colon surgery

EXAM:
CHEST - 2 VIEW

[chest lat]
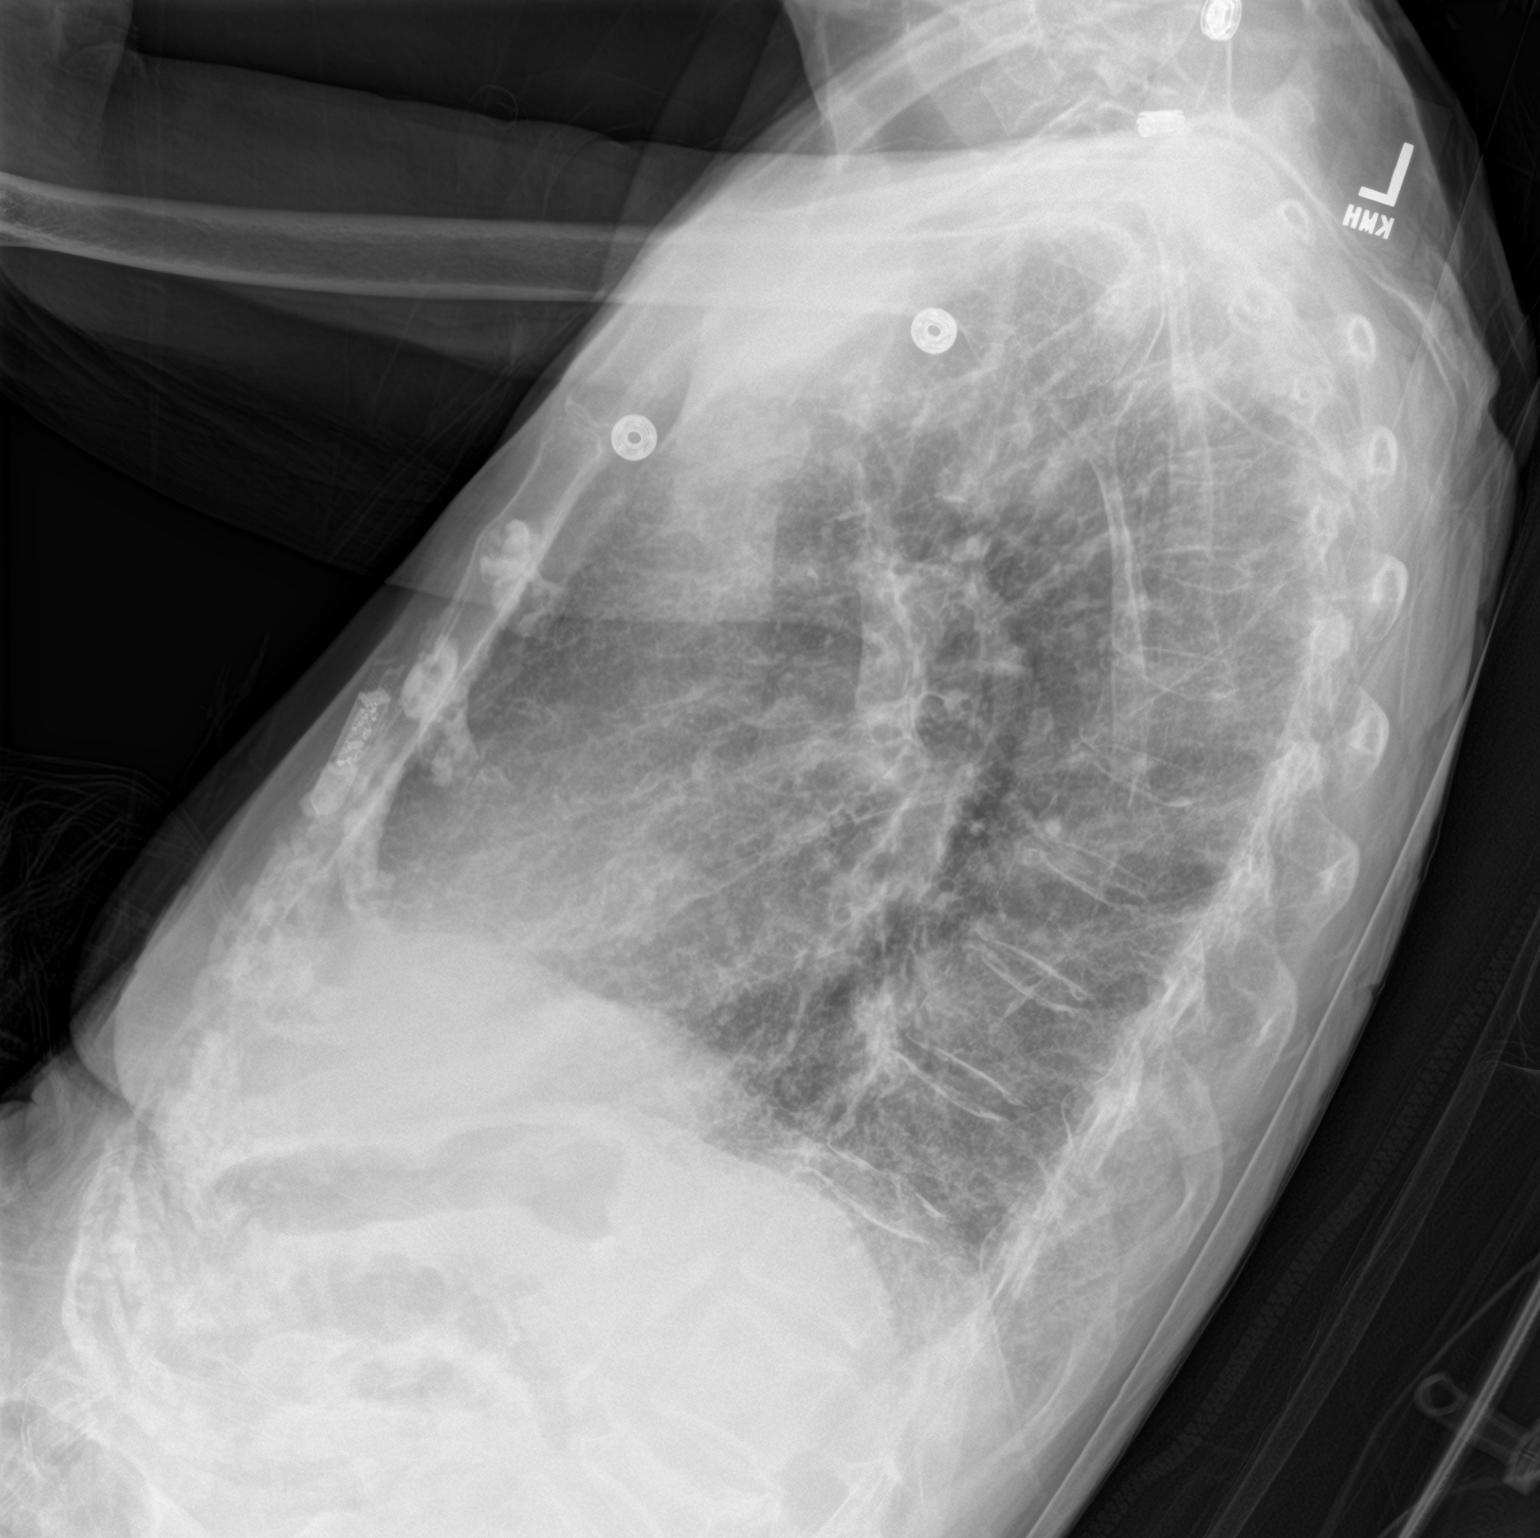

[chest ap]
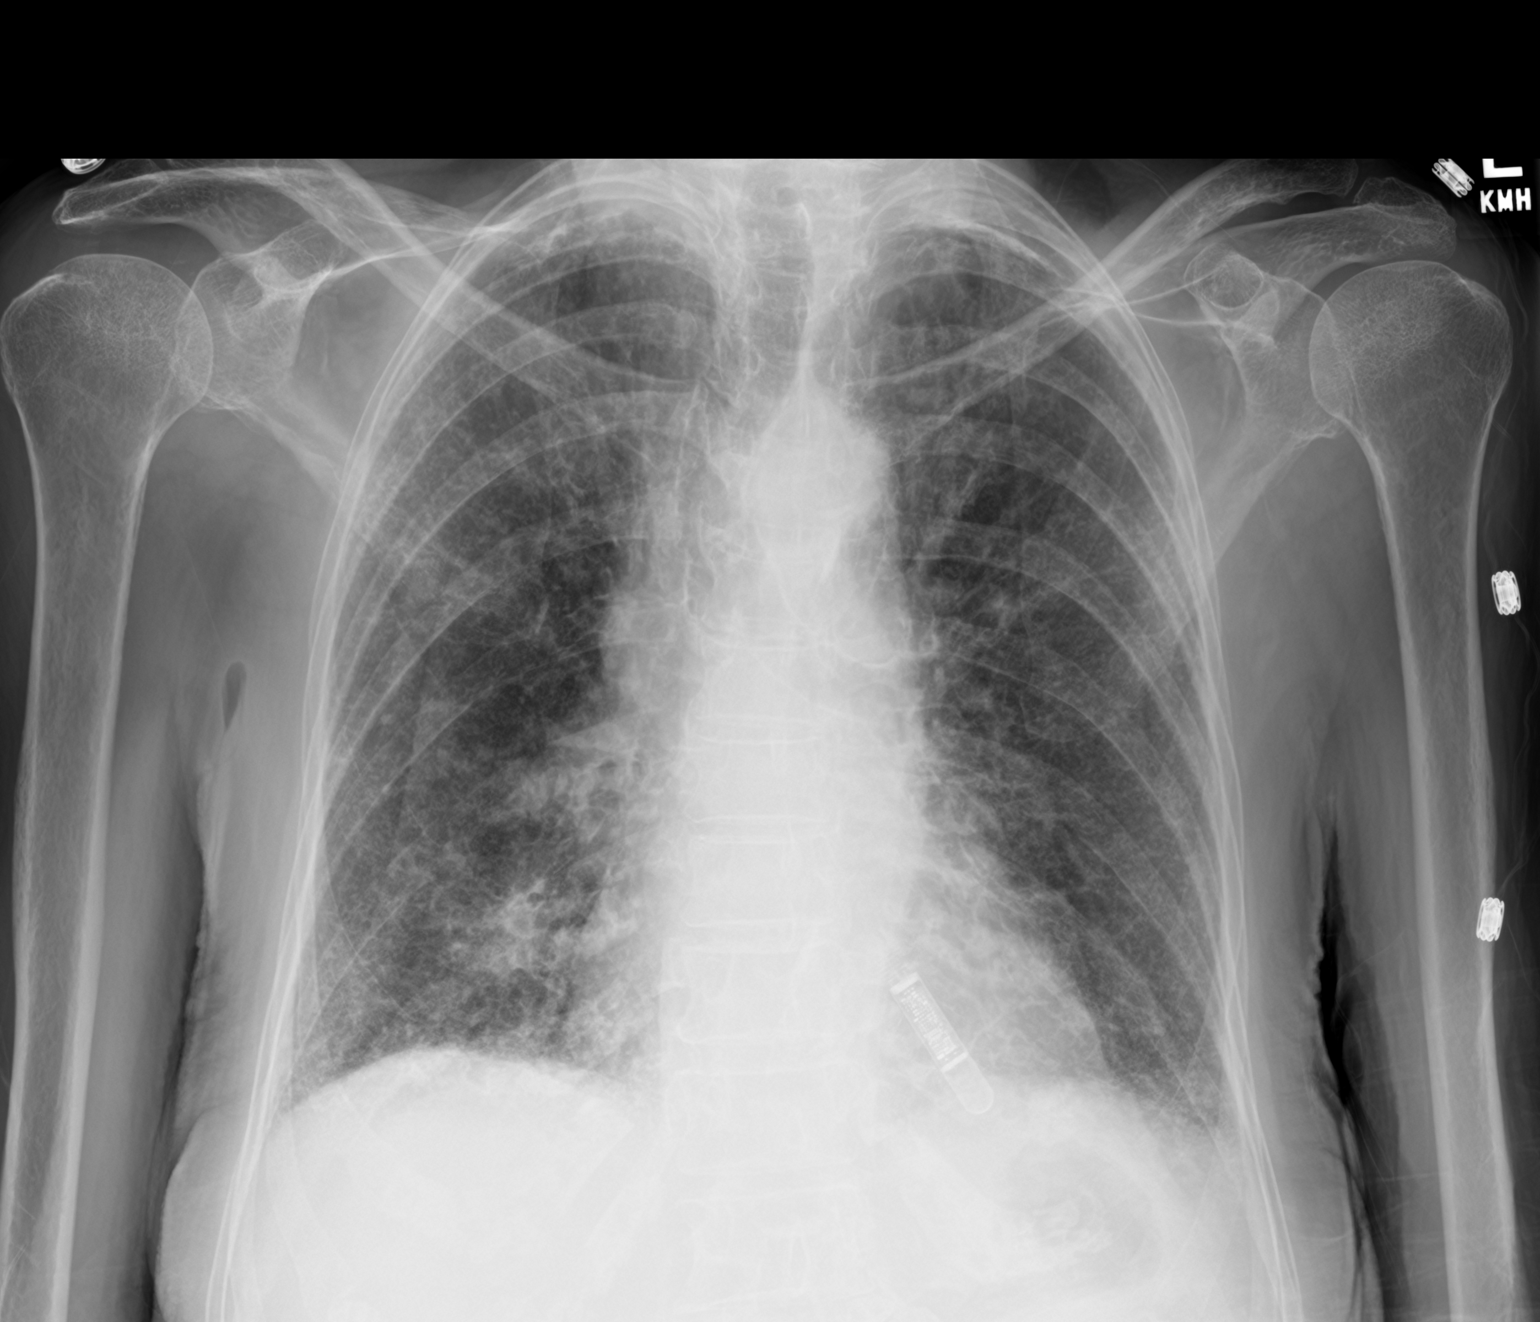

[2 of 2 positions shown; findings below may reference images not displayed]

FINDINGS: Loop recorder projects over lower medial LEFT chest.

Normal heart size, mediastinal contours, and pulmonary vascularity.

Hyperinflation and bronchitic changes question COPD.

Biapical scarring.

Diffuse interstitial prominence throughout both lungs similar to
previous exam favoring pulmonary fibrosis.

No definite acute infiltrate, pleural effusion or pneumothorax.

Bones demineralized.
IMPRESSION: Probable pulmonary fibrosis and question underlying COPD.

No acute abnormalities.

## 2020-01-01 IMAGING — DX DG CHEST 1V PORT
1 series · 1 of 1 positions shown · non-contrast
Comparison: August 20, 2017

CLINICAL DATA: Productive cough.

EXAM:
PORTABLE CHEST 1 VIEW

[chest ap]
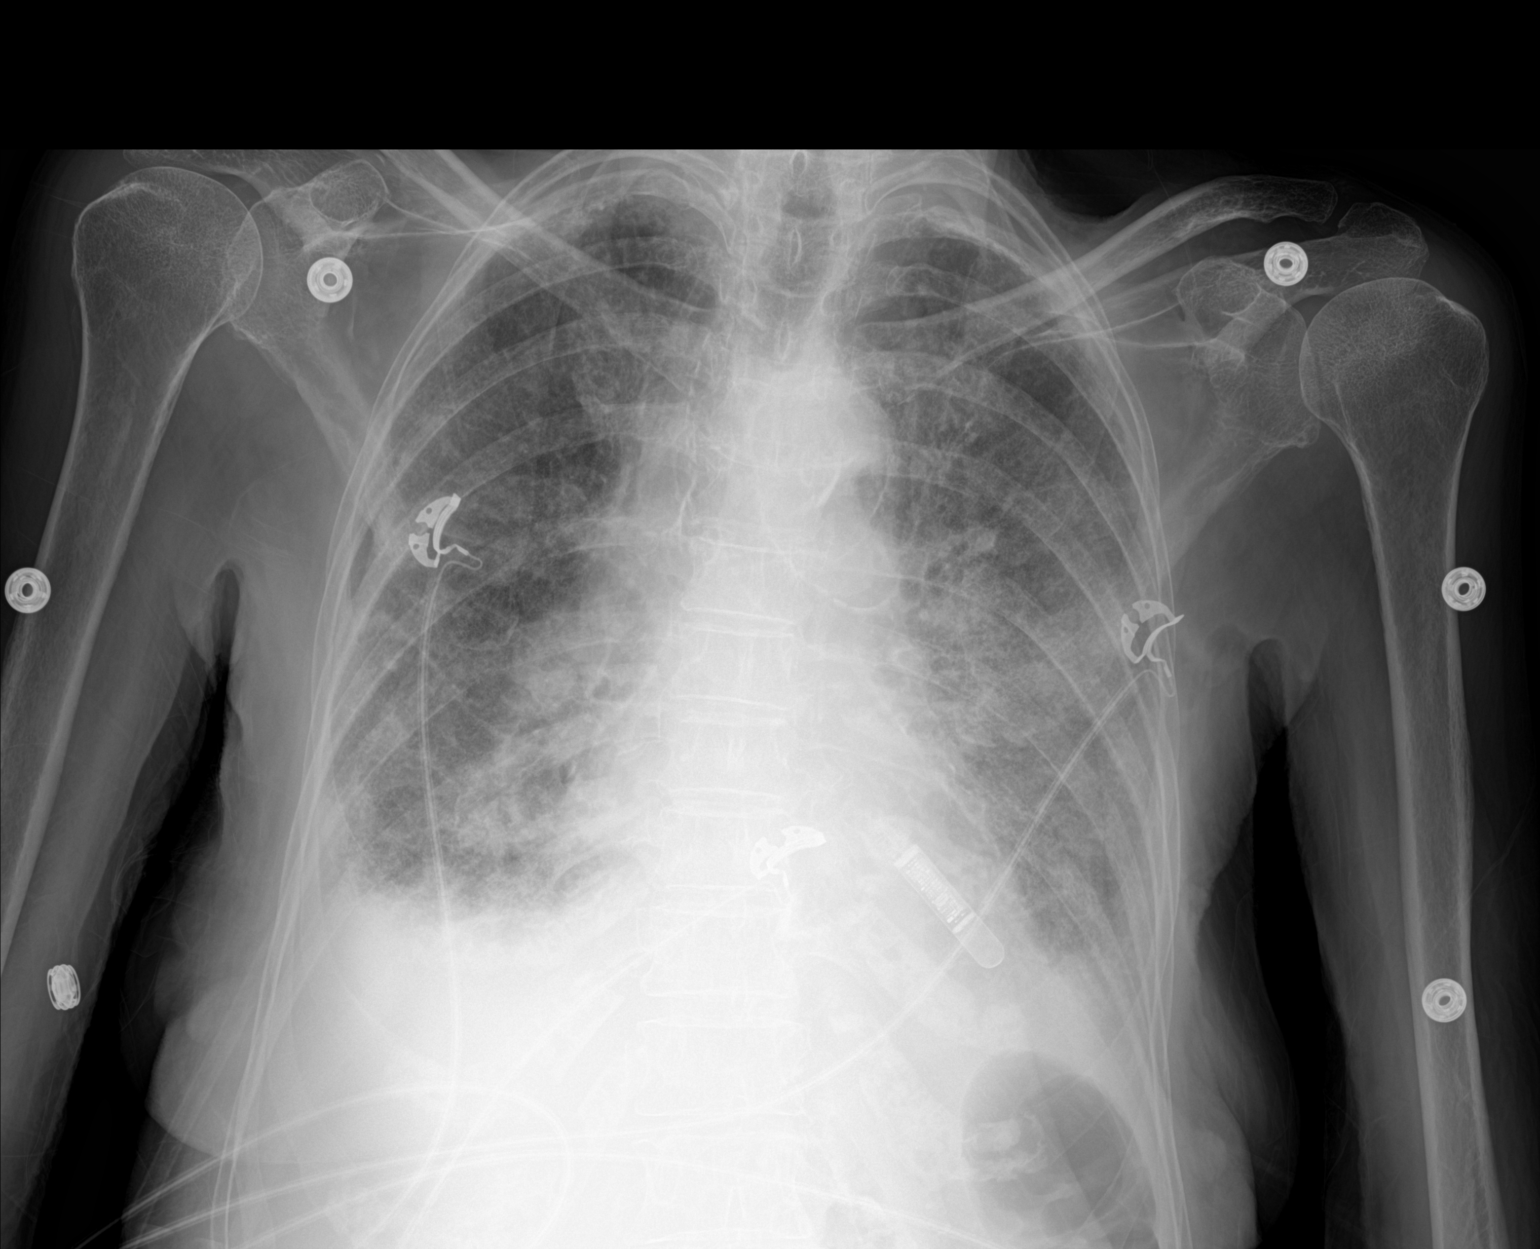

[1 of 1 positions shown; findings below may reference images not displayed]

FINDINGS: There is a background of increased interstitial markings consistent
with known interstitial lung disease. However, the interstitial
opacity has increased in the interval. Mild opacities in the bases
may be associated with small effusions. Mildly more focal opacity
seen in the left mid lung as well. The heart size borderline. The
hila and mediastinum are unremarkable. No nodules or masses. No
focal infiltrates.
IMPRESSION: 1. The findings are suggestive of pulmonary edema seen on the
background of known interstitial lung disease.
2. Bibasilar opacities are likely associated with small effusions
and may represent atelectasis versus infiltrate.
3. More focal opacity in the left mid lung could represent
asymmetric edema versus underlying developing infiltrate.

## 2020-07-12 DEATH — deceased
